# Patient Record
Sex: Male | Born: 1975 | Race: Black or African American | Hispanic: No | Marital: Single | Smoking: Former smoker
Health system: Southern US, Community
[De-identification: ages and names within clinical notes are randomized; demographics above are authoritative.]

## PROBLEM LIST (undated history)

## (undated) DIAGNOSIS — E119 Type 2 diabetes mellitus without complications: Secondary | ICD-10-CM

## (undated) DIAGNOSIS — Z720 Tobacco use: Secondary | ICD-10-CM

## (undated) DIAGNOSIS — I10 Essential (primary) hypertension: Secondary | ICD-10-CM

---

## 2004-10-26 ENCOUNTER — Emergency Department: Payer: Self-pay | Admitting: Emergency Medicine

## 2005-03-01 ENCOUNTER — Emergency Department: Payer: Self-pay | Admitting: Emergency Medicine

## 2006-02-07 ENCOUNTER — Emergency Department: Payer: Self-pay | Admitting: Emergency Medicine

## 2006-06-11 ENCOUNTER — Emergency Department: Payer: Self-pay | Admitting: Internal Medicine

## 2007-09-24 IMAGING — CR DG CHEST 2V
1 series · 3 of 3 positions shown · non-contrast
Comparison: none

REASON FOR EXAM: Cough
COMMENTS:

PROCEDURE:     DXR - DXR CHEST PA (OR AP) AND LATERAL  - [DATE] [DATE]
RESULT:     The lung fields are clear. The heart, mediastinal and osseous
structures show no significant abnormalities.

[Series 1: view not recorded · 0.17mm/px · 3 of 3 slices shown]
[im 1/3]
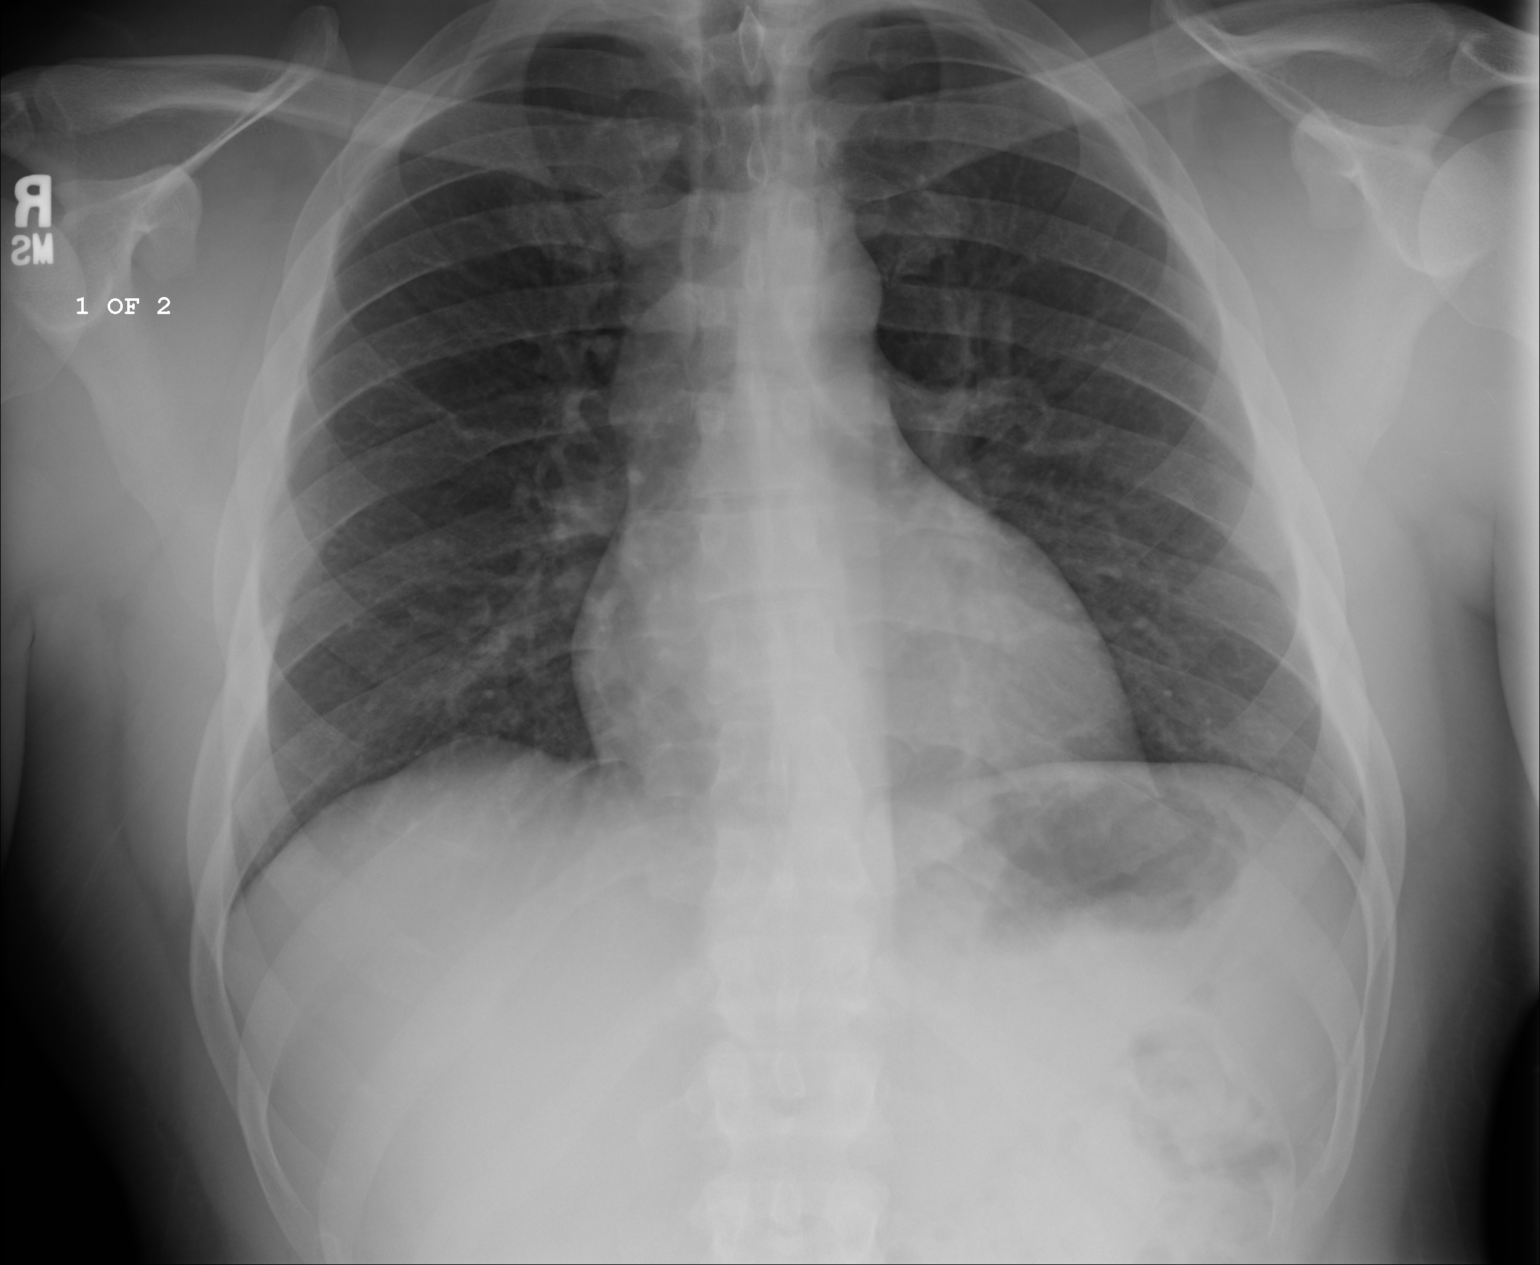
[im 2/3]
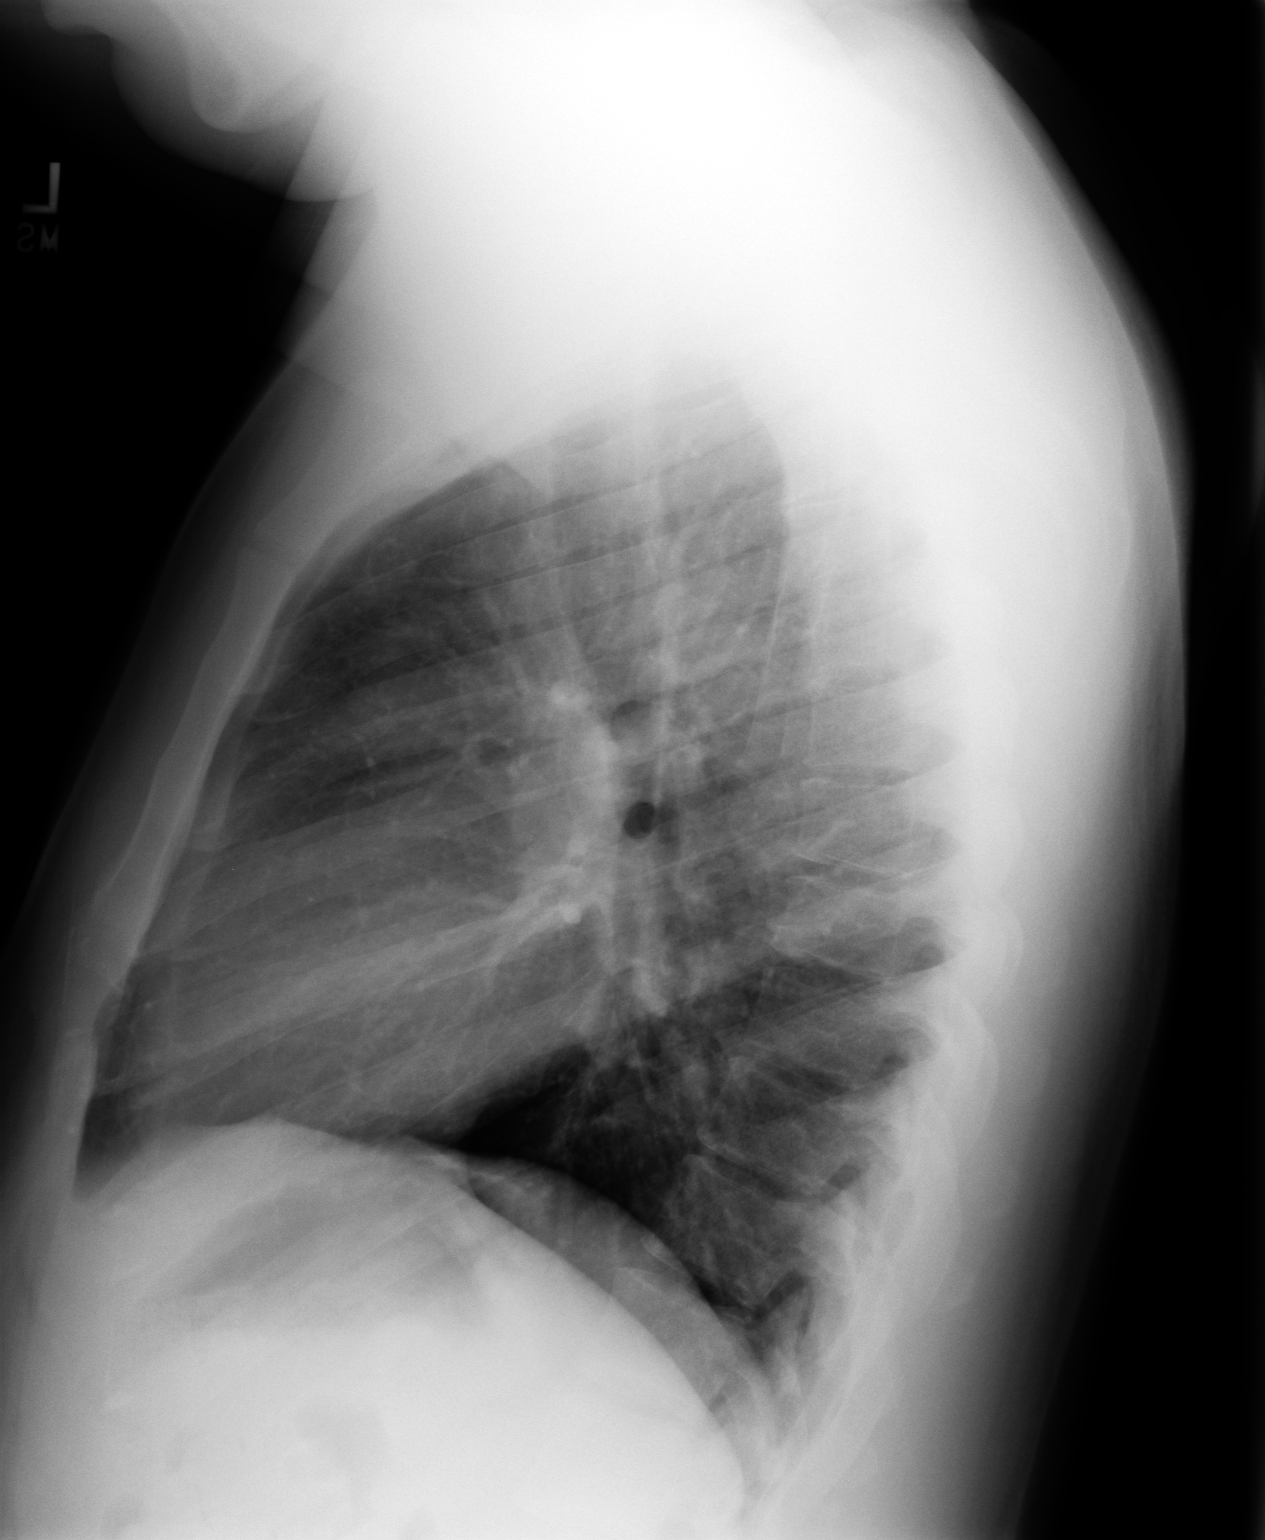
[im 3/3]
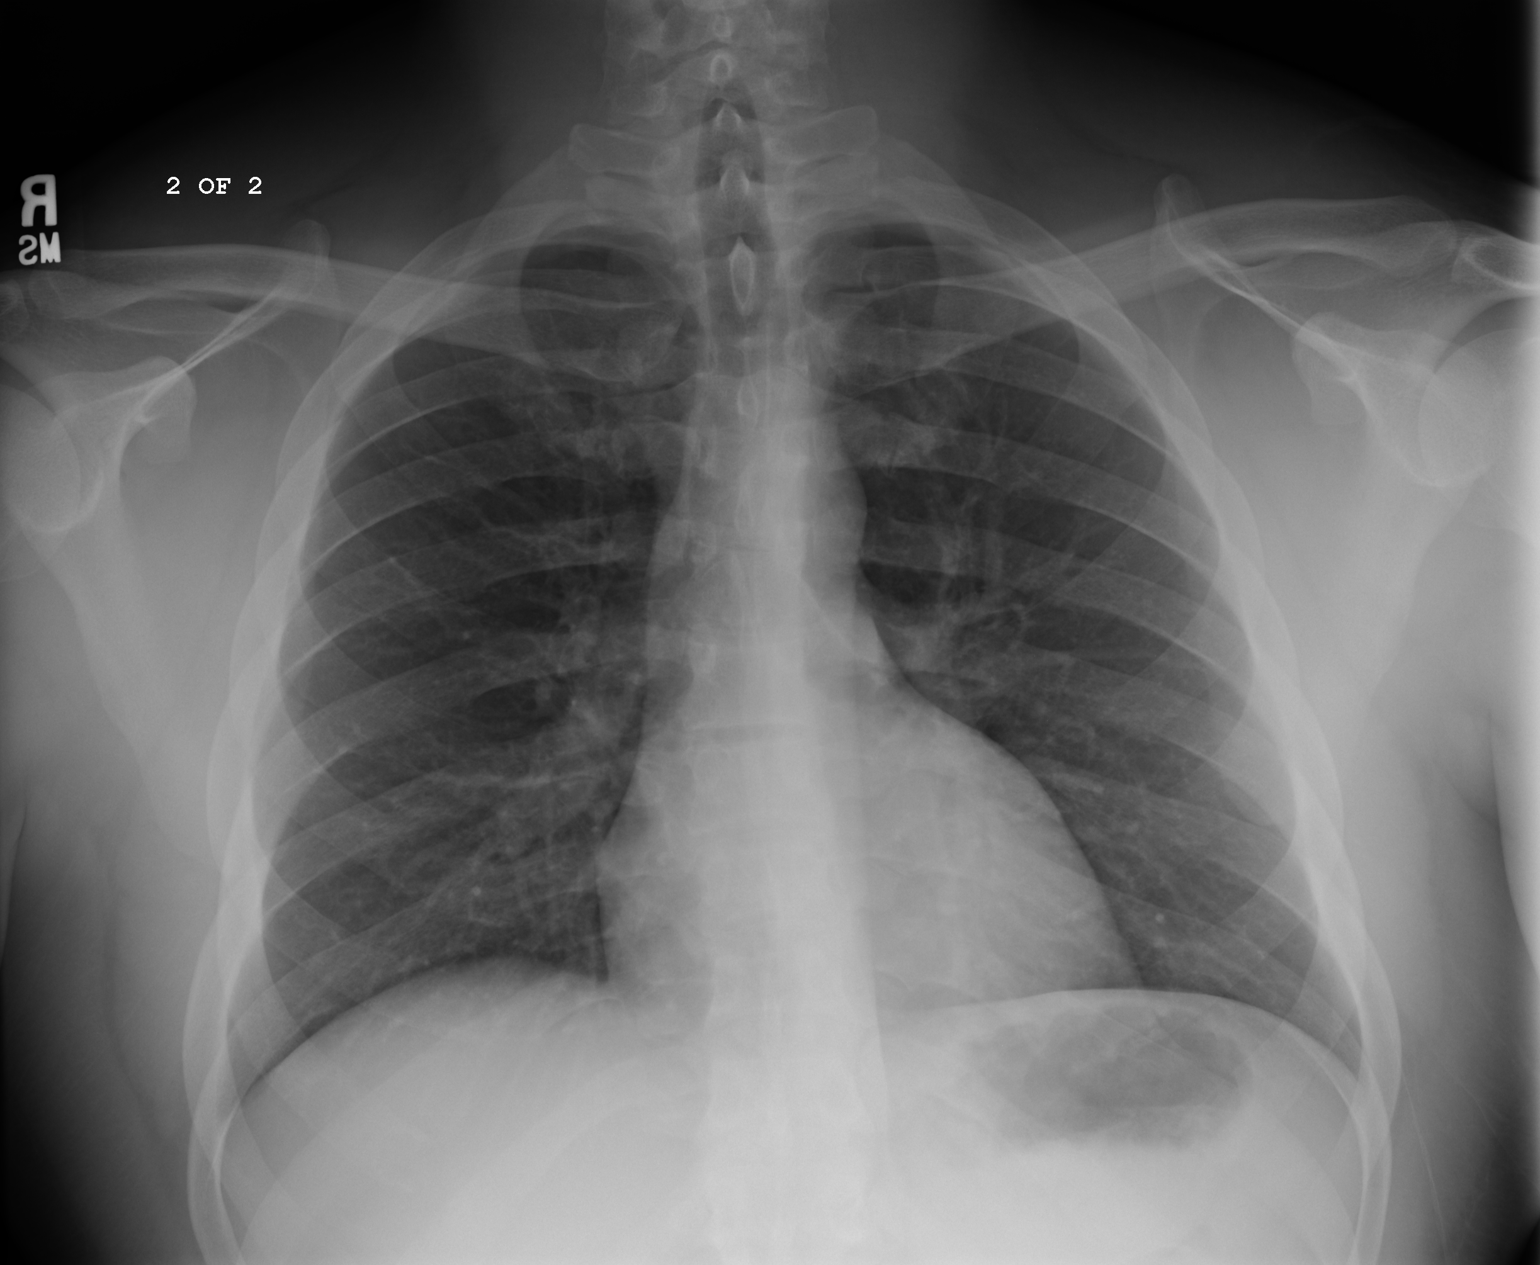

[3 of 3 positions shown; findings below may reference images not displayed]

IMPRESSION: No significant abnormalities are noted.

## 2007-10-01 ENCOUNTER — Emergency Department: Payer: Self-pay | Admitting: Emergency Medicine

## 2008-02-11 ENCOUNTER — Emergency Department: Payer: Self-pay | Admitting: Emergency Medicine

## 2009-03-09 ENCOUNTER — Emergency Department: Payer: Self-pay | Admitting: Emergency Medicine

## 2009-08-24 ENCOUNTER — Emergency Department: Payer: Self-pay | Admitting: Emergency Medicine

## 2010-09-29 ENCOUNTER — Emergency Department: Payer: Self-pay | Admitting: Emergency Medicine

## 2011-09-25 ENCOUNTER — Emergency Department: Payer: Self-pay | Admitting: Emergency Medicine

## 2012-11-25 ENCOUNTER — Emergency Department: Payer: Self-pay | Admitting: Emergency Medicine

## 2013-03-27 ENCOUNTER — Emergency Department: Payer: Self-pay | Admitting: Emergency Medicine

## 2013-03-27 IMAGING — CR RIGHT HAND - COMPLETE 3+ VIEW
1 series · 3 of 3 positions shown · non-contrast
Comparison: none

REASON FOR EXAM: trauma
COMMENTS:   May transport without cardiac monitor

PROCEDURE:     DXR - DXR HAND RT COMPLETE W/OBLIQUES  - [DATE] [DATE]
RESULT:     History: Trauma.
Comparison Study: No prior.

[Series 1: x hand pa right · 0.14mm/px · 3 of 3 slices shown]
[im 1/3]
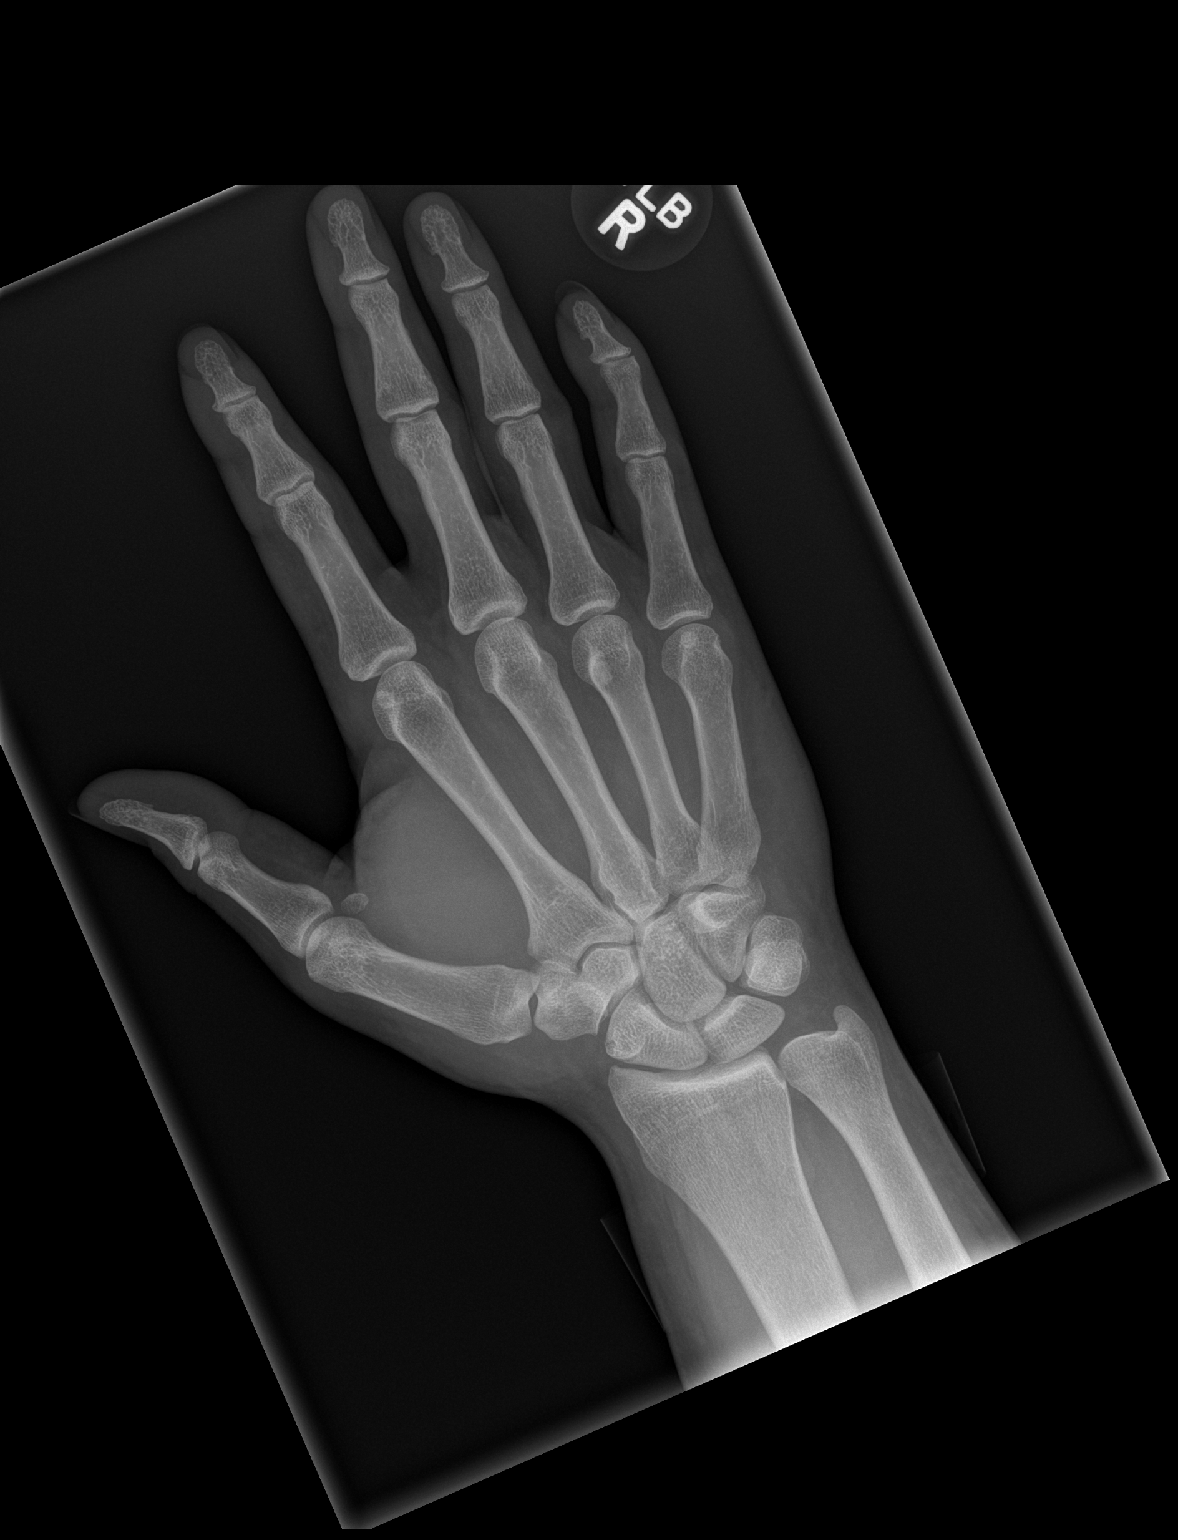
[im 2/3]
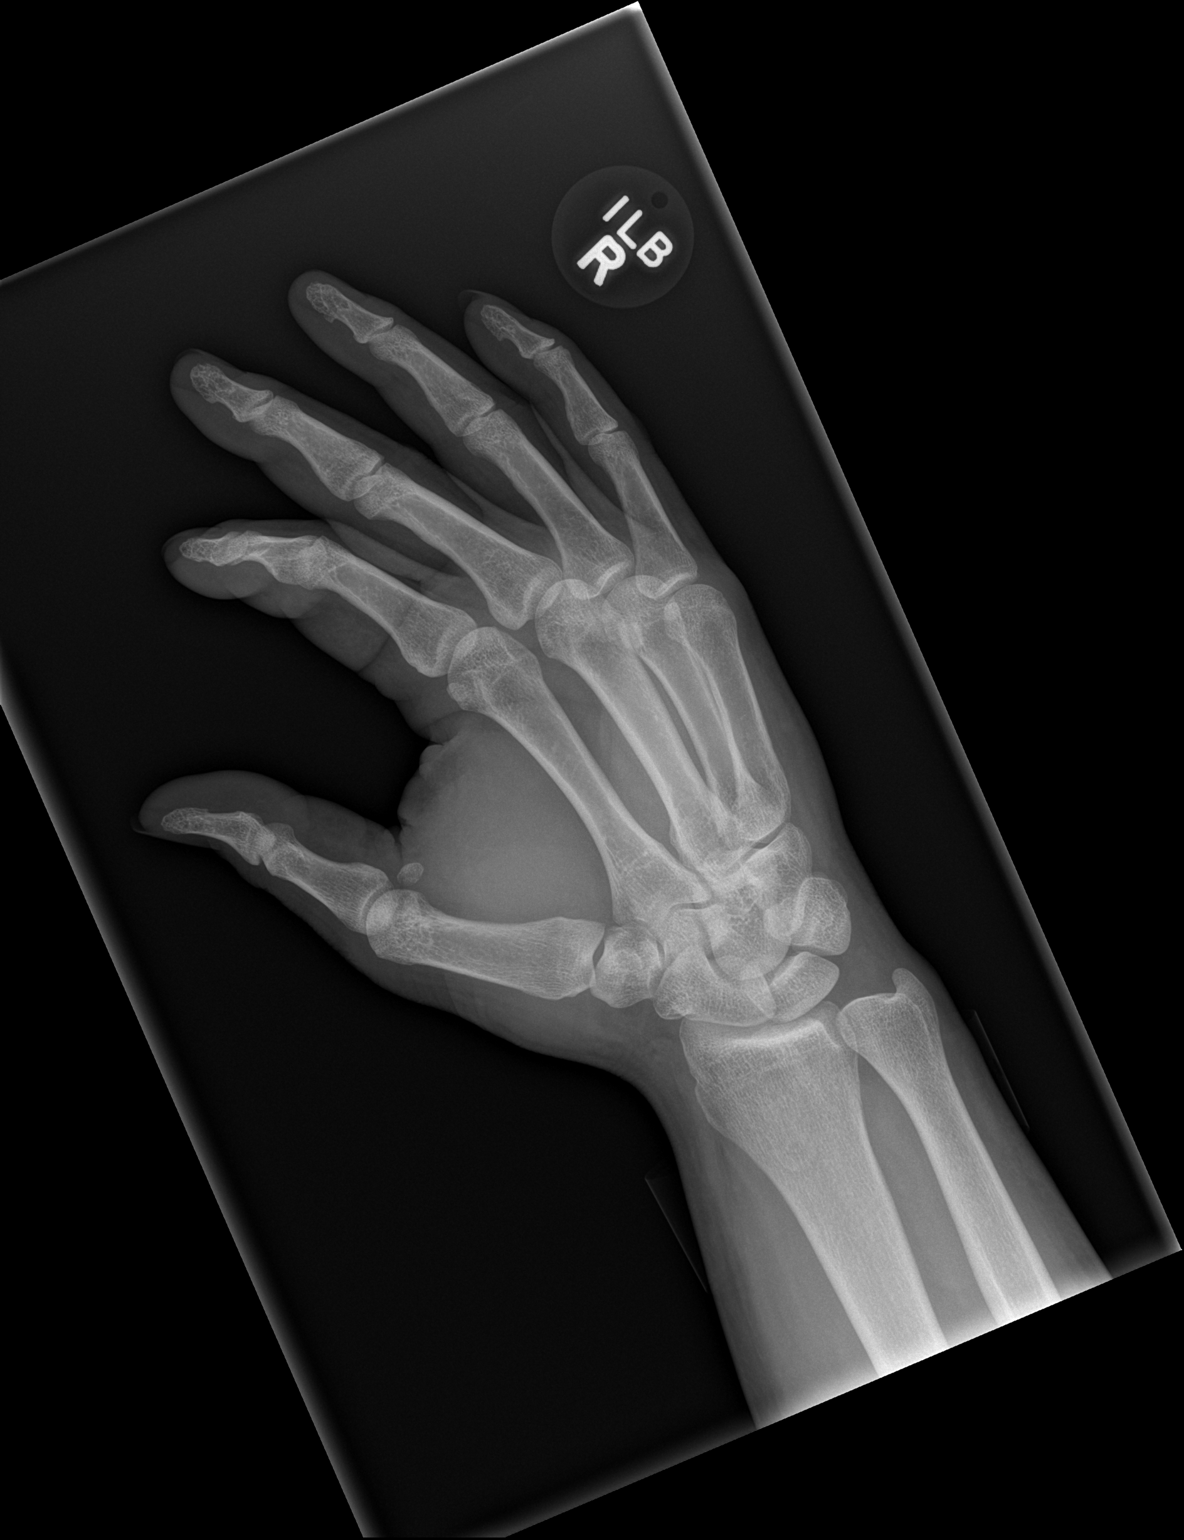
[im 3/3]
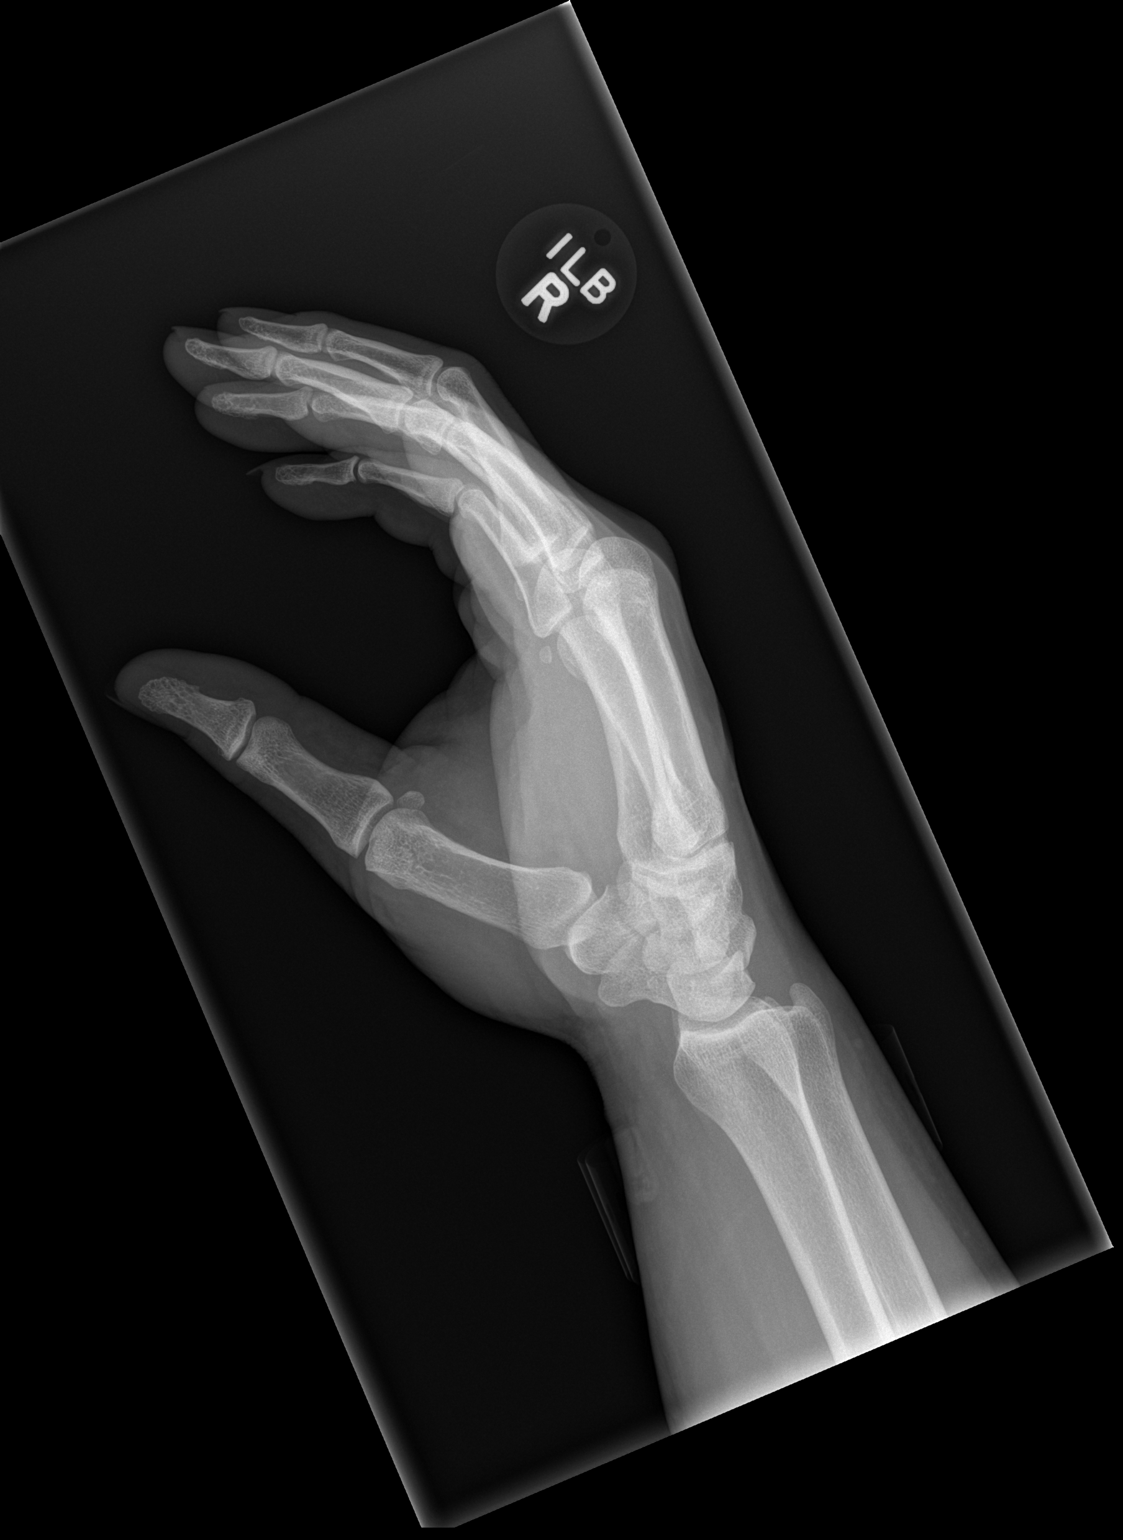

[3 of 3 positions shown; findings below may reference images not displayed]

FINDINGS: No acute bony or joint abnormality.
IMPRESSION: No acute abnormality

## 2013-03-27 IMAGING — CR LEFT WRIST - COMPLETE 3+ VIEW
1 series · 3 of 3 positions shown · non-contrast
Comparison: none

REASON FOR EXAM: wrist pain s/p injury
COMMENTS:

PROCEDURE:     DXR - DXR WRIST LT COMP WITH OBLIQUES  - [DATE] [DATE]
RESULT:     History: Trauma.
Comparison Study: No prior.

[Series 1: x wrist pa left · 0.14mm/px · 3 of 3 slices shown]
[im 1/3]
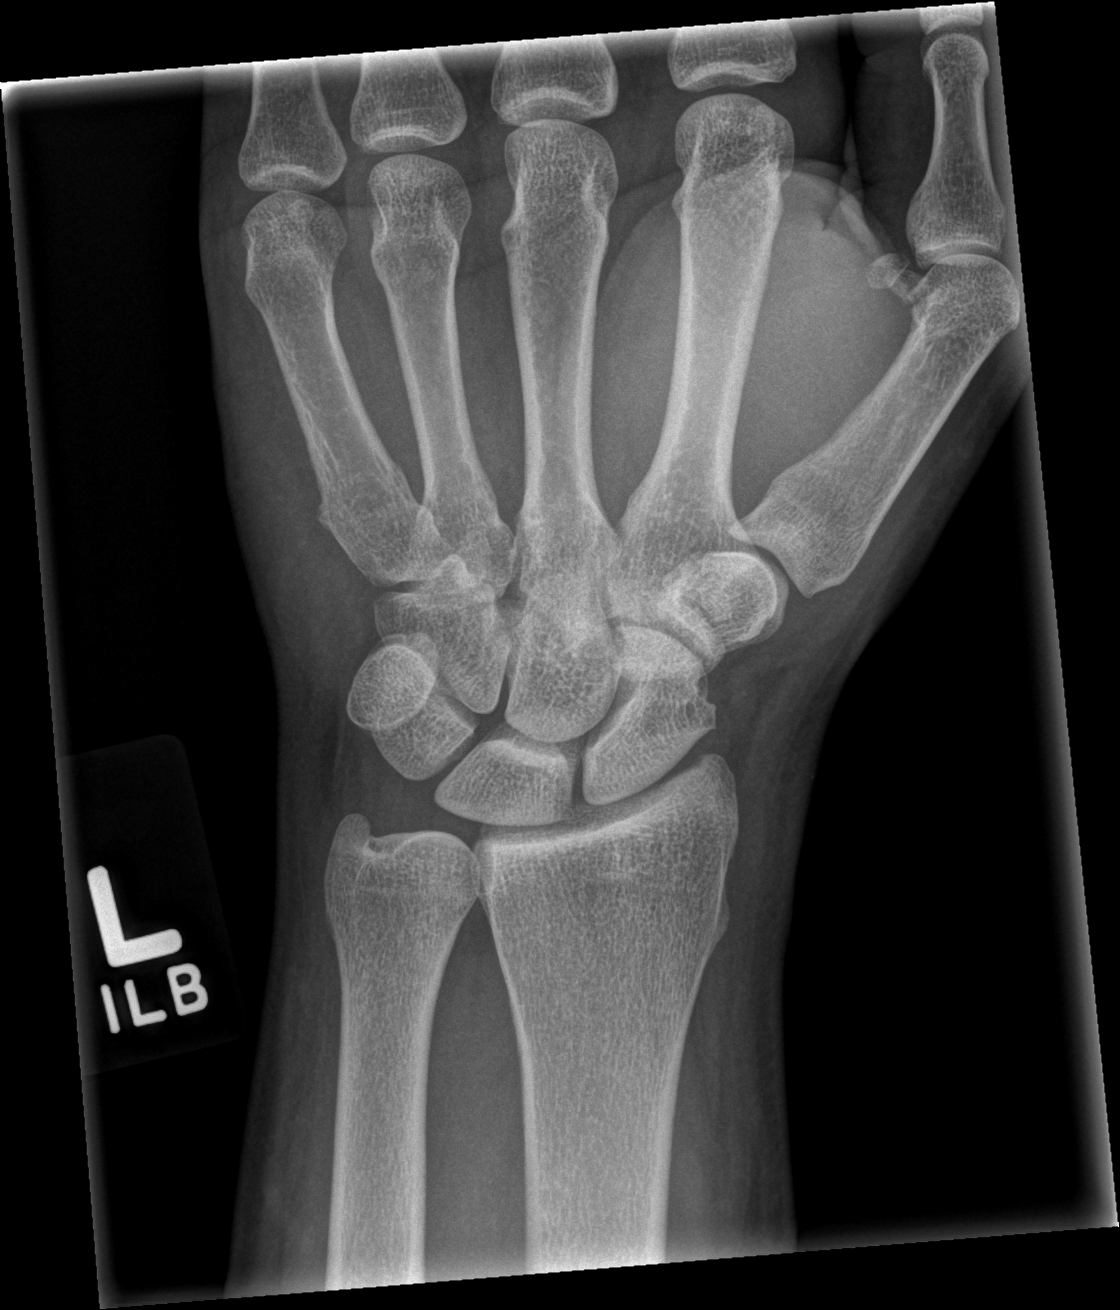
[im 2/3]
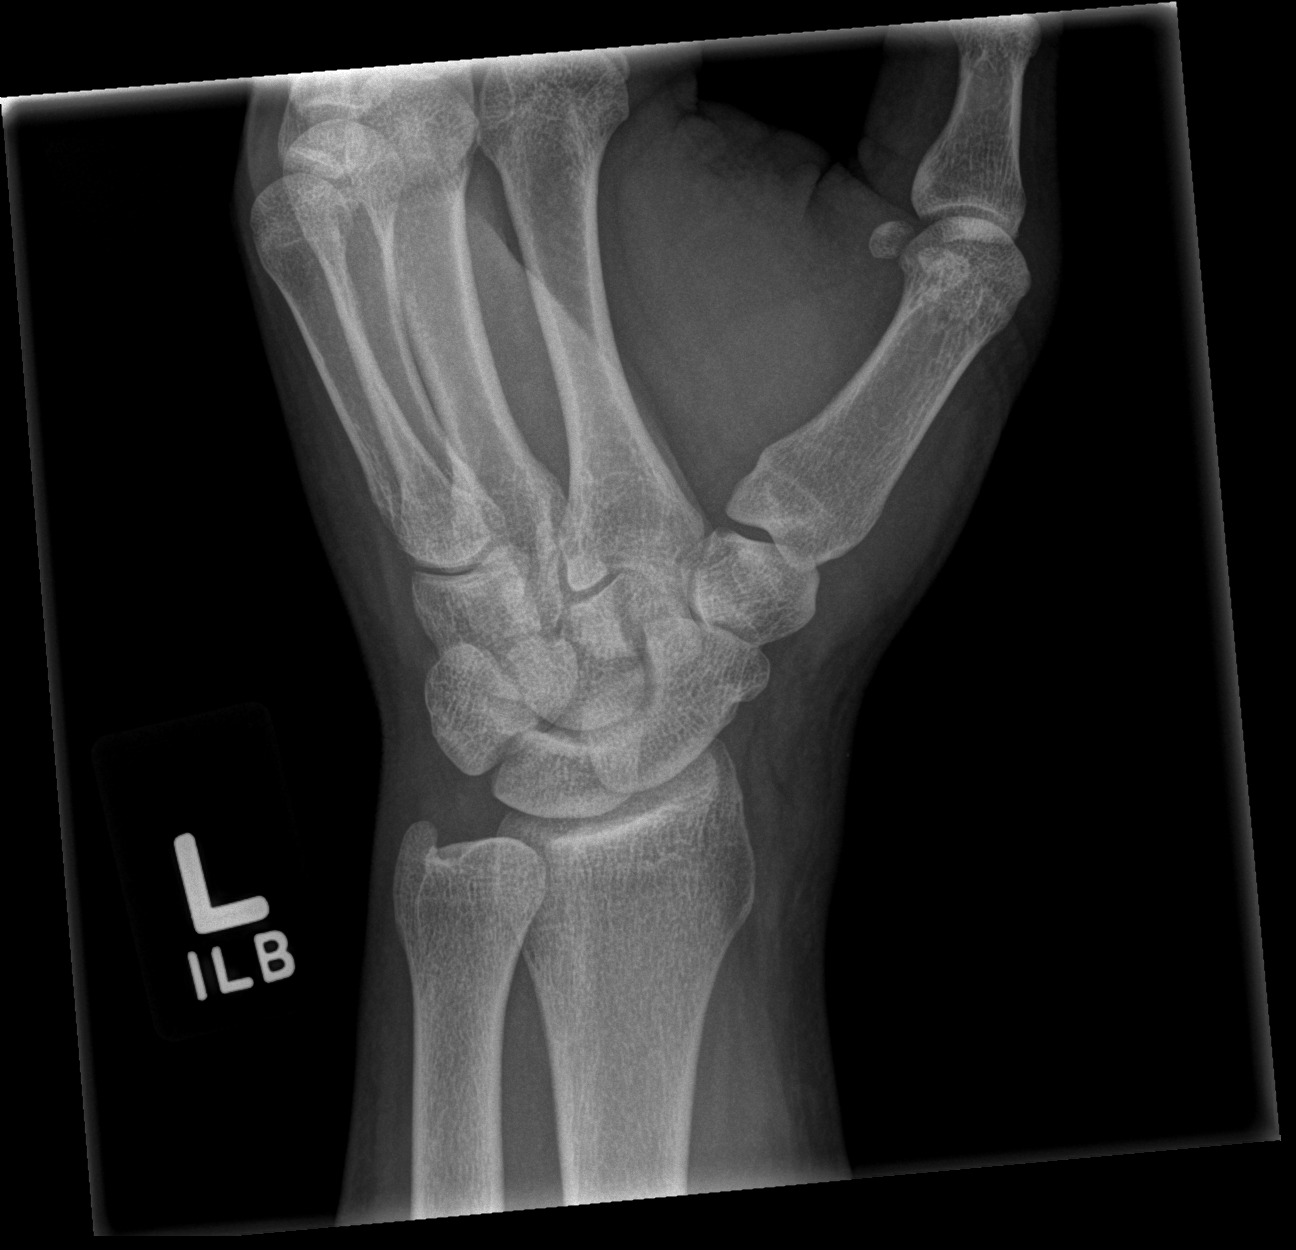
[im 3/3]
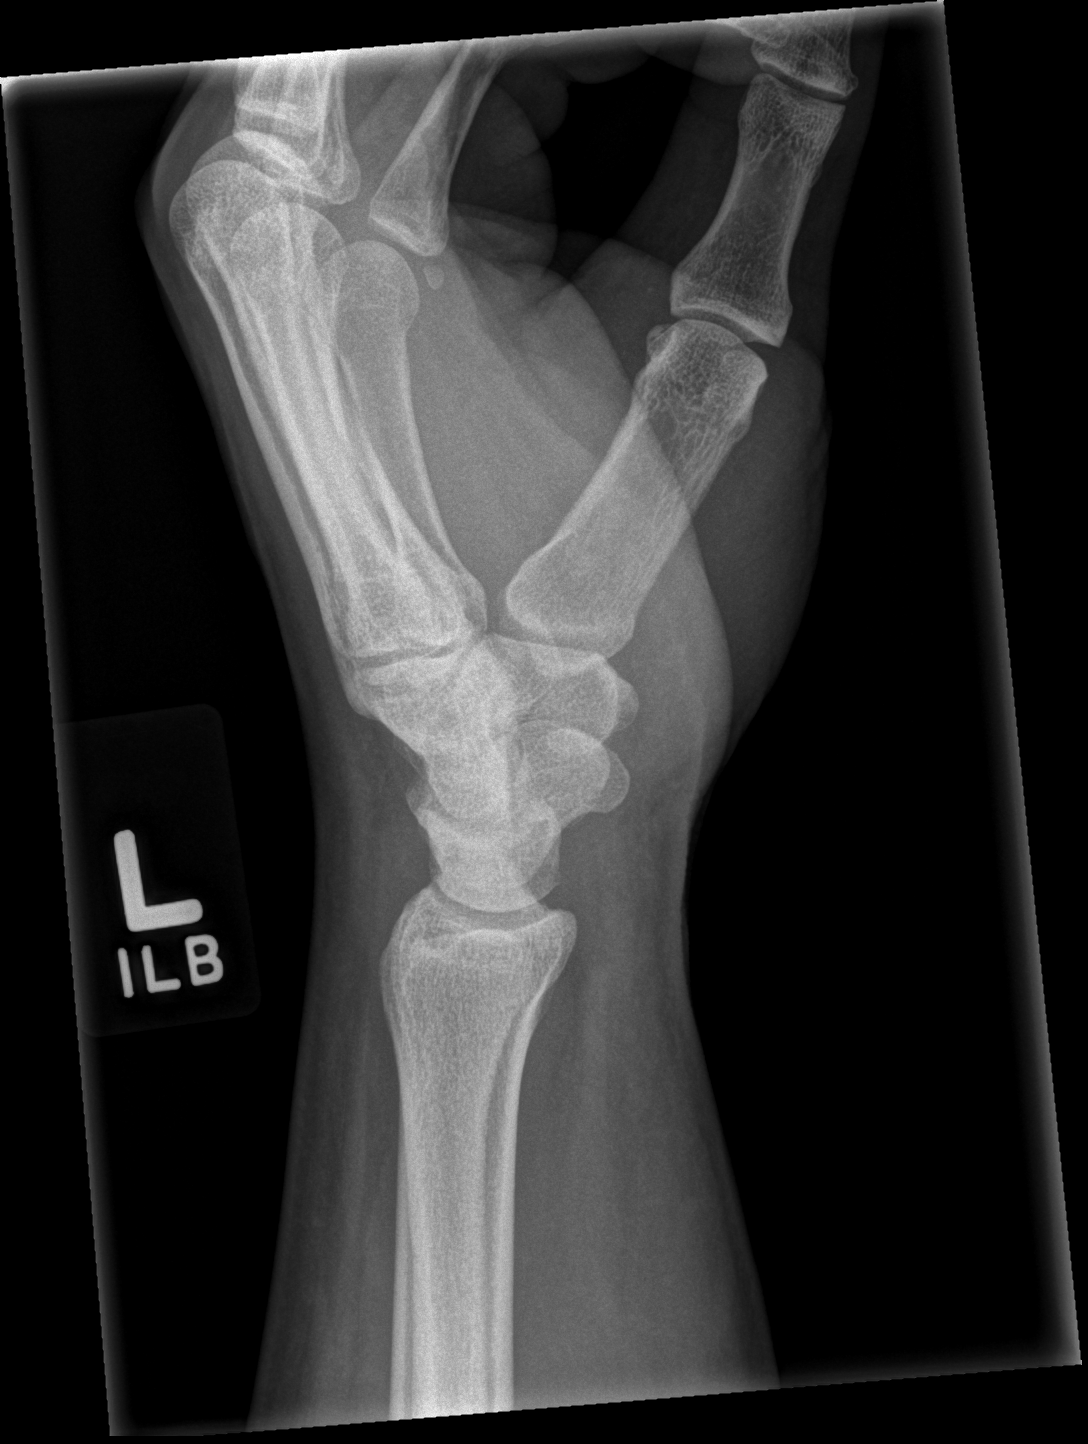

[3 of 3 positions shown; findings below may reference images not displayed]

FINDINGS: No acute soft tissue or bony abnormality. No evidence of fracture.
IMPRESSION: No acute abnormality.

## 2013-03-27 IMAGING — CR DG HAND COMPLETE 3+V*L*
1 series · 3 of 3 positions shown · non-contrast
Comparison: none

REASON FOR EXAM: trauma
COMMENTS:   May transport without cardiac monitor

PROCEDURE:     DXR - DXR HAND LT COMPLETE  W/OBLIQUES  - [DATE] [DATE]
RESULT:     History: Trauma.
Comparison Study: No prior.

[Series 1: x hand pa left · 0.14mm/px · 3 of 3 slices shown]
[im 1/3]
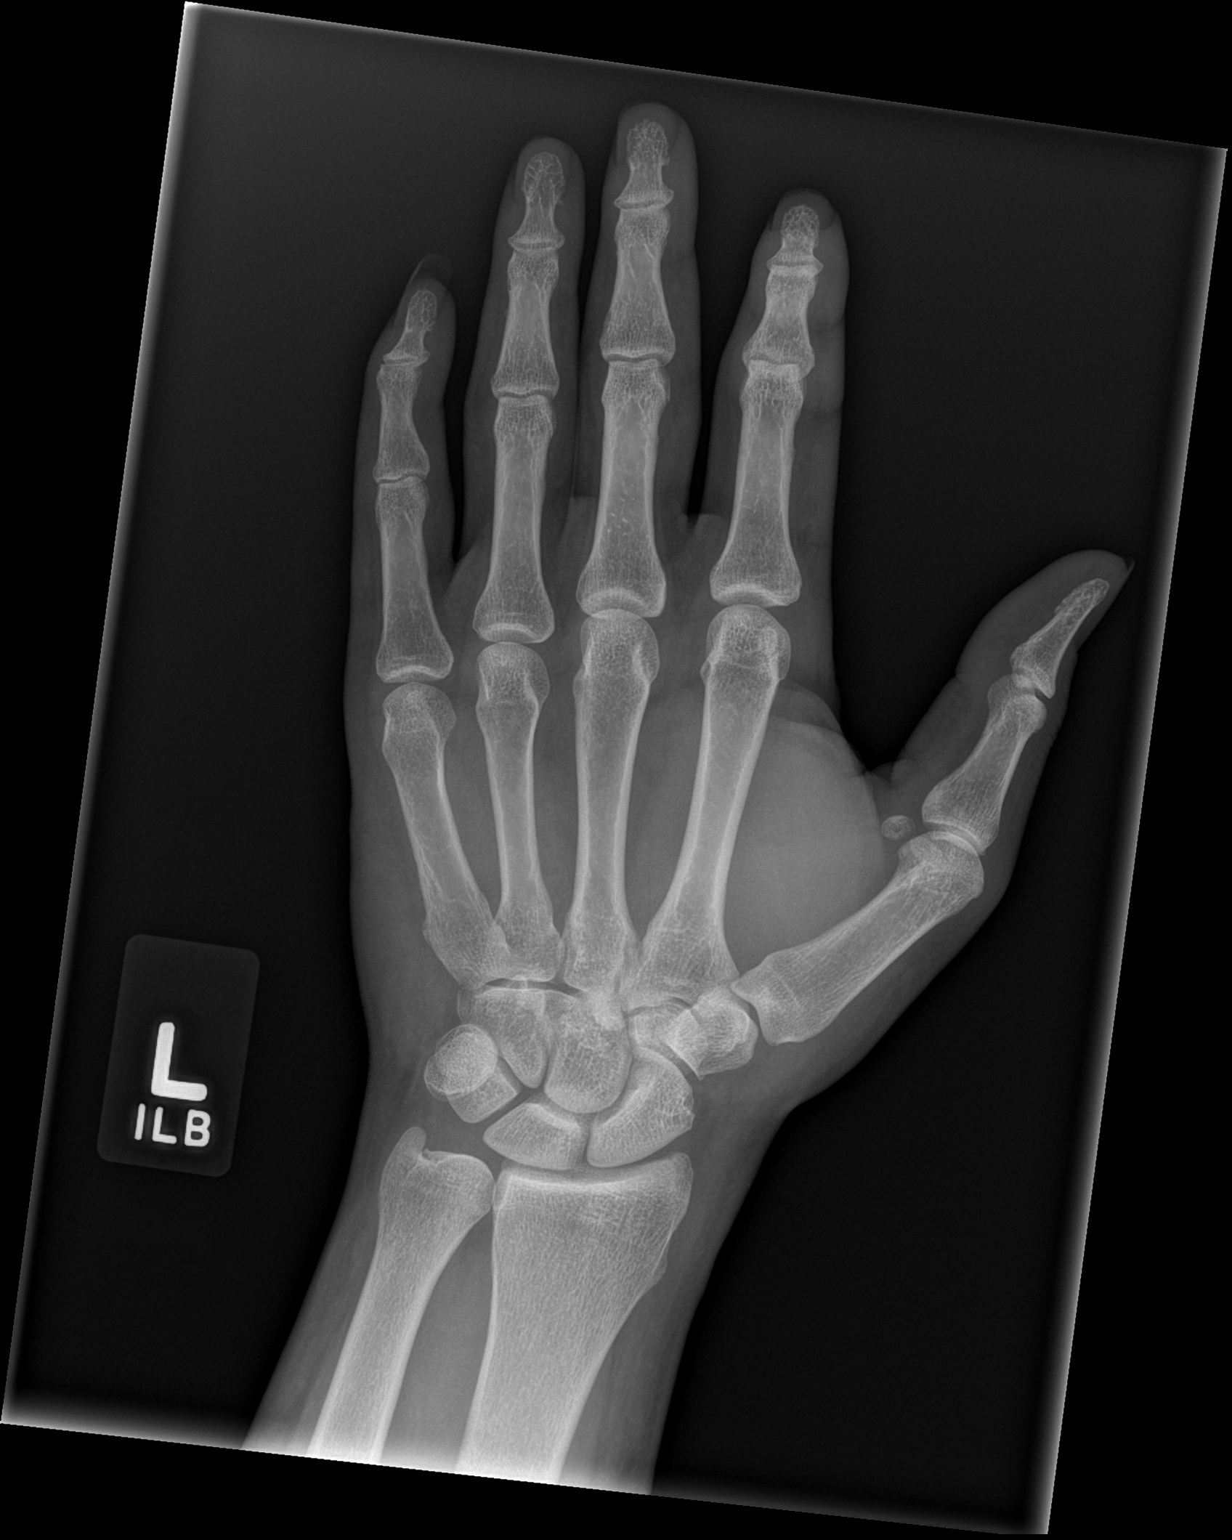
[im 2/3]
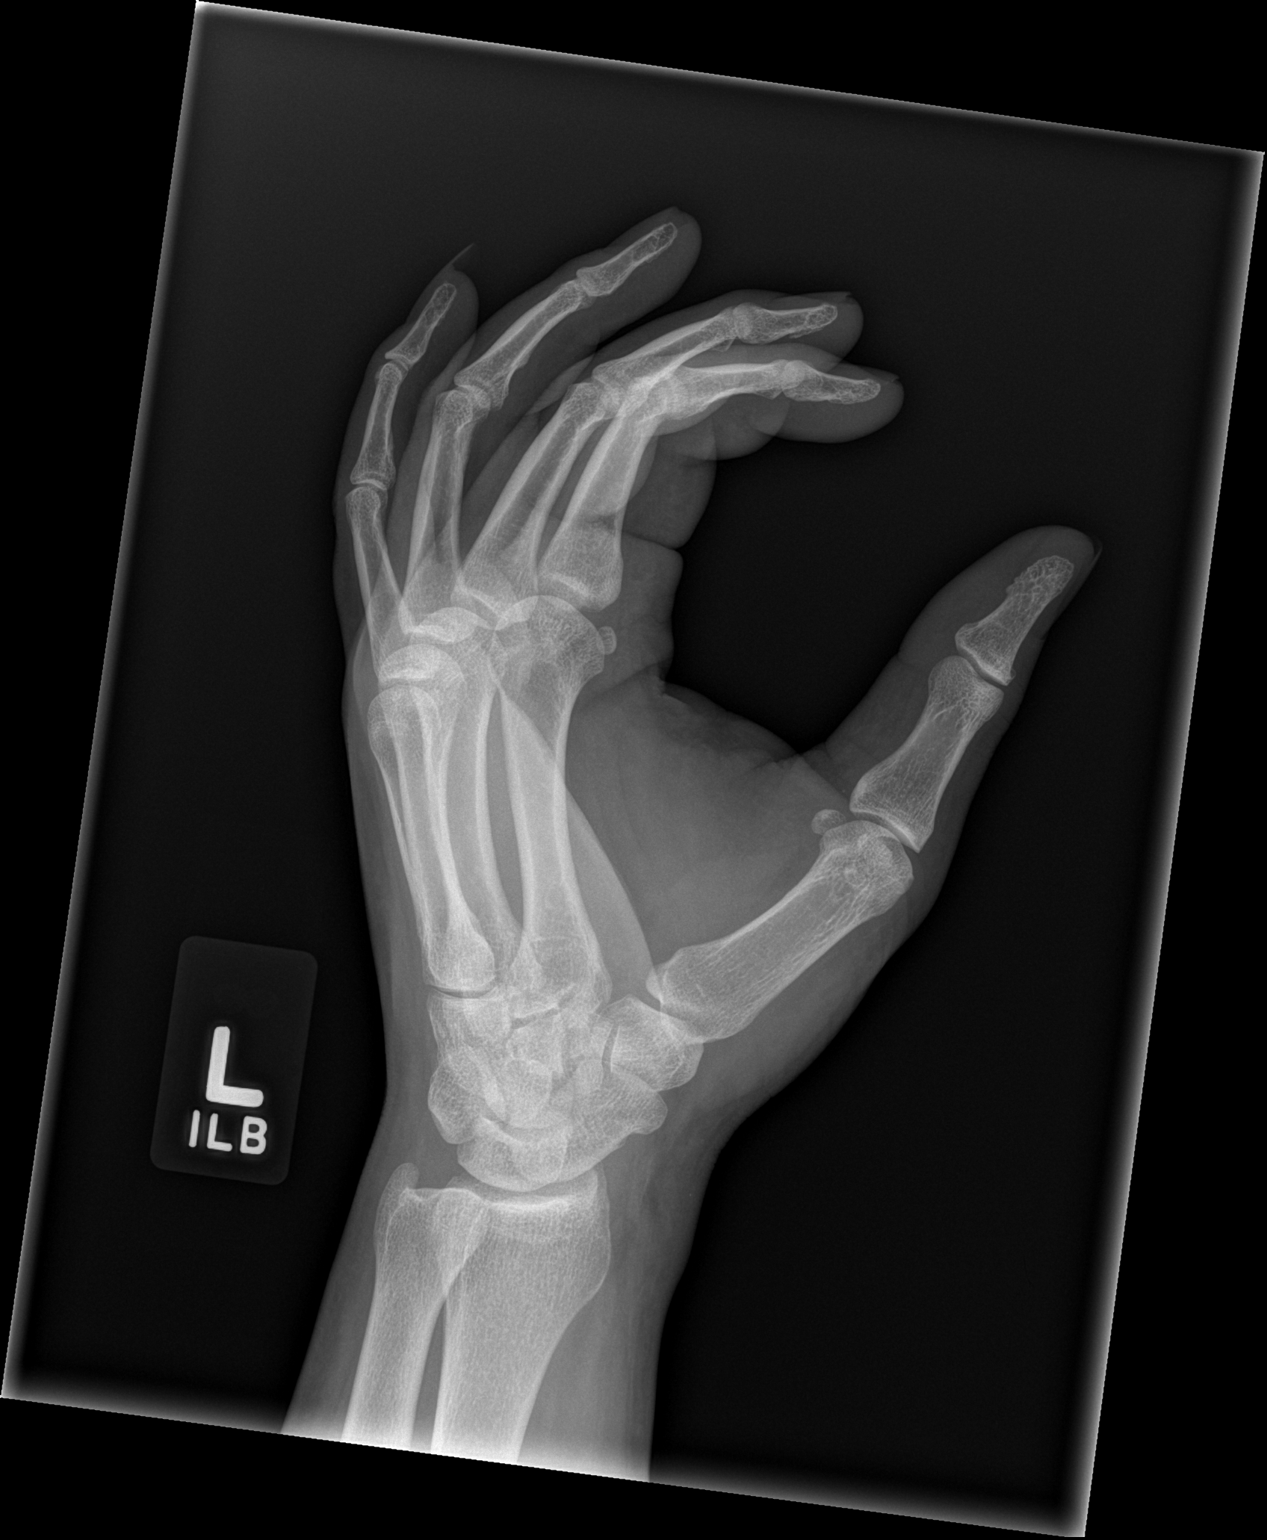
[im 3/3]
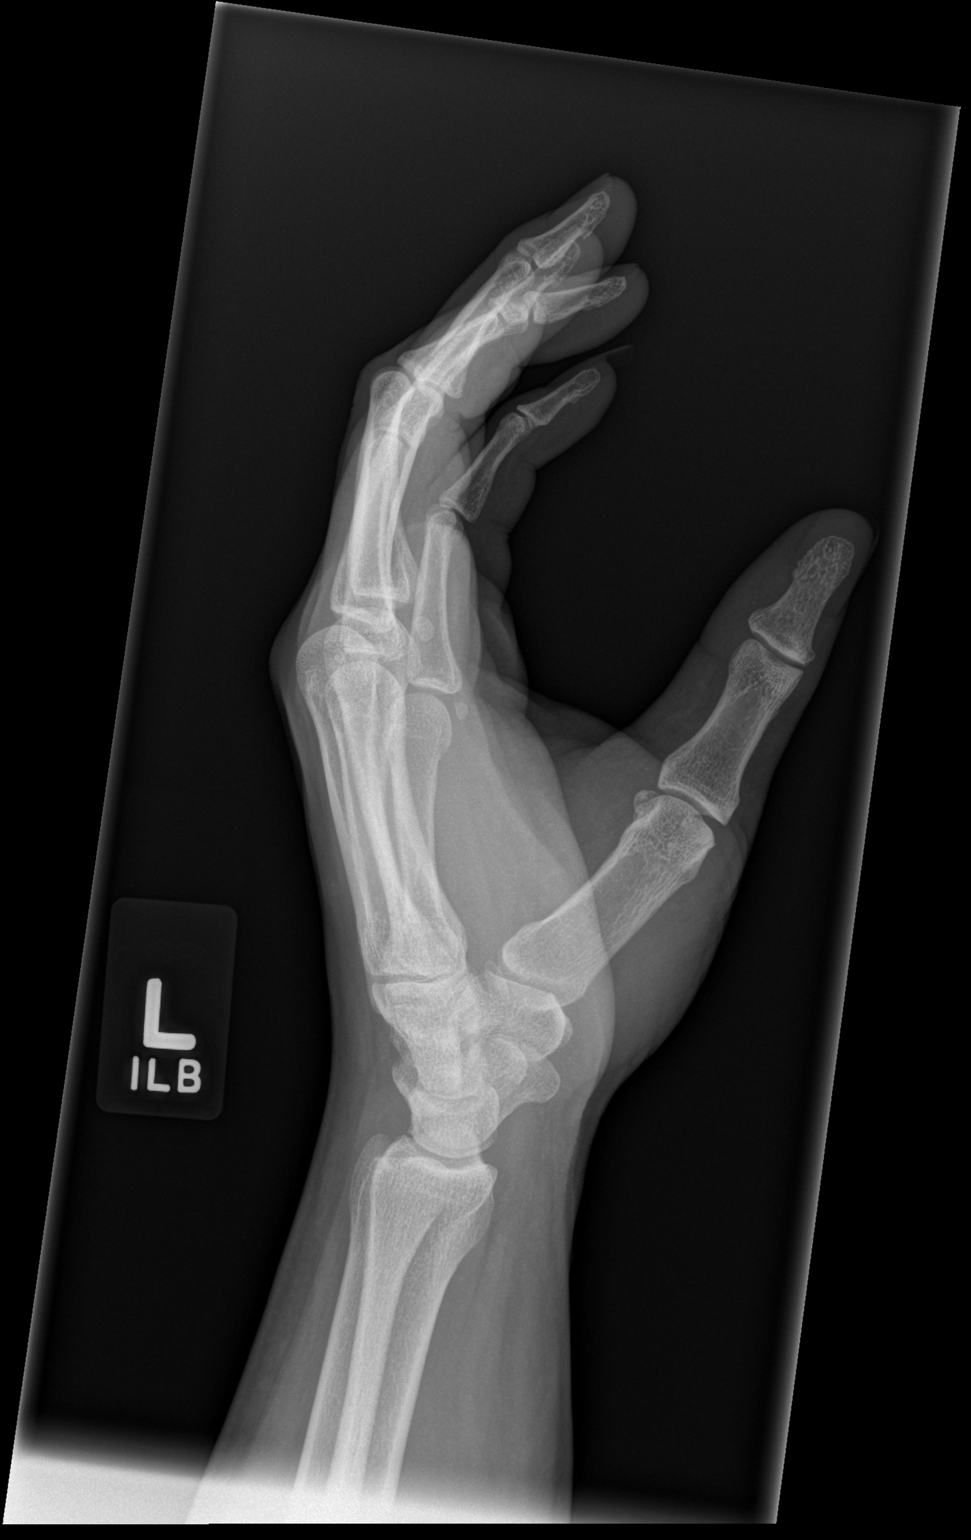

[3 of 3 positions shown; findings below may reference images not displayed]

FINDINGS: No acute bony or joint abnormality. No evidence of fracture or
dislocation.
IMPRESSION: No acute abnormality.

## 2013-06-05 ENCOUNTER — Emergency Department: Payer: Self-pay | Admitting: Emergency Medicine

## 2013-06-05 LAB — CBC
HGB: 12.7 g/dL — ABNORMAL LOW (ref 13.0–18.0)
Platelet: 191 10*3/uL (ref 150–440)
RBC: 5.03 10*6/uL (ref 4.40–5.90)

## 2013-06-05 LAB — COMPREHENSIVE METABOLIC PANEL
Albumin: 4 g/dL (ref 3.4–5.0)
Alkaline Phosphatase: 83 U/L (ref 50–136)
Anion Gap: 5 — ABNORMAL LOW (ref 7–16)
BUN: 7 mg/dL (ref 7–18)
Bilirubin,Total: 0.8 mg/dL (ref 0.2–1.0)
Chloride: 108 mmol/L — ABNORMAL HIGH (ref 98–107)
EGFR (African American): >60
EGFR (Non-African Amer.): >60
Potassium: 3.7 mmol/L (ref 3.5–5.1)
SGOT(AST): 18 U/L (ref 15–37)
SGPT (ALT): 35 U/L (ref 12–78)
Sodium: 139 mmol/L (ref 136–145)
Total Protein: 7.6 g/dL (ref 6.4–8.2)

## 2013-06-05 LAB — URINALYSIS, COMPLETE
Bacteria: NONE SEEN
Bilirubin,UR: NEGATIVE
Ketone: NEGATIVE
Ph: 7 (ref 4.5–8.0)
RBC,UR: 2 /HPF (ref 0–5)
Specific Gravity: 1.028 (ref 1.003–1.030)
Squamous Epithelial: 1

## 2013-06-05 LAB — TROPONIN I: Troponin-I: <0.02 ng/mL

## 2013-06-05 IMAGING — CR DG CHEST 2V
1 series · 2 of 2 positions shown · non-contrast
Comparison: none

REASON FOR EXAM: cough
COMMENTS:

[Series 1: w chest pa · 0.14mm/px · 2 of 2 slices shown]
[im 1/2]
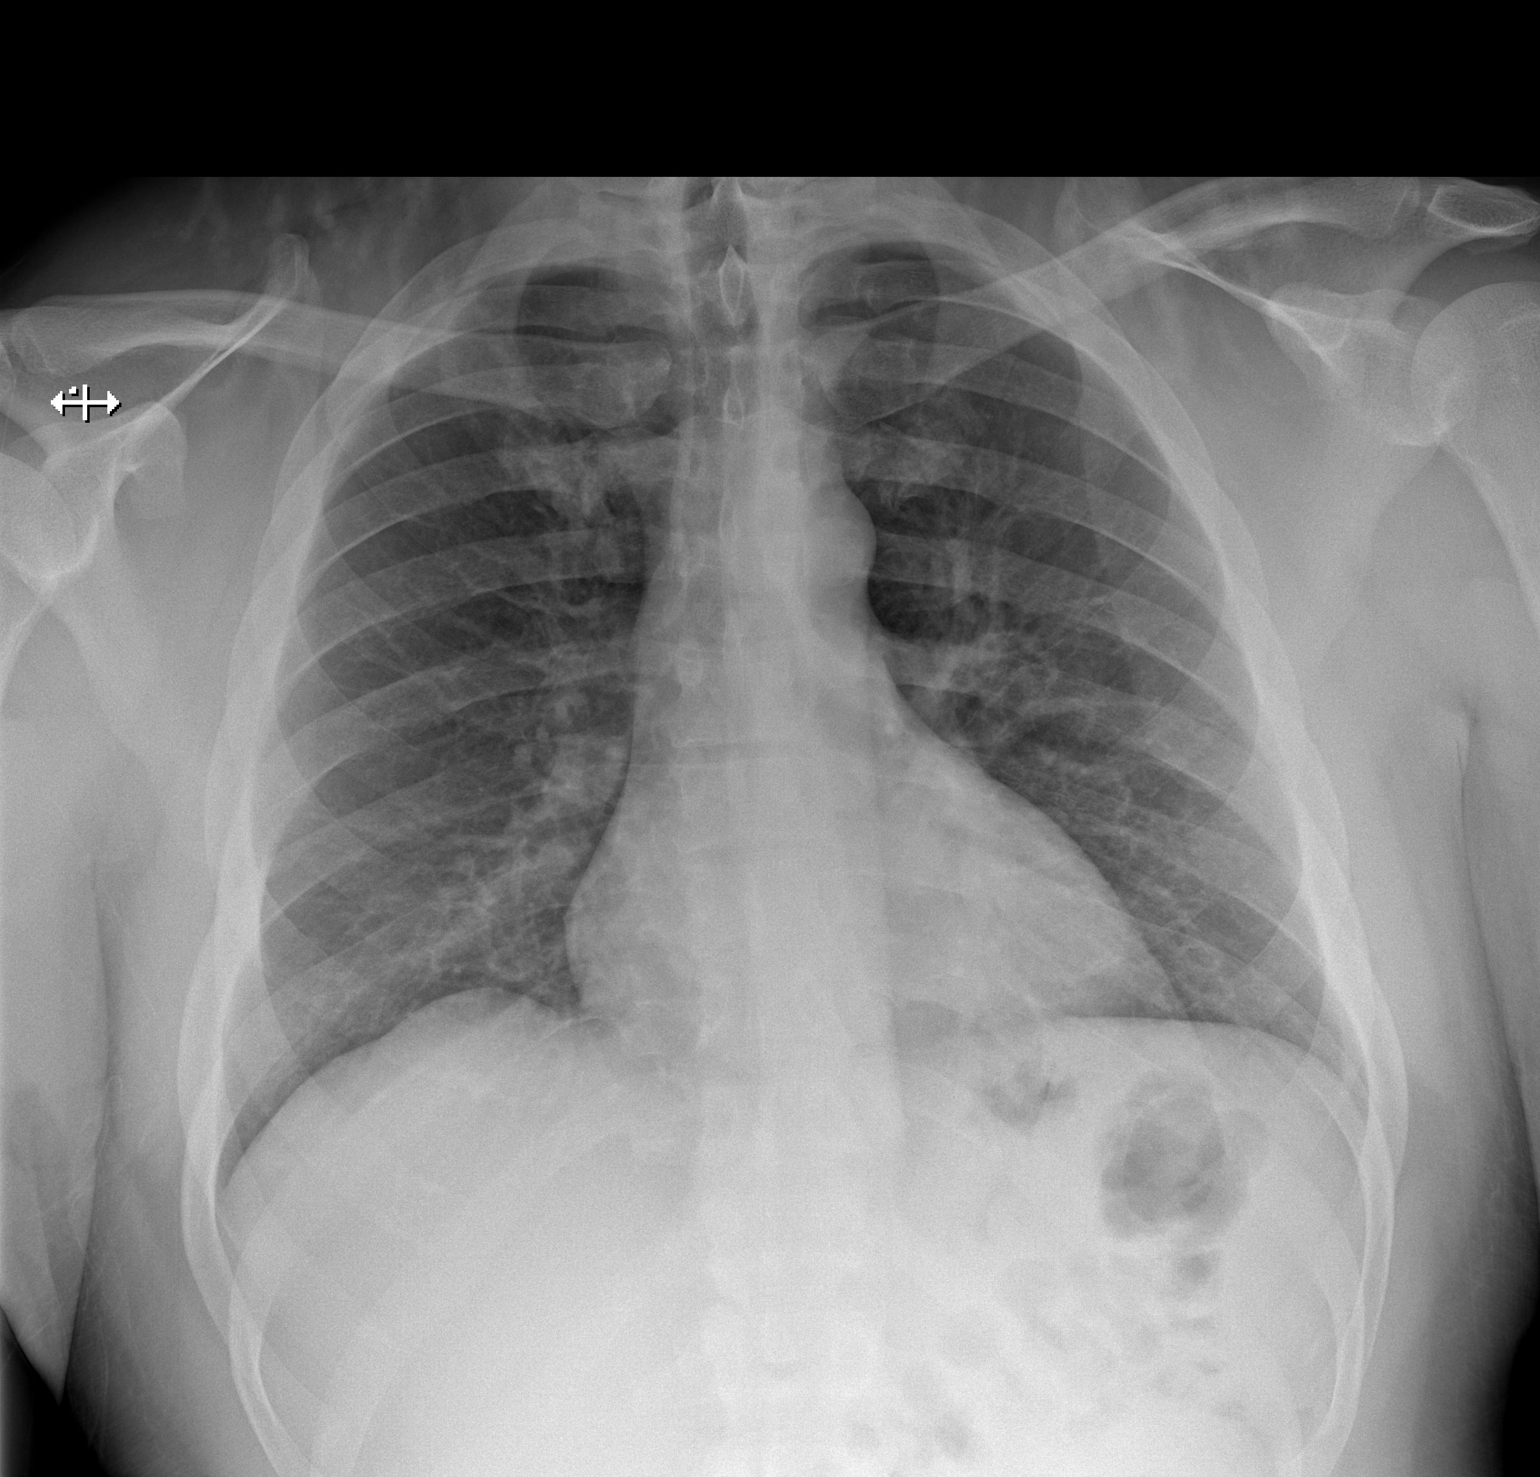
[im 2/2]
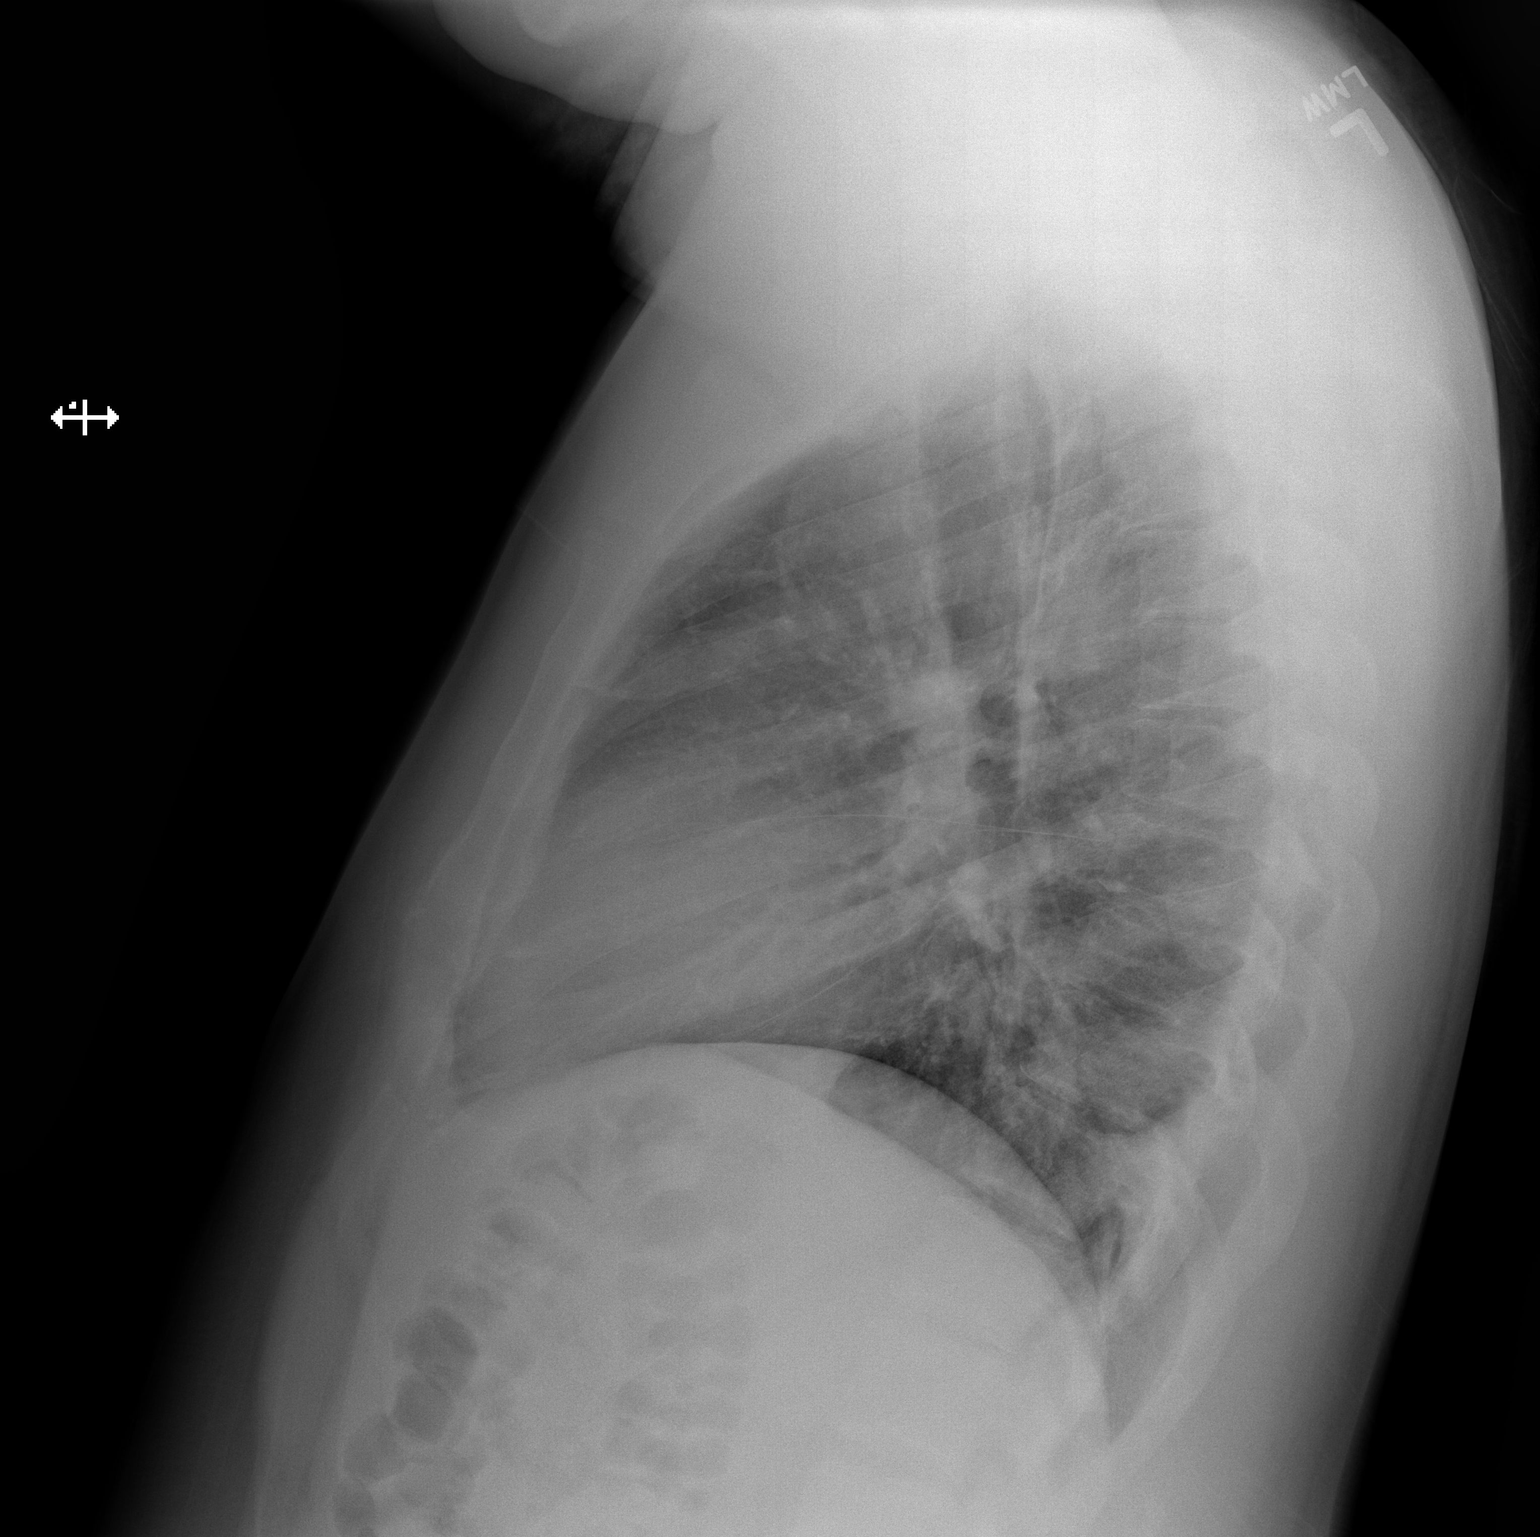

[2 of 2 positions shown; findings below may reference images not displayed]

PROCEDURE:     DXR - DXR CHEST PA (OR AP) AND LATERAL  - [DATE]  [DATE]

RESULT:     There is artifact of the patient's shoulders which could be from
the patient's clothing or from dreadlocks. The lungs are clear. The heart
and pulmonary vessels are normal. The bony and mediastinal structures are
unremarkable. There is no effusion. There is no pneumothorax or evidence of
congestive failure.
IMPRESSION: No acute cardiopulmonary disease.

[REDACTED]

## 2014-12-22 ENCOUNTER — Emergency Department: Payer: Self-pay | Admitting: Emergency Medicine

## 2015-04-16 ENCOUNTER — Emergency Department: Admit: 2015-04-16 | Disposition: A | Discharge status: Home / Self Care | Payer: Self-pay | Admitting: Student

## 2015-04-16 LAB — URINALYSIS, COMPLETE
BACTERIA: NONE SEEN
Bilirubin,UR: NEGATIVE
Blood: NEGATIVE
GLUCOSE, UR: NEGATIVE mg/dL (ref 0–75)
Ketone: NEGATIVE
Leukocyte Esterase: NEGATIVE
NITRITE: NEGATIVE
Ph: 6 (ref 4.5–8.0)
Protein: NEGATIVE
SPECIFIC GRAVITY: 1.025 (ref 1.003–1.030)

## 2015-04-16 LAB — COMPREHENSIVE METABOLIC PANEL
ALK PHOS: 73 U/L
ALT: 18 U/L
AST: 19 U/L
Albumin: 4.3 g/dL
Anion Gap: 5 — ABNORMAL LOW (ref 7–16)
BUN: 10 mg/dL
Bilirubin,Total: 0.7 mg/dL
CREATININE: 1.03 mg/dL
Calcium, Total: 8.7 mg/dL — ABNORMAL LOW
Chloride: 109 mmol/L
Co2: 25 mmol/L
EGFR (African American): >60
EGFR (Non-African Amer.): >60
GLUCOSE: 129 mg/dL — AB
Potassium: 3.7 mmol/L
Sodium: 139 mmol/L
TOTAL PROTEIN: 7.3 g/dL

## 2015-04-16 LAB — CBC WITH DIFFERENTIAL/PLATELET
BASOS ABS: 0 10*3/uL (ref 0.0–0.1)
Basophil %: 0.7 %
Eosinophil #: 0.3 10*3/uL (ref 0.0–0.7)
Eosinophil %: 4.7 %
HCT: 41.8 % (ref 40.0–52.0)
HGB: 12.9 g/dL — ABNORMAL LOW (ref 13.0–18.0)
LYMPHS ABS: 2.8 10*3/uL (ref 1.0–3.6)
Lymphocyte %: 41.1 %
MCH: 25 pg — AB (ref 26.0–34.0)
MCHC: 30.7 g/dL — AB (ref 32.0–36.0)
MCV: 81 fL (ref 80–100)
MONO ABS: 0.5 x10 3/mm (ref 0.2–1.0)
MONOS PCT: 7 %
NEUTROS ABS: 3.2 10*3/uL (ref 1.4–6.5)
Neutrophil %: 46.5 %
PLATELETS: 191 10*3/uL (ref 150–440)
RBC: 5.14 10*6/uL (ref 4.40–5.90)
RDW: 13.6 % (ref 11.5–14.5)
WBC: 6.9 10*3/uL (ref 3.8–10.6)

## 2015-07-29 ENCOUNTER — Emergency Department: Payer: Self-pay

## 2015-07-29 ENCOUNTER — Emergency Department
Admission: EM | Admit: 2015-07-29 | Discharge: 2015-07-29 | Disposition: A | Discharge status: Home / Self Care | Payer: Self-pay | Attending: Emergency Medicine | Admitting: Emergency Medicine

## 2015-07-29 ENCOUNTER — Encounter: Payer: Self-pay | Admitting: Emergency Medicine

## 2015-07-29 DIAGNOSIS — M79641 Pain in right hand: Secondary | ICD-10-CM | POA: Insufficient documentation

## 2015-07-29 DIAGNOSIS — R609 Edema, unspecified: Secondary | ICD-10-CM | POA: Insufficient documentation

## 2015-07-29 IMAGING — CR DG HAND COMPLETE 3+V*R*
1 series · 3 of 3 positions shown · non-contrast
Comparison: None.

CLINICAL DATA: Injury pain.  Initial evaluation.

EXAM:
RIGHT HAND - COMPLETE 3+ VIEW

[Series 1: pa · 0.17mm/px · 3 of 3 slices shown]
[im 1/3]
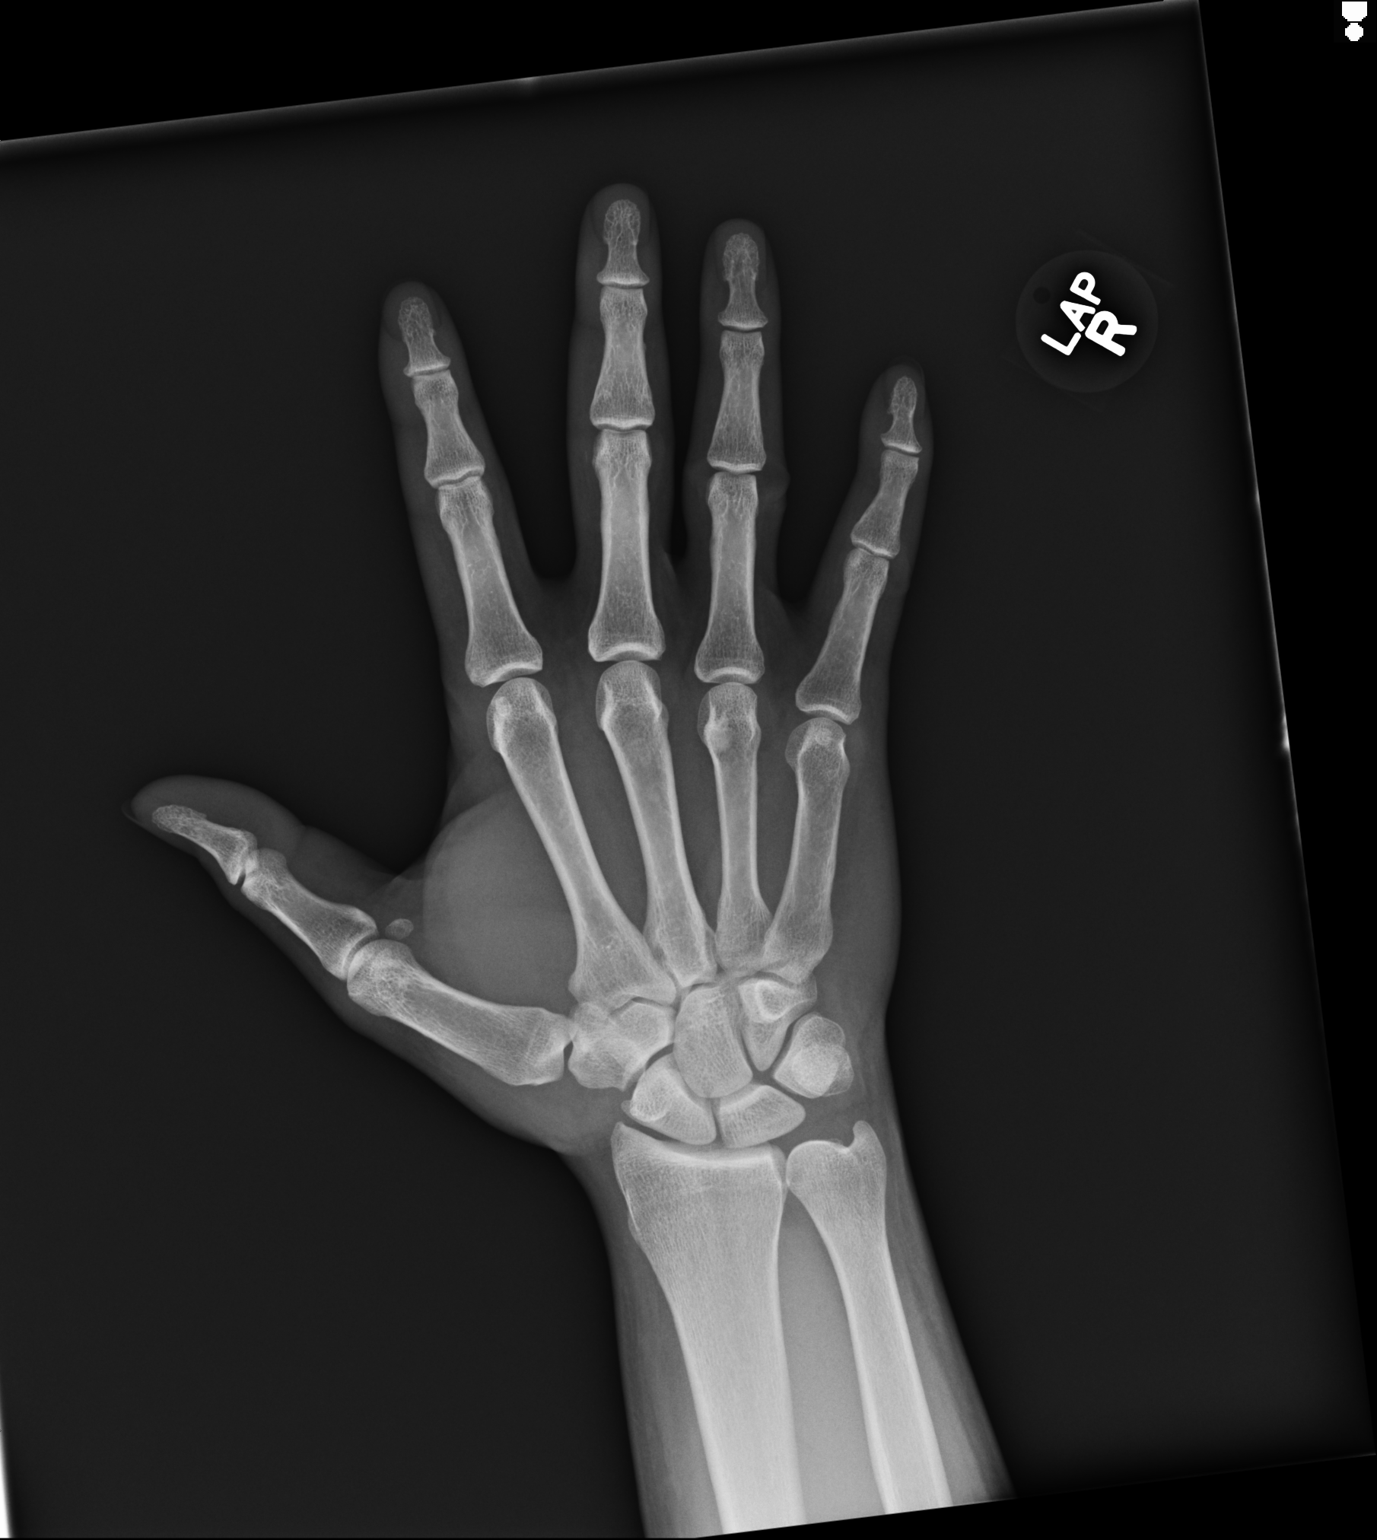
[im 2/3]
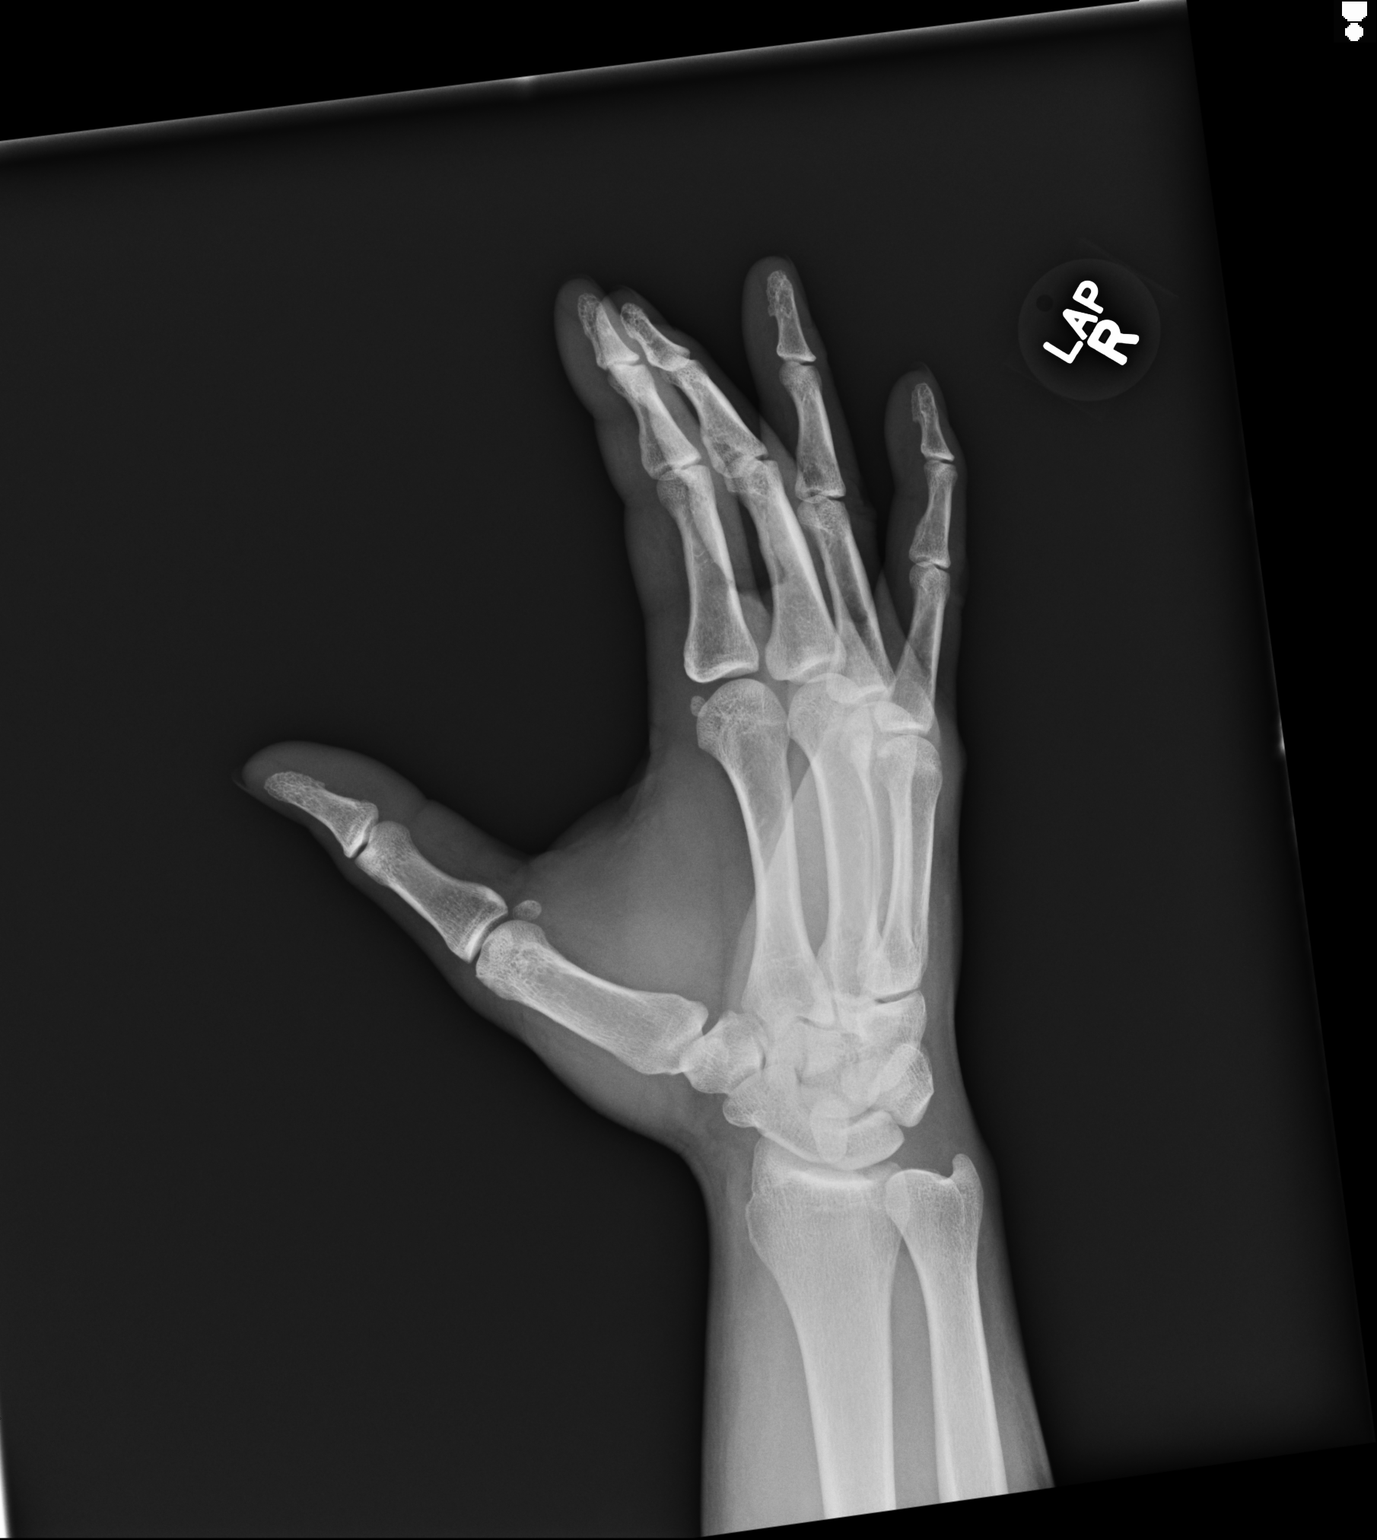
[im 3/3]
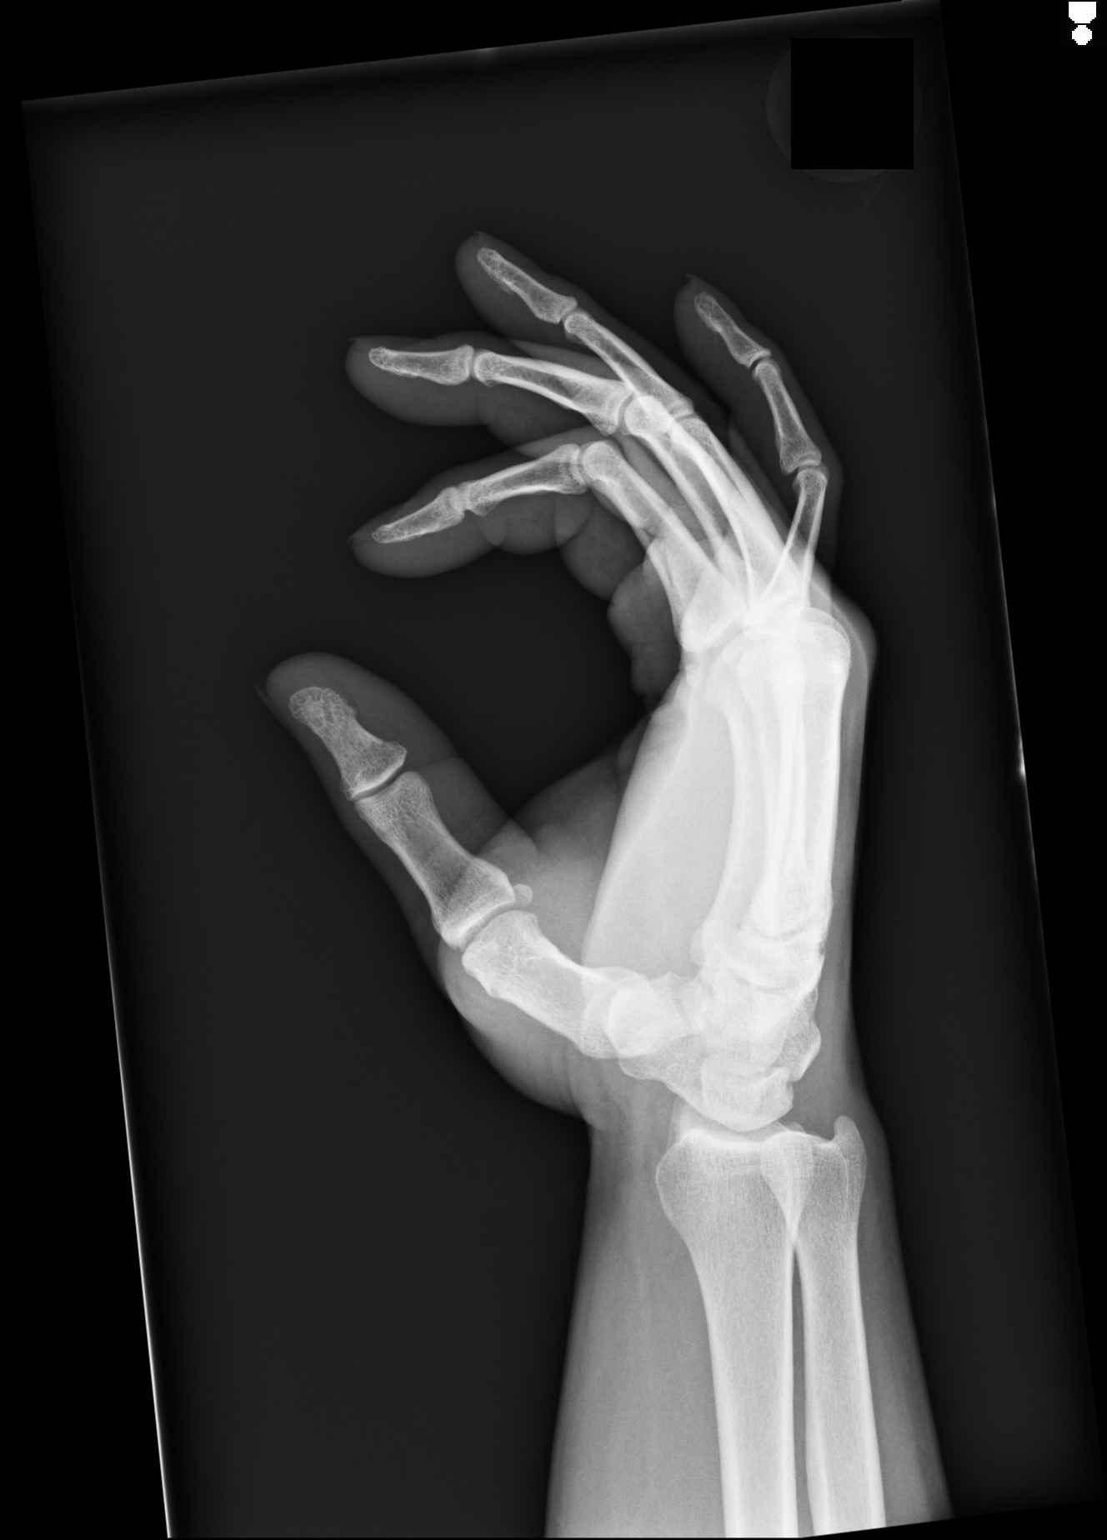

[3 of 3 positions shown; findings below may reference images not displayed]

FINDINGS: There is no evidence of fracture or dislocation. There is no
evidence of arthropathy or other focal bone abnormality. Soft
tissues are unremarkable.
IMPRESSION: No acute abnormality.

## 2015-07-29 MED ORDER — INDOMETHACIN 25 MG PO CAPS: 25.0000 mg | ORAL_CAPSULE | Freq: Three times a day (TID) | ORAL | Status: DC

## 2015-07-29 NOTE — Discharge Instructions (Signed)
FOLLOW UP WITH DR. MENZ IF ANY CONTINUED PROBLEMS  INDOCIN WITH FOOD

## 2015-07-29 NOTE — ED Notes (Signed)
Patient reports waking this morning with right hand pain, reports he is unsure if he slept wrong or not. Told to come here by his employer.

## 2015-07-29 NOTE — ED Provider Notes (Signed)
[facility] Emergency Department Provider Note  ____________________________________________  Time seen: Approximately 12:27 PM  I have reviewed the triage vital signs and the nursing notes.   HISTORY  Chief Complaint Hand Pain   HPI KENSTON LONGTON is a 39 y.o. male is here today with complaint of right hand pain. He states that he is unaware of any injury. He states he woke this morning with pain and swelling. He has not taken any over-the-counter medication. He denies any previous injury to his hand. Currently his pain is an 8 out of 10. Making a fist increases his pain and he is not able to completely make a fist. Nothing has helped his pain.  History reviewed. No pertinent past medical history.  There are no active problems to display for this patient.   History reviewed. No pertinent past surgical history.  Current Outpatient Rx  Name  Route  Sig  Dispense  Refill  . indomethacin (INDOCIN) 25 MG capsule   Oral   Take 1 capsule (25 mg total) by mouth 3 (three) times daily with meals.   15 capsule   0     Allergies Review of patient's allergies indicates no known allergies.  History reviewed. No pertinent family history.  Social History Social History  Substance Use Topics  . Smoking status: Never Smoker   . Smokeless tobacco: None  . Alcohol Use: No    Review of Systems Constitutional: No fever/chills Cardiovascular: Denies chest pain. Respiratory: Denies shortness of breath. Gastrointestinal: No abdominal pain.  No nausea, no vomiting.  Genitourinary: Negative for dysuria. Musculoskeletal: Negative for back pain. Skin: Negative for rash. Neurological: Negative for headaches, focal weakness or numbness.  10-point ROS otherwise negative.  ____________________________________________   PHYSICAL EXAM:  VITAL SIGNS: ED Triage Vitals  Enc Vitals Group     BP 07/29/15 1115 145/99 mmHg     Pulse Rate 07/29/15 1115 74     Resp  07/29/15 1115 20     Temp 07/29/15 1115 98.6 F (37 C)     Temp Source 07/29/15 1115 Oral     SpO2 07/29/15 1115 99 %     Weight 07/29/15 1115 215 lb (97.523 kg)     Height 07/29/15 1115  (1.88 m)     Head Cir --      Peak Flow --      Pain Score 07/29/15 1116 8     Pain Loc --      Pain Edu? --      Excl. in GC? --     Constitutional: Alert and oriented. Well appearing and in no acute distress. Eyes: Conjunctivae are normal. PERRL. EOMI. Head: Atraumatic. Nose: No congestion/rhinnorhea. Neck: No stridor.   Cardiovascular: Normal rate, regular rhythm. Grossly normal heart sounds.  Good peripheral circulation. Respiratory: Normal respiratory effort.  No retractions. Lungs CTAB. Gastrointestinal: Soft and nontender. No distention. Musculoskeletal: Right hand there is moderate edema noted along the hyper thenar eminence area. Right thumb range of motion is restricted secondary to pain and swelling. Motor sensory function intact. Capillary refill is normal. Skin is warm and dry. No lower extremity tenderness nor edema.  No joint effusions. Neurologic:  Normal speech and language. No gross focal neurologic deficits are appreciated. No gait instability. Skin:  Skin is warm, dry and intact. No rash noted. No abrasions or ecchymosis noted on the right hand. Psychiatric: Mood and affect are normal. Speech and behavior are normal.  ____________________________________________   LABS (all labs  ordered are listed, but only abnormal results are displayed)  Labs Reviewed - No data to display   Radiology Right hand no evidence of fracture dislocation per radiologist I, Tommi Rumps, personally viewed and evaluated these images as part of my medical decision making.   ____________________________________________ PROCEDURES  Procedure(s) performed: None  Critical Care performed: No  ____________________________________________   INITIAL IMPRESSION / ASSESSMENT AND PLAN / ED  COURSE  Pertinent labs & imaging results that were available during my care of the patient were reviewed by me and considered in my medical decision making (see chart for details).  Right hand pain with out known injury. Patient was started on Indocin 25 mg 3 times a day. He is follow-up with the orthopedist on call Dr. Rosita Kea  if any continued problems ____________________________________________   FINAL CLINICAL IMPRESSION(S) / ED DIAGNOSES  Final diagnoses:  Hand pain, right      Tommi Rumps, PA-C 07/29/15 1409  Darien Ramus, MD 07/29/15 [phone]

## 2015-11-17 ENCOUNTER — Emergency Department
Admission: EM | Admit: 2015-11-17 | Discharge: 2015-11-17 | Disposition: A | Discharge status: Home / Self Care | Payer: Self-pay | Attending: Emergency Medicine | Admitting: Emergency Medicine

## 2015-11-17 DIAGNOSIS — R109 Unspecified abdominal pain: Secondary | ICD-10-CM | POA: Insufficient documentation

## 2015-11-17 DIAGNOSIS — M791 Myalgia, unspecified site: Secondary | ICD-10-CM

## 2015-11-17 DIAGNOSIS — M79605 Pain in left leg: Secondary | ICD-10-CM | POA: Insufficient documentation

## 2015-11-17 MED ORDER — HYDROCODONE-ACETAMINOPHEN 5-325 MG PO TABS: 1.0000 | ORAL_TABLET | ORAL | Status: DC | PRN

## 2015-11-17 MED ORDER — CYCLOBENZAPRINE HCL 10 MG PO TABS: 10.0000 mg | ORAL_TABLET | Freq: Three times a day (TID) | ORAL | Status: DC | PRN

## 2015-11-17 MED ORDER — NAPROXEN 500 MG PO TBEC: 500.0000 mg | DELAYED_RELEASE_TABLET | Freq: Two times a day (BID) | ORAL | Status: DC

## 2015-11-17 NOTE — ED Notes (Signed)
Pt presents with left side back and leg pain started yesterday at work, worse when walking. States pain is very low when sitting, but when standing or walking pain is 10/10.

## 2015-11-17 NOTE — ED Provider Notes (Signed)
[facility] Emergency Department Provider Note  ____________________________________________  Time seen: Approximately 12:17 PM  I have reviewed the triage vital signs and the nursing notes.   HISTORY  Chief Complaint Shoulder Pain and Leg Pain    HPI Roger Pennington is a 39 y.o. male who reports to the emergency room with complaints of left-sided back and leg pain. Also states some mild abdominal pains. Patient states pain is worse when walking or standing and relieved when laying down. Denies any specific injury or trauma.   History reviewed. No pertinent past medical history.  There are no active problems to display for this patient.   History reviewed. No pertinent past surgical history.  Current Outpatient Rx  Name  Route  Sig  Dispense  Refill  . cyclobenzaprine (FLEXERIL) 10 MG tablet   Oral   Take 1 tablet (10 mg total) by mouth every 8 (eight) hours as needed for muscle spasms.   30 tablet   1   . HYDROcodone-acetaminophen (NORCO) 5-325 MG tablet   Oral   Take 1-2 tablets by mouth every 4 (four) hours as needed for moderate pain.   10 tablet   0   . naproxen (EC NAPROSYN) 500 MG EC tablet   Oral   Take 1 tablet (500 mg total) by mouth 2 (two) times daily with a meal.   60 tablet   0     Allergies Review of patient's allergies indicates no known allergies.  History reviewed. No pertinent family history.  Social History Social History  Substance Use Topics  . Smoking status: Never Smoker   . Smokeless tobacco: None  . Alcohol Use: No    Review of Systems Constitutional: No fever/chills Eyes: No visual changes. ENT: No sore throat. Cardiovascular: Denies chest pain. Respiratory: Denies shortness of breath. Gastrointestinal: No abdominal pain.  No nausea, no vomiting.  No diarrhea.  No constipation. Genitourinary: Negative for dysuria. Musculoskeletal: Positive for back and lateral musculoskeletal pain on the left  side. Skin: Negative for rash. Neurological: Negative for headaches, focal weakness or numbness.  10-point ROS otherwise negative.  ____________________________________________   PHYSICAL EXAM:  VITAL SIGNS: ED Triage Vitals  Enc Vitals Group     BP 11/17/15 1117 159/81 mmHg     Pulse Rate 11/17/15 1117 67     Resp 11/17/15 1117 20     Temp 11/17/15 1117 98.2 F (36.8 C)     Temp Source 11/17/15 1117 Oral     SpO2 11/17/15 1117 98 %     Weight 11/17/15 1117 240 lb (108.863 kg)     Height 11/17/15 1117 6' (1.829 m)     Head Cir --      Peak Flow --      Pain Score 11/17/15 1117 10     Pain Loc --      Pain Edu? --      Excl. in GC? --     Constitutional: Alert and oriented. Well appearing and in no acute distress. Cardiovascular: Normal rate, regular rhythm. Grossly normal heart sounds.  Good peripheral circulation. Respiratory: Normal respiratory effort.  No retractions. Lungs CTAB. Musculoskeletal: No lower extremity tenderness nor edema.  No joint effusions. Neurologic:  Normal speech and language. No gross focal neurologic deficits are appreciated. No gait instability. Skin:  Skin is warm, dry and intact. No rash noted. Psychiatric: Mood and affect are normal. Speech and behavior are normal.  ____________________________________________   LABS (all labs ordered are listed, but  only abnormal results are displayed)  Labs Reviewed - No data to display ____________________________________________   PROCEDURES  Procedure(s) performed: None  Critical Care performed: No  ____________________________________________   INITIAL IMPRESSION / ASSESSMENT AND PLAN / ED COURSE  Pertinent labs & imaging results that were available during my care of the patient were reviewed by me and considered in my medical decision making (see chart for details).  Nonspecific musculoskeletal pain. Generalized. Rx given for Flexeril 10 mg 3 times a day and Anaprox 500 mg twice a day.  Patient follow-up PCP or return when necessary any worsening symptomology. Patient voices no other emergency medical complaints at this time. ____________________________________________   FINAL CLINICAL IMPRESSION(S) / ED DIAGNOSES  Final diagnoses:  Myalgia      Evangeline Dakin, PA-C 11/17/15 1312  Governor Rooks, MD 11/17/15 [phone]

## 2015-11-17 NOTE — Discharge Instructions (Signed)
Musculoskeletal Pain °Musculoskeletal pain is muscle and boney aches and pains. These pains can occur in any part of the body. Your caregiver may treat you without knowing the cause of the pain. They may treat you if blood or urine tests, X-rays, and other tests were normal.  °CAUSES °There is often not a definite cause or reason for these pains. These pains may be caused by a type of germ (virus). The discomfort may also come from overuse. Overuse includes working out too hard when your body is not fit. Boney aches also come from weather changes. Bone is sensitive to atmospheric pressure changes. °HOME CARE INSTRUCTIONS  °· Ask when your test results will be ready. Make sure you get your test results. °· Only take over-the-counter or prescription medicines for pain, discomfort, or fever as directed by your caregiver. If you were given medications for your condition, do not drive, operate machinery or power tools, or sign legal documents for 24 hours. Do not drink alcohol. Do not take sleeping pills or other medications that may interfere with treatment. °· Continue all activities unless the activities cause more pain. When the pain lessens, slowly resume normal activities. Gradually increase the intensity and duration of the activities or exercise. °· During periods of severe pain, bed rest may be helpful. Lay or sit in any position that is comfortable. °· Putting ice on the injured area. °· Put ice in a bag. °· Place a towel between your skin and the bag. °· Leave the ice on for 15 to 20 minutes, 3 to 4 times a day. °· Follow up with your caregiver for continued problems and no reason can be found for the pain. If the pain becomes worse or does not go away, it may be necessary to repeat tests or do additional testing. Your caregiver may need to look further for a possible cause. °SEEK IMMEDIATE MEDICAL CARE IF: °· You have pain that is getting worse and is not relieved by medications. °· You develop chest pain  that is associated with shortness or breath, sweating, feeling sick to your stomach (nauseous), or throw up (vomit). °· Your pain becomes localized to the abdomen. °· You develop any new symptoms that seem different or that concern you. °MAKE SURE YOU:  °· Understand these instructions. °· Will watch your condition. °· Will get help right away if you are not doing well or get worse. °  °This information is not intended to replace advice given to you by your health care provider. Make sure you discuss any questions you have with your health care provider. °  °Document Released: 12/04/2005 Document Revised: 02/26/2012 Document Reviewed: 08/08/2013 °Elsevier Interactive Patient Education ©2016 [employer] ° °Muscle Pain, Adult °Muscle pain (myalgia) may be caused by many things, including: °· Overuse or muscle strain, especially if you are not in shape. This is the most common cause of muscle pain. °· Injury. °· Bruises. °· Viruses, such as the flu. °· Infectious diseases. °· Fibromyalgia, which is a chronic condition that causes muscle tenderness, fatigue, and headache. °· Autoimmune diseases, including lupus. °· Certain drugs, including ACE inhibitors and statins. °Muscle pain may be mild or severe. In most cases, the pain lasts only a short time and goes away without treatment. To diagnose the cause of your muscle pain, your health care provider will take your medical history. This means he or she will ask you when your muscle pain began and what has been happening. If you have not had muscle   pain for very long, your health care provider may want to wait before doing much testing. If your muscle pain has lasted a long time, your health care provider may want to run tests right away. If your health care provider thinks your muscle pain may be caused by illness, you may need to have additional tests to rule out certain conditions.  °Treatment for muscle pain depends on the cause. Home care is often enough to relieve  muscle pain. Your health care provider may also prescribe anti-inflammatory medicine. °HOME CARE INSTRUCTIONS °Watch your condition for any changes. The following actions may help to lessen any discomfort you are feeling: °· Only take over-the-counter or prescription medicines as directed by your health care provider. °· Apply ice to the sore muscle: °¨ Put ice in a plastic bag. °¨ Place a towel between your skin and the bag. °¨ Leave the ice on for 15-20 minutes, 3-4 times a day. °· You may alternate applying hot and cold packs to the muscle as directed by your health care provider. °· If overuse is causing your muscle pain, slow down your activities until the pain goes away. °¨ Remember that it is normal to feel some muscle pain after starting a workout program. Muscles that have not been used often will be sore at first. °¨ Do regular, gentle exercises if you are not usually active. °¨ Warm up before exercising to lower your risk of muscle pain. °· Do not continue working out if the pain is very bad. Bad pain could mean you have injured a muscle. °SEEK MEDICAL CARE IF: °· Your muscle pain gets worse, and medicines do not help. °· You have muscle pain that lasts longer than 3 days. °· You have a rash or fever along with muscle pain. °· You have muscle pain after a tick bite. °· You have muscle pain while working out, even though you are in good physical condition. °· You have redness, soreness, or swelling along with muscle pain. °· You have muscle pain after starting a new medicine or changing the dose of a medicine. °SEEK IMMEDIATE MEDICAL CARE IF: °· You have trouble breathing. °· You have trouble swallowing. °· You have muscle pain along with a stiff neck, fever, and vomiting. °· You have severe muscle weakness or cannot move part of your body. °MAKE SURE YOU:  °· Understand these instructions. °· Will watch your condition. °· Will get help right away if you are not doing well or get worse. °  °This  information is not intended to replace advice given to you by your health care provider. Make sure you discuss any questions you have with your health care provider. °  °Document Released: 10/26/2006 Document Revised: 12/25/2014 Document Reviewed: 09/30/2013 °Elsevier Interactive Patient Education ©2016 [employer] ° °

## 2015-11-17 NOTE — ED Notes (Addendum)
Pt states that he began to have neck pain, back pain, shoulder pain and leg pain all to the left side yesterday at work. Denies injury. Pt states that it feels like a "pinched nerve". Pain with movement. Denies CP, SOB. Pt alert and oriented X4, active, cooperative, pt in NAD. RR even and unlabored, color WNL.

## 2016-02-08 ENCOUNTER — Encounter
Admission: EM | Disposition: A | Discharge status: Home / Self Care | Payer: Self-pay | Source: Home / Self Care | Attending: Internal Medicine

## 2016-02-08 ENCOUNTER — Encounter: Payer: Self-pay | Admitting: Medical Oncology

## 2016-02-08 ENCOUNTER — Inpatient Hospital Stay: Payer: Self-pay

## 2016-02-08 ENCOUNTER — Emergency Department: Payer: Self-pay

## 2016-02-08 ENCOUNTER — Inpatient Hospital Stay
Admission: EM | Admit: 2016-02-08 | Discharge: 2016-02-10 | DRG: 382 | Disposition: A | Discharge status: Home / Self Care | Payer: Self-pay | Attending: Internal Medicine | Admitting: Internal Medicine

## 2016-02-08 DIAGNOSIS — K2211 Ulcer of esophagus with bleeding: Principal | ICD-10-CM | POA: Diagnosis present

## 2016-02-08 DIAGNOSIS — K644 Residual hemorrhoidal skin tags: Secondary | ICD-10-CM | POA: Diagnosis present

## 2016-02-08 DIAGNOSIS — Z8711 Personal history of peptic ulcer disease: Secondary | ICD-10-CM

## 2016-02-08 DIAGNOSIS — F17200 Nicotine dependence, unspecified, uncomplicated: Secondary | ICD-10-CM | POA: Diagnosis present

## 2016-02-08 DIAGNOSIS — R1011 Right upper quadrant pain: Secondary | ICD-10-CM

## 2016-02-08 DIAGNOSIS — K2980 Duodenitis without bleeding: Secondary | ICD-10-CM | POA: Diagnosis present

## 2016-02-08 DIAGNOSIS — K2960 Other gastritis without bleeding: Secondary | ICD-10-CM | POA: Diagnosis present

## 2016-02-08 DIAGNOSIS — Z8249 Family history of ischemic heart disease and other diseases of the circulatory system: Secondary | ICD-10-CM

## 2016-02-08 DIAGNOSIS — K648 Other hemorrhoids: Secondary | ICD-10-CM | POA: Diagnosis present

## 2016-02-08 DIAGNOSIS — K922 Gastrointestinal hemorrhage, unspecified: Secondary | ICD-10-CM | POA: Diagnosis present

## 2016-02-08 DIAGNOSIS — R1012 Left upper quadrant pain: Secondary | ICD-10-CM

## 2016-02-08 DIAGNOSIS — K449 Diaphragmatic hernia without obstruction or gangrene: Secondary | ICD-10-CM | POA: Diagnosis present

## 2016-02-08 DIAGNOSIS — K210 Gastro-esophageal reflux disease with esophagitis: Secondary | ICD-10-CM | POA: Diagnosis present

## 2016-02-08 DIAGNOSIS — K2210 Ulcer of esophagus without bleeding: Secondary | ICD-10-CM | POA: Diagnosis present

## 2016-02-08 DIAGNOSIS — R509 Fever, unspecified: Secondary | ICD-10-CM | POA: Diagnosis not present

## 2016-02-08 DIAGNOSIS — K228 Other specified diseases of esophagus: Secondary | ICD-10-CM | POA: Diagnosis present

## 2016-02-08 DIAGNOSIS — K257 Chronic gastric ulcer without hemorrhage or perforation: Secondary | ICD-10-CM | POA: Diagnosis present

## 2016-02-08 HISTORY — DX: Tobacco use: Z72.0

## 2016-02-08 LAB — CBC WITH DIFFERENTIAL/PLATELET
BASOS ABS: 0 10*3/uL (ref 0–0.1)
BASOS PCT: 0 %
EOS ABS: 0.2 10*3/uL (ref 0–0.7)
Eosinophils Relative: 3 %
HEMATOCRIT: 41.2 % (ref 40.0–52.0)
HEMOGLOBIN: 12.8 g/dL — AB (ref 13.0–18.0)
Lymphocytes Relative: 12 %
Lymphs Abs: 0.8 10*3/uL — ABNORMAL LOW (ref 1.0–3.6)
MCH: 25 pg — ABNORMAL LOW (ref 26.0–34.0)
MCHC: 31 g/dL — AB (ref 32.0–36.0)
MCV: 80.7 fL (ref 80.0–100.0)
MONOS PCT: 6 %
Monocytes Absolute: 0.4 10*3/uL (ref 0.2–1.0)
NEUTROS ABS: 5.7 10*3/uL (ref 1.4–6.5)
NEUTROS PCT: 79 %
Platelets: 142 10*3/uL — ABNORMAL LOW (ref 150–440)
RBC: 5.11 MIL/uL (ref 4.40–5.90)
RDW: 14 % (ref 11.5–14.5)
WBC: 7.1 10*3/uL (ref 3.8–10.6)

## 2016-02-08 LAB — CBC
HEMATOCRIT: 43.1 % (ref 40.0–52.0)
Hemoglobin: 13.5 g/dL (ref 13.0–18.0)
MCH: 25.3 pg — ABNORMAL LOW (ref 26.0–34.0)
MCHC: 31.4 g/dL — AB (ref 32.0–36.0)
MCV: 80.6 fL (ref 80.0–100.0)
Platelets: 176 10*3/uL (ref 150–440)
RBC: 5.34 MIL/uL (ref 4.40–5.90)
RDW: 14 % (ref 11.5–14.5)
WBC: 6.5 10*3/uL (ref 3.8–10.6)

## 2016-02-08 LAB — COMPREHENSIVE METABOLIC PANEL
ALBUMIN: 4.4 g/dL (ref 3.5–5.0)
ALK PHOS: 69 U/L (ref 38–126)
ALT: 19 U/L (ref 17–63)
AST: 22 U/L (ref 15–41)
Anion gap: 5 (ref 5–15)
BILIRUBIN TOTAL: 0.9 mg/dL (ref 0.3–1.2)
BUN: 11 mg/dL (ref 6–20)
CALCIUM: 9.1 mg/dL (ref 8.9–10.3)
CO2: 28 mmol/L (ref 22–32)
Chloride: 106 mmol/L (ref 101–111)
Creatinine, Ser: 1.11 mg/dL (ref 0.61–1.24)
GFR calc Af Amer: >60 mL/min (ref >60)
GFR calc non Af Amer: >60 mL/min (ref >60)
GLUCOSE: 113 mg/dL — AB (ref 65–99)
Potassium: 4 mmol/L (ref 3.5–5.1)
Sodium: 139 mmol/L (ref 135–145)
TOTAL PROTEIN: 7.5 g/dL (ref 6.5–8.1)

## 2016-02-08 LAB — PROTIME-INR
INR: 1.09
PROTHROMBIN TIME: 14.3 s (ref 11.4–15.0)

## 2016-02-08 LAB — MRSA PCR SCREENING: MRSA BY PCR: NEGATIVE

## 2016-02-08 LAB — LIPASE, BLOOD: Lipase: 21 U/L (ref 11–51)

## 2016-02-08 LAB — HEMOGLOBIN: HEMOGLOBIN: 12.2 g/dL — AB (ref 13.0–18.0)

## 2016-02-08 IMAGING — DX DG ABDOMEN ACUTE W/ 1V CHEST
4 series · 4 of 4 positions shown · non-contrast
Comparison: Chest radiograph [DATE]. CT abdomen/pelvis earlier
this day at [RG] hour.

CLINICAL DATA: Acute left upper quadrant abdominal pain.  Pre EGD.

EXAM:
DG ABDOMEN ACUTE W/ 1V CHEST

[abdomen kub (1 of 2)]
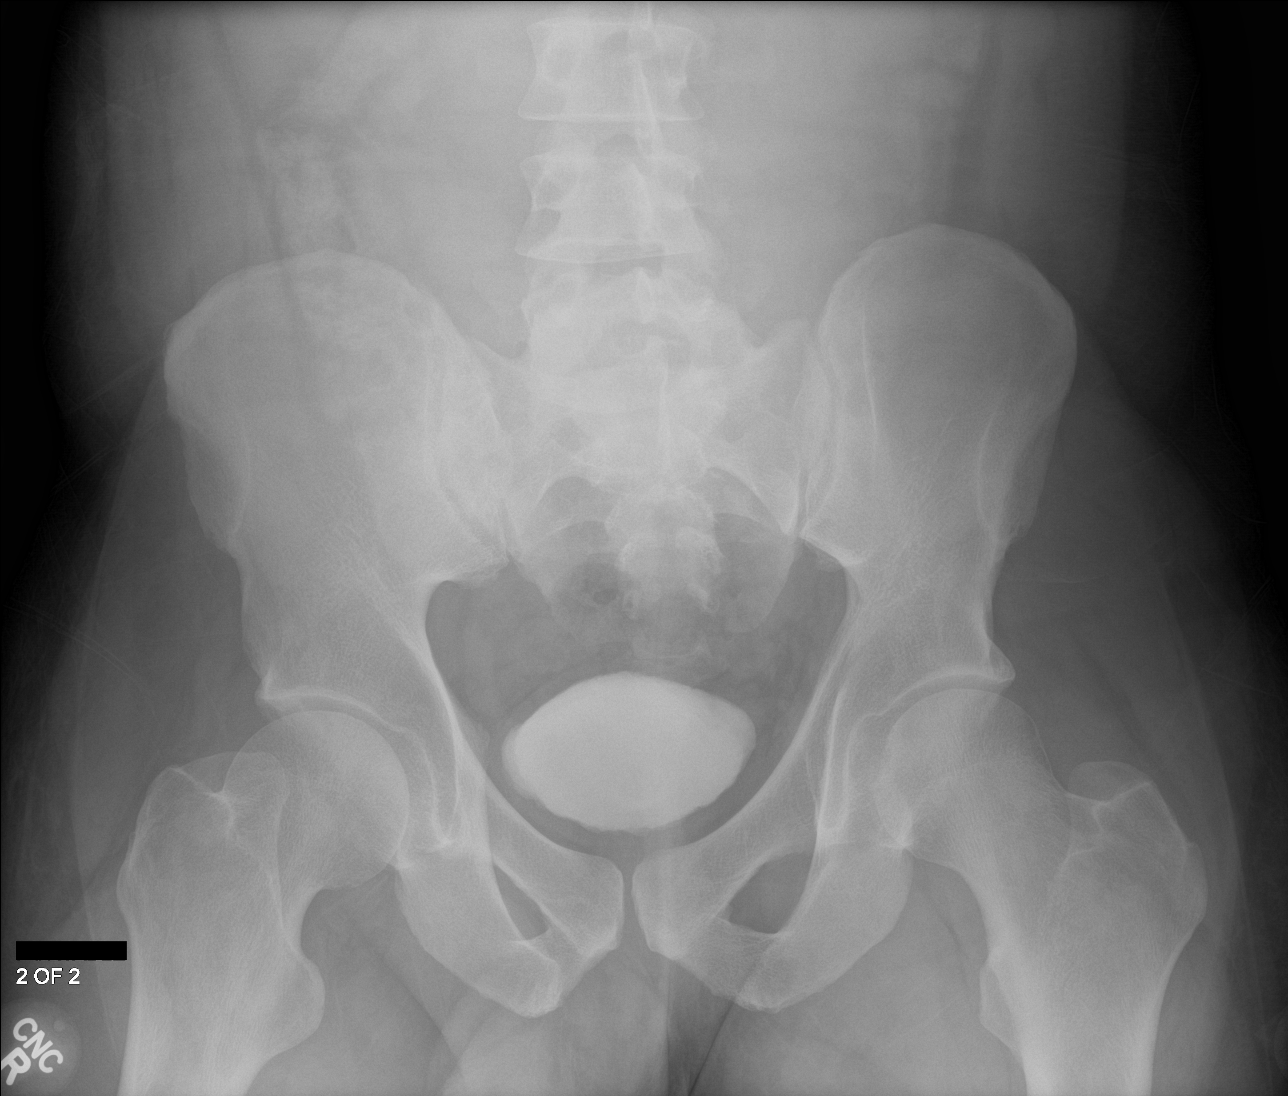

[abdomen kub (2 of 2)]
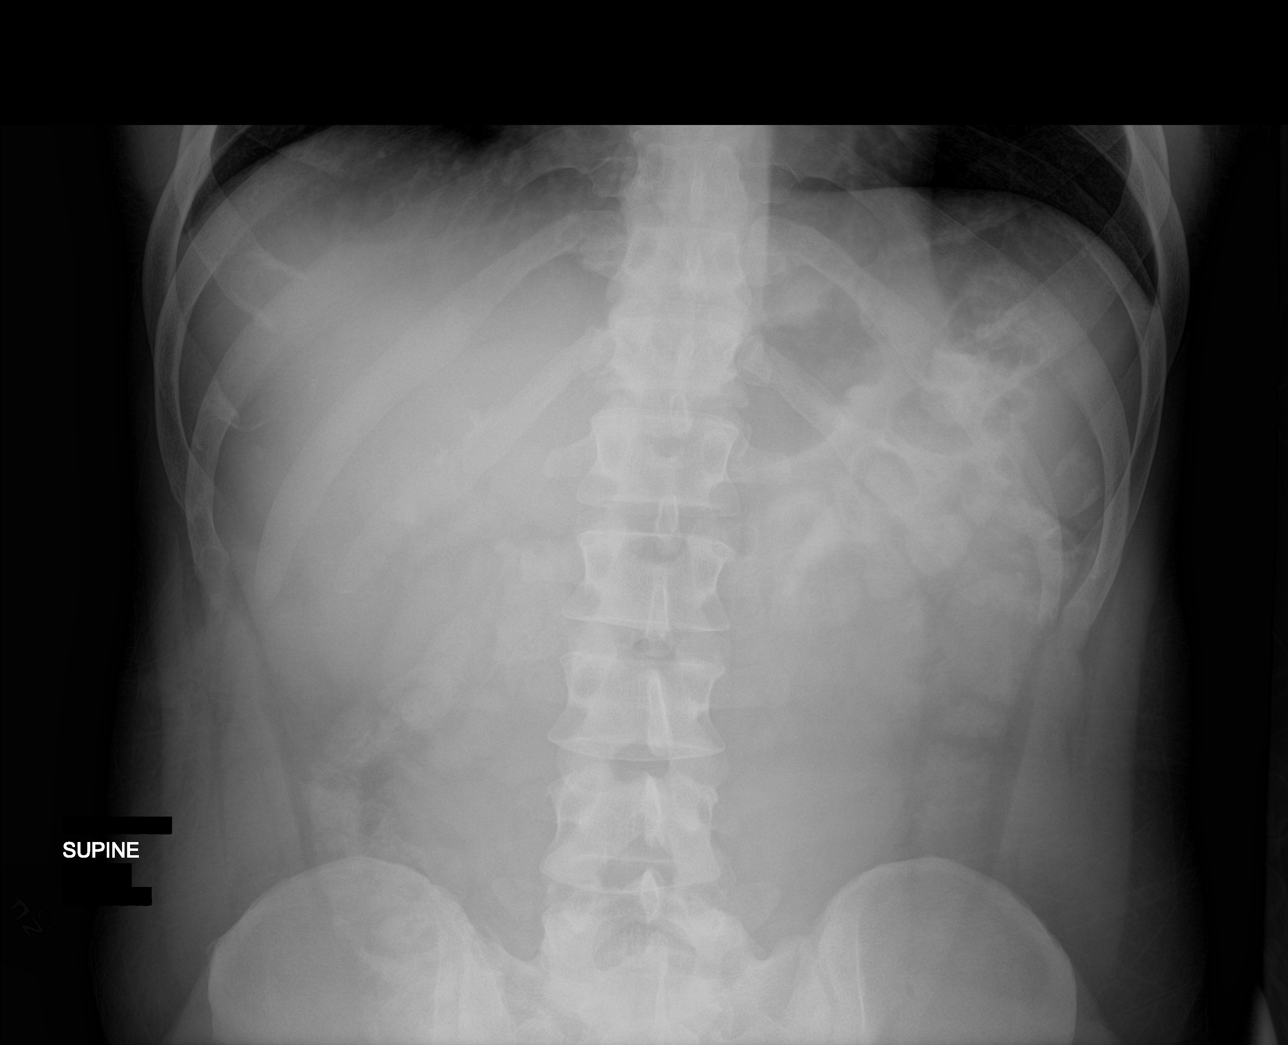

[abdomen erect]
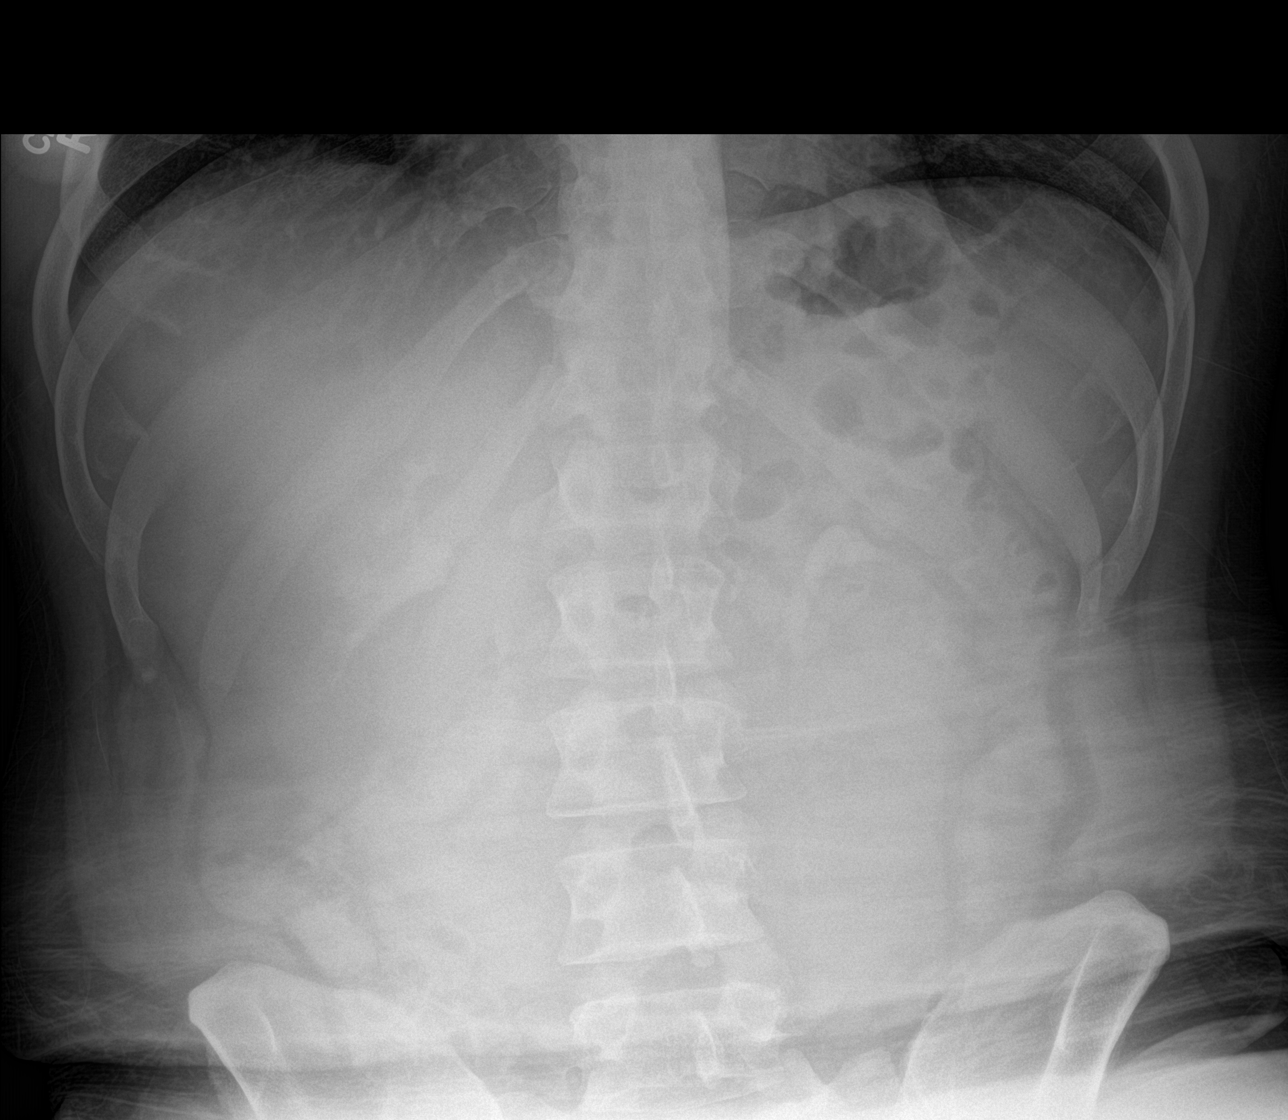

[chest ap]
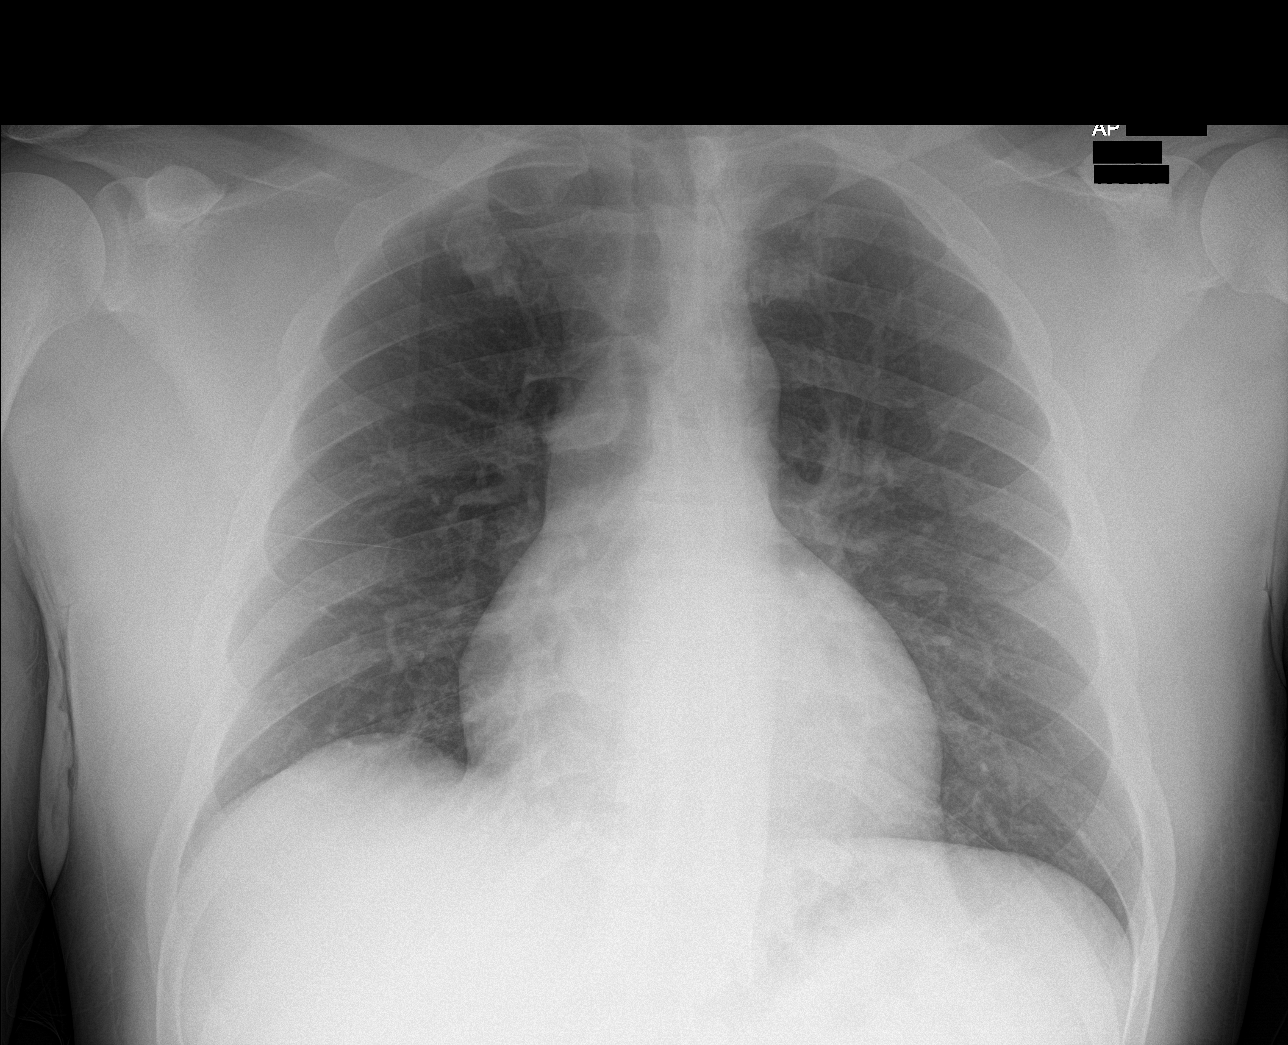

[4 of 4 positions shown; findings below may reference images not displayed]

FINDINGS: The cardiomediastinal contours are normal. The lungs are clear.
There is no free intra-abdominal air. No dilated bowel loops to
suggest obstruction. Enteric contrast throughout the colon from
prior CT. Excreted intravenous contrast within the urinary bladder
and renal collecting systems. No acute osseous abnormalities are
seen.
IMPRESSION: 1. Clear lungs.
2. Normal bowel gas pattern. Enteric contrast throughout the colon
from prior CT. No free air.

## 2016-02-08 IMAGING — CT CT ABD-PELV W/ CM
2 of 7 series · 15 of 46 positions shown, 17 images · IV contrast (omnipaque)
Comparison: Chest radiograph of [DATE]

CLINICAL DATA: Lower abdominal pain. Blood in stool. Burning
abdominal pain.

EXAM:
CT ABDOMEN AND PELVIS WITH CONTRAST
TECHNIQUE: Multidetector CT imaging of the abdomen and pelvis was performed
using the standard protocol following bolus administration of
intravenous contrast.
CONTRAST:  100mL OMNIPAQUE IOHEXOL 300 MG/ML  SOLN

[Series 2: routine abd pel with · axial · 0.76mm/px · z∈[-488,-83]mm · 12 of 97 slices shown, 14 images]
[im 8/97  soft-tissue]
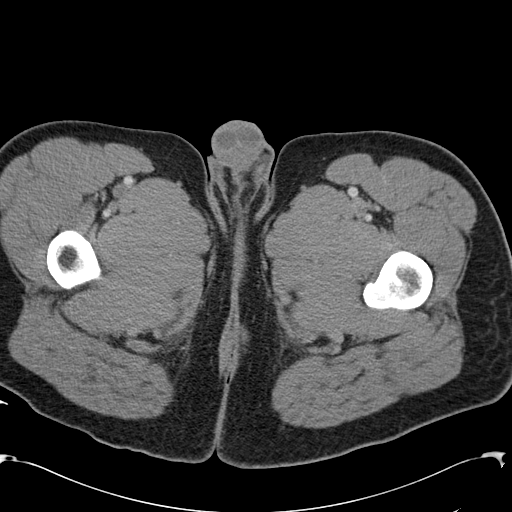
[im 8/97  bone]
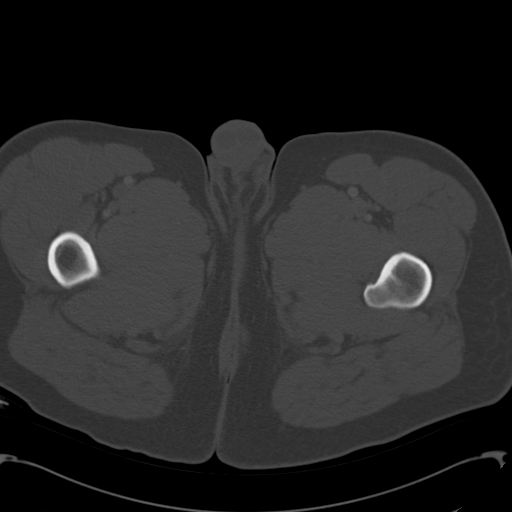
[im 15/97  soft-tissue]
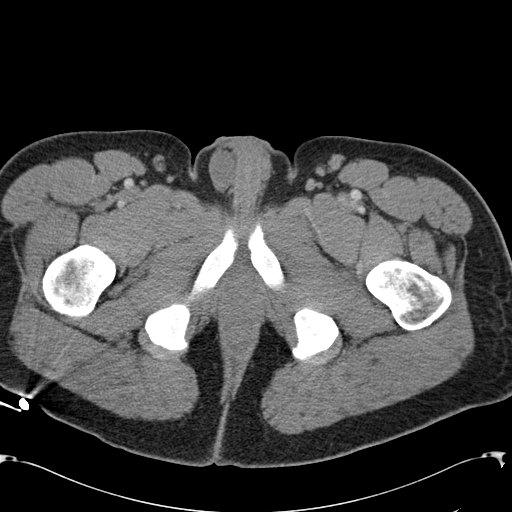
[im 23/97  soft-tissue]
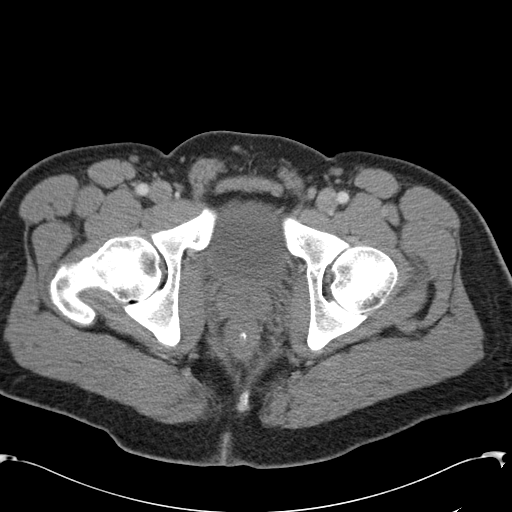
[im 30/97  soft-tissue]
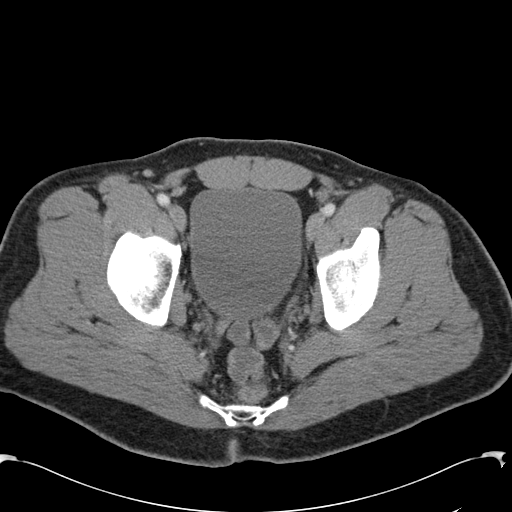
[im 37/97  soft-tissue]
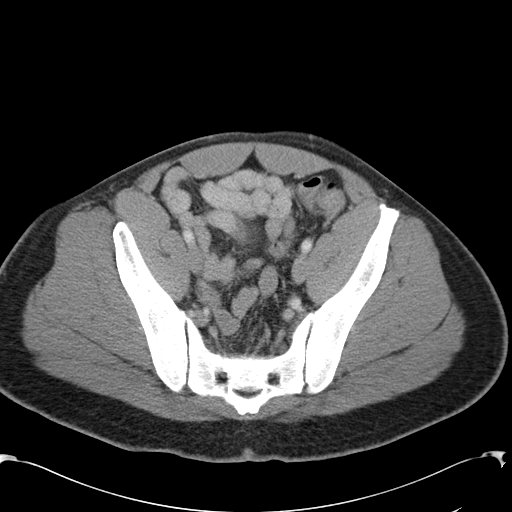
[im 45/97  soft-tissue]
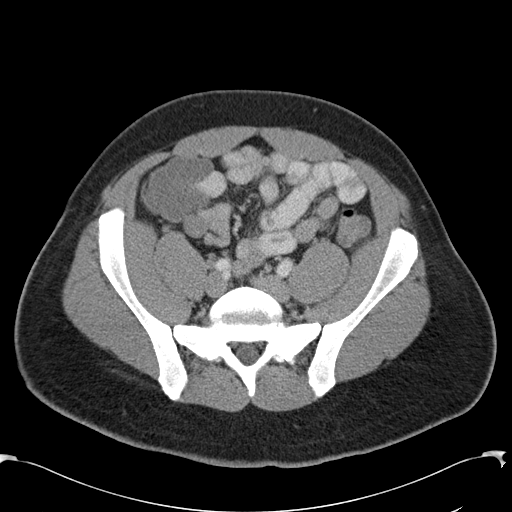
[im 52/97  soft-tissue]
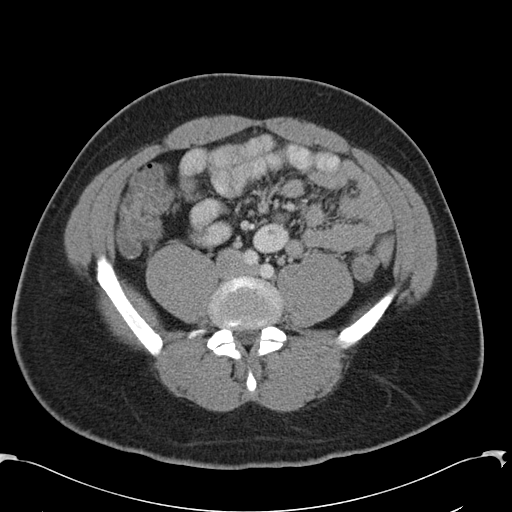
[im 60/97  soft-tissue]
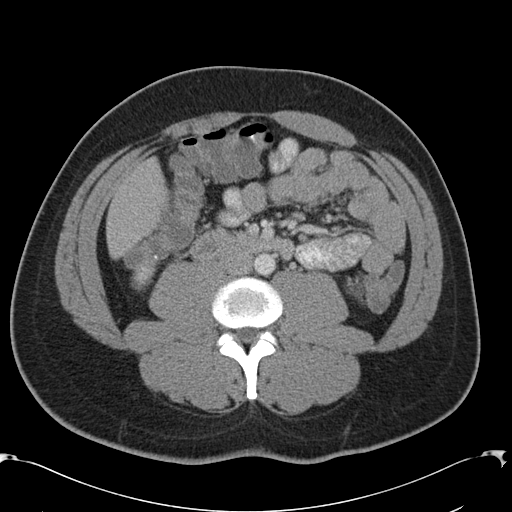
[im 67/97  soft-tissue]
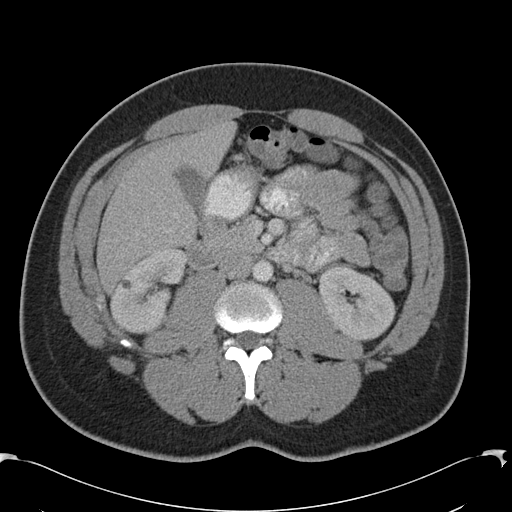
[im 67/97  bone]
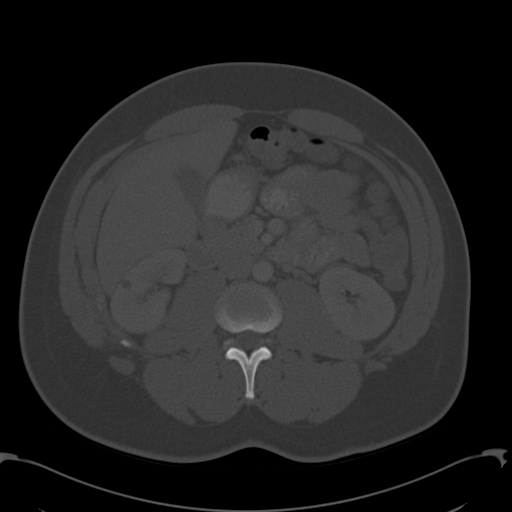
[im 74/97  soft-tissue]
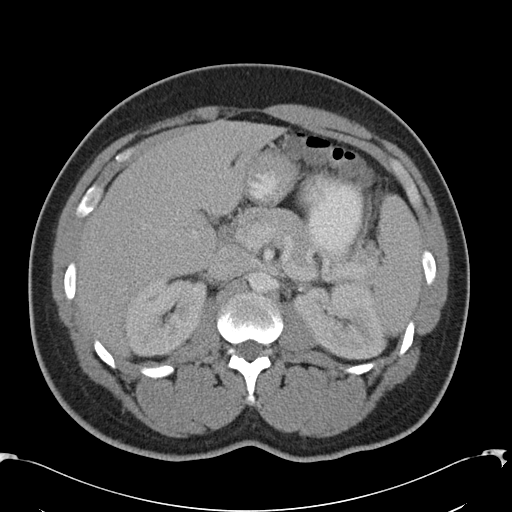
[im 82/97  soft-tissue]
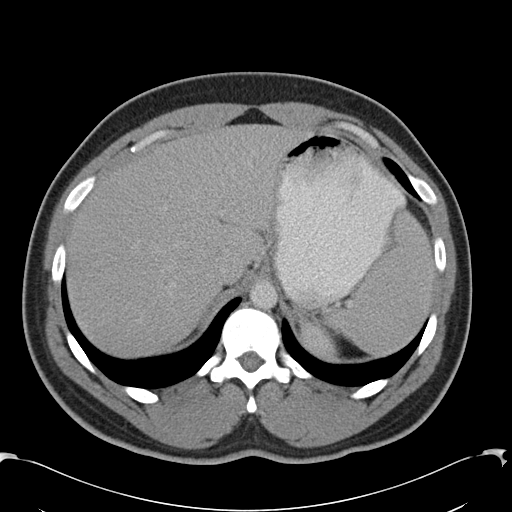
[im 89/97  soft-tissue]
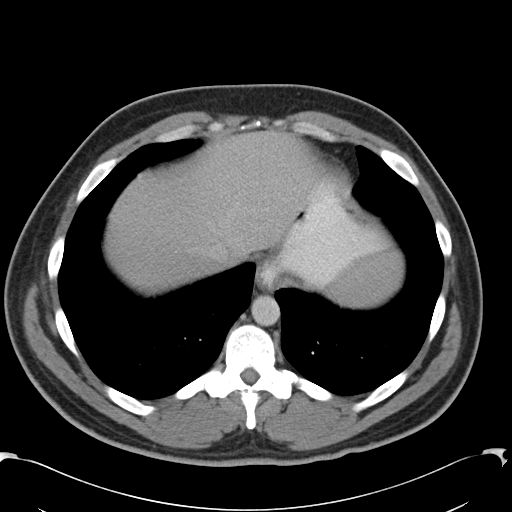

[Series 8: cor routine abd pel with · coronal · 0.80mm/px · 3 of 146 slices shown]
[im 49/146  soft-tissue]
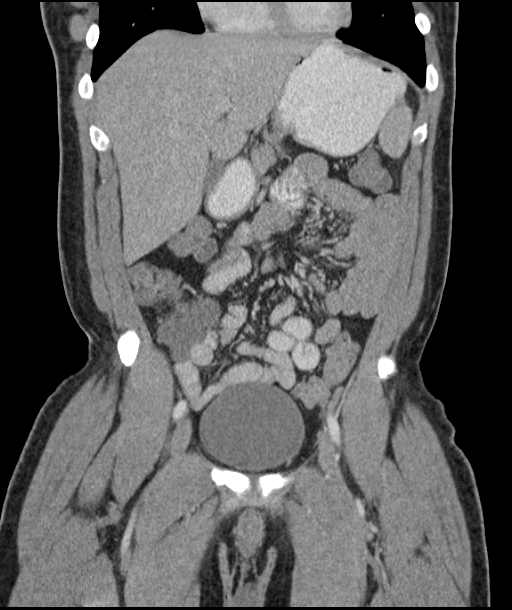
[im 65/146  soft-tissue]
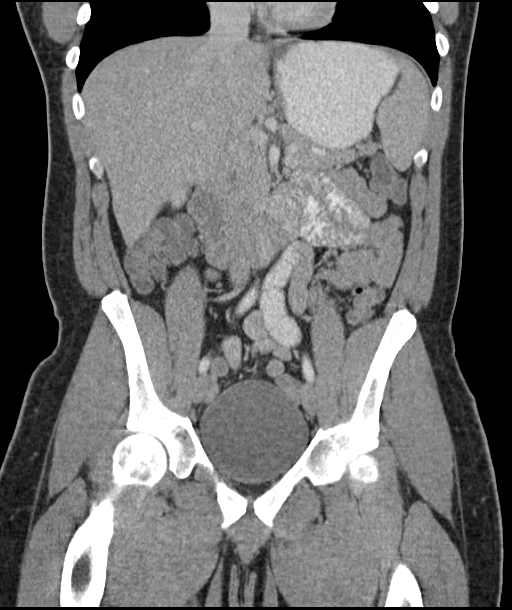
[im 81/146  soft-tissue]
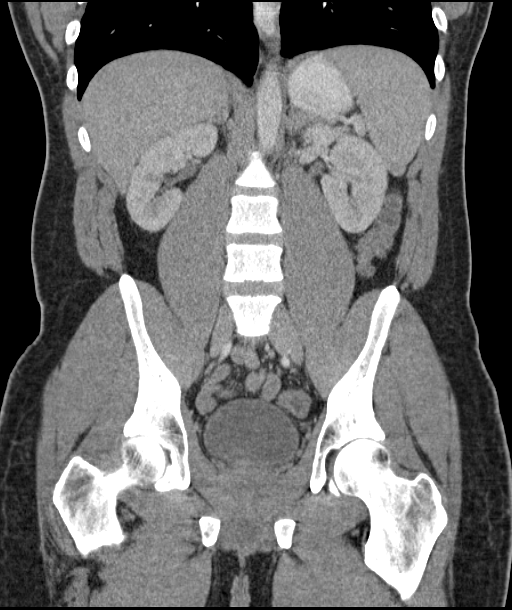

[15 of 46 positions shown; findings below may reference images not displayed]

FINDINGS: The patient experienced vomiting after the injection. The vomiting
appear to include blood.

Lower chest: Contrast medium is present in the distal esophagus. No
visible extravasation of gas or contrast in the vicinity of the
lower thoracic esophagus.

Hepatobiliary: 9 mm hypodense lesion of the right hepatic lobe,
image 20 series 2. Possible 4 mm lesion in the lateral segment left
hepatic lobe, image 19 series 2. Questionable 4 mm lesion inferiorly
in the right hepatic lobe, image 32 series 2. These lesions are
technically too small to characterize although statistically likely
to be cysts. No prior cross-sectional imaging for correlation.
Gallbladder unremarkable, no biliary dilatation.

Pancreas: Unremarkable

Spleen: Unremarkable

Adrenals/Urinary Tract: Adrenal glands normal. Hypodense 7 mm right
mid to lower kidney lesion on image 31 series 2, remains hypodense
on delayed images, likely a cyst but technically too small to
characterize. There is a similar too small to characterize 7 mm
lesion of the left mid kidney on image 21 series 2. A 2.2 by 1.8 cm
lesion of the left kidney upper pole is fluid density on portal
venous and delayed phase images, compatible with benign cyst.

Stomach/Bowel: Wall thickening along the anterior gastric body,
image 10 series 9 and image 25 series 2. However, this does not
appear to persist on the delayed images.

Appendix normal. Scattered air-fluid levels in the descending and
sigmoid colon raise the possibility of diarrheal process. Orally
administered contrast extends to the distal small bowel but not
quite to the terminal most ileum.

Vascular/Lymphatic: Unremarkable

Reproductive: Mildly retracted right testicle along the spermatic
cord.

Other: No supplemental non-categorized findings.

Musculoskeletal: Disc osteophyte complex at L5-S1 eccentric to the
left with borderline left foraminal stenosis. Suspected right
foraminal stenosis at T12-L1 due to facet arthropathy.
IMPRESSION: 1. Somewhat indistinct anterior wall thickening in the stomach body.
This appears different from the posterior gastric wall, but on
delayed phase images is much less apparent, raising the possibility
of peristalsis as a cause. Given the presence of vomiting apparently
containing blood after the contrast injection, suspicion for
possible upper GI bleed is raised and assessment of the esophagus
and stomach should be considered. There is evidence of
gastroesophageal reflux during the course of today's exam.
2. Air-fluid levels in the distal colon suspicious for diarrheal
process.
3. Lumbar spondylosis and degenerative disc disease with suspected
right foraminal stenosis at T12-L1 due to facet arthropathy.
4. Other imaging findings of potential clinical significance:
Nonspecific hypodense hepatic and renal lesions are statistically
likely to be small cysts. Mildly retracted right testicle along the
spermatic cord.

## 2016-02-08 SURGERY — ESOPHAGOGASTRODUODENOSCOPY (EGD) WITH PROPOFOL
Anesthesia: General

## 2016-02-08 MED ORDER — IOHEXOL 240 MG/ML SOLN
25.0000 mL | Freq: Once | INTRAMUSCULAR | Status: AC | PRN
  Administered 2016-02-08: 25 mL via ORAL

## 2016-02-08 MED ORDER — PANTOPRAZOLE SODIUM 40 MG IV SOLR: 40.0000 mg | Freq: Two times a day (BID) | INTRAVENOUS | Status: DC

## 2016-02-08 MED ORDER — ADULT MULTIVITAMIN W/MINERALS CH
1.0000 | ORAL_TABLET | Freq: Every day | ORAL | Status: DC
  Administered 2016-02-10: 1 via ORAL
  Filled 2016-02-08 (×2): qty 1

## 2016-02-08 MED ORDER — LORAZEPAM 2 MG/ML IJ SOLN: 1.0000 mg | Freq: Four times a day (QID) | INTRAMUSCULAR | Status: DC | PRN

## 2016-02-08 MED ORDER — IOHEXOL 300 MG/ML  SOLN
100.0000 mL | Freq: Once | INTRAMUSCULAR | Status: AC | PRN
  Administered 2016-02-08: 100 mL via INTRAVENOUS

## 2016-02-08 MED ORDER — SODIUM CHLORIDE 0.9 % IV SOLN
8.0000 mg/h | INTRAVENOUS | Status: DC
  Administered 2016-02-08: 8 mg/h via INTRAVENOUS
  Filled 2016-02-08 (×3): qty 80

## 2016-02-08 MED ORDER — OCTREOTIDE LOAD VIA INFUSION
50.0000 ug | Freq: Once | INTRAVENOUS | Status: DC
  Filled 2016-02-08: qty 25

## 2016-02-08 MED ORDER — SODIUM CHLORIDE 0.9 % IV SOLN
INTRAVENOUS | Status: DC
  Administered 2016-02-08: 1000 mL via INTRAVENOUS
  Administered 2016-02-09 – 2016-02-10 (×2): via INTRAVENOUS

## 2016-02-08 MED ORDER — ONDANSETRON HCL 4 MG/2ML IJ SOLN: 4.0000 mg | Freq: Four times a day (QID) | INTRAMUSCULAR | Status: DC | PRN

## 2016-02-08 MED ORDER — PANTOPRAZOLE SODIUM 40 MG IV SOLR
INTRAVENOUS | Status: AC
  Administered 2016-02-08: 80 mg
  Filled 2016-02-08: qty 80

## 2016-02-08 MED ORDER — LORAZEPAM 1 MG PO TABS: 1.0000 mg | ORAL_TABLET | Freq: Four times a day (QID) | ORAL | Status: DC | PRN

## 2016-02-08 MED ORDER — MORPHINE SULFATE (PF) 4 MG/ML IV SOLN
INTRAVENOUS | Status: AC
  Filled 2016-02-08: qty 1

## 2016-02-08 MED ORDER — MORPHINE SULFATE (PF) 2 MG/ML IV SOLN
2.0000 mg | Freq: Once | INTRAVENOUS | Status: AC
  Administered 2016-02-08: 2 mg via INTRAVENOUS
  Filled 2016-02-08: qty 1

## 2016-02-08 MED ORDER — SODIUM CHLORIDE 0.9% FLUSH
3.0000 mL | Freq: Two times a day (BID) | INTRAVENOUS | Status: DC
  Administered 2016-02-08 – 2016-02-09 (×4): 3 mL via INTRAVENOUS

## 2016-02-08 MED ORDER — SODIUM CHLORIDE 0.9 % IV BOLUS (SEPSIS)
1000.0000 mL | Freq: Once | INTRAVENOUS | Status: AC
  Administered 2016-02-08: 1000 mL via INTRAVENOUS

## 2016-02-08 MED ORDER — SODIUM CHLORIDE 0.9% FLUSH
3.0000 mL | INTRAVENOUS | Status: DC | PRN
  Administered 2016-02-10: 3 mL via INTRAVENOUS
  Filled 2016-02-08: qty 3

## 2016-02-08 MED ORDER — ONDANSETRON HCL 4 MG/2ML IJ SOLN
4.0000 mg | Freq: Once | INTRAMUSCULAR | Status: AC
  Administered 2016-02-08: 4 mg via INTRAVENOUS
  Filled 2016-02-08: qty 2

## 2016-02-08 MED ORDER — SODIUM CHLORIDE 0.9 % IV SOLN: 80.0000 mg | Freq: Once | INTRAVENOUS | Status: DC

## 2016-02-08 MED ORDER — SODIUM CHLORIDE 0.9 % IV SOLN
50.0000 ug/h | INTRAVENOUS | Status: DC
  Administered 2016-02-08: 50 ug/h via INTRAVENOUS
  Filled 2016-02-08 (×5): qty 1

## 2016-02-08 MED ORDER — VITAMIN B-1 100 MG PO TABS
100.0000 mg | ORAL_TABLET | Freq: Every day | ORAL | Status: DC
  Administered 2016-02-10: 100 mg via ORAL
  Filled 2016-02-08 (×2): qty 1

## 2016-02-08 MED ORDER — FOLIC ACID 1 MG PO TABS
1.0000 mg | ORAL_TABLET | Freq: Every day | ORAL | Status: DC
  Administered 2016-02-10: 1 mg via ORAL
  Filled 2016-02-08 (×2): qty 1

## 2016-02-08 MED ORDER — ONDANSETRON HCL 4 MG/2ML IJ SOLN
INTRAMUSCULAR | Status: AC
  Filled 2016-02-08: qty 2

## 2016-02-08 MED ORDER — ONDANSETRON HCL 4 MG PO TABS: 4.0000 mg | ORAL_TABLET | Freq: Four times a day (QID) | ORAL | Status: DC | PRN

## 2016-02-08 MED ORDER — MORPHINE SULFATE (PF) 4 MG/ML IV SOLN
4.0000 mg | Freq: Once | INTRAVENOUS | Status: AC
  Administered 2016-02-08: 4 mg via INTRAVENOUS

## 2016-02-08 MED ORDER — THIAMINE HCL 100 MG/ML IJ SOLN: 100.0000 mg | Freq: Every day | INTRAMUSCULAR | Status: DC

## 2016-02-08 MED ORDER — SODIUM CHLORIDE 0.9% FLUSH
3.0000 mL | Freq: Two times a day (BID) | INTRAVENOUS | Status: DC
  Administered 2016-02-08 – 2016-02-09 (×3): 3 mL via INTRAVENOUS

## 2016-02-08 MED ORDER — SODIUM CHLORIDE 0.9 % IV SOLN: 80.0000 mg | INTRAVENOUS | Status: DC

## 2016-02-08 MED ORDER — HYDROMORPHONE HCL 1 MG/ML IJ SOLN
0.5000 mg | INTRAMUSCULAR | Status: DC | PRN
  Administered 2016-02-08: 0.5 mg via INTRAVENOUS
  Filled 2016-02-08: qty 1

## 2016-02-08 MED ORDER — ONDANSETRON HCL 4 MG/2ML IJ SOLN
4.0000 mg | Freq: Once | INTRAMUSCULAR | Status: AC
  Administered 2016-02-08: 4 mg via INTRAVENOUS

## 2016-02-08 MED ORDER — SODIUM CHLORIDE 0.9 % IV SOLN
250.0000 mL | INTRAVENOUS | Status: DC | PRN
  Administered 2016-02-09: 13:00:00 via INTRAVENOUS

## 2016-02-08 MED ORDER — MORPHINE SULFATE (PF) 2 MG/ML IV SOLN: 2.0000 mg | INTRAVENOUS | Status: DC | PRN

## 2016-02-08 NOTE — H&P (Signed)
[facility] - [place] at [facility]   PATIENT NAME: Roger Pennington    MR#:  [id number]  DATE OF BIRTH:  January 16, 1976  DATE OF ADMISSION:  02/08/2016  PRIMARY CARE PHYSICIAN: [facility]   REQUESTING/REFERRING PHYSICIAN: Dr. Manson Passey  CHIEF COMPLAINT:   Chief Complaint  Patient presents with  . Abdominal Pain  . Nausea  . Diarrhea    HISTORY OF PRESENT ILLNESS:  Roger Pennington  is a 40 y.o. male with a known history of peptic ulcer disease and tobacco use presents to the emergency room complaining of acute upper abdominal pain which started yesterday night and slowly worsened and blood in his stool. Patient does not use any NSAIDs or aspirin. Occasional alcohol with last drink 3 weeks back. While having his CT scan of the abdomen patient had vomiting with bright red blood. He does feel fatigued and dizziness. Hemoglobin is 13.5. When he was 40 year old patient had an EGD and was told that he had ulcers at [facility]. He had blood in his stool at that time. Not on any PPI or H2 blockers.  PAST MEDICAL HISTORY:   Past Medical History  Diagnosis Date  . Tobacco abuse     PAST SURGICAL HISTORY:  History reviewed. No pertinent past surgical history.  SOCIAL HISTORY:   Social History  Substance Use Topics  . Smoking status: Current Every Day Smoker  . Smokeless tobacco: Not on file  . Alcohol Use: Yes     Comment: occ    FAMILY HISTORY:   Family History  Problem Relation Age of Onset  . Hypertension Father   . Hypertension Brother     DRUG ALLERGIES:  No Known Allergies  REVIEW OF SYSTEMS:   Review of Systems  Constitutional: Positive for malaise/fatigue. Negative for fever, chills and weight loss.  HENT: Negative for hearing loss and nosebleeds.   Eyes: Negative for blurred vision, double vision and pain.  Respiratory: Negative for cough, hemoptysis, sputum production, shortness of breath and wheezing.   Cardiovascular:  Negative for chest pain, palpitations, orthopnea and leg swelling.  Gastrointestinal: Positive for nausea, vomiting, abdominal pain and blood in stool. Negative for diarrhea and constipation.  Genitourinary: Negative for dysuria and hematuria.  Musculoskeletal: Negative for myalgias, back pain and falls.  Skin: Negative for rash.  Neurological: Positive for dizziness and weakness. Negative for tremors, sensory change, speech change, focal weakness, seizures and headaches.  Endo/Heme/Allergies: Does not bruise/bleed easily.  Psychiatric/Behavioral: Negative for depression and memory loss. The patient is not nervous/anxious.     MEDICATIONS AT HOME:   Prior to Admission medications   Medication Sig Start Date End Date Taking? Authorizing Provider  cyclobenzaprine (FLEXERIL) 10 MG tablet Take 1 tablet (10 mg total) by mouth every 8 (eight) hours as needed for muscle spasms. 11/17/15   Evangeline Dakin, PA-C  HYDROcodone-acetaminophen (NORCO) 5-325 MG tablet Take 1-2 tablets by mouth every 4 (four) hours as needed for moderate pain. 11/17/15   Charmayne Sheer Beers, PA-C  naproxen (EC NAPROSYN) 500 MG EC tablet Take 1 tablet (500 mg total) by mouth 2 (two) times daily with a meal. 11/17/15   Evangeline Dakin, PA-C      VITAL SIGNS:  Blood pressure 143/92, pulse 89, temperature 98.3 F (36.8 C), temperature source Oral, resp. rate 18, height 6' (1.829 m), weight 109.317 kg (241 lb), SpO2 97 %.  PHYSICAL EXAMINATION:  Physical Exam  GENERAL:  40 y.o.-year-old patient lying in the bed with  acute distress  due to pain  EYES: Pupils equal, round, reactive to light and accommodation. No scleral icterus. Extraocular muscles intact.  HEENT: Head atraumatic, normocephalic. Oropharynx and nasopharynx clear. No oropharyngeal erythema, moist oral mucosa  NECK:  Supple, no jugular venous distention. No thyroid enlargement, no tenderness.  LUNGS: Normal breath sounds bilaterally, no wheezing, rales, rhonchi. No  use of accessory muscles of respiration.  CARDIOVASCULAR: S1, S2 normal. No murmurs, rubs, or gallops.  ABDOMEN: Soft. Tenderness epigastric and left upper quadrant area. No rigidity guarding. Bowel sounds normal.  EXTREMITIES: No pedal edema, cyanosis, or clubbing. + 2 pedal & radial pulses b/l.   NEUROLOGIC: Cranial nerves II through XII are intact. No focal Motor or sensory deficits appreciated b/l PSYCHIATRIC: The patient is alert and oriented x 3. Good affect.  SKIN: No obvious rash, lesion, or ulcer.   LABORATORY PANEL:   CBC  Recent Labs Lab 02/08/16 0749  WBC 6.5  HGB 13.5  HCT 43.1  PLT 176   ------------------------------------------------------------------------------------------------------------------  Chemistries   Recent Labs Lab 02/08/16 0749  NA 139  K 4.0  CL 106  CO2 28  GLUCOSE 113*  BUN 11  CREATININE 1.11  CALCIUM 9.1  AST 22  ALT 19  ALKPHOS 69  BILITOT 0.9   ------------------------------------------------------------------------------------------------------------------  Cardiac Enzymes No results for input(s): TROPONINI in the last 168 hours. ------------------------------------------------------------------------------------------------------------------  RADIOLOGY:  Ct Abdomen Pelvis W Contrast  02/08/2016  CLINICAL DATA:  Lower abdominal pain. Blood in stool. Burning abdominal pain. EXAM: CT ABDOMEN AND PELVIS WITH CONTRAST TECHNIQUE: Multidetector CT imaging of the abdomen and pelvis was performed using the standard protocol following bolus administration of intravenous contrast. CONTRAST:  OMNIPAQUE IOHEXOL 300 MG/ML  SOLN COMPARISON:  Chest radiograph of 06/05/2013 FINDINGS: The patient experienced vomiting after the injection. The vomiting appear to include blood. Lower chest: Contrast medium is present in the distal esophagus. No visible extravasation of gas or contrast in the vicinity of the lower thoracic esophagus.  Hepatobiliary: 9 mm hypodense lesion of the right hepatic lobe, image 20 series 2. Possible 4 mm lesion in the lateral segment left hepatic lobe, image 19 series 2. Questionable 4 mm lesion inferiorly in the right hepatic lobe, image 32 series 2. These lesions are technically too small to characterize although statistically likely to be cysts. No prior cross-sectional imaging for correlation. Gallbladder unremarkable, no biliary dilatation. Pancreas: Unremarkable Spleen: Unremarkable Adrenals/Urinary Tract: Adrenal glands normal. Hypodense 7 mm right mid to lower kidney lesion on image 31 series 2, remains hypodense on delayed images, likely a cyst but technically too small to characterize. There is a similar too small to characterize 7 mm lesion of the left mid kidney on image 21 series 2. A 2.2 by 1.8 cm lesion of the left kidney upper pole is fluid density on portal venous and delayed phase images, compatible with benign cyst. Stomach/Bowel: Wall thickening along the anterior gastric body, image 10 series 9 and image 25 series 2. However, this does not appear to persist on the delayed images. Appendix normal. Scattered air-fluid levels in the descending and sigmoid colon raise the possibility of diarrheal process. Orally administered contrast extends to the distal small bowel but not quite to the terminal most ileum. Vascular/Lymphatic: Unremarkable Reproductive: Mildly retracted right testicle along the spermatic cord. Other: No supplemental non-categorized findings. Musculoskeletal: Disc osteophyte complex at L5-S1 eccentric to the left with borderline left foraminal stenosis. Suspected right foraminal stenosis at T12-L1 due to facet arthropathy. IMPRESSION: 1. Somewhat  indistinct anterior wall thickening in the stomach body. This appears different from the posterior gastric wall, but on delayed phase images is much less apparent, raising the possibility of peristalsis as a cause. Given the presence of vomiting  apparently containing blood after the contrast injection, suspicion for possible upper GI bleed is raised and assessment of the esophagus and stomach should be considered. There is evidence of gastroesophageal reflux during the course of today's exam. 2. Air-fluid levels in the distal colon suspicious for diarrheal process. 3. Lumbar spondylosis and degenerative disc disease with suspected right foraminal stenosis at T12-L1 due to facet arthropathy. 4. Other imaging findings of potential clinical significance: Nonspecific hypodense hepatic and renal lesions are statistically likely to be small cysts. Mildly retracted right testicle along the spermatic cord. Electronically Signed   By: Gaylyn Rong M.D.   On: 02/08/2016 13:52     IMPRESSION AND PLAN:   * Upper GI bleed NPO Protonix drip We will bolus 1 L normal saline stat and put him on maintenance fluids. Serial hemoglobin. Acute bleeding patient's hemoglobin is still in the normal range but will likely drop. Consent obtained for transfusion in case he needs it. Discussed case with Dr. Marva Panda of GI and patient will get an EGD once fluid resuscitated. CT scan shows some gastric wall thickening but no ulceration or perforation.  * DVT prophylaxis with SCDs  Admitted to stepdown due to critical illness with acute upper GI bleed.  All the records are reviewed and case discussed with ED provider. Management plans discussed with the patient, family and they are in agreement.  CODE STATUS: FULL  TOTAL CC TIME TAKING CARE OF THIS PATIENT: 40 minutes.    Milagros Loll R M.D on 02/08/2016 at 2:07 PM  Between 7am to 6pm - Pager - [phone]  After 6pm go to [email or web address] - password [email or web address] [facility]  [facility]  Office  [phone]  CC: Primary care physician; [facility]     Note: This dictation was prepared with Dragon dictation along with smaller phrase technology. Any transcriptional errors that  result from this process are unintentional.

## 2016-02-08 NOTE — ED Notes (Signed)
Pt not given vitamins as he states he is nauseous.

## 2016-02-08 NOTE — Consult Note (Signed)
On arrival to endo unit for egd recheck of vitals indicated 101.8 temp and acute right sided abdominal pain.  I am concerned for possible perforated ulcer and fell this must be ruled out befote egd.  Will hold egd for now, stat abd films ordered, discussed with Dr Elpidio Anis.

## 2016-02-08 NOTE — ED Notes (Signed)
Patient transported to CT 

## 2016-02-08 NOTE — OR Nursing (Signed)
Appears to be feeling better.  Talking/laughing on cell phone and with visitor.

## 2016-02-08 NOTE — OR Nursing (Signed)
Report to Eugenie Norrie, RN in PACU.

## 2016-02-08 NOTE — Consult Note (Signed)
GI Inpatient Consult Note  Reason for Consult: GI Bleed   Attending Requesting Consult: Dr. Elpidio Anis  History of Present Illness: Roger Pennington is a 40 y.o. male seen for evaluation of upper GI bleed at the request of Dr. Elpidio Anis. He reports chronic issues with GERD and takes Nexium and Rolaids frequently for severe GERD symptoms. He has nausea with the untreated GERD which improves w/ Nexium, takes approximately 3x/week. He denies any vomiting. No dysphagia or odynophagia. He has had an unintentional 40lb weight loss over the past 3 months-dropped 2 pant sizes. No changes to diet, exercise level, etc. He takes Advil and aspirin  a few times per week.   He normally has a soft BM daily without any history of rectal bleeding. He reports last night developing lower abdominal pain bilaterally but worse in LLQ. Started after supper. This morning awoke w/ passing BRBPR without any stool. Also had small amount of BRB in his underpants when awakening. No rectal pain. Pain became more localized in LLQ. He then developed vomiting following drinking the CT contrast that ws also reported to have BRB-he is unsure of amount. His pain, nausea, vomiting are improved after the PPI gtt.    Last Colonoscopy: around age 57 Last Endoscopy: around age 45   Past Medical History:  Past Medical History  Diagnosis Date  . Tobacco abuse     Problem List: Patient Active Problem List   Diagnosis Date Noted  . Upper GI bleed 02/08/2016    Past Surgical History: History reviewed. No pertinent past surgical history.  Allergies: No Known Allergies  Home Medications:  (Not in a hospital admission) Home medication reconciliation was completed with the patient.   Scheduled Inpatient Medications:   . sodium chloride flush  3 mL Intravenous Q12H  . sodium chloride flush  3 mL Intravenous Q12H    Continuous Inpatient Infusions:   . sodium chloride    . sodium chloride    . pantoprozole (PROTONIX) infusion 8  mg/hr (02/08/16 1521)    PRN Inpatient Medications:  sodium chloride, morphine injection, ondansetron **OR** ondansetron (ZOFRAN) IV, sodium chloride flush  Family History: family history includes Hypertension in his brother and father.  Social History:  Uses marijuana occasional. 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 Smokes 1 PPD  Review of Systems: Constitutional: + weight loss Eyes: No changes in vision. ENT: No oral lesions, sore throat.  GI: see HPI.  Heme/Lymph: No easy bruising.  CV: No chest pain.  GU: No hematuria.  Integumentary: No rashes.  Neuro: No headaches.  Psych: No depression/anxiety.  Endocrine: No heat/cold intolerance.  Allergic/Immunologic: No urticaria.  Resp: No cough, SOB.  Musculoskeletal: No joint swelling.    Physical Examination: BP 135/87 mmHg  Pulse 91  Temp(Src) 98.3 F (36.8 C) (Oral)  Resp 18  Ht 6' (1.829 m)  Wt 241 lb (109.317 kg)  BMI 32.68 kg/m2  SpO2 97% Gen: NAD, alert and oriented x 4 HEENT: PEERLA, EOMI, Neck: supple, no JVD or thyromegaly Chest: CTA bilaterally, no wheezes, crackles, or other adventitious sounds CV: RRR, no m/g/c/r Abd: soft, NT, ND, +BS in all four quadrants; TTP in LLQ  Rectal - prominent external hemorrhoids without evidence of thrombosis, mucous w/ brown tint in rectal vault that is occult + faintly Ext: no edema, well perfused with 2+ pulses, Skin: no rash or lesions noted Lymph: no LAD  Data: Lab Results  Component Value Date   WBC  6.5 02/08/2016   HGB 13.5 02/08/2016   HCT 43.1 02/08/2016   MCV 80.6 02/08/2016   PLT 176 02/08/2016    Recent Labs Lab 02/08/16 0749  HGB 13.5   Lab Results  Component Value Date   NA 139 02/08/2016   K 4.0 02/08/2016   CL 106 02/08/2016   CO2 28 02/08/2016   BUN 11 02/08/2016   CREATININE 1.11 02/08/2016   Lab Results  Component Value Date   ALT 19 02/08/2016   AST 22 02/08/2016   ALKPHOS 69 02/08/2016    BILITOT 0.9 02/08/2016   No results for input(s): APTT, INR, PTT in the last 168 hours.    CT a/p IMPRESSION: 1. Somewhat indistinct anterior wall thickening in the stomach body. This appears different from the posterior gastric wall, but on delayed phase images is much less apparent, raising the possibility of peristalsis as a cause. Given the presence of vomiting apparently containing blood after the contrast injection, suspicion for possible upper GI bleed is raised and assessment of the esophagus and stomach should be considered. There is evidence of gastroesophageal reflux during the course of today's exam. 2. Air-fluid levels in the distal colon suspicious for diarrheal process. 3. Lumbar spondylosis and degenerative disc disease with suspected right foraminal stenosis at T12-L1 due to facet arthropathy. 4. Other imaging findings of potential clinical significance: Nonspecific hypodense hepatic and renal lesions are statistically likely to be small cysts. Mildly retracted right testicle along the spermatic cord  Assessment/Plan: Roger Pennington is a 40 y.o. male admitted for rectal bleeding and abdominal pain who developed hematemesis during CT scan.   1. Rectal bleeding/Hematemesis - will proceed w/ EGD this afternoon. In interim start on octreotide gtt and continue Protonix gtt. He is NPO for procedure. Broad differential including esophagitis, ulcer, duodenitis, AVM, variceal, neoplasm.  2. Abd pain - improved w/ PPI gtt. Evaluation as above.   3. Weight loss - if EGD unremarkable will need to be addressed as outpatient.   4. GERD - untreated, using PPI intermittently, will need PPI at d/c. Avoid NSAIds.    Recommendations:  1. EGD this afternoon 2. Start octreotide gtt 3. NPO until EGD 4. Continue PPI gtt.   *Case discussed w/ Dr. Marva Panda.   Thank you for the consult. Please call with questions or concerns.  Roger Kaufmann, PA-C [facility]

## 2016-02-08 NOTE — ED Notes (Signed)
Pt reports last night he began having lower abd pain, diarrhea and nausea. Pt reports that he noted some blood in his stool.

## 2016-02-08 NOTE — Consult Note (Signed)
Patient seen in CCU.  No further emesis and no bm. Patient comfortable with continued left abdominal pain.  Hemodynamically stable.  Continues with fever.  Blood cultures being drawn.   Will arrange for EGD tomorrow pm.  Continue serial hgb.  Following.

## 2016-02-08 NOTE — ED Notes (Signed)
Patient came back from CT scan. Per staff, patient vomited large amount of blood while in CT. Dr. Manson Passey aware.

## 2016-02-08 NOTE — ED Notes (Signed)
Pt presents with abdominal pain since last night. States he has never had pain like this before. Reports "a lot" of blood in his stool this morning with bright red blood in his diarrhea. Denies increased frequency of urination or pain/burning upon urination. States that the pain in his abdomen feels like burning.

## 2016-02-08 NOTE — ED Provider Notes (Signed)
[facility] Emergency Department Provider Note  ____________________________________________  Time seen: 10:20 AM  I have reviewed the triage vital signs and the nursing notes.   HISTORY  Chief Complaint Abdominal Pain; Nausea; and Diarrhea    HPI Roger Pennington is a 40 y.o. male presents with lower abdominal pain is currently 8 out of 10 accompanied by diarrhea which patient states has become bloody. Patient also admits to nausea however no vomiting at this time. Patient admits to occasional alcohol use last occasion was "long time ago.   Past medical history None There are no active problems to display for this patient.   Past Surgical history None Current Outpatient Rx  Name  Route  Sig  Dispense  Refill  . cyclobenzaprine (FLEXERIL) 10 MG tablet   Oral   Take 1 tablet (10 mg total) by mouth every 8 (eight) hours as needed for muscle spasms.   30 tablet   1   . HYDROcodone-acetaminophen (NORCO) 5-325 MG tablet   Oral   Take 1-2 tablets by mouth every 4 (four) hours as needed for moderate pain.   10 tablet   0   . naproxen (EC NAPROSYN) 500 MG EC tablet   Oral   Take 1 tablet (500 mg total) by mouth 2 (two) times daily with a meal.   60 tablet   0     Allergies No known drug allergies No family history on file.  Social History Social History  Substance Use Topics  . Smoking status: Current Every Day Smoker  . Smokeless tobacco: None  . Alcohol Use: Yes     Comment: occ    Review of Systems  Constitutional: Negative for fever. Eyes: Negative for visual changes. ENT: Negative for sore throat. Cardiovascular: Negative for chest pain. Respiratory: Negative for shortness of breath. Gastrointestinal: Positive for abdominal pain and diarrhea Genitourinary: Negative for dysuria. Musculoskeletal: Negative for back pain. Skin: Negative for rash. Neurological: Negative for headaches, focal weakness or numbness.   10-point ROS  otherwise negative.  ____________________________________________   PHYSICAL EXAM:  VITAL SIGNS: ED Triage Vitals  Enc Vitals Group     BP 02/08/16 0741 137/89 mmHg     Pulse Rate 02/08/16 0741 79     Resp 02/08/16 0741 20     Temp 02/08/16 0741 98.3 F (36.8 C)     Temp Source 02/08/16 0741 Oral     SpO2 02/08/16 0741 98 %     Weight 02/08/16 0741 241 lb (109.317 kg)     Height 02/08/16 0741 6' (1.829 m)     Head Cir --      Peak Flow --      Pain Score 02/08/16 0748 10     Pain Loc --      Pain Edu? --      Excl. in GC? --      Constitutional: Alert and oriented. Well appearing and in no distress. Eyes: Conjunctivae are normal. PERRL. Normal extraocular movements. ENT   Head: Normocephalic and atraumatic.   Nose: No congestion/rhinnorhea.   Mouth/Throat: Mucous membranes are moist.   Neck: No stridor. Hematological/Lymphatic/Immunilogical: No cervical lymphadenopathy. Cardiovascular: Normal rate, regular rhythm. Normal and symmetric distal pulses are present in all extremities. No murmurs, rubs, or gallops. Respiratory: Normal respiratory effort without tachypnea nor retractions. Breath sounds are clear and equal bilaterally. No wheezes/rales/rhonchi. Gastrointestinal: Right lower quadrant/left lower quadrant pain with palpation. No distention. There is no CVA tenderness. Genitourinary: deferred Musculoskeletal: Nontender with normal  range of motion in all extremities. No joint effusions.  No lower extremity tenderness nor edema. Neurologic:  Normal speech and language. No gross focal neurologic deficits are appreciated. Speech is normal.  Skin:  Skin is warm, dry and intact. No rash noted. Psychiatric: Mood and affect are normal. Speech and behavior are normal. Patient exhibits appropriate insight and judgment.  ____________________________________________    LABS (pertinent positives/negatives)  Labs Reviewed  COMPREHENSIVE METABOLIC PANEL -  Abnormal; Notable for the following:    Glucose, Bld 113 (*)    All other components within normal limits  CBC - Abnormal; Notable for the following:    MCH 25.3 (*)    MCHC 31.4 (*)    All other components within normal limits  LIPASE, BLOOD  URINALYSIS COMPLETEWITH MICROSCOPIC ([facility] ONLY)      RADIOLOGY     CT Abdomen Pelvis W Contrast (Final result) Result time: 02/08/16 13:52:12   Final result by Rad Results In Interface (02/08/16 13:52:12)   Narrative:   CLINICAL DATA: Lower abdominal pain. Blood in stool. Burning abdominal pain.  EXAM: CT ABDOMEN AND PELVIS WITH CONTRAST  TECHNIQUE: Multidetector CT imaging of the abdomen and pelvis was performed using the standard protocol following bolus administration of intravenous contrast.  CONTRAST: OMNIPAQUE IOHEXOL 300 MG/ML SOLN  COMPARISON: Chest radiograph of 06/05/2013  FINDINGS: The patient experienced vomiting after the injection. The vomiting appear to include blood.  Lower chest: Contrast medium is present in the distal esophagus. No visible extravasation of gas or contrast in the vicinity of the lower thoracic esophagus.  Hepatobiliary: 9 mm hypodense lesion of the right hepatic lobe, image 20 series 2. Possible 4 mm lesion in the lateral segment left hepatic lobe, image 19 series 2. Questionable 4 mm lesion inferiorly in the right hepatic lobe, image 32 series 2. These lesions are technically too small to characterize although statistically likely to be cysts. No prior cross-sectional imaging for correlation. Gallbladder unremarkable, no biliary dilatation.  Pancreas: Unremarkable  Spleen: Unremarkable  Adrenals/Urinary Tract: Adrenal glands normal. Hypodense 7 mm right mid to lower kidney lesion on image 31 series 2, remains hypodense on delayed images, likely a cyst but technically too small to characterize. There is a similar too small to characterize 7 mm lesion of the left mid  kidney on image 21 series 2. A 2.2 by 1.8 cm lesion of the left kidney upper pole is fluid density on portal venous and delayed phase images, compatible with benign cyst.  Stomach/Bowel: Wall thickening along the anterior gastric body, image 10 series 9 and image 25 series 2. However, this does not appear to persist on the delayed images.  Appendix normal. Scattered air-fluid levels in the descending and sigmoid colon raise the possibility of diarrheal process. Orally administered contrast extends to the distal small bowel but not quite to the terminal most ileum.  Vascular/Lymphatic: Unremarkable  Reproductive: Mildly retracted right testicle along the spermatic cord.  Other: No supplemental non-categorized findings.  Musculoskeletal: Disc osteophyte complex at L5-S1 eccentric to the left with borderline left foraminal stenosis. Suspected right foraminal stenosis at T12-L1 due to facet arthropathy.  IMPRESSION: 1. Somewhat indistinct anterior wall thickening in the stomach body. This appears different from the posterior gastric wall, but on delayed phase images is much less apparent, raising the possibility of peristalsis as a cause. Given the presence of vomiting apparently containing blood after the contrast injection, suspicion for possible upper GI bleed is raised and assessment of the esophagus and stomach  should be considered. There is evidence of gastroesophageal reflux during the course of today's exam. 2. Air-fluid levels in the distal colon suspicious for diarrheal process. 3. Lumbar spondylosis and degenerative disc disease with suspected right foraminal stenosis at T12-L1 due to facet arthropathy. 4. Other imaging findings of potential clinical significance: Nonspecific hypodense hepatic and renal lesions are statistically likely to be small cysts. Mildly retracted right testicle along the spermatic cord.   Electronically Signed By: Gaylyn Rong  M.D. On: 02/08/2016 13:52       ____________________________________________   INITIAL IMPRESSION / ASSESSMENT AND PLAN / ED COURSE  Pertinent labs & imaging results that were available during my care of the patient were reviewed by me and considered in my medical decision making (see chart for details).  Patient had a large amount of bloody emesis when taken to CT. Patient remains hemodynamically stable initial H&H normal. However given history of physical exam concern for possible brisk upper GI bleed. As such patient was discussed with Dr. Marva Panda gastroenterologist on-call following Protonix bolus of 80 mg as well as Protonix drip. Dr. Marva Panda concurs with plan thus far. Patient discussed with Dr. Elpidio Anis hospital's on-call for ICU admission.  ____________________________________________   FINAL CLINICAL IMPRESSION(S) / ED DIAGNOSES  Final diagnoses:  Acute upper GI bleed      Darci Current, MD 02/09/16 1529

## 2016-02-08 NOTE — Progress Notes (Signed)
Received report from Endo RN.  Pt in PACU waiting on ICU bed.  NAD.  Family at bedside.

## 2016-02-08 NOTE — Consult Note (Signed)
Please see full GI consult by Ms Henreitta Leber.  Patient seen and examined, chart reviewed.  19 male comes to er after having bright red rectal bleeding this am, with lower abdominal pain.   He went to the CT scanner  And had a episode of acute bloody hematemesis.  He occasionally uses asa and nsiads. He has not had etoh for about 3 weeks but had been a drinker for over 20 years.  Continue iv ppi drip, will start octreotide drip and obtain second iv .  Will arrange for egd when clinically feasible.  I have discussed the risks benefits and complications of procedures to include not limited to bleeding, infection, perforation and the risk of sedation and the patient wishes to proceed.

## 2016-02-08 NOTE — ED Notes (Signed)
Pt to endoscopy; wife is in surgical waiting; please call when complete. Tonia Brooms [phone]

## 2016-02-09 ENCOUNTER — Encounter: Payer: Self-pay | Admitting: Anesthesiology

## 2016-02-09 ENCOUNTER — Inpatient Hospital Stay: Payer: MEDICAID | Admitting: Anesthesiology

## 2016-02-09 ENCOUNTER — Encounter
Admission: EM | Disposition: A | Discharge status: Home / Self Care | Payer: Self-pay | Source: Home / Self Care | Attending: Internal Medicine

## 2016-02-09 HISTORY — PX: ESOPHAGOGASTRODUODENOSCOPY (EGD) WITH PROPOFOL: SHX5813

## 2016-02-09 LAB — CBC WITH DIFFERENTIAL/PLATELET
BASOS ABS: 0 10*3/uL (ref 0–0.1)
BASOS PCT: 0 %
Eosinophils Absolute: 0.2 10*3/uL (ref 0–0.7)
Eosinophils Relative: 4 %
HEMATOCRIT: 37.1 % — AB (ref 40.0–52.0)
HEMOGLOBIN: 11.7 g/dL — AB (ref 13.0–18.0)
LYMPHS PCT: 34 %
Lymphs Abs: 1.6 10*3/uL (ref 1.0–3.6)
MCH: 25.1 pg — ABNORMAL LOW (ref 26.0–34.0)
MCHC: 31.7 g/dL — ABNORMAL LOW (ref 32.0–36.0)
MCV: 79.3 fL — AB (ref 80.0–100.0)
MONO ABS: 0.5 10*3/uL (ref 0.2–1.0)
Monocytes Relative: 11 %
NEUTROS ABS: 2.4 10*3/uL (ref 1.4–6.5)
NEUTROS PCT: 51 %
Platelets: 132 10*3/uL — ABNORMAL LOW (ref 150–440)
RBC: 4.68 MIL/uL (ref 4.40–5.90)
RDW: 13.7 % (ref 11.5–14.5)
WBC: 4.7 10*3/uL (ref 3.8–10.6)

## 2016-02-09 LAB — URINALYSIS COMPLETE WITH MICROSCOPIC (ARMC ONLY)
Bacteria, UA: NONE SEEN
Bilirubin Urine: NEGATIVE
Glucose, UA: NEGATIVE mg/dL
Hgb urine dipstick: NEGATIVE
LEUKOCYTES UA: NEGATIVE
NITRITE: NEGATIVE
PH: 5 (ref 5.0–8.0)
PROTEIN: NEGATIVE mg/dL
SQUAMOUS EPITHELIAL / LPF: NONE SEEN
Specific Gravity, Urine: 1.031 — ABNORMAL HIGH (ref 1.005–1.030)

## 2016-02-09 LAB — INFLUENZA PANEL BY PCR (TYPE A & B)
H1N1FLUPCR: NOT DETECTED
INFLBPCR: NEGATIVE
Influenza A By PCR: NEGATIVE

## 2016-02-09 LAB — HEMOGLOBIN: Hemoglobin: 11.9 g/dL — ABNORMAL LOW (ref 13.0–18.0)

## 2016-02-09 SURGERY — ESOPHAGOGASTRODUODENOSCOPY (EGD) WITH PROPOFOL
Anesthesia: General

## 2016-02-09 MED ORDER — HYDROCORTISONE 2.5 % RE CREA
TOPICAL_CREAM | Freq: Four times a day (QID) | RECTAL | Status: DC
  Filled 2016-02-09: qty 28.35

## 2016-02-09 MED ORDER — DEXMEDETOMIDINE HCL 200 MCG/2ML IV SOLN
INTRAVENOUS | Status: DC | PRN
  Administered 2016-02-09: 8 ug via INTRAVENOUS
  Administered 2016-02-09: 12 ug via INTRAVENOUS
  Administered 2016-02-09: 8 ug via INTRAVENOUS

## 2016-02-09 MED ORDER — PROPOFOL 10 MG/ML IV BOLUS
INTRAVENOUS | Status: DC | PRN
  Administered 2016-02-09: 20 mg via INTRAVENOUS
  Administered 2016-02-09: 30 mg via INTRAVENOUS

## 2016-02-09 MED ORDER — MIDAZOLAM HCL 2 MG/2ML IJ SOLN
INTRAMUSCULAR | Status: DC | PRN
Start: 2016-02-09 — End: 2016-02-09
  Administered 2016-02-09: 2 mg via INTRAVENOUS

## 2016-02-09 MED ORDER — SODIUM CHLORIDE 0.9 % IV SOLN
25.0000 ug/h | INTRAVENOUS | Status: DC
  Administered 2016-02-09: 25 ug/h via INTRAVENOUS
  Filled 2016-02-09 (×2): qty 1

## 2016-02-09 MED ORDER — PROPOFOL 500 MG/50ML IV EMUL
INTRAVENOUS | Status: DC | PRN
  Administered 2016-02-09: 100 ug/kg/min via INTRAVENOUS

## 2016-02-09 MED ORDER — FENTANYL CITRATE (PF) 100 MCG/2ML IJ SOLN
INTRAMUSCULAR | Status: DC | PRN
  Administered 2016-02-09: 50 ug via INTRAVENOUS

## 2016-02-09 MED ORDER — PANTOPRAZOLE SODIUM 40 MG IV SOLR
40.0000 mg | Freq: Two times a day (BID) | INTRAVENOUS | Status: DC
  Administered 2016-02-09: 40 mg via INTRAVENOUS
  Filled 2016-02-09: qty 40

## 2016-02-09 NOTE — H&P (Addendum)
Subjective: Patient seen for rectal bleeding and hematemesis.    patietn did well overnight, no further emesis, no nausea abdominal pain resolved.  No melena or other rectal bleeding.   Objective: Vital signs in last 24 hours: Temp:  [98.2 F (36.8 C)-101.8 F (38.8 C)] 98.2 F (36.8 C) (02/22 1244) Pulse Rate:  [68-112] 68 (02/22 1244) Resp:  [9-25] 11 (02/22 0700) BP: (125-153)/(72-92) 133/92 mmHg (02/22 1244) SpO2:  [94 %-100 %] 97 % (02/22 0700) Weight:  [109.4 kg (241 lb 2.9 oz)] 109.4 kg (241 lb 2.9 oz) (02/22 0630) Blood pressure 133/92, pulse 68, temperature 98.2 F (36.8 C), temperature source Oral, resp. rate 11, height 6' (1.829 m), weight 109.4 kg (241 lb 2.9 oz), SpO2 97 %.   Intake/Output from previous day: 02/21 0701 - 02/22 0700 In: 2394.6 [I.V.:2394.6] Out: -   Intake/Output this shift:     General appearance:  Well appearing 2 male no distress.  Resp:  bilat cta Cardio:  rrr without rmg GI:  Soft nondistended nontender, bowel sounds positive Extremities:  No cce   Lab Results: Results for orders placed or performed during the hospital encounter of 02/08/16 (from the past 24 hour(s))  CBC with Differential/Platelet     Status: Abnormal   Collection Time: 02/08/16  3:18 PM  Result Value Ref Range   WBC 7.1 3.8 - 10.6 K/uL   RBC 5.11 4.40 - 5.90 MIL/uL   Hemoglobin 12.8 (L) 13.0 - 18.0 g/dL   HCT 11.9 14.7 - 82.9 %   MCV 80.7 80.0 - 100.0 fL   MCH 25.0 (L) 26.0 - 34.0 pg   MCHC 31.0 (L) 32.0 - 36.0 g/dL   RDW 56.2 13.0 - 86.5 %   Platelets 142 (L) 150 - 440 K/uL   Neutrophils Relative % 79 %   Neutro Abs 5.7 1.4 - 6.5 K/uL   Lymphocytes Relative 12 %   Lymphs Abs 0.8 (L) 1.0 - 3.6 K/uL   Monocytes Relative 6 %   Monocytes Absolute 0.4 0.2 - 1.0 K/uL   Eosinophils Relative 3 %   Eosinophils Absolute 0.2 0 - 0.7 K/uL   Basophils Relative 0 %   Basophils Absolute 0.0 0 - 0.1 K/uL  Hemoglobin     Status: Abnormal   Collection Time: 02/08/16  7:56  PM  Result Value Ref Range   Hemoglobin 12.2 (L) 13.0 - 18.0 g/dL  Culture, blood (routine x 2)     Status: None (Preliminary result)   Collection Time: 02/08/16  7:56 PM  Result Value Ref Range   Specimen Description BLOOD RIGHT ARM    Special Requests BOTTLES DRAWN AEROBIC AND ANAEROBIC    Culture NO GROWTH < 12 HOURS    Report Status PENDING   Culture, blood (routine x 2)     Status: None (Preliminary result)   Collection Time: 02/08/16  8:07 PM  Result Value Ref Range   Specimen Description BLOOD LEFT HAND    Special Requests BOTTLES DRAWN AEROBIC AND ANAEROBIC    Culture NO GROWTH < 12 HOURS    Report Status PENDING   MRSA PCR Screening     Status: None   Collection Time: 02/08/16  9:04 PM  Result Value Ref Range   MRSA by PCR NEGATIVE NEGATIVE  CBC with Differential/Platelet     Status: Abnormal   Collection Time: 02/09/16  4:46 AM  Result Value Ref Range   WBC 4.7 3.8 - 10.6 K/uL   RBC 4.68 4.40 -  5.90 MIL/uL   Hemoglobin 11.7 (L) 13.0 - 18.0 g/dL   HCT 29.5 (L) 62.1 - 30.8 %   MCV 79.3 (L) 80.0 - 100.0 fL   MCH 25.1 (L) 26.0 - 34.0 pg   MCHC 31.7 (L) 32.0 - 36.0 g/dL   RDW 65.7 84.6 - 96.2 %   Platelets 132 (L) 150 - 440 K/uL   Neutrophils Relative % 51 %   Neutro Abs 2.4 1.4 - 6.5 K/uL   Lymphocytes Relative 34 %   Lymphs Abs 1.6 1.0 - 3.6 K/uL   Monocytes Relative 11 %   Monocytes Absolute 0.5 0.2 - 1.0 K/uL   Eosinophils Relative 4 %   Eosinophils Absolute 0.2 0 - 0.7 K/uL   Basophils Relative 0 %   Basophils Absolute 0.0 0 - 0.1 K/uL  Hemoglobin     Status: Abnormal   Collection Time: 02/09/16  8:22 AM  Result Value Ref Range   Hemoglobin 11.9 (L) 13.0 - 18.0 g/dL  Urinalysis complete, with microscopic ([facility] only)     Status: Abnormal   Collection Time: 02/09/16  8:33 AM  Result Value Ref Range   Color, Urine YELLOW (A) YELLOW   APPearance CLEAR (A) CLEAR   Glucose, UA NEGATIVE NEGATIVE mg/dL   Bilirubin Urine NEGATIVE NEGATIVE   Ketones, ur  TRACE (A) NEGATIVE mg/dL   Specific Gravity, Urine 1.031 (H) 1.005 - 1.030   Hgb urine dipstick NEGATIVE NEGATIVE   pH 5.0 5.0 - 8.0   Protein, ur NEGATIVE NEGATIVE mg/dL   Nitrite NEGATIVE NEGATIVE   Leukocytes, UA NEGATIVE NEGATIVE   RBC / HPF 0-5 0 - 5 RBC/hpf   WBC, UA 0-5 0 - 5 WBC/hpf   Bacteria, UA NONE SEEN NONE SEEN   Squamous Epithelial / LPF NONE SEEN NONE SEEN   Mucous PRESENT   Influenza panel by PCR (type A & B, H1N1)     Status: None   Collection Time: 02/09/16  8:33 AM  Result Value Ref Range   Influenza A By PCR NEGATIVE NEGATIVE   Influenza B By PCR NEGATIVE NEGATIVE   H1N1 flu by pcr NOT DETECTED NOT DETECTED      Recent Labs  02/08/16 0749 02/08/16 1518 02/08/16 1956 02/09/16 0446 02/09/16 0822  WBC 6.5 7.1  --  4.7  --   HGB 13.5 12.8* 12.2* 11.7* 11.9*  HCT 43.1 41.2  --  37.1*  --   PLT 176 142*  --  132*  --    BMET  Recent Labs  02/08/16 0749  NA 139  K 4.0  CL 106  CO2 28  GLUCOSE 113*  BUN 11  CREATININE 1.11  CALCIUM 9.1   LFT  Recent Labs  02/08/16 0749  PROT 7.5  ALBUMIN 4.4  AST 22  ALT 19  ALKPHOS 69  BILITOT 0.9   PT/INR  Recent Labs  02/08/16 0749  LABPROT 14.3  INR 1.09   Hepatitis Panel No results for input(s): HEPBSAG, HCVAB, HEPAIGM, HEPBIGM in the last 72 hours. C-Diff No results for input(s): CDIFFTOX in the last 72 hours. No results for input(s): CDIFFPCR in the last 72 hours.   Studies/Results: Ct Abdomen Pelvis W Contrast  02/08/2016  CLINICAL DATA:  Lower abdominal pain. Blood in stool. Burning abdominal pain. EXAM: CT ABDOMEN AND PELVIS WITH CONTRAST TECHNIQUE: Multidetector CT imaging of the abdomen and pelvis was performed using the standard protocol following bolus administration of intravenous contrast. CONTRAST:  OMNIPAQUE IOHEXOL 300 MG/ML  SOLN COMPARISON:  Chest radiograph of 06/05/2013 FINDINGS: The patient experienced vomiting after the injection. The vomiting appear to include  blood. Lower chest: Contrast medium is present in the distal esophagus. No visible extravasation of gas or contrast in the vicinity of the lower thoracic esophagus. Hepatobiliary: 9 mm hypodense lesion of the right hepatic lobe, image 20 series 2. Possible 4 mm lesion in the lateral segment left hepatic lobe, image 19 series 2. Questionable 4 mm lesion inferiorly in the right hepatic lobe, image 32 series 2. These lesions are technically too small to characterize although statistically likely to be cysts. No prior cross-sectional imaging for correlation. Gallbladder unremarkable, no biliary dilatation. Pancreas: Unremarkable Spleen: Unremarkable Adrenals/Urinary Tract: Adrenal glands normal. Hypodense 7 mm right mid to lower kidney lesion on image 31 series 2, remains hypodense on delayed images, likely a cyst but technically too small to characterize. There is a similar too small to characterize 7 mm lesion of the left mid kidney on image 21 series 2. A 2.2 by 1.8 cm lesion of the left kidney upper pole is fluid density on portal venous and delayed phase images, compatible with benign cyst. Stomach/Bowel: Wall thickening along the anterior gastric body, image 10 series 9 and image 25 series 2. However, this does not appear to persist on the delayed images. Appendix normal. Scattered air-fluid levels in the descending and sigmoid colon raise the possibility of diarrheal process. Orally administered contrast extends to the distal small bowel but not quite to the terminal most ileum. Vascular/Lymphatic: Unremarkable Reproductive: Mildly retracted right testicle along the spermatic cord. Other: No supplemental non-categorized findings. Musculoskeletal: Disc osteophyte complex at L5-S1 eccentric to the left with borderline left foraminal stenosis. Suspected right foraminal stenosis at T12-L1 due to facet arthropathy. IMPRESSION: 1. Somewhat indistinct anterior wall thickening in the stomach body. This appears different  from the posterior gastric wall, but on delayed phase images is much less apparent, raising the possibility of peristalsis as a cause. Given the presence of vomiting apparently containing blood after the contrast injection, suspicion for possible upper GI bleed is raised and assessment of the esophagus and stomach should be considered. There is evidence of gastroesophageal reflux during the course of today's exam. 2. Air-fluid levels in the distal colon suspicious for diarrheal process. 3. Lumbar spondylosis and degenerative disc disease with suspected right foraminal stenosis at T12-L1 due to facet arthropathy. 4. Other imaging findings of potential clinical significance: Nonspecific hypodense hepatic and renal lesions are statistically likely to be small cysts. Mildly retracted right testicle along the spermatic cord. Electronically Signed   By: Gaylyn Rong M.D.   On: 02/08/2016 13:52   Dg Abd Acute W/chest  02/08/2016  CLINICAL DATA:  Acute left upper quadrant abdominal pain.  Pre EGD. EXAM: DG ABDOMEN ACUTE W/ 1V CHEST COMPARISON:  Chest radiograph 06/05/2013. CT abdomen/pelvis earlier this day at 1318 hour. FINDINGS: The cardiomediastinal contours are normal. The lungs are clear. There is no free intra-abdominal air. No dilated bowel loops to suggest obstruction. Enteric contrast throughout the colon from prior CT. Excreted intravenous contrast within the urinary bladder and renal collecting systems. No acute osseous abnormalities are seen. IMPRESSION: 1. Clear lungs. 2. Normal bowel gas pattern. Enteric contrast throughout the colon from prior CT. No free air. Electronically Signed   By: Rubye Oaks M.D.   On: 02/08/2016 18:18    Scheduled Inpatient Medications:   . [MAR Hold] folic acid  1 mg Oral Daily  . [MAR Hold] multivitamin with  minerals  1 tablet Oral Daily  . octreotide  50 mcg Intravenous Once  . [MAR Hold] sodium chloride flush  3 mL Intravenous Q12H  . [MAR Hold] sodium  chloride flush  3 mL Intravenous Q12H  . [MAR Hold] thiamine  100 mg Oral Daily   Or  . [MAR Hold] thiamine  100 mg Intravenous Daily    Continuous Inpatient Infusions:   . sodium chloride 1,000 mL (02/08/16 1745)  . octreotide  (SANDOSTATIN)    IV infusion 50 mcg/hr (02/08/16 1707)  . pantoprozole (PROTONIX) infusion 8 mg/hr (02/08/16 1521)    PRN Inpatient Medications:  [MAR Hold] sodium chloride, [MAR Hold]  HYDROmorphone (DILAUDID) injection, [MAR Hold] LORazepam **OR** [MAR Hold] LORazepam, [MAR Hold] ondansetron **OR** [MAR Hold] ondansetron (ZOFRAN) IV, [MAR Hold] sodium chloride flush  Miscellaneous:   Assessment:  1) rectal bleeding , inflamed hemorrhoids noted externally./   2) hematemesis-stable no recurrence  Plan:  1) EGD today. I have discussed the risks benefits and complications of procedures to include not limited to bleeding, infection, perforation and the risk of sedation and the patient wishes to proceed. 2) will need treatment with anal pram 2.5% HC cream tid for 10 days, citrucel one dose daily.   Christena Deem MD 02/09/2016, 12:50 PM

## 2016-02-09 NOTE — Transfer of Care (Signed)
Immediate Anesthesia Transfer of Care Note  Patient: Roger Pennington  Procedure(s) Performed: Procedure(s): ESOPHAGOGASTRODUODENOSCOPY (EGD) WITH PROPOFOL (N/A)  Patient Location: PACU  Anesthesia Type:General  Level of Consciousness: awake, alert  and oriented  Airway & Oxygen Therapy: Patient Spontanous Breathing and Patient connected to nasal cannula oxygen  Post-op Assessment: Report given to RN and Post -op Vital signs reviewed and stable  Post vital signs: Reviewed and stable  Last Vitals:  Filed Vitals:   02/09/16 0700 02/09/16 1244  BP: 129/84 133/92  Pulse: 74 68  Temp:  36.8 C  Resp: 11     Complications: No apparent anesthesia complications

## 2016-02-09 NOTE — Progress Notes (Signed)
VSS, pt had no episodes of bloody stools or vomiting, pt rested throughout night.

## 2016-02-09 NOTE — Op Note (Signed)
[facility] Gastroenterology Patient Name: Roger Pennington Procedure Date: 02/09/2016 1:02 PM MRN: [id number] Account #: [id number] Date of Birth: March 13, 1976 Admit Type: Inpatient Age: 40 Room: [facility] ENDO ROOM 4 Gender: Male Note Status: Finalized Procedure:            Upper GI endoscopy Indications:          Abdominal pain in the left upper quadrant, Hematemesis Providers:            Christena Deem, MD Referring MD:         Dr Leonette Most drew clinic, MD (Referring MD) Medicines:            Monitored Anesthesia Care Complications:        No immediate complications. Procedure:            Pre-Anesthesia Assessment:                       - ASA Grade Assessment: III - A patient with severe                        systemic disease.                       After obtaining informed consent, the endoscope was                        passed under direct vision. Throughout the procedure,                        the patient's blood pressure, pulse, and oxygen                        saturations were monitored continuously. The Endoscope                        was introduced through the mouth, and advanced to the                        third part of duodenum. The upper GI endoscopy was                        accomplished without difficulty. The patient tolerated                        the procedure. Findings:      LA Grade C (one or more mucosal breaks continuous between tops of 2 or       more mucosal folds, less than 75% circumference) esophagitis with       friable bleeding. was found.      Localized moderate inflammation characterized by erythema was found in       the cardia, likely related to emesis.      A small hiatal hernia was present.      One non-bleeding cratered gastric ulcer with no stigmata of bleeding was       found in the prepyloric region of the stomach. The lesion was 4 mm in       largest dimension.      The cardia and gastric fundus were normal on retroflexion  otherwise.      Patchy mild inflammation characterized by congestion (edema) and       erythema was  found in the duodenal bulb and in the second portion of the       duodenum.      The exam of the duodenum was otherwise normal. Impression:           - LA Grade C erosive esophagitis.                       - Erosive gastritis.                       - Small hiatal hernia.                       - Non-bleeding gastric ulcer with no stigmata of                        bleeding.                       - Duodenitis.                       - No specimens collected. Recommendation:       - Return patient to hospital ward for ongoing care.                       - Use Protonix (pantoprazole) 40 mg IV BID today.                       - Use Protonix (pantoprazole) 40 mg PO BID starting                        tomorrow. Procedure Code(s):    --- Professional ---                       [phone], Esophagogastroduodenoscopy, flexible, transoral;                        diagnostic, including collection of specimen(s) by                        brushing or washing, when performed (separate procedure) Diagnosis Code(s):    --- Professional ---                       K20.8, Other esophagitis                       K29.60, Other gastritis without bleeding                       K44.9, Diaphragmatic hernia without obstruction or                        gangrene                       K25.9, Gastric ulcer, unspecified as acute or chronic,                        without hemorrhage or perforation                       K29.80, Duodenitis without bleeding  R10.12, Left upper quadrant pain                       K92.0, Hematemesis CPT copyright 2016 American [employer]. All rights reserved. The codes documented in this report are preliminary and upon coder review may  be revised to meet current compliance requirements. Christena Deem, MD 02/09/2016 1:35:26 PM This report has been signed  electronically. Number of Addenda: 0 Note Initiated On: 02/09/2016 1:02 PM      [facility]

## 2016-02-09 NOTE — Anesthesia Preprocedure Evaluation (Signed)
Anesthesia Evaluation  Patient identified by MRN, date of birth, ID band Patient awake    Reviewed: Allergy & Precautions, H&P , NPO status , Patient's Chart, lab work & pertinent test results, reviewed documented beta blocker date and time   History of Anesthesia Complications Negative for: history of anesthetic complications  Airway Mallampati: III  TM Distance: >3 FB Neck ROM: full    Dental no notable dental hx. (+) Teeth Intact   Pulmonary neg shortness of breath, neg sleep apnea, neg COPD, neg recent URI, Current Smoker,    Pulmonary exam normal breath sounds clear to auscultation       Cardiovascular Exercise Tolerance: Good negative cardio ROS Normal cardiovascular exam Rhythm:regular Rate:Normal     Neuro/Psych negative neurological ROS  negative psych ROS   GI/Hepatic Neg liver ROS, GERD  ,  Endo/Other  negative endocrine ROS  Renal/GU negative Renal ROS  negative genitourinary   Musculoskeletal   Abdominal   Peds  Hematology negative hematology ROS (+)   Anesthesia Other Findings Past Medical History:   Tobacco abuse                                                Reproductive/Obstetrics negative OB ROS                             Anesthesia Physical Anesthesia Plan  ASA: II  Anesthesia Plan: General   Post-op Pain Management:    Induction:   Airway Management Planned:   Additional Equipment:   Intra-op Plan:   Post-operative Plan:   Informed Consent: I have reviewed the patients History and Physical, chart, labs and discussed the procedure including the risks, benefits and alternatives for the proposed anesthesia with the patient or authorized representative who has indicated his/her understanding and acceptance.   Dental Advisory Given  Plan Discussed with: Anesthesiologist, CRNA and Surgeon  Anesthesia Plan Comments:         Anesthesia Quick  Evaluation

## 2016-02-09 NOTE — Anesthesia Postprocedure Evaluation (Signed)
Anesthesia Post Note  Patient: Roger Pennington  Procedure(s) Performed: Procedure(s) (LRB): ESOPHAGOGASTRODUODENOSCOPY (EGD) WITH PROPOFOL (N/A)  Patient location during evaluation: Endoscopy Anesthesia Type: General Level of consciousness: awake and alert Pain management: pain level controlled Vital Signs Assessment: post-procedure vital signs reviewed and stable Respiratory status: spontaneous breathing, nonlabored ventilation, respiratory function stable and patient connected to nasal cannula oxygen Cardiovascular status: blood pressure returned to baseline and stable Postop Assessment: no signs of nausea or vomiting Anesthetic complications: no    Last Vitals:  Filed Vitals:   02/09/16 1334 02/09/16 1343  BP: 130/86 128/84  Pulse: 74 71  Temp: 36.1 C   Resp: 19 18    Last Pain:  Filed Vitals:   02/09/16 1347  PainSc: Asleep                 Lenard Simmer

## 2016-02-09 NOTE — Progress Notes (Signed)
[facility] [place] at [facility]   PATIENT NAME: Roger Pennington    MR#:  [id number]  DATE OF BIRTH:  1976/06/05  SUBJECTIVE:   No longer with hemoptysis. Patient had a fever this morning. Patient denies cough or dysuria.  REVIEW OF SYSTEMS:    Review of Systems  Constitutional: Positive for fever. Negative for chills and malaise/fatigue.  HENT: Negative for sore throat.   Eyes: Negative for blurred vision.  Respiratory: Negative for cough, hemoptysis, shortness of breath and wheezing.   Cardiovascular: Negative for chest pain, palpitations and leg swelling.  Gastrointestinal: Negative for nausea, vomiting, abdominal pain, diarrhea and blood in stool.  Genitourinary: Negative for dysuria.  Musculoskeletal: Negative for back pain.  Neurological: Negative for dizziness, tremors and headaches.  Endo/Heme/Allergies: Does not bruise/bleed easily.    Tolerating Diet: npo      DRUG ALLERGIES:  No Known Allergies  VITALS:  Blood pressure 129/84, pulse 74, temperature 99.4 F (37.4 C), temperature source Oral, resp. rate 11, height 6' (1.829 m), weight 109.4 kg (241 lb 2.9 oz), SpO2 97 %.  PHYSICAL EXAMINATION:   Physical Exam  Constitutional: He is oriented to person, place, and time and well-developed, well-nourished, and in no distress. No distress.  HENT:  Head: Normocephalic.  Eyes: No scleral icterus.  Neck: Normal range of motion. Neck supple. No JVD present. No tracheal deviation present.  Cardiovascular: Normal rate, regular rhythm and normal heart sounds.  Exam reveals no gallop and no friction rub.   No murmur heard. Pulmonary/Chest: Effort normal and breath sounds normal. No respiratory distress. He has no wheezes. He has no rales. He exhibits no tenderness.  Abdominal: Soft. Bowel sounds are normal. He exhibits no distension and no mass. There is no tenderness. There is no rebound and no guarding.  Musculoskeletal: Normal range of motion.  He exhibits no edema.  Neurological: He is alert and oriented to person, place, and time.  Skin: Skin is warm. No rash noted. No erythema.  Psychiatric: Affect and judgment normal.      LABORATORY PANEL:   CBC  Recent Labs Lab 02/09/16 0446 02/09/16 0822  WBC 4.7  --   HGB 11.7* 11.9*  HCT 37.1*  --   PLT 132*  --    ------------------------------------------------------------------------------------------------------------------  Chemistries   Recent Labs Lab 02/08/16 0749  NA 139  K 4.0  CL 106  CO2 28  GLUCOSE 113*  BUN 11  CREATININE 1.11  CALCIUM 9.1  AST 22  ALT 19  ALKPHOS 69  BILITOT 0.9   ------------------------------------------------------------------------------------------------------------------  Cardiac Enzymes No results for input(s): TROPONINI in the last 168 hours. ------------------------------------------------------------------------------------------------------------------  RADIOLOGY:  Ct Abdomen Pelvis W Contrast  02/08/2016  CLINICAL DATA:  Lower abdominal pain. Blood in stool. Burning abdominal pain. EXAM: CT ABDOMEN AND PELVIS WITH CONTRAST TECHNIQUE: Multidetector CT imaging of the abdomen and pelvis was performed using the standard protocol following bolus administration of intravenous contrast. CONTRAST:  OMNIPAQUE IOHEXOL 300 MG/ML  SOLN COMPARISON:  Chest radiograph of 06/05/2013 FINDINGS: The patient experienced vomiting after the injection. The vomiting appear to include blood. Lower chest: Contrast medium is present in the distal esophagus. No visible extravasation of gas or contrast in the vicinity of the lower thoracic esophagus. Hepatobiliary: 9 mm hypodense lesion of the right hepatic lobe, image 20 series 2. Possible 4 mm lesion in the lateral segment left hepatic lobe, image 19 series 2. Questionable 4 mm lesion inferiorly in the right hepatic lobe, image  32 series 2. These lesions are technically too small to  characterize although statistically likely to be cysts. No prior cross-sectional imaging for correlation. Gallbladder unremarkable, no biliary dilatation. Pancreas: Unremarkable Spleen: Unremarkable Adrenals/Urinary Tract: Adrenal glands normal. Hypodense 7 mm right mid to lower kidney lesion on image 31 series 2, remains hypodense on delayed images, likely a cyst but technically too small to characterize. There is a similar too small to characterize 7 mm lesion of the left mid kidney on image 21 series 2. A 2.2 by 1.8 cm lesion of the left kidney upper pole is fluid density on portal venous and delayed phase images, compatible with benign cyst. Stomach/Bowel: Wall thickening along the anterior gastric body, image 10 series 9 and image 25 series 2. However, this does not appear to persist on the delayed images. Appendix normal. Scattered air-fluid levels in the descending and sigmoid colon raise the possibility of diarrheal process. Orally administered contrast extends to the distal small bowel but not quite to the terminal most ileum. Vascular/Lymphatic: Unremarkable Reproductive: Mildly retracted right testicle along the spermatic cord. Other: No supplemental non-categorized findings. Musculoskeletal: Disc osteophyte complex at L5-S1 eccentric to the left with borderline left foraminal stenosis. Suspected right foraminal stenosis at T12-L1 due to facet arthropathy. IMPRESSION: 1. Somewhat indistinct anterior wall thickening in the stomach body. This appears different from the posterior gastric wall, but on delayed phase images is much less apparent, raising the possibility of peristalsis as a cause. Given the presence of vomiting apparently containing blood after the contrast injection, suspicion for possible upper GI bleed is raised and assessment of the esophagus and stomach should be considered. There is evidence of gastroesophageal reflux during the course of today's exam. 2. Air-fluid levels in the distal  colon suspicious for diarrheal process. 3. Lumbar spondylosis and degenerative disc disease with suspected right foraminal stenosis at T12-L1 due to facet arthropathy. 4. Other imaging findings of potential clinical significance: Nonspecific hypodense hepatic and renal lesions are statistically likely to be small cysts. Mildly retracted right testicle along the spermatic cord. Electronically Signed   By: Gaylyn Rong M.D.   On: 02/08/2016 13:52   Dg Abd Acute W/chest  02/08/2016  CLINICAL DATA:  Acute left upper quadrant abdominal pain.  Pre EGD. EXAM: DG ABDOMEN ACUTE W/ 1V CHEST COMPARISON:  Chest radiograph 06/05/2013. CT abdomen/pelvis earlier this day at 1318 hour. FINDINGS: The cardiomediastinal contours are normal. The lungs are clear. There is no free intra-abdominal air. No dilated bowel loops to suggest obstruction. Enteric contrast throughout the colon from prior CT. Excreted intravenous contrast within the urinary bladder and renal collecting systems. No acute osseous abnormalities are seen. IMPRESSION: 1. Clear lungs. 2. Normal bowel gas pattern. Enteric contrast throughout the colon from prior CT. No free air. Electronically Signed   By: Rubye Oaks M.D.   On: 02/08/2016 18:18     ASSESSMENT AND PLAN:   40 year old male with history of peptic ulcer disease and tobacco abuse who presented to the emergency room with acute abdominal pain and hematochezia and hematemesis.   1. Upper GI bleed: Continue octreotide and Protonix. Plan for EGD this afternoon. Hemoglobin remained stable. Appreciate GI consultation.  2. Fever: Unclear etiology. Chest x-ray shows no acute infiltrate. Urinalysis shows no evidence of urinary tract infection. Blood cultures are pending and I will follow-up on these results. Influenza A and B are negative Check GI panel as well as patient was having diarrhea.  3. Tobacco abuse: Patient is counseled by admitting  physician.  Management plans discussed  with the patient and he is in agreement.  CODE STATUS: FULL  TOTAL TIME TAKING CARE OF THIS PATIENT: 30 minutes.     POSSIBLE D/C 1-2 days, DEPENDING ON CLINICAL CONDITION.   Xitlally Mooneyham M.D on 02/09/2016 at 12:12 PM  Between 7am to 6pm - Pager - [phone] After 6pm go to [email or web address] - password [email or web address] [facility]  [place] [facility]  Office  [phone]  CC: Primary care physician; [facility]  Note: This dictation was prepared with Dragon dictation along with smaller phrase technology. Any transcriptional errors that result from this process are unintentional.

## 2016-02-10 LAB — H PYLORI, IGM, IGG, IGA AB: H Pylori IgG: 3.5 U/mL — ABNORMAL HIGH (ref 0.0–0.8)

## 2016-02-10 MED ORDER — HYDROCORTISONE 2.5 % RE CREA: TOPICAL_CREAM | Freq: Four times a day (QID) | RECTAL | Status: DC

## 2016-02-10 MED ORDER — PANTOPRAZOLE SODIUM 40 MG PO TBEC
40.0000 mg | DELAYED_RELEASE_TABLET | Freq: Two times a day (BID) | ORAL | Status: DC
  Administered 2016-02-10: 40 mg via ORAL
  Filled 2016-02-10: qty 1

## 2016-02-10 MED ORDER — PANTOPRAZOLE SODIUM 40 MG PO TBEC: 40.0000 mg | DELAYED_RELEASE_TABLET | Freq: Two times a day (BID) | ORAL | Status: DC

## 2016-02-10 NOTE — Progress Notes (Signed)
Initial Nutrition Assessment   INTERVENTION:   Meals and Snacks: Cater to patient preferences Medical Food Supplement Therapy: will recommend sending Carnation Instant Breakfast TID for pt to try for added nutrition    NUTRITION DIAGNOSIS:   Inadequate oral intake related to altered GI function as evidenced by per patient/family report.  GOAL:   Patient will meet greater than or equal to 90% of their needs  MONITOR:    (Energy Intake, Electrolyte and renal Profile, digestive system, Anthropometrics)  REASON FOR ASSESSMENT:   Malnutrition Screening Tool    ASSESSMENT:   Pt admitted with hematemesis and abdominal pain. Pt s/p EGD 2/22 c/w esophagitis, gastritis, non-bleeding gastric ulcer, HH, and duodenitis per MD note. Per Nsg discharge orders placed if tolerating soft diet order today.  Past Medical History  Diagnosis Date  . Tobacco abuse     Diet Order:  DIET SOFT Room service appropriate?: Yes; Fluid consistency:: Thin    Current Nutrition: Pt reports tolerating some ice cream this am on [place] diet order. Asking for baked chicken.   Food/Nutrition-Related History: Pt reports eating one meal a day for the past 4 months. 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 For the past 4 months, secondary to depression, he reports not eating any more the rest of the day. Pt reports additionally secondary to GI symptoms having not had anything since Monday night at 6pm.    Scheduled Medications:  . folic acid  1 mg Oral Daily  . hydrocortisone   Rectal QID  . multivitamin with minerals  1 tablet Oral Daily  . octreotide  50 mcg Intravenous Once  . pantoprazole  40 mg Oral BID  . sodium chloride flush  3 mL Intravenous Q12H  . sodium chloride flush  3 mL Intravenous Q12H  . thiamine  100 mg Oral Daily   Or  . thiamine  100 mg Intravenous Daily    Continuous Medications:  . sodium chloride 125 mL/hr at 02/10/16 0507  . octreotide   (SANDOSTATIN)    IV infusion 25 mcg/hr (02/09/16 1800)     Electrolyte/Renal Profile and Glucose Profile:   Recent Labs Lab 02/08/16 0749  NA 139  K 4.0  CL 106  CO2 28  BUN 11  CREATININE 1.11  CALCIUM 9.1  GLUCOSE 113*   Protein Profile:   Recent Labs Lab 02/08/16 0749  ALBUMIN 4.4    Gastrointestinal Profile: Last BM: 02/08/2016   Nutrition-Focused Physical Exam Findings: Nutrition-Focused physical exam completed. Findings are WDL for fat depletion, muscle depletion, and edema.    Weight Change: Pt reports weight of 300lbs 5 months ago and now is 240lbs. Per [facility] weight encounters, weight gain in the past 7 months.   Height:   Ht Readings from Last 1 Encounters:  02/08/16 6' (1.829 m)    Weight:   Wt Readings from Last 1 Encounters:  02/10/16 246 lb (111.585 kg)   Wt Readings from Last 10 Encounters:  02/10/16 246 lb (111.585 kg)  11/17/15 240 lb (108.863 kg)  07/29/15 215 lb (97.523 kg)    BMI:  Body mass index is 33.36 kg/(m^2).   EDUCATION NEEDS:   No education needs identified at this time  LOW Care Level  Leda Quail, RD, LDN Pager [phone] Weekend/On-Call Pager [phone]

## 2016-02-10 NOTE — Consult Note (Signed)
Subjective: Patient seen for GI bleeding/abdominal pain and hematemesis. No recurrence about 48 hours.  Patietn reports a bm this afternoon non-bloody.  Denies nausea and vomiting.     Objective: Vital signs in last 24 hours: Temp:  [97.5 F (36.4 C)-98.3 F (36.8 C)] 97.7 F (36.5 C) (02/23 1330) Pulse Rate:  [61-71] 66 (02/23 1330) Resp:  [16-19] 16 (02/23 1330) BP: (120-159)/(70-105) 120/70 mmHg (02/23 1330) SpO2:  [97 %-99 %] 99 % (02/23 1330) Weight:  [111.585 kg (246 lb)] 111.585 kg (246 lb) (02/23 0207) Blood pressure 120/70, pulse 66, temperature 97.7 F (36.5 C), temperature source Oral, resp. rate 16, height 6' (1.829 m), weight 111.585 kg (246 lb), SpO2 99 %.   Intake/Output from previous day: 02/22 0701 - 02/23 0700 In: 5189 [P.O.:1900; I.V.:3289] Out: 2700 [Urine:2700]  Intake/Output this shift: Total I/O In: 305.5 [I.V.:305.5] Out: 0    General appearance:  Well appearing no distress Resp:  bcta Cardio:  rrr GI:  Soft nontender, nondistended, bowel sounds positive and normoactive.  Extremities:  No cce   Lab Results: No results found for this or any previous visit (from the past 24 hour(s)).    Recent Labs  02/08/16 0749 02/08/16 1518 02/08/16 1956 02/09/16 0446 02/09/16 0822  WBC 6.5 7.1  --  4.7  --   HGB 13.5 12.8* 12.2* 11.7* 11.9*  HCT 43.1 41.2  --  37.1*  --   PLT 176 142*  --  132*  --    BMET  Recent Labs  02/08/16 0749  NA 139  K 4.0  CL 106  CO2 28  GLUCOSE 113*  BUN 11  CREATININE 1.11  CALCIUM 9.1   LFT  Recent Labs  02/08/16 0749  PROT 7.5  ALBUMIN 4.4  AST 22  ALT 19  ALKPHOS 69  BILITOT 0.9   PT/INR  Recent Labs  02/08/16 0749  LABPROT 14.3  INR 1.09   Hepatitis Panel No results for input(s): HEPBSAG, HCVAB, HEPAIGM, HEPBIGM in the last 72 hours. C-Diff No results for input(s): CDIFFTOX in the last 72 hours. No results for input(s): CDIFFPCR in the last 72 hours.   Studies/Results: Dg Abd Acute  W/chest  02/08/2016  CLINICAL DATA:  Acute left upper quadrant abdominal pain.  Pre EGD. EXAM: DG ABDOMEN ACUTE W/ 1V CHEST COMPARISON:  Chest radiograph 06/05/2013. CT abdomen/pelvis earlier this day at 1318 hour. FINDINGS: The cardiomediastinal contours are normal. The lungs are clear. There is no free intra-abdominal air. No dilated bowel loops to suggest obstruction. Enteric contrast throughout the colon from prior CT. Excreted intravenous contrast within the urinary bladder and renal collecting systems. No acute osseous abnormalities are seen. IMPRESSION: 1. Clear lungs. 2. Normal bowel gas pattern. Enteric contrast throughout the colon from prior CT. No free air. Electronically Signed   By: Rubye Oaks M.D.   On: 02/08/2016 18:18    Scheduled Inpatient Medications:   . folic acid  1 mg Oral Daily  . hydrocortisone   Rectal QID  . multivitamin with minerals  1 tablet Oral Daily  . octreotide  50 mcg Intravenous Once  . pantoprazole  40 mg Oral BID  . sodium chloride flush  3 mL Intravenous Q12H  . sodium chloride flush  3 mL Intravenous Q12H  . thiamine  100 mg Oral Daily   Or  . thiamine  100 mg Intravenous Daily    Continuous Inpatient Infusions:   . sodium chloride 125 mL/hr at 02/10/16 0507  . octreotide  (  SANDOSTATIN)    IV infusion 25 mcg/hr (02/09/16 1800)    PRN Inpatient Medications:  sodium chloride, HYDROmorphone (DILAUDID) injection, LORazepam **OR** LORazepam, ondansetron **OR** ondansetron (ZOFRAN) IV, sodium chloride flush  Miscellaneous:   Assessment:  1) rectal bleeding-patietn with multiple external and internal hemorrhoids on dre.  Currently on hydrocortisone cream.  Continue qid for 10 days.   2) hematemesis-grade C erosive esophagitis and gastric ulcer.  Continue bid ppi, no asa or nsaids, alcohol abstinence, follow up GI in 10-14 days.  Will need repeat egd and possibly flex sig for further evaluation of hemorrhjoids/followup.   Plan:  As above.   Discussed with Dr Juliene Pina.   Christena Deem MD 02/10/2016, 2:34 PM

## 2016-02-10 NOTE — Discharge Summary (Signed)
[facility] - [place] at [facility]   PATIENT NAME: Roger Pennington    MR#:  [id number]  DATE OF BIRTH:  June 07, 1976  DATE OF ADMISSION:  02/08/2016 ADMITTING PHYSICIAN: Christena Deem, MD  DATE OF DISCHARGE: 02/10/2016  PRIMARY CARE PHYSICIAN: [facility]    ADMISSION DIAGNOSIS:  Acute upper GI bleed [K92.2] Acute abdominal pain in right upper quadrant [R10.10] Abdominal pain, acute, left upper quadrant [R10.12]  DISCHARGE DIAGNOSIS:  Active Problems:   Upper GI bleed   SECONDARY DIAGNOSIS:   Past Medical History  Diagnosis Date  . Tobacco abuse     HOSPITAL COURSE:   40 year old male with history of peptic ulcer disease and tobacco abuse who presented to the emergency room with acute abdominal pain and hematochezia and hematemesis.   1. Upper GI bleed: Patient was started on Protonix and Octreotide.  He underwent EGD on 2/22 which shows LA grade C erosive esophagitis and erosive gastritis with a nonbleeding gastric ulcer with no stigmata of bleeding. It also shows duodenitis. Patient will continue with Protonix 40 minutes by mouth twice a day. He will follow up with GI in 4 weeks.    2. Fever: Patient had a fever the day after admission. Patient has been afebrile since then and not started on antibiotics. Unclear etiology of the fever.  Chest x-ray shows no acute infiltrate. Urinalysis shows no evidence of urinary tract infection. Blood cultures are negative. Influenza A and B are negative Check GI panel as well as patient was having diarrhea.  3. Tobacco abuse: Patient was counseled by admitting physician  4. Hemorrhoids: Patient will be discharged with Anusol.  DISCHARGE CONDITIONS AND DIET:   Patient is discharge in stable condition on a regular diet  CONSULTS OBTAINED:     DRUG ALLERGIES:  No Known Allergies  DISCHARGE MEDICATIONS:   Current Discharge Medication List    START taking these medications   Details   hydrocortisone (ANUSOL-HC) 2.5 % rectal cream Place rectally 4 (four) times daily. Qty: 30 g, Refills: 0    pantoprazole (PROTONIX) 40 MG tablet Take 1 tablet (40 mg total) by mouth 2 (two) times daily. Qty: 60 tablet, Refills: 0      CONTINUE these medications which have NOT CHANGED   Details  HYDROcodone-acetaminophen (NORCO) 5-325 MG tablet Take 1-2 tablets by mouth every 4 (four) hours as needed for moderate pain. Qty: 10 tablet, Refills: 0      STOP taking these medications     cyclobenzaprine (FLEXERIL) 10 MG tablet      naproxen (EC NAPROSYN) 500 MG EC tablet               Today   CHIEF COMPLAINT:  Patient denies abdominal pain, melena or hematochezia.   VITAL SIGNS:  Blood pressure 129/82, pulse 66, temperature 97.5 F (36.4 C), temperature source Oral, resp. rate 19, height 6' (1.829 m), weight 111.585 kg (246 lb), SpO2 99 %.   REVIEW OF SYSTEMS:  Review of Systems  Constitutional: Negative for fever, chills and malaise/fatigue.  HENT: Negative for sore throat.   Eyes: Negative for blurred vision.  Respiratory: Negative for cough, hemoptysis, shortness of breath and wheezing.   Cardiovascular: Negative for chest pain, palpitations and leg swelling.  Gastrointestinal: Negative for nausea, vomiting, abdominal pain, diarrhea and blood in stool.  Genitourinary: Negative for dysuria.  Musculoskeletal: Negative for back pain.  Neurological: Negative for dizziness, tremors and headaches.  Endo/Heme/Allergies: Does not bruise/bleed easily.  PHYSICAL EXAMINATION:  GENERAL:  40 y.o.-year-old patient lying in the bed with no acute distress.  NECK:  Supple, no jugular venous distention. No thyroid enlargement, no tenderness.  LUNGS: Normal breath sounds bilaterally, no wheezing, rales,rhonchi  No use of accessory muscles of respiration.  CARDIOVASCULAR: S1, S2 normal. No murmurs, rubs, or gallops.  ABDOMEN: Soft, non-tender, non-distended. Bowel sounds  present. No organomegaly or mass.  EXTREMITIES: No pedal edema, cyanosis, or clubbing.  PSYCHIATRIC: The patient is alert and oriented x 3.  SKIN: No obvious rash, lesion, or ulcer.   DATA REVIEW:   CBC  Recent Labs Lab 02/09/16 0446 02/09/16 0822  WBC 4.7  --   HGB 11.7* 11.9*  HCT 37.1*  --   PLT 132*  --     Chemistries   Recent Labs Lab 02/08/16 0749  NA 139  K 4.0  CL 106  CO2 28  GLUCOSE 113*  BUN 11  CREATININE 1.11  CALCIUM 9.1  AST 22  ALT 19  ALKPHOS 69  BILITOT 0.9    Cardiac Enzymes No results for input(s): TROPONINI in the last 168 hours.  Microbiology Results  @  RADIOLOGY:  Ct Abdomen Pelvis W Contrast  02/08/2016  CLINICAL DATA:  Lower abdominal pain. Blood in stool. Burning abdominal pain. EXAM: CT ABDOMEN AND PELVIS WITH CONTRAST TECHNIQUE: Multidetector CT imaging of the abdomen and pelvis was performed using the standard protocol following bolus administration of intravenous contrast. CONTRAST:  OMNIPAQUE IOHEXOL 300 MG/ML  SOLN COMPARISON:  Chest radiograph of 06/05/2013 FINDINGS: The patient experienced vomiting after the injection. The vomiting appear to include blood. Lower chest: Contrast medium is present in the distal esophagus. No visible extravasation of gas or contrast in the vicinity of the lower thoracic esophagus. Hepatobiliary: 9 mm hypodense lesion of the right hepatic lobe, image 20 series 2. Possible 4 mm lesion in the lateral segment left hepatic lobe, image 19 series 2. Questionable 4 mm lesion inferiorly in the right hepatic lobe, image 32 series 2. These lesions are technically too small to characterize although statistically likely to be cysts. No prior cross-sectional imaging for correlation. Gallbladder unremarkable, no biliary dilatation. Pancreas: Unremarkable Spleen: Unremarkable Adrenals/Urinary Tract: Adrenal glands normal. Hypodense 7 mm right mid to lower kidney lesion on image 31 series 2, remains  hypodense on delayed images, likely a cyst but technically too small to characterize. There is a similar too small to characterize 7 mm lesion of the left mid kidney on image 21 series 2. A 2.2 by 1.8 cm lesion of the left kidney upper pole is fluid density on portal venous and delayed phase images, compatible with benign cyst. Stomach/Bowel: Wall thickening along the anterior gastric body, image 10 series 9 and image 25 series 2. However, this does not appear to persist on the delayed images. Appendix normal. Scattered air-fluid levels in the descending and sigmoid colon raise the possibility of diarrheal process. Orally administered contrast extends to the distal small bowel but not quite to the terminal most ileum. Vascular/Lymphatic: Unremarkable Reproductive: Mildly retracted right testicle along the spermatic cord. Other: No supplemental non-categorized findings. Musculoskeletal: Disc osteophyte complex at L5-S1 eccentric to the left with borderline left foraminal stenosis. Suspected right foraminal stenosis at T12-L1 due to facet arthropathy. IMPRESSION: 1. Somewhat indistinct anterior wall thickening in the stomach body. This appears different from the posterior gastric wall, but on delayed phase images is much less apparent, raising the possibility of peristalsis as a cause. Given the presence of  vomiting apparently containing blood after the contrast injection, suspicion for possible upper GI bleed is raised and assessment of the esophagus and stomach should be considered. There is evidence of gastroesophageal reflux during the course of today's exam. 2. Air-fluid levels in the distal colon suspicious for diarrheal process. 3. Lumbar spondylosis and degenerative disc disease with suspected right foraminal stenosis at T12-L1 due to facet arthropathy. 4. Other imaging findings of potential clinical significance: Nonspecific hypodense hepatic and renal lesions are statistically likely to be small cysts.  Mildly retracted right testicle along the spermatic cord. Electronically Signed   By: Gaylyn Rong M.D.   On: 02/08/2016 13:52   Dg Abd Acute W/chest  02/08/2016  CLINICAL DATA:  Acute left upper quadrant abdominal pain.  Pre EGD. EXAM: DG ABDOMEN ACUTE W/ 1V CHEST COMPARISON:  Chest radiograph 06/05/2013. CT abdomen/pelvis earlier this day at 1318 hour. FINDINGS: The cardiomediastinal contours are normal. The lungs are clear. There is no free intra-abdominal air. No dilated bowel loops to suggest obstruction. Enteric contrast throughout the colon from prior CT. Excreted intravenous contrast within the urinary bladder and renal collecting systems. No acute osseous abnormalities are seen. IMPRESSION: 1. Clear lungs. 2. Normal bowel gas pattern. Enteric contrast throughout the colon from prior CT. No free air. Electronically Signed   By: Rubye Oaks M.D.   On: 02/08/2016 18:18      Management plans discussed with the patient and he is in agreement. Stable for discharge home  Patient should follow up with GI 4 weeks  CODE STATUS:     Code Status Orders        Start     Ordered   02/08/16 1405  Full code   Continuous     02/08/16 1406    Code Status History    Date Active Date Inactive Code Status Order ID Comments User Context   This patient has a current code status but no historical code status.      TOTAL TIME TAKING CARE OF THIS PATIENT: 35 minutes.    Note: This dictation was prepared with Dragon dictation along with smaller phrase technology. Any transcriptional errors that result from this process are unintentional.  Saburo Luger M.D on 02/10/2016 at 10:26 AM  Between 7am to 6pm - Pager - [phone] After 6pm go to [email or web address] - password [email or web address] [facility]  [place]  Office  [phone]  CC: Primary care physician; [facility]

## 2016-02-13 LAB — CULTURE, BLOOD (ROUTINE X 2)
CULTURE: NO GROWTH
Culture: NO GROWTH

## 2016-09-11 ENCOUNTER — Encounter: Payer: Self-pay | Admitting: Emergency Medicine

## 2016-09-11 ENCOUNTER — Emergency Department: Payer: Self-pay

## 2016-09-11 ENCOUNTER — Emergency Department
Admission: EM | Admit: 2016-09-11 | Discharge: 2016-09-11 | Disposition: A | Discharge status: Home / Self Care | Payer: Self-pay | Attending: Emergency Medicine | Admitting: Emergency Medicine

## 2016-09-11 DIAGNOSIS — F17200 Nicotine dependence, unspecified, uncomplicated: Secondary | ICD-10-CM | POA: Insufficient documentation

## 2016-09-11 DIAGNOSIS — M25522 Pain in left elbow: Secondary | ICD-10-CM | POA: Insufficient documentation

## 2016-09-11 DIAGNOSIS — M25512 Pain in left shoulder: Secondary | ICD-10-CM | POA: Insufficient documentation

## 2016-09-11 LAB — CBC WITH DIFFERENTIAL/PLATELET
Basophils Absolute: 0 10*3/uL (ref 0–0.1)
Basophils Relative: 1 %
EOS ABS: 0.3 10*3/uL (ref 0–0.7)
Eosinophils Relative: 5 %
HEMATOCRIT: 40.7 % (ref 40.0–52.0)
HEMOGLOBIN: 12.9 g/dL — AB (ref 13.0–18.0)
LYMPHS ABS: 2.8 10*3/uL (ref 1.0–3.6)
Lymphocytes Relative: 45 %
MCH: 25.5 pg — AB (ref 26.0–34.0)
MCHC: 31.8 g/dL — AB (ref 32.0–36.0)
MCV: 80.2 fL (ref 80.0–100.0)
MONOS PCT: 6 %
Monocytes Absolute: 0.4 10*3/uL (ref 0.2–1.0)
NEUTROS PCT: 43 %
Neutro Abs: 2.7 10*3/uL (ref 1.4–6.5)
Platelets: 187 10*3/uL (ref 150–440)
RBC: 5.07 MIL/uL (ref 4.40–5.90)
RDW: 13.2 % (ref 11.5–14.5)
WBC: 6.3 10*3/uL (ref 3.8–10.6)

## 2016-09-11 LAB — COMPREHENSIVE METABOLIC PANEL
ALK PHOS: 59 U/L (ref 38–126)
ALT: 23 U/L (ref 17–63)
ANION GAP: 5 (ref 5–15)
AST: 20 U/L (ref 15–41)
Albumin: 3.9 g/dL (ref 3.5–5.0)
BILIRUBIN TOTAL: 0.6 mg/dL (ref 0.3–1.2)
BUN: 13 mg/dL (ref 6–20)
CALCIUM: 8.9 mg/dL (ref 8.9–10.3)
CO2: 26 mmol/L (ref 22–32)
Chloride: 106 mmol/L (ref 101–111)
Creatinine, Ser: 1.19 mg/dL (ref 0.61–1.24)
Glucose, Bld: 100 mg/dL — ABNORMAL HIGH (ref 65–99)
Potassium: 3.9 mmol/L (ref 3.5–5.1)
SODIUM: 137 mmol/L (ref 135–145)
TOTAL PROTEIN: 7 g/dL (ref 6.5–8.1)

## 2016-09-11 IMAGING — CR DG ELBOW COMPLETE 3+V*L*
1 series · 4 of 4 positions shown · non-contrast
Comparison: None.

CLINICAL DATA: Pain for 1 day

EXAM:
LEFT ELBOW - COMPLETE 3+ VIEW

[Series 1: dg elbow complete left (3+view) · 0.14mm/px · 4 of 4 slices shown]
[im 1/4]
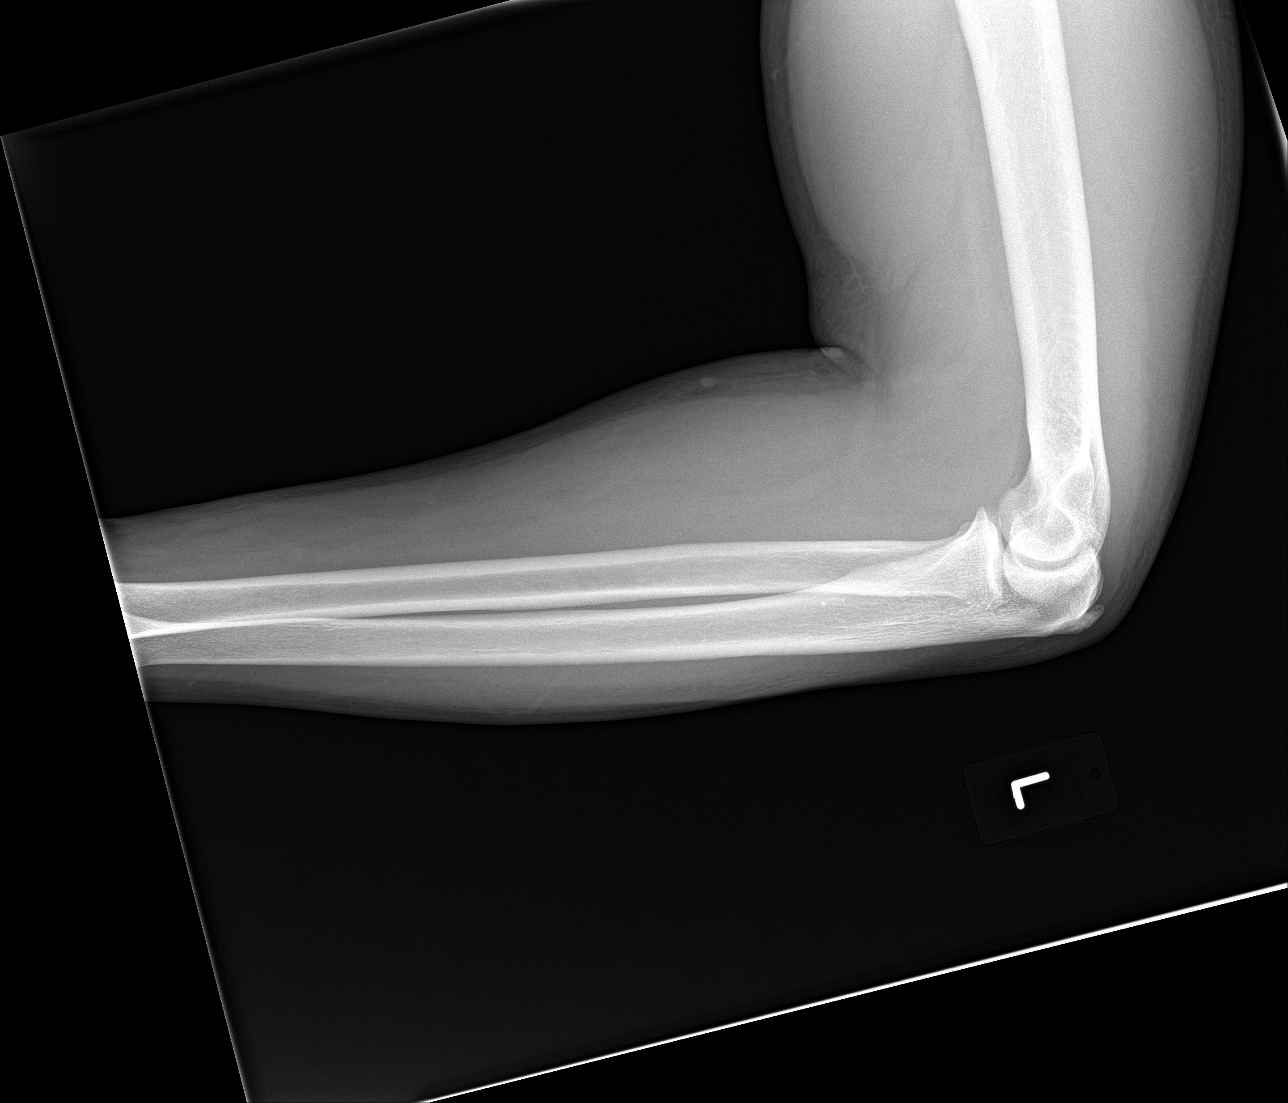
[im 2/4]
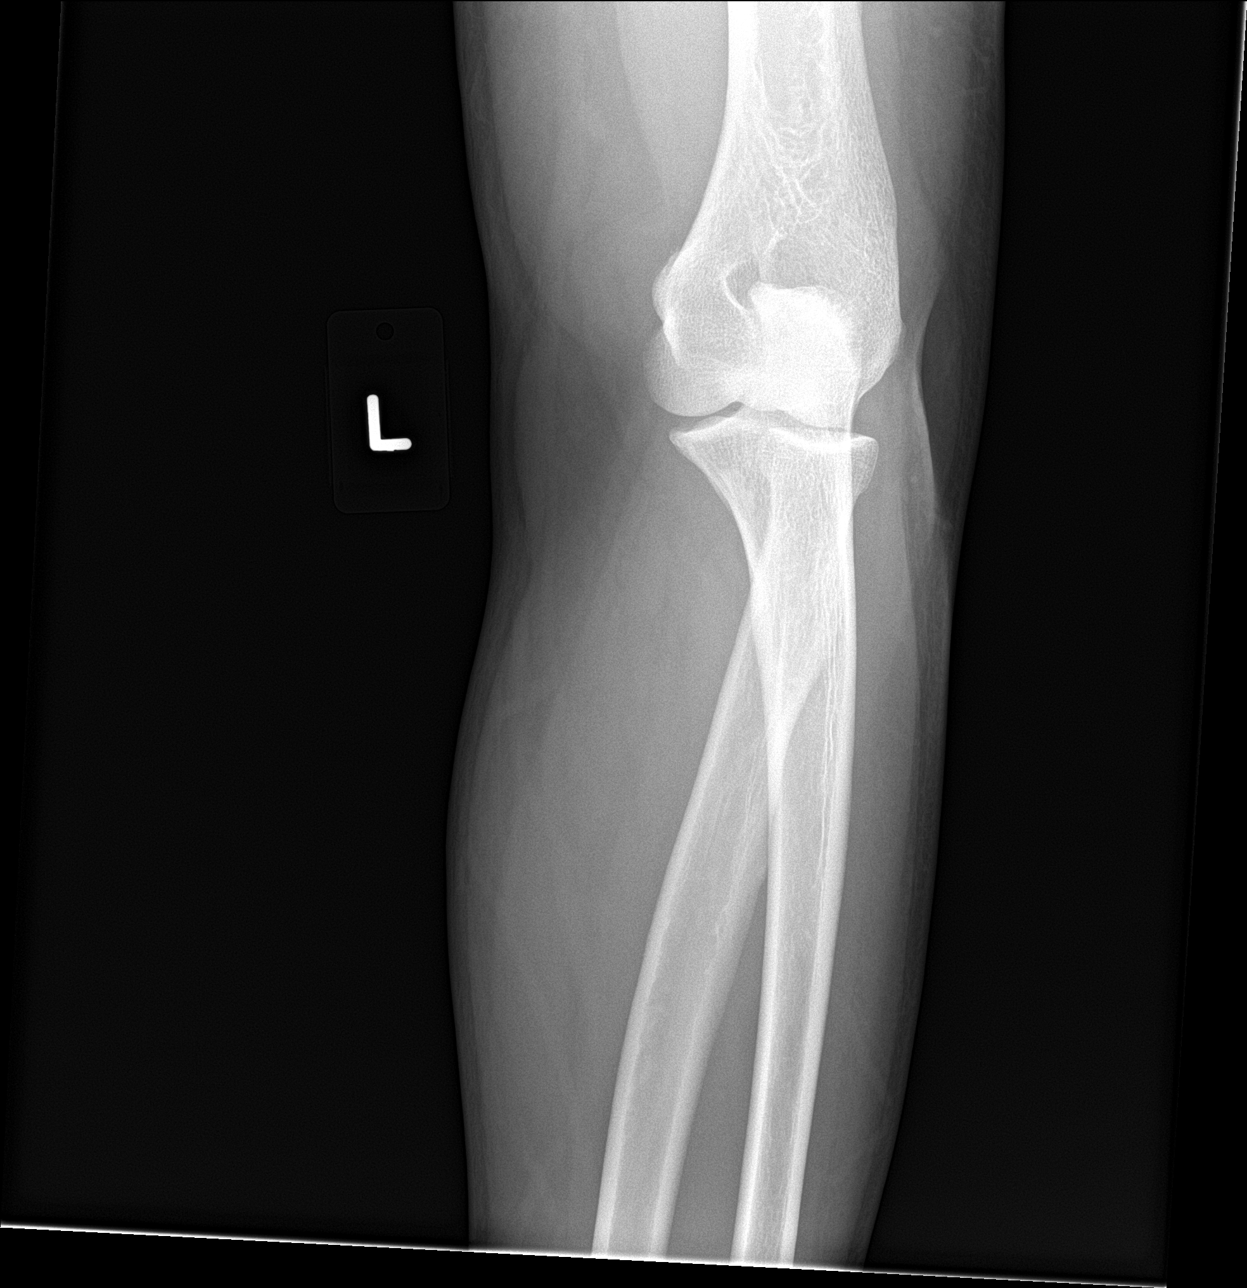
[im 3/4]
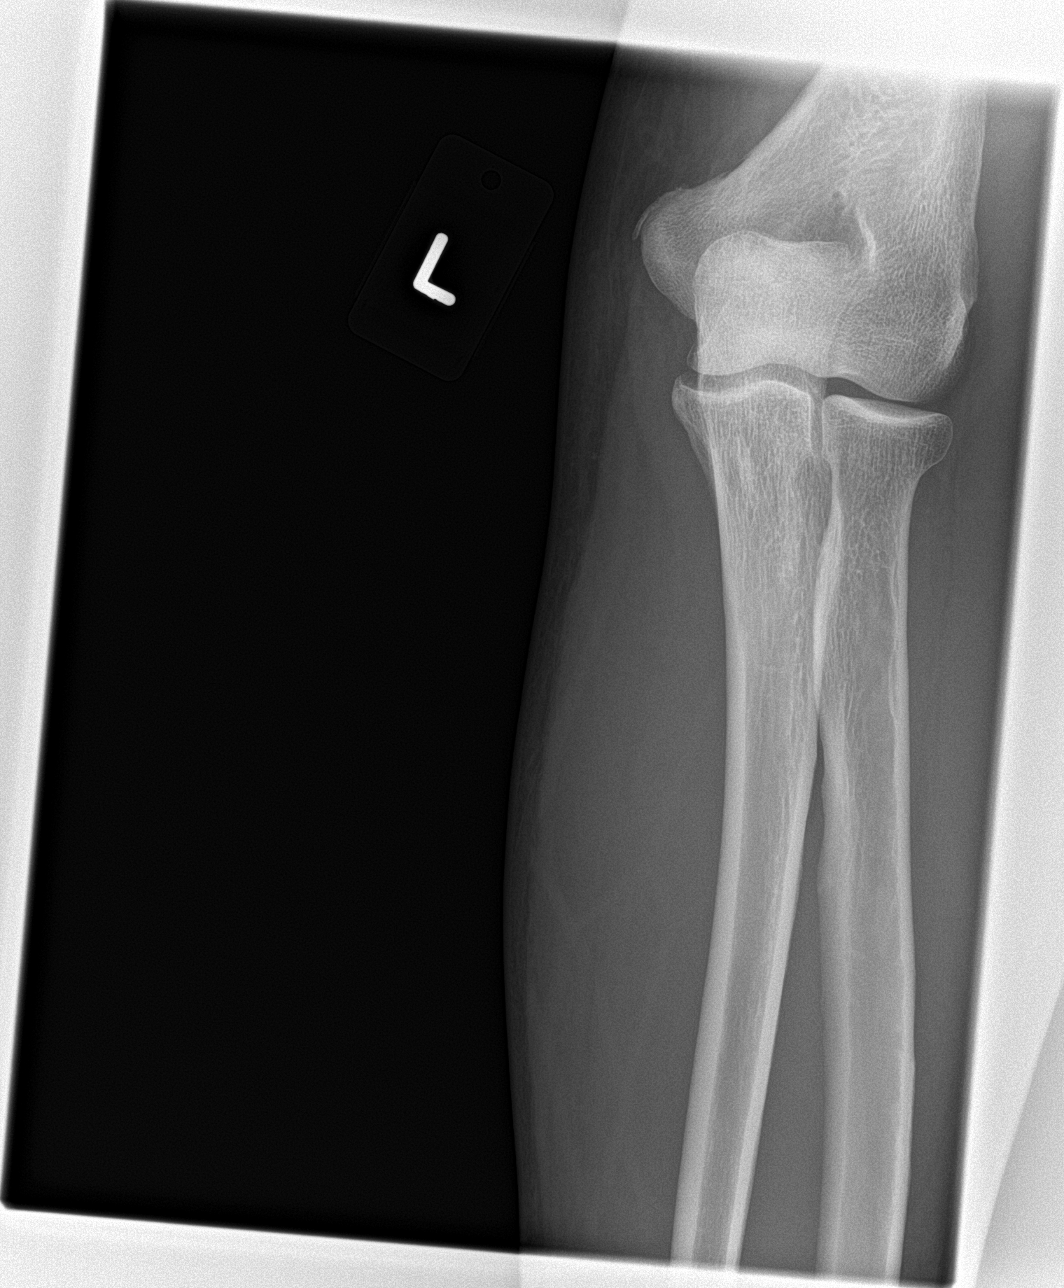
[im 4/4]
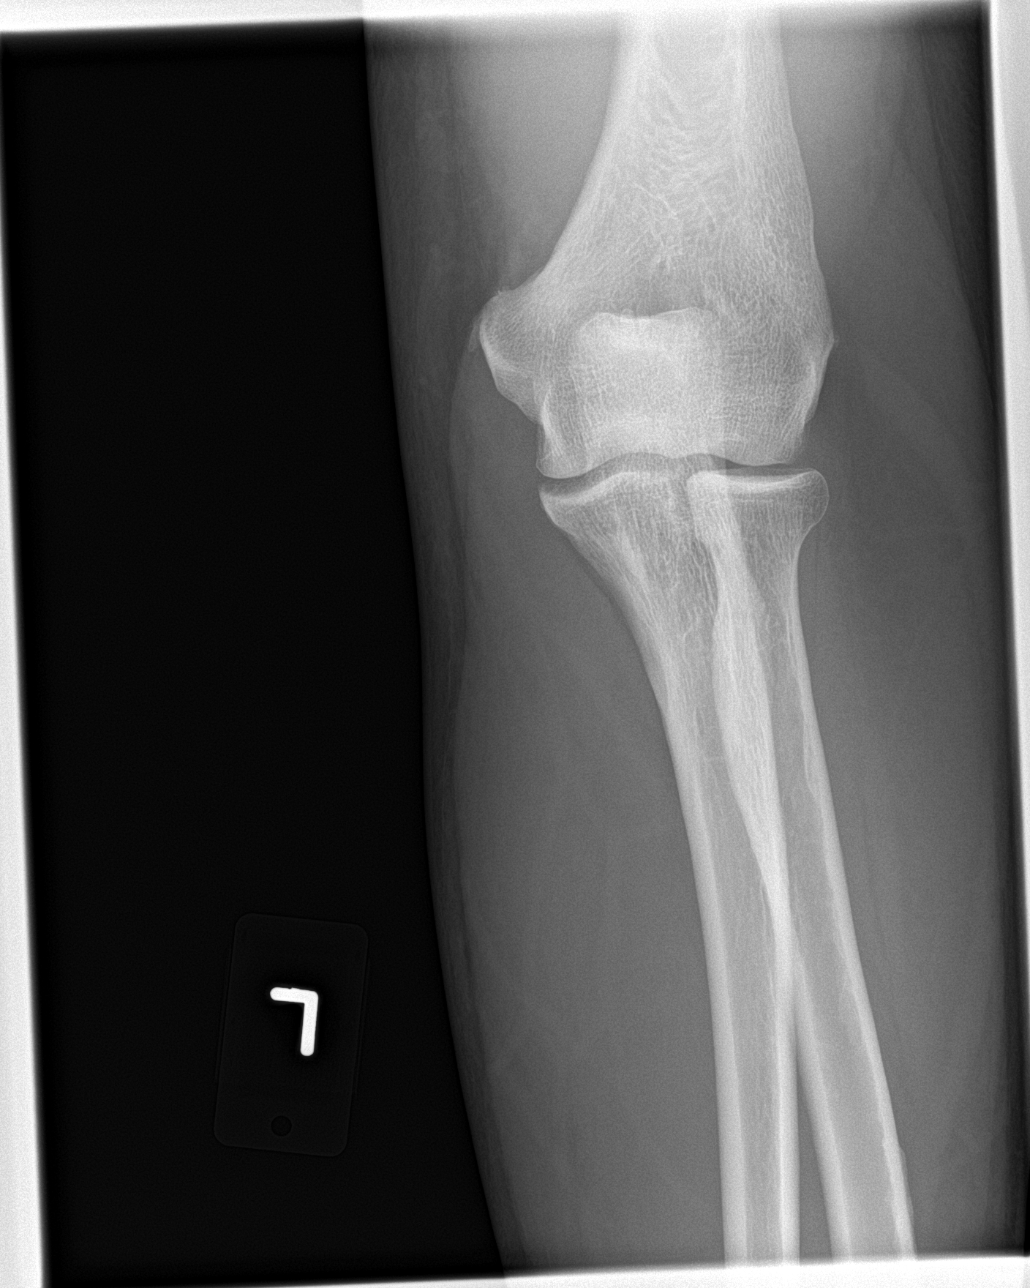

[4 of 4 positions shown; findings below may reference images not displayed]

FINDINGS: No acute fracture. No dislocation.  Unremarkable soft tissues.
IMPRESSION: No acute bony pathology.

## 2016-09-11 IMAGING — CR DG SHOULDER 2+V*L*
1 series · 3 of 3 positions shown · non-contrast
Comparison: None.

CLINICAL DATA: Severe left shoulder pain for 1 day. No reported
injury.

EXAM:
LEFT SHOULDER - 2+ VIEW

[Series 1: dg shoulder left · 0.14mm/px · 3 of 3 slices shown]
[im 1/3]
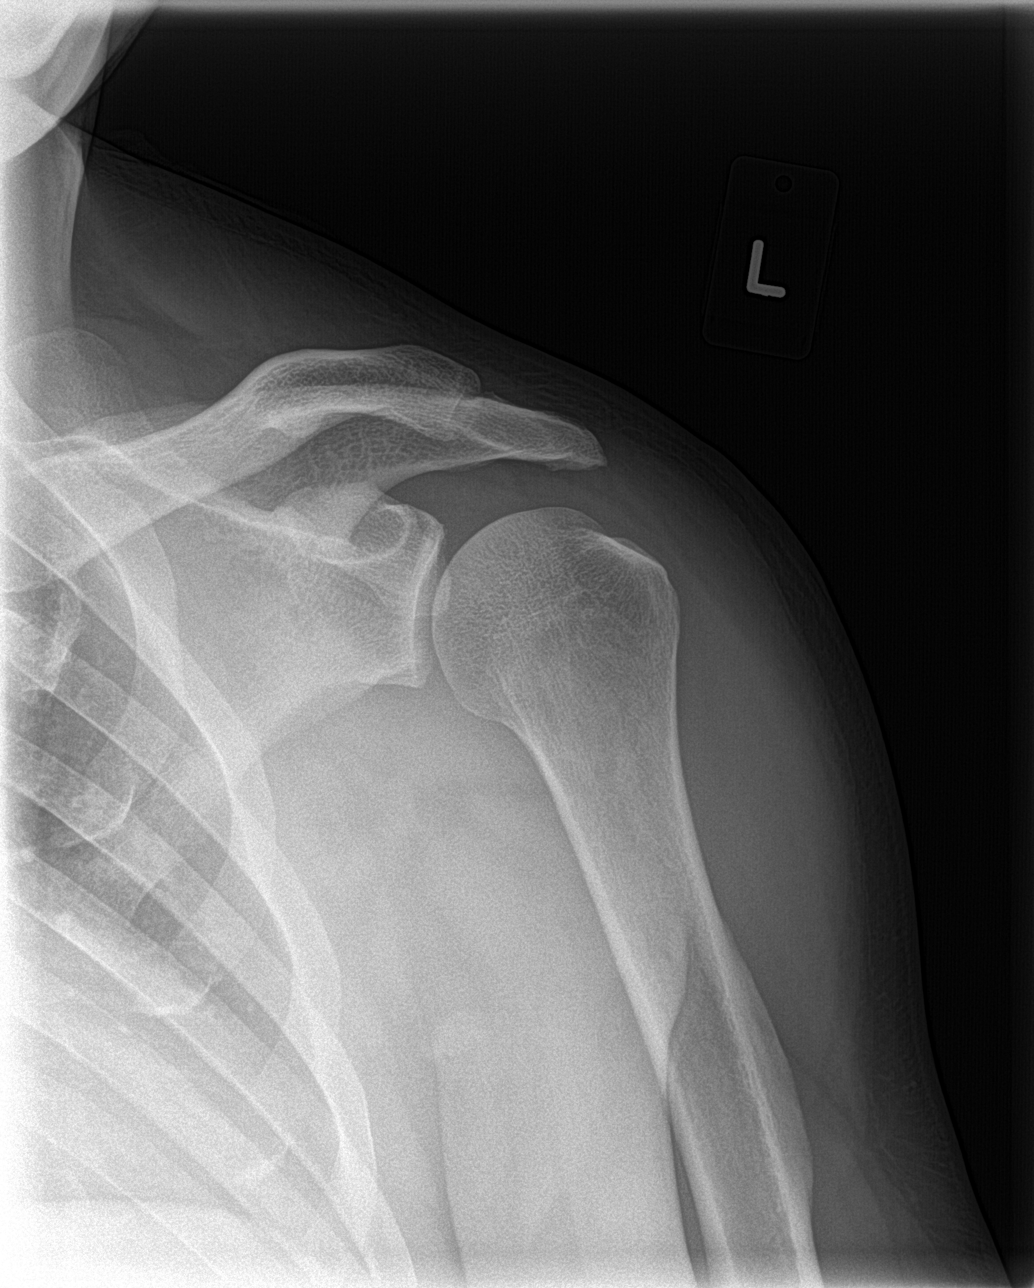
[im 2/3]
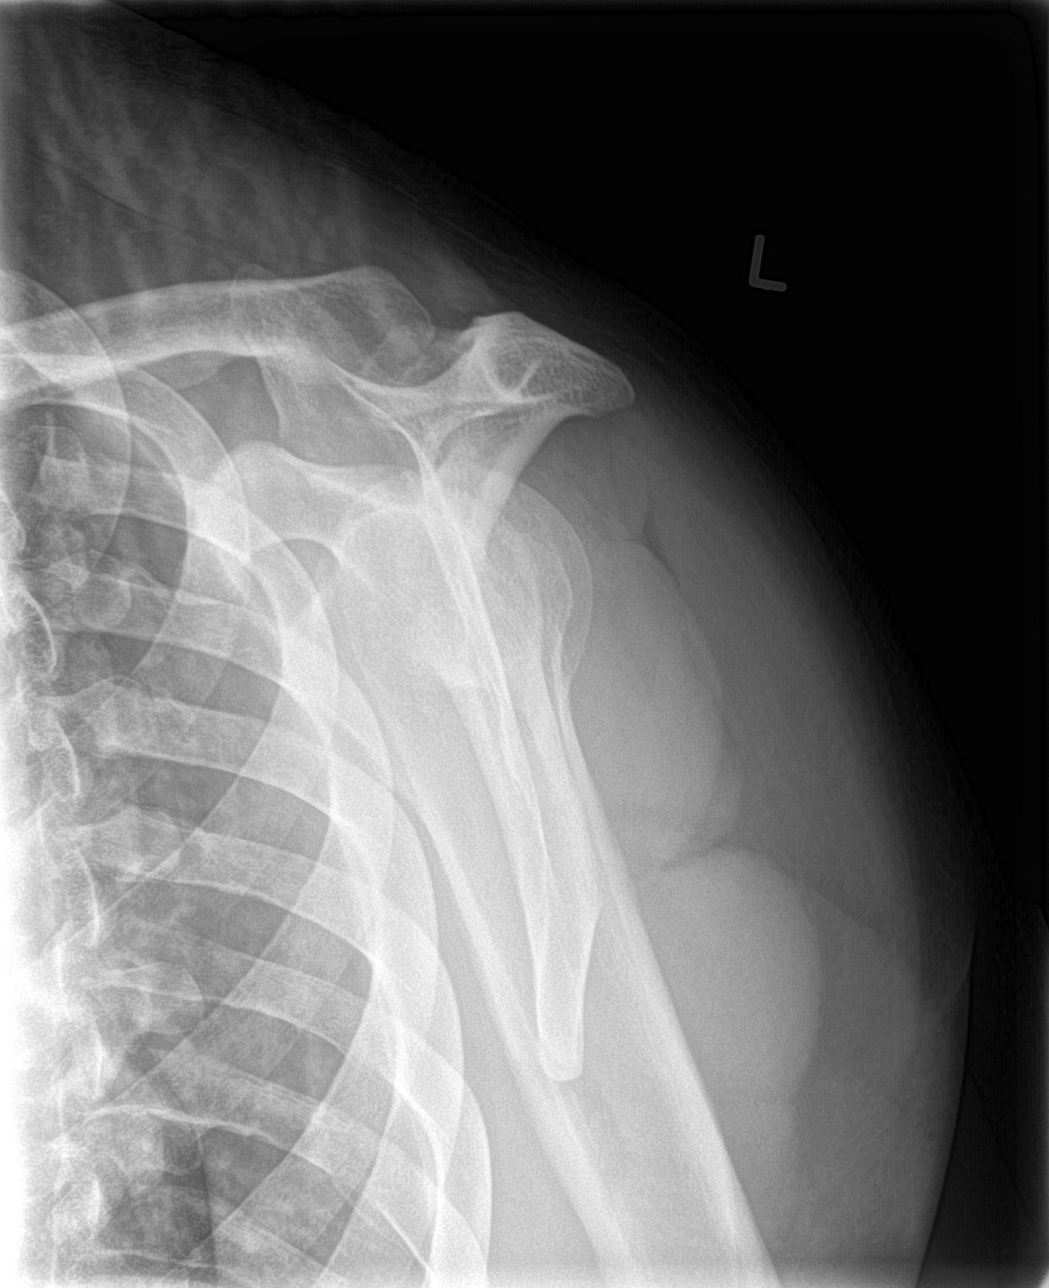
[im 3/3]
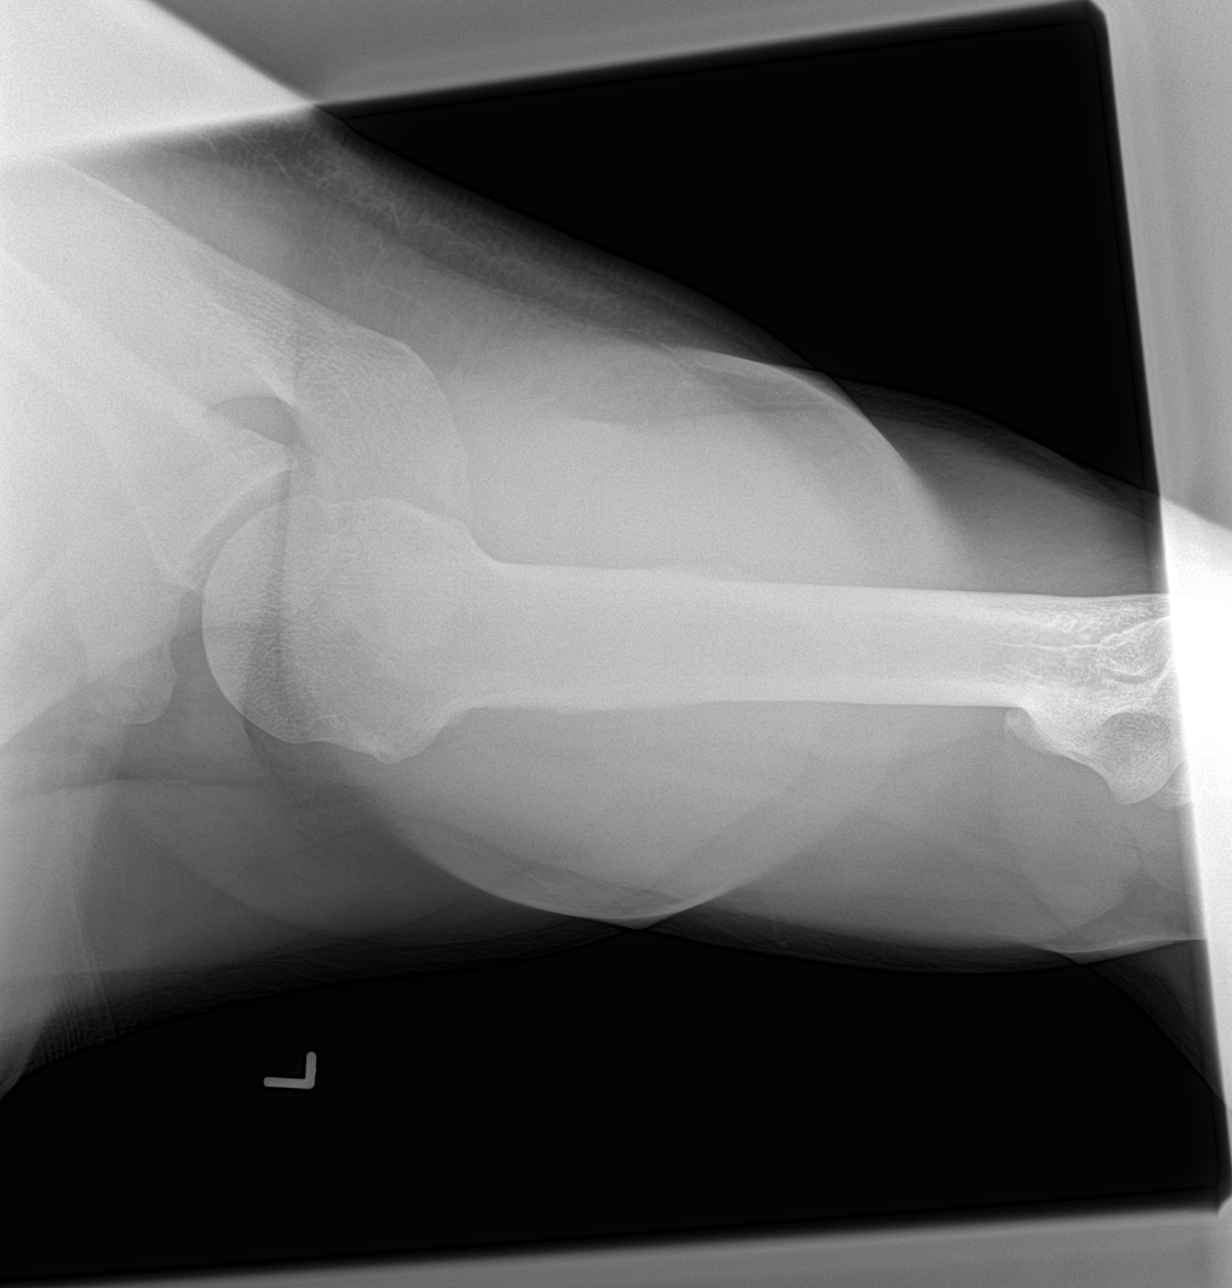

[3 of 3 positions shown; findings below may reference images not displayed]

FINDINGS: There is no evidence of fracture or dislocation. There is no
evidence of arthropathy or other focal bone abnormality. Soft
tissues are unremarkable.
IMPRESSION: Negative.

## 2016-09-11 MED ORDER — KETOROLAC TROMETHAMINE 30 MG/ML IJ SOLN
30.0000 mg | Freq: Once | INTRAMUSCULAR | Status: AC
  Administered 2016-09-11: 30 mg via INTRAMUSCULAR
  Filled 2016-09-11: qty 1

## 2016-09-11 NOTE — ED Provider Notes (Signed)
[facility] Emergency Department Provider Note   ____________________________________________    I have reviewed the triage vital signs and the nursing notes.   HISTORY  Chief Complaint Joint Swelling     HPI Roger Pennington is a 40 y.o. male who presents with complaints of left arm pain. Patient reports he developed pain in his left shoulder yesterday as well as his left elbow. He denies any trauma to the area. He denies IV drug abuse, no redness or injury to the area. No pain with range of motion of the elbow. Pain in the left shoulder with abduction of the left arm. No fevers or chills. No chest pain. No shortness of breath.   Past Medical History:  Diagnosis Date  . Tobacco abuse     Patient Active Problem List   Diagnosis Date Noted  . Upper GI bleed 02/08/2016    Past Surgical History:  Procedure Laterality Date  . ESOPHAGOGASTRODUODENOSCOPY (EGD) WITH PROPOFOL N/A 02/09/2016   Procedure: ESOPHAGOGASTRODUODENOSCOPY (EGD) WITH PROPOFOL;  Surgeon: Christena DeemMartin U Skulskie, MD;  [facility];  Service: Endoscopy;  Laterality: N/A;    Prior to Admission medications   Medication Sig Start Date End Date Taking? Authorizing Provider  HYDROcodone-acetaminophen (NORCO) 5-325 MG tablet Take 1-2 tablets by mouth every 4 (four) hours as needed for moderate pain. Patient not taking: Reported on 02/08/2016 11/17/15   Charmayne Sheerharles M Beers, PA-C  hydrocortisone (ANUSOL-HC) 2.5 % rectal cream Place rectally 4 (four) times daily. 02/10/16   [name], MD  pantoprazole (PROTONIX) 40 MG tablet Take 1 tablet (40 mg total) by mouth 2 (two) times daily. 02/10/16   [name], MD     Allergies Review of patient's allergies indicates no known allergies.  Family History  Problem Relation Age of Onset  . Hypertension Father   . Hypertension Brother     Social History Social History  Substance Use Topics  . Smoking status: Current Every Day Smoker  .  Smokeless tobacco: Never Used  . Alcohol use 0.0 oz/week     Comment: occ    Review of Systems  Constitutional: No fever/chills  ENT: No Neck pain   Gastrointestinal: No abdominal pain.   Musculoskeletal: Negative for back pain. As above Skin: Negative for rash. Neurological: Negative for weakness    ____________________________________________   PHYSICAL EXAM:  VITAL SIGNS: ED Triage Vitals  Enc Vitals Group     BP 09/11/16 1150 (!) 145/86     Pulse Rate 09/11/16 1150 77     Resp 09/11/16 1150 18     Temp 09/11/16 1150 98.3 F (36.8 C)     Temp src --      SpO2 09/11/16 1150 98 %     Weight 09/11/16 1151 260 lb (117.9 kg)     Height 09/11/16 1151 6' (1.829 m)     Head Circumference --      Peak Flow --      Pain Score 09/11/16 1151 9     Pain Loc --      Pain Edu? --      Excl. in GC? --     Constitutional: Alert and oriented. No acute distress  Head: Atraumatic.   Cardiovascular: Normal rate, regular rhythm. 2+ distal pulses in the left arm Respiratory: Normal respiratory effort.  No retractions. Genitourinary: deferred Musculoskeletal: Pain with abduction at the left [facility] joint, tenderness to palpation overlying the [facility] joint as well. No swelling of the left elbow, no  tenderness over the olecranon, no bony abnormalities, full range of motion of the left elbow without significant discomfort. Mild tenderness to palpation along the origin of the extensor tendon Neurologic:  Normal speech and language. No gross focal neurologic deficits are appreciated.   Skin:  Skin is warm, dry and intact. No rash noted.   ____________________________________________   LABS (all labs ordered are listed, but only abnormal results are displayed)  Labs Reviewed  CBC WITH DIFFERENTIAL/PLATELET  COMPREHENSIVE METABOLIC PANEL   ____________________________________________  EKG   ____________________________________________  RADIOLOGY  X-rays  unremarkable ____________________________________________   PROCEDURES  Procedure(s) performed: No    Critical Care performed: No ____________________________________________   INITIAL IMPRESSION / ASSESSMENT AND PLAN / ED COURSE  Pertinent labs & imaging results that were available during my care of the patient were reviewed by me and considered in my medical decision making (see chart for details).  Patient with left shoulder pain which seems to be limited to the [facility] joint as well as left elbow pain. No indication of infection, labs reassuring. No fever. We will treat conservatively and have the patient follow-up with PCP   ____________________________________________   FINAL CLINICAL IMPRESSION(S) / ED DIAGNOSES  Final diagnoses:  Left shoulder pain      NEW MEDICATIONS STARTED DURING THIS VISIT:  New Prescriptions   No medications on file     Note:  This document was prepared using Dragon voice recognition software and may include unintentional dictation errors.    [name], MD 09/11/16 [phone]

## 2016-09-11 NOTE — ED Triage Notes (Signed)
Reports left elbow pain since yesterday.  Worse with movement.  States "it feels like its locked up".

## 2016-09-27 ENCOUNTER — Emergency Department
Admission: EM | Admit: 2016-09-27 | Discharge: 2016-09-27 | Disposition: A | Discharge status: Home / Self Care | Payer: PRIVATE HEALTH INSURANCE | Attending: Student in an Organized Health Care Education/Training Program | Admitting: Student in an Organized Health Care Education/Training Program

## 2016-09-27 ENCOUNTER — Emergency Department: Payer: PRIVATE HEALTH INSURANCE

## 2016-09-27 ENCOUNTER — Encounter: Payer: Self-pay | Admitting: Emergency Medicine

## 2016-09-27 DIAGNOSIS — Z79899 Other long term (current) drug therapy: Secondary | ICD-10-CM | POA: Diagnosis not present

## 2016-09-27 DIAGNOSIS — Y990 Civilian activity done for income or pay: Secondary | ICD-10-CM | POA: Diagnosis not present

## 2016-09-27 DIAGNOSIS — Y9389 Activity, other specified: Secondary | ICD-10-CM | POA: Diagnosis not present

## 2016-09-27 DIAGNOSIS — S61011A Laceration without foreign body of right thumb without damage to nail, initial encounter: Secondary | ICD-10-CM | POA: Insufficient documentation

## 2016-09-27 DIAGNOSIS — F17200 Nicotine dependence, unspecified, uncomplicated: Secondary | ICD-10-CM | POA: Insufficient documentation

## 2016-09-27 DIAGNOSIS — Y929 Unspecified place or not applicable: Secondary | ICD-10-CM | POA: Diagnosis not present

## 2016-09-27 DIAGNOSIS — S60011A Contusion of right thumb without damage to nail, initial encounter: Secondary | ICD-10-CM

## 2016-09-27 DIAGNOSIS — W230XXA Caught, crushed, jammed, or pinched between moving objects, initial encounter: Secondary | ICD-10-CM | POA: Insufficient documentation

## 2016-09-27 IMAGING — DX DG FINGER THUMB 2+V*R*
3 series · 3 of 3 positions shown · non-contrast
Comparison: None.

CLINICAL DATA: Thigh thumb caught in a paper grinder.

EXAM:
RIGHT THUMB 2+V

[finger ap]
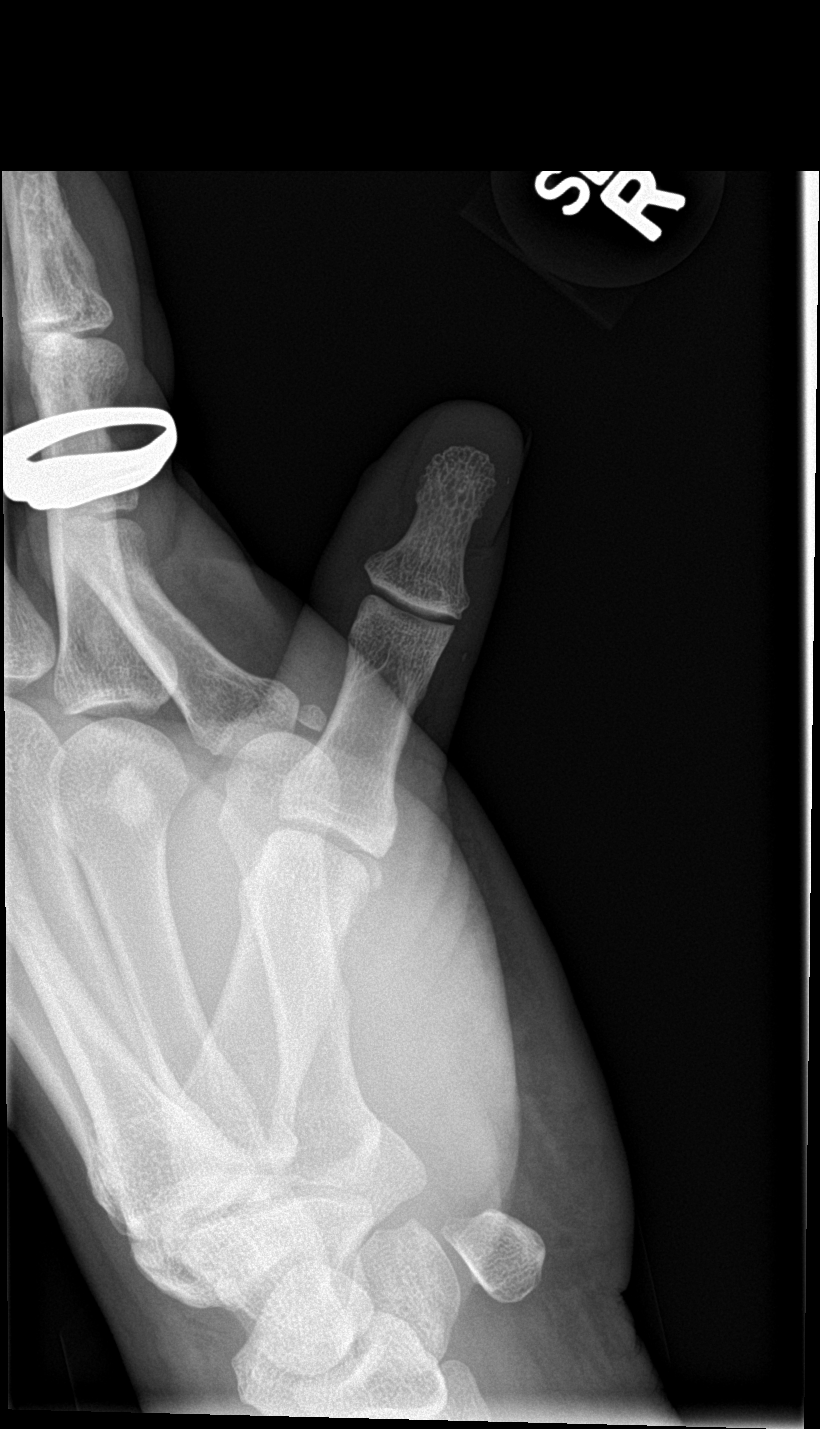

[finger obl]
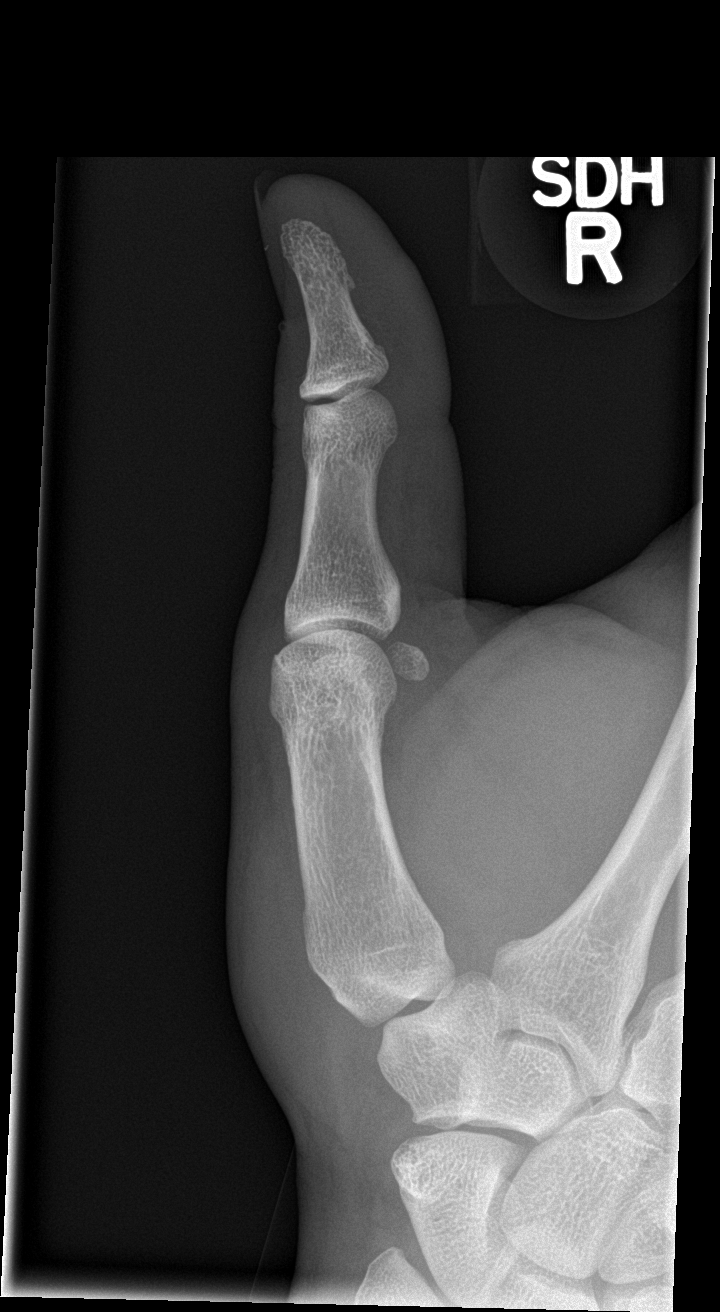

[finger lat]
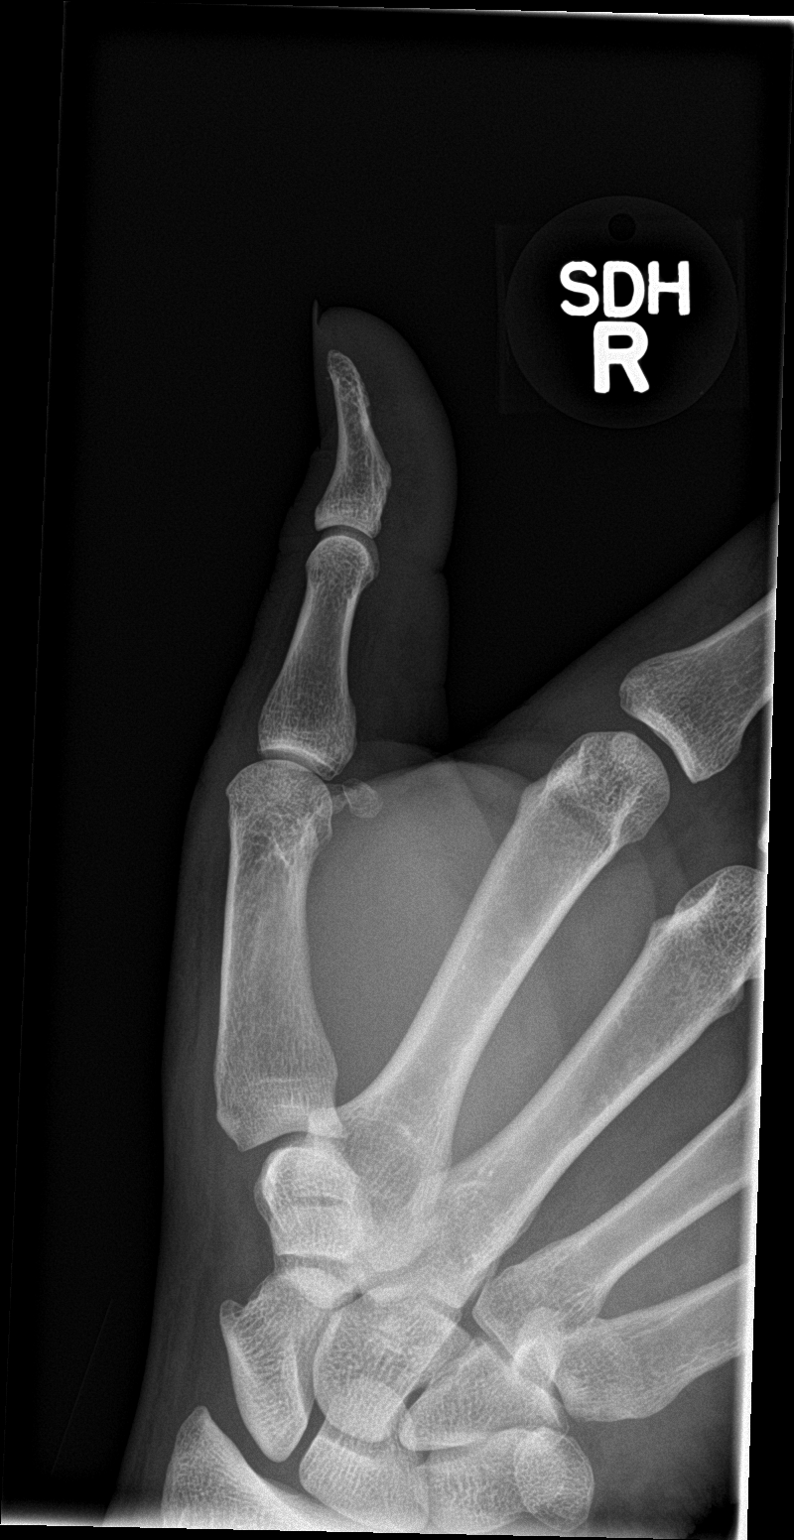

[3 of 3 positions shown; findings below may reference images not displayed]

FINDINGS: There is no evidence of fracture or dislocation. There is no
evidence of arthropathy or other focal bone abnormality. Soft
tissues are unremarkable
IMPRESSION: Negative.

## 2016-09-27 MED ORDER — NAPROXEN 500 MG PO TABS: 500.0000 mg | ORAL_TABLET | Freq: Two times a day (BID) | ORAL | 0 refills | Status: DC

## 2016-09-27 MED ORDER — BACITRACIN ZINC 500 UNIT/GM EX OINT
TOPICAL_OINTMENT | Freq: Every day | CUTANEOUS | Status: DC
  Administered 2016-09-27: 1 via TOPICAL
  Filled 2016-09-27: qty 0.9

## 2016-09-27 NOTE — ED Provider Notes (Signed)
[facility] Emergency Department Provider Note  ____________________________________________  Time seen: Approximately 10:52 AM  I have reviewed the triage vital signs and the nursing notes.   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 male, NAD, presents to the emergency department with right thumb pain and laceration.  Patient states that about 30 minutes ago, he got his right thumb caught in a grinder at work.  He states that the distal joint of the thumb "got mashed" by the machine.  He reports a small laceration superior to the right thumb nail, as well as swelling and bruising of the distal joint of the right thumb.  Patient reports moderate bleeding at the time that lessened with application of a gauze wrap in triage.  He states his pain is greatest at the distal joint of the right thumb.  He reports numbness of the distal phalanx of the right thumb and inability to flex the right thumb at the interphalangeal joint due to pain.  He states he retains full ROM of the other fingers, wrist and elbow.  He denies any other injuries at this time.  He states he has not taken any medications for pain control since the time of the incident.  He states he wishes to decline filing worker's compensation.  Patient notes his tetanus vaccine was updated 1 year ago.   Past Medical History:  Diagnosis Date  . Tobacco abuse     Patient Active Problem List   Diagnosis Date Noted  . Upper GI bleed 02/08/2016    Past Surgical History:  Procedure Laterality Date  . ESOPHAGOGASTRODUODENOSCOPY (EGD) WITH PROPOFOL N/A 02/09/2016   Procedure: ESOPHAGOGASTRODUODENOSCOPY (EGD) WITH PROPOFOL;  Surgeon: Christena DeemMartin U Skulskie, MD;  [facility];  Service: Endoscopy;  Laterality: N/A;    Prior to Admission medications   Medication Sig Start Date End Date Taking? Authorizing Provider  naproxen (NAPROSYN) 500 MG tablet Take 1 tablet (500 mg total) by  mouth 2 (two) times daily with a meal. 09/27/16   [name], PA-C  pantoprazole (PROTONIX) 40 MG tablet Take 1 tablet (40 mg total) by mouth 2 (two) times daily. 02/10/16   [name], MD    Allergies Review of patient's allergies indicates no known allergies.  Family History  Problem Relation Age of Onset  . Hypertension Father   . Hypertension Brother     Social History Social History  Substance Use Topics  . Smoking status: Current Every Day Smoker  . Smokeless tobacco: Never Used  . Alcohol use 0.0 oz/week     Comment: occ     Review of Systems  Constitutional: No fever/chills Musculoskeletal: Positive for Pain right thumb.  Skin: Positive superficial laceration right thumb. Positive swelling, bruising right thumb. Negative for rash.   Neurological: Negative for Tingling, weakness Positive for numbness of the distal phalanx of the right thumb.   10-point ROS otherwise negative.  ____________________________________________   PHYSICAL EXAM:  VITAL SIGNS: ED Triage Vitals  Enc Vitals Group     BP 09/27/16 1042 (!) 142/95     Pulse Rate 09/27/16 1042 73     Resp 09/27/16 1042 16     Temp 09/27/16 1042 98.3 F (36.8 C)     Temp Source 09/27/16 1042 Oral     SpO2 09/27/16 1042 97 %     Weight 09/27/16 1042 243 lb (110.2 kg)     Height 09/27/16 1042 6' (1.829 m)  Head Circumference --      Peak Flow --      Pain Score 09/27/16 1049 6     Pain Loc --      Pain Edu? --      Excl. in GC? --      Constitutional: Alert and oriented. Well appearing and in no acute distress. Eyes: Conjunctivae are normal.  Head: Atraumatic. Cardiovascular: Good peripheral circulation with 2+ pulses noted in the right upper extremity. Capillary refill is brisk in all digits of the right hand. Respiratory: Normal respiratory effort without tachypnea or retractions.  Musculoskeletal: Tenderness to palpation diffusely about the distal phalanx of the right thumb. Full passive  range of motion of the right thumb without crepitus or difficulty. Full active range of motion of the right thumb at the PIP but limited active range of motion of the DIP due to pain and swelling. Neurologic:  Normal speech and language. No gross focal neurologic deficits are appreciated. Sensation intact about the right thumb. Skin:  Superficial laceration noted about the distal, dorsal right thumb at the base of the nail bed without damage to the nail. No active bleeding, oozing or weeping. Moderate ecchymosis is noted about the distal phalanx with a hematoma noted on the pad of the right thumb. Skin is warm, dry. No rash noted. Psychiatric: Mood and affect are normal. Speech and behavior are normal. Patient exhibits appropriate insight and judgement.   ____________________________________________   LABS  None ____________________________________________  EKG  None ____________________________________________  RADIOLOGY I, Hope Pigeon, personally viewed and evaluated these images (plain radiographs) as part of my medical decision making, as well as reviewing the written report by the radiologist.  Dg Finger Thumb Right  Result Date: 09/27/2016 CLINICAL DATA:  Thigh thumb caught in a paper grinder. EXAM: RIGHT THUMB 2+V COMPARISON:  None. FINDINGS: There is no evidence of fracture or dislocation. There is no evidence of arthropathy or other focal bone abnormality. Soft tissues are unremarkable IMPRESSION: Negative. Electronically Signed   By: Elige Ko   On: 09/27/2016 11:38    ____________________________________________    PROCEDURES  Procedure(s) performed: None   Procedures   Medications  bacitracin ointment (1 application Topical Given 09/27/16 1155)     ____________________________________________   INITIAL IMPRESSION / ASSESSMENT AND PLAN / ED COURSE  Pertinent labs & imaging results that were available during my care of the patient were reviewed by me  and considered in my medical decision making (see chart for details).  Clinical Course    Patient's diagnosis is consistent with Laceration of right thumb without foreign body and without damage to nail and contusion of right thumb. Patient will be discharged home with prescriptions for naproxen to take as directed. Patient is to apply ice to the affected area 20 minutes 3-4 times daily keep elevated when not using. Patient should also apply antibiotic ointment in a thin layer 1 time daily with bandage changes until wound is healed. Patient was given a work note to limit strong gripping with the right hand over the next 3 days. Patient is to follow up with [facility] if symptoms persist past this treatment course. Patient is given ED precautions to return to the ED for any worsening or new symptoms.    ____________________________________________  FINAL CLINICAL IMPRESSION(S) / ED DIAGNOSES  Final diagnoses:  Laceration of right thumb without foreign body without damage to nail, initial encounter  Contusion of right thumb without damage to nail, initial encounter  NEW MEDICATIONS STARTED DURING THIS VISIT:  Discharge Medication List as of 09/27/2016 11:52 AM    START taking these medications   Details  naproxen (NAPROSYN) 500 MG tablet Take 1 tablet (500 mg total) by mouth 2 (two) times daily with a meal., Starting Wed 09/27/2016, Print             Ernestene Kiel [place], PA-C 09/27/16 1416    Willy Eddy, MD 09/27/16 [phone]

## 2016-09-27 NOTE — Discharge Instructions (Signed)
Keep wound clean and dry until healed. May place antibiotic ointment on the wound in a thin layer at dressing changes. Keep covered while working until wound fully healed.   Ice to the affected area x 20 minutes 3-4 times daily

## 2016-09-27 NOTE — ED Triage Notes (Signed)
States he injured his right thumb in grind machine at work  Right thumb is tender and swollen

## 2017-06-30 ENCOUNTER — Emergency Department
Admission: EM | Admit: 2017-06-30 | Discharge: 2017-07-01 | Disposition: A | Discharge status: Other Acute Inpatient Hospital | Payer: PRIVATE HEALTH INSURANCE | Attending: Emergency Medicine | Admitting: Emergency Medicine

## 2017-06-30 ENCOUNTER — Encounter: Payer: Self-pay | Admitting: Emergency Medicine

## 2017-06-30 DIAGNOSIS — R45851 Suicidal ideations: Secondary | ICD-10-CM | POA: Insufficient documentation

## 2017-06-30 DIAGNOSIS — F17210 Nicotine dependence, cigarettes, uncomplicated: Secondary | ICD-10-CM | POA: Insufficient documentation

## 2017-06-30 DIAGNOSIS — F329 Major depressive disorder, single episode, unspecified: Secondary | ICD-10-CM | POA: Insufficient documentation

## 2017-06-30 DIAGNOSIS — Z79899 Other long term (current) drug therapy: Secondary | ICD-10-CM | POA: Insufficient documentation

## 2017-06-30 DIAGNOSIS — F32A Depression, unspecified: Secondary | ICD-10-CM

## 2017-06-30 LAB — COMPREHENSIVE METABOLIC PANEL
ALK PHOS: 66 U/L (ref 38–126)
ALT: 27 U/L (ref 17–63)
AST: 22 U/L (ref 15–41)
Albumin: 4.2 g/dL (ref 3.5–5.0)
Anion gap: 6 (ref 5–15)
BILIRUBIN TOTAL: 0.7 mg/dL (ref 0.3–1.2)
BUN: 10 mg/dL (ref 6–20)
CALCIUM: 9.3 mg/dL (ref 8.9–10.3)
CO2: 26 mmol/L (ref 22–32)
CREATININE: 1.13 mg/dL (ref 0.61–1.24)
Chloride: 107 mmol/L (ref 101–111)
GFR calc Af Amer: >60 mL/min (ref >60)
Glucose, Bld: 175 mg/dL — ABNORMAL HIGH (ref 65–99)
Potassium: 3.4 mmol/L — ABNORMAL LOW (ref 3.5–5.1)
Sodium: 139 mmol/L (ref 135–145)
TOTAL PROTEIN: 7.8 g/dL (ref 6.5–8.1)

## 2017-06-30 LAB — CBC
HCT: 40.1 % (ref 40.0–52.0)
Hemoglobin: 12.7 g/dL — ABNORMAL LOW (ref 13.0–18.0)
MCH: 25.3 pg — ABNORMAL LOW (ref 26.0–34.0)
MCHC: 31.8 g/dL — ABNORMAL LOW (ref 32.0–36.0)
MCV: 79.4 fL — AB (ref 80.0–100.0)
PLATELETS: 205 10*3/uL (ref 150–440)
RBC: 5.04 MIL/uL (ref 4.40–5.90)
RDW: 13.8 % (ref 11.5–14.5)
WBC: 5.9 10*3/uL (ref 3.8–10.6)

## 2017-06-30 LAB — ACETAMINOPHEN LEVEL: Acetaminophen (Tylenol), Serum: <10 ug/mL — ABNORMAL LOW (ref 10–30)

## 2017-06-30 LAB — URINE DRUG SCREEN, QUALITATIVE (ARMC ONLY)
Amphetamines, Ur Screen: NOT DETECTED
BENZODIAZEPINE, UR SCRN: NOT DETECTED
Barbiturates, Ur Screen: NOT DETECTED
CANNABINOID 50 NG, UR ~~LOC~~: POSITIVE — AB
Cocaine Metabolite,Ur ~~LOC~~: NOT DETECTED
MDMA (Ecstasy)Ur Screen: NOT DETECTED
Methadone Scn, Ur: NOT DETECTED
OPIATE, UR SCREEN: NOT DETECTED
PHENCYCLIDINE (PCP) UR S: NOT DETECTED
Tricyclic, Ur Screen: NOT DETECTED

## 2017-06-30 LAB — ETHANOL

## 2017-06-30 LAB — SALICYLATE LEVEL: Salicylate Lvl: <7 mg/dL (ref 2.8–30.0)

## 2017-06-30 NOTE — ED Notes (Signed)

## 2017-06-30 NOTE — ED Notes (Signed)

## 2017-06-30 NOTE — ED Notes (Signed)
Pt reports to this RN that over the last month he haws lost 3 close family members/friends. Pt reports approximately 1 month ago his aunt dies then approximately a week later his best friend died of cancer and a few days ago another friend killed himself. Pt reports hx of inpatient admission years ago due to loss and resulting depression.

## 2017-06-30 NOTE — ED Notes (Signed)
Patient to stat desk with [place] PD officer Way with IVC papers stating patient having SI.

## 2017-06-30 NOTE — ED Notes (Signed)
Report was received from Dorise Hiss., RN; Pt. 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 Continue to monitor with 15 min. Monitoring.

## 2017-06-30 NOTE — ED Triage Notes (Signed)
Pt states he has been suicidal for "awhile". Pt present with [place] [employer] with ivc papers in place. Pt cooperative but appears depressed.

## 2017-06-30 NOTE — ED Provider Notes (Signed)
[facility] Emergency Department Provider Note   ____________________________________________   I have reviewed the triage vital signs and the nursing notes.   HISTORY  Chief Complaint Suicidal   History limited by: Not Limited   HPI Roger Pennington is a 41 y.o. male who presents to the emergency department today under IVC because of concerns for depression and suicidal ideation. The patient has been depressed for a few weeks. Has recently lost three people close to him and has been having relationship troubles as well. Has had thoughts of wanting to "go away." Patient has a history of depression and suicidal ideation when he was younger. Denies any recent illness.    Past Medical History:  Diagnosis Date  . Tobacco abuse     Patient Active Problem List   Diagnosis Date Noted  . Upper GI bleed 02/08/2016    Past Surgical History:  Procedure Laterality Date  . ESOPHAGOGASTRODUODENOSCOPY (EGD) WITH PROPOFOL N/A 02/09/2016   Procedure: ESOPHAGOGASTRODUODENOSCOPY (EGD) WITH PROPOFOL;  Surgeon: Christena Deem, MD;  Location: [facility] ENDOSCOPY;  Service: Endoscopy;  Laterality: N/A;    Prior to Admission medications   Medication Sig Start Date End Date Taking? Authorizing Provider  naproxen (NAPROSYN) 500 MG tablet Take 1 tablet (500 mg total) by mouth 2 (two) times daily with a meal. 09/27/16   Hagler, Jami L, PA-C  pantoprazole (PROTONIX) 40 MG tablet Take 1 tablet (40 mg total) by mouth 2 (two) times daily. 02/10/16   Adrian Saran, MD    Allergies Patient has no known allergies.  Family History  Problem Relation Age of Onset  . Hypertension Father   . Hypertension Brother     Social History Social History  Substance Use Topics  . Smoking status: Current Every Day Smoker  . Smokeless tobacco: Never Used  . Alcohol use 0.0 oz/week     Comment: occ    Review of Systems Constitutional: No fever/chills Eyes: No visual changes. ENT: No sore  throat. Cardiovascular: Denies chest pain. Respiratory: Denies shortness of breath. Gastrointestinal: No abdominal pain.  No nausea, no vomiting.  No diarrhea.   Genitourinary: Negative for dysuria. Musculoskeletal: Negative for back pain. Skin: Negative for rash. Neurological: Negative for headaches, focal weakness or numbness. Psychiatric: Positive for depression. ____________________________________________   PHYSICAL EXAM:  VITAL SIGNS: ED Triage Vitals  Enc Vitals Group     BP 06/30/17 1941 (!) 165/96     Pulse Rate 06/30/17 1941 80     Resp 06/30/17 1941 16     Temp 06/30/17 1941 98.6 F (37 C)     Temp Source 06/30/17 1941 Oral     SpO2 06/30/17 1941 97 %     Weight 06/30/17 1940 276 lb (125.2 kg)     Height 06/30/17 1940 6' (1.829 m)    Constitutional: Alert and oriented. Depressed. Eyes: Conjunctivae are normal.  ENT   Head: Normocephalic and atraumatic.   Nose: No congestion/rhinnorhea.   Mouth/Throat: Mucous membranes are moist.   Neck: No stridor. Hematological/Lymphatic/Immunilogical: No cervical lymphadenopathy. Cardiovascular: Normal rate, regular rhythm.  No murmurs, rubs, or gallops. Respiratory: Normal respiratory effort without tachypnea nor retractions. Breath sounds are clear and equal bilaterally. No wheezes/rales/rhonchi. Gastrointestinal: Soft and non tender. No rebound. No guarding.  Genitourinary: Deferred Musculoskeletal: Normal range of motion in all extremities.  Neurologic:  Normal speech and language. No gross focal neurologic deficits are appreciated.  Skin:  Skin is warm, dry and intact. No rash noted. Psychiatric: Depressed.  ____________________________________________  LABS (pertinent positives/negatives)  Labs Reviewed  COMPREHENSIVE METABOLIC PANEL - Abnormal; Notable for the following:       Result Value   Potassium 3.4 (*)    Glucose, Bld 175 (*)    All other components within normal limits  ACETAMINOPHEN  LEVEL - Abnormal; Notable for the following:    Acetaminophen (Tylenol), Serum <10 (*)    All other components within normal limits  CBC - Abnormal; Notable for the following:    Hemoglobin 12.7 (*)    MCV 79.4 (*)    MCH 25.3 (*)    MCHC 31.8 (*)    All other components within normal limits  URINE DRUG SCREEN, QUALITATIVE ([facility] ONLY) - Abnormal; Notable for the following:    Cannabinoid 50 Ng, Ur Mamou POSITIVE (*)    All other components within normal limits  ETHANOL  SALICYLATE LEVEL     ____________________________________________   EKG  None  ____________________________________________    RADIOLOGY  None  ____________________________________________   PROCEDURES  Procedures  ____________________________________________   INITIAL IMPRESSION / ASSESSMENT AND PLAN / ED COURSE  Pertinent labs & imaging results that were available during my care of the patient were reviewed by me and considered in my medical decision making (see chart for details).  Patient presented to the emergency department under IVC. Patient is quite depressed on my exam. Will have patient evaluated by psychiatry.  ____________________________________________   FINAL CLINICAL IMPRESSION(S) / ED DIAGNOSES  Final diagnoses:  Depression, unspecified depression type  Suicidal thoughts     Note: This dictation was prepared with Dragon dictation. Any transcriptional errors that result from this process are unintentional     Phineas Semen, MD 06/30/17 2311

## 2017-06-30 NOTE — ED Notes (Signed)
Report called to Margaret, RN in the ED BHU.  

## 2017-06-30 NOTE — ED Notes (Signed)
BEHAVIORAL HEALTH ROUNDING  Patient sleeping: No.  Patient alert and oriented: yes  Behavior appropriate: Yes. ; If no, describe:  Nutrition and fluids offered: Yes  Toileting and hygiene offered: Yes  Sitter present: not applicable, Q 15 min safety rounds and observation.  Law enforcement present: Yes ODS  

## 2017-07-01 ENCOUNTER — Inpatient Hospital Stay
Admission: AD | Admit: 2017-07-01 | Discharge: 2017-07-05 | DRG: 885 | Disposition: A | Discharge status: Home / Self Care | Payer: No Typology Code available for payment source | Attending: Psychiatry | Admitting: Psychiatry

## 2017-07-01 DIAGNOSIS — F1220 Cannabis dependence, uncomplicated: Secondary | ICD-10-CM

## 2017-07-01 DIAGNOSIS — Z915 Personal history of self-harm: Secondary | ICD-10-CM | POA: Diagnosis not present

## 2017-07-01 DIAGNOSIS — Z79899 Other long term (current) drug therapy: Secondary | ICD-10-CM | POA: Diagnosis not present

## 2017-07-01 DIAGNOSIS — F17210 Nicotine dependence, cigarettes, uncomplicated: Secondary | ICD-10-CM | POA: Diagnosis present

## 2017-07-01 DIAGNOSIS — I10 Essential (primary) hypertension: Secondary | ICD-10-CM | POA: Diagnosis present

## 2017-07-01 DIAGNOSIS — F332 Major depressive disorder, recurrent severe without psychotic features: Principal | ICD-10-CM | POA: Diagnosis present

## 2017-07-01 DIAGNOSIS — G4700 Insomnia, unspecified: Secondary | ICD-10-CM | POA: Diagnosis present

## 2017-07-01 DIAGNOSIS — R45851 Suicidal ideations: Secondary | ICD-10-CM | POA: Diagnosis present

## 2017-07-01 DIAGNOSIS — F329 Major depressive disorder, single episode, unspecified: Secondary | ICD-10-CM | POA: Diagnosis present

## 2017-07-01 DIAGNOSIS — Z8711 Personal history of peptic ulcer disease: Secondary | ICD-10-CM

## 2017-07-01 DIAGNOSIS — K922 Gastrointestinal hemorrhage, unspecified: Secondary | ICD-10-CM | POA: Diagnosis present

## 2017-07-01 DIAGNOSIS — Z720 Tobacco use: Secondary | ICD-10-CM

## 2017-07-01 DIAGNOSIS — F17200 Nicotine dependence, unspecified, uncomplicated: Secondary | ICD-10-CM

## 2017-07-01 MED ORDER — CITALOPRAM HYDROBROMIDE 20 MG PO TABS
20.0000 mg | ORAL_TABLET | Freq: Every day | ORAL | Status: DC
  Filled 2017-07-01: qty 1

## 2017-07-01 NOTE — BH Assessment (Signed)
Assessment Note  Roger Pennington is an 41 y.o. male who presents to the ER due to having thoughts of ending his life. A family member found patient, in the back parking lot of an apartment complex. He had made plans to cut his wrist. Patient sent his family a video apologizing via social media, prior to going to the complex. Patient states, within the last month he has had several losses. An aunt passed and three of his closes friends. All died a week within each other. "We just buried the last one, last Saturday (06/23/2017). He also reports his girlfriend recently graduated from college and told him on yesterday she's made plans to move back to [place] and he could not move with her.  They've been together for approximately five years.  Patient has had one Psychiatric Hospitalization. It was when he was 41 years old. He states, his child's mother moved to [place] and would not allow him to see the child. He became overwhelmed and cut his wrist. He admits to current use of cannabis. It's on a daily basis. He denies the use of any other mind-altering substance.  During the interview, he was calm, cooperative and pleasant. He was able to give appropriate answers to the questions. He denies current involvement with the legal system. He also denies history of aggression and violence. Patient denies HI and AV/H.  Diagnosis: Depression  Past Medical History:  Past Medical History:  Diagnosis Date  . Tobacco abuse     Past Surgical History:  Procedure Laterality Date  . ESOPHAGOGASTRODUODENOSCOPY (EGD) WITH PROPOFOL N/A 02/09/2016   Procedure: ESOPHAGOGASTRODUODENOSCOPY (EGD) WITH PROPOFOL;  Surgeon: Christena Deem, MD;  Location: [facility] ENDOSCOPY;  Service: Endoscopy;  Laterality: N/A;    Family History:  Family History  Problem Relation Age of Onset  . Hypertension Father   . Hypertension Brother     Social History:  reports that he has been smoking.  He has never used smokeless  tobacco. He reports that he drinks alcohol. He reports that he does not use drugs.  Additional Social History:  Alcohol / Drug Use Pain Medications: See PTA Prescriptions: See PTA Over the Counter: See PTA History of alcohol / drug use?: Yes Longest period of sobriety (when/how long): Unable to quantify  Negative Consequences of Use:  (Reports of none) Withdrawal Symptoms:  (n/a) Substance #1 Name of Substance 1: Cannabis 1 - Frequency: Daily 1 - Last Use / Amount: 06/30/2017  CIWA: CIWA-Ar BP: 135/88 Pulse Rate: 81 COWS:    Allergies: No Known Allergies  Home Medications:  (Not in a hospital admission)  OB/GYN Status:  No LMP for male patient.  General Assessment Data Location of Assessment: [facility] ED TTS Assessment: In system Is this a Tele or Face-to-Face Assessment?: Face-to-Face Is this an Initial Assessment or a Re-assessment for this encounter?: Initial Assessment Marital status: Long term relationship (Five years) Jordan name: n/a Is patient pregnant?: No Pregnancy Status: No Living Arrangements: Spouse/significant other Can pt return to current living arrangement?: Yes Admission Status: Involuntary Is patient capable of signing voluntary admission?: No (Under IVC) Referral Source: Self/Family/Friend Insurance type: n/a  Medical Screening Exam [facility] Walk-in ONLY) Medical Exam completed: Yes  Crisis Care Plan Living Arrangements: Spouse/significant other Legal Guardian: Other: (Self) Name of Psychiatrist: Reports of none Name of Therapist: Reports of none  Education Status Is patient currently in school?: No Current Grade: n/a Highest grade of school patient has completed: n/a Name of school: n/a Contact person:  n/a  Risk to self with the past 6 months Suicidal Ideation: Yes-Currently Present Has patient been a risk to self within the past 6 months prior to admission? : Yes Suicidal Intent: Yes-Currently Present Has patient had any suicidal intent  within the past 6 months prior to admission? : Yes Is patient at risk for suicide?: Yes Suicidal Plan?: Yes-Currently Present Has patient had any suicidal plan within the past 6 months prior to admission? : Yes Specify Current Suicidal Plan: Cut wrist Access to Means: Yes Specify Access to Suicidal Means: Knives What has been your use of drugs/alcohol within the last 12 months?: Cannabis Previous Attempts/Gestures: Yes How many times?: 1 Other Self Harm Risks: Reports of none Triggers for Past Attempts: Spouse contact, Other (Comment) Intentional Self Injurious Behavior: None Family Suicide History: No Recent stressful life event(s): Conflict (Comment), Loss (Comment), Other (Comment) Persecutory voices/beliefs?: No Depression: Yes Depression Symptoms: Insomnia, Isolating, Fatigue, Guilt, Loss of interest in usual pleasures, Feeling worthless/self pity Substance abuse history and/or treatment for substance abuse?: Yes Suicide prevention information given to non-admitted patients: Not applicable  Risk to Others within the past 6 months Homicidal Ideation: No Does patient have any lifetime risk of violence toward others beyond the six months prior to admission? : No Thoughts of Harm to Others: No Current Homicidal Intent: No Current Homicidal Plan: No Access to Homicidal Means: No Identified Victim: Reports of none History of harm to others?: No Assessment of Violence: None Noted Does patient have access to weapons?: No Criminal Charges Pending?: No Does patient have a court date: No Is patient on probation?: No  Psychosis Hallucinations: None noted Delusions: None noted  Mental Status Report Appearance/Hygiene: Unremarkable, In scrubs Eye Contact: Good Motor Activity: Freedom of movement, Unremarkable Speech: Logical/coherent Level of Consciousness: Alert Mood: Depressed, Anxious, Sad, Pleasant, Helpless, Despair Affect: Appropriate to circumstance, Depressed,  Sad Anxiety Level: Minimal Thought Processes: Coherent, Relevant Judgement: Unimpaired Orientation: Person, Place, Time, Situation, Appropriate for developmental age Obsessive Compulsive Thoughts/Behaviors: Minimal  Cognitive Functioning Concentration: Normal Memory: Recent Intact, Remote Intact IQ: Average Insight: Fair Impulse Control: Poor Appetite: Good Weight Loss: 0 Weight Gain: 0 Sleep: Decreased (Trouble staying asleep) Total Hours of Sleep: 5 Vegetative Symptoms: None  ADLScreening [facility] Assessment Services) Patient's cognitive ability adequate to safely complete daily activities?: Yes Patient able to express need for assistance with ADLs?: Yes Independently performs ADLs?: Yes (appropriate for developmental age)  Prior Inpatient Therapy Prior Inpatient Therapy: Yes Prior Therapy Dates: 1995 Prior Therapy Facilty/Provider(s): [facility] Reason for Treatment: Depression  Prior Outpatient Therapy Prior Outpatient Therapy: Yes Prior Therapy Dates: 2015 Prior Therapy Facilty/Provider(s): Unable to remember the name Reason for Treatment: Substance Abuse Treatment Does patient have an ACCT team?: No Does patient have [facility] services? : No Does patient have P4CC services?: No  ADL Screening (condition at time of admission) Patient's cognitive ability adequate to safely complete daily activities?: Yes Is the patient deaf or have difficulty hearing?: No Does the patient have difficulty seeing, even when wearing glasses/contacts?: No Does the patient have difficulty concentrating, remembering, or making decisions?: No Patient able to express need for assistance with ADLs?: Yes Does the patient have difficulty dressing or bathing?: No Independently performs ADLs?: Yes (appropriate for developmental age) Does the patient have difficulty walking or climbing stairs?: No Weakness of Legs: None Weakness of Arms/Hands: None  Home Assistive Devices/Equipment Home Assistive  Devices/Equipment: None  Therapy Consults (therapy consults require a physician order) PT Evaluation Needed: No OT Evalulation Needed:  No SLP Evaluation Needed: No Abuse/Neglect Assessment (Assessment to be complete while patient is alone) Physical Abuse: Denies Verbal Abuse: Denies Sexual Abuse: Denies Exploitation of patient/patient's resources: Denies Self-Neglect: Denies Values / Beliefs Cultural Requests During Hospitalization: None Spiritual Requests During Hospitalization: None Consults Spiritual Care Consult Needed: No Social Work Consult Needed: No Merchant navy officer (For Healthcare) Does Patient Have a Medical Advance Directive?: No    Additional Information 1:1 In Past 12 Months?: No CIRT Risk: No Elopement Risk: No Does patient have medical clearance?: Yes  Child/Adolescent Assessment Running Away Risk: Denies (Patient is an adult)  Disposition:  Disposition Initial Assessment Completed for this Encounter: Yes Disposition of Patient: Other dispositions (ER MD Ordered Psych Consult)  On Site Evaluation by:   Reviewed with Physician:    Lilyan Gilford MS, LCAS, LPC, NCC, CCSI Therapeutic Triage Specialist 07/01/2017 11:50 AM

## 2017-07-01 NOTE — ED Notes (Signed)
Patient given new scrubs, socks, underwear, towels and toiletries and is taking a shower

## 2017-07-01 NOTE — ED Notes (Addendum)
Patient brought breakfast. Pt awake and alert sitting up in bed- reports that he slept well last night. Pt denies SI, but smiles and states, "I guess I'll say anything to get out of here." Pt remains safe with 15 minute checks

## 2017-07-01 NOTE — ED Notes (Addendum)
Patient voiced concern about his elevated blood pressure. Spoke with pt about following up with PCP regarding elevated blood pressure. Pt reports family hx of HTN. Educated pt on lifestyle changes that contribute to reducing b/p: smoking cessation, diet and exercise. Pt given informational handout on DASH diet. Pt appreciative and reports that he and girlfriend are both trying to quit smoking.

## 2017-07-01 NOTE — BH Assessment (Signed)
Referral information for Psychiatric Hospitalization faxed to;      [facility] ([phone]),      [facility] ([phone]),      [facility] ([phone]),     

## 2017-07-01 NOTE — ED Notes (Signed)
Patient using phone. Will continue to monitor with 15 minute checks.

## 2017-07-01 NOTE — BH Assessment (Signed)
Patient is to be admitted to [facility] by Dr. Joseph Art.  Attending Physician will be Dr. Ardyth Harps.   Patient has been assigned to room 316, by [facility] Charge Nurse Mechele Collin.   Intake Paper Work has been signed and placed on patient chart.  ER staff is aware of the admission [facility] Dr. Roxan Hockey, ER MD; Claris Che Patient's Nurse & Marylene Land Patient Access).

## 2017-07-01 NOTE — ED Notes (Signed)
Pt IVC and pending Psych admission.  (SOC completed.) 

## 2017-07-01 NOTE — ED Notes (Addendum)
Pt refused antidepressant and stated, "I don't want to take any medications." Pt is pleasant, but  depressed mood. Pt reports having no one to talk to since losing his best friend and aunt. Pt reports having been in a support group in the past ,but doesn't attend anymore due to having graduated. Pt expresses the desire to attend a support group again, reporting that he "doesn't have anyone that will listen to him." This nurse sat in his room, encouraged him to voice his feelings, listened and offered support. Pt denies SI at this time. Will continue to monitor with 15 minute checks

## 2017-07-01 NOTE — ED Notes (Signed)

## 2017-07-01 NOTE — ED Notes (Signed)
Patient talking on phone with niece

## 2017-07-01 NOTE — BH Assessment (Signed)
Writer followed up with referrals   [facility] (Laura-[phone]), Will have to call writer back. She wasn't at her computer.   [facility] ([phone]), Wait List. No adult male beds.   [facility] ([phone]), Was advised to call back to see if he was placed on wait list or denied. "Computer system down." However, they do not have any adult males at this time.

## 2017-07-01 NOTE — ED Notes (Signed)
Girlfriend here visiting patient

## 2017-07-01 NOTE — ED Notes (Signed)
Pt's brown wallet given to girlfriend to take home (per pt's request)

## 2017-07-01 NOTE — ED Notes (Signed)
Report was received from Danna Hefty., RN; Pt. Verbalizes  complaints of having Depression; and distress of having had several friends and family member die recently; and also relationship issues with girlfriend; verbalizes having S.I.; and denies having Hi. Continue to monitor with 15 min. Monitoring.

## 2017-07-01 NOTE — ED Notes (Signed)
Patient received PM snack. 

## 2017-07-01 NOTE — ED Notes (Signed)
Pt IVC and pending Psych admission.  (SOC completed.)

## 2017-07-01 NOTE — ED Provider Notes (Signed)
-----------------------------------------   1:10 AM on 07/01/2017 -----------------------------------------  Patient evaluated by [facility] psychiatrist Dr. Rob Bunting who recommends inpatient to psychiatry service, suicide precautions and citalopram 20 mg daily.   Irean Hong, MD 07/01/17 [phone]

## 2017-07-02 DIAGNOSIS — F332 Major depressive disorder, recurrent severe without psychotic features: Secondary | ICD-10-CM | POA: Diagnosis present

## 2017-07-02 DIAGNOSIS — Z720 Tobacco use: Secondary | ICD-10-CM

## 2017-07-02 DIAGNOSIS — F1220 Cannabis dependence, uncomplicated: Secondary | ICD-10-CM

## 2017-07-02 DIAGNOSIS — F17200 Nicotine dependence, unspecified, uncomplicated: Secondary | ICD-10-CM

## 2017-07-02 LAB — LIPID PANEL
CHOLESTEROL: 176 mg/dL (ref 0–200)
HDL: 31 mg/dL — ABNORMAL LOW (ref >40)
LDL Cholesterol: 105 mg/dL — ABNORMAL HIGH (ref 0–99)
Total CHOL/HDL Ratio: 5.7 RATIO
Triglycerides: 198 mg/dL — ABNORMAL HIGH (ref <150)
VLDL: 40 mg/dL (ref 0–40)

## 2017-07-02 LAB — TSH: TSH: 0.792 u[IU]/mL (ref 0.350–4.500)

## 2017-07-02 MED ORDER — MAGNESIUM HYDROXIDE 400 MG/5ML PO SUSP: 30.0000 mL | Freq: Every day | ORAL | Status: DC | PRN

## 2017-07-02 MED ORDER — ACETAMINOPHEN 325 MG PO TABS: 650.0000 mg | ORAL_TABLET | Freq: Four times a day (QID) | ORAL | Status: DC | PRN

## 2017-07-02 MED ORDER — HYDROXYZINE HCL 25 MG PO TABS
25.0000 mg | ORAL_TABLET | Freq: Three times a day (TID) | ORAL | Status: DC | PRN
  Filled 2017-07-02: qty 1

## 2017-07-02 MED ORDER — TRAZODONE HCL 50 MG PO TABS
50.0000 mg | ORAL_TABLET | Freq: Every evening | ORAL | Status: DC | PRN
  Filled 2017-07-02: qty 1

## 2017-07-02 MED ORDER — NAPROXEN 500 MG PO TABS
500.0000 mg | ORAL_TABLET | Freq: Two times a day (BID) | ORAL | Status: DC
  Administered 2017-07-02: 500 mg via ORAL
  Filled 2017-07-02 (×2): qty 1

## 2017-07-02 MED ORDER — PANTOPRAZOLE SODIUM 40 MG PO TBEC
40.0000 mg | DELAYED_RELEASE_TABLET | Freq: Two times a day (BID) | ORAL | Status: DC
  Administered 2017-07-02 – 2017-07-05 (×7): 40 mg via ORAL
  Filled 2017-07-02 (×8): qty 1

## 2017-07-02 MED ORDER — CITALOPRAM HYDROBROMIDE 20 MG PO TABS: 20.0000 mg | ORAL_TABLET | Freq: Every day | ORAL | Status: DC

## 2017-07-02 MED ORDER — ACETAMINOPHEN 500 MG PO TABS
1000.0000 mg | ORAL_TABLET | Freq: Four times a day (QID) | ORAL | Status: DC | PRN
  Administered 2017-07-04: 1000 mg via ORAL
  Filled 2017-07-02: qty 2

## 2017-07-02 MED ORDER — ALUM & MAG HYDROXIDE-SIMETH 200-200-20 MG/5ML PO SUSP: 30.0000 mL | ORAL | Status: DC | PRN

## 2017-07-02 NOTE — Progress Notes (Signed)
   07/02/17 1100  Clinical Encounter Type  Visited With Patient;Health care provider  Visit Type Initial;Spiritual support;Behavioral Health (Treatment team)   CH present during treatment team meeting with patient. CH will refer consult to male [facility]. Patient stated he needed someone to talk too about the loss and grief that he is currently struggling with in his head.

## 2017-07-02 NOTE — Tx Team (Signed)
Initial Treatment Plan 07/02/2017 1:29 AM [name] TDS:[id number]    PATIENT STRESSORS: Loss of close relationships due to recent deaths Marital or family conflict Traumatic event   PATIENT STRENGTHS: Ability for insight Capable of independent living Communication skills Motivation for treatment/growth   PATIENT IDENTIFIED PROBLEMS:                      DISCHARGE CRITERIA:  Adequate post-discharge living arrangements Motivation to continue treatment in a less acute level of care  PRELIMINARY DISCHARGE PLAN: Outpatient therapy Return to previous living arrangement Return to previous work or school arrangements  PATIENT/FAMILY INVOLVEMENT: This treatment plan has been presented to and reviewed with the patient, Roger Pennington.  The patient has been given the opportunity to ask questions and make suggestions.  Milon Score, RN 07/02/2017, 1:29 AM

## 2017-07-02 NOTE — BHH Suicide Risk Assessment (Signed)
[facility] Admission Suicide Risk Assessment   Nursing information obtained from:  Patient Demographic factors:  Male Current Mental Status:  Suicidal ideation indicated by patient Loss Factors:  Loss of significant relationship (multiple recent losses) Historical Factors:  Prior suicide attempts Risk Reduction Factors:  Sense of responsibility to family, Employed, Positive social support  Total Time spent with patient: 1 hour Principal Problem: Severe recurrent major depression (HCC) Diagnosis:   Patient Active Problem List   Diagnosis Date Noted  . 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 Tobacco use disorder [F17.200] 07/02/2017  . Cannabis use disorder, severe, dependence (HCC) [F12.20] 07/02/2017  . Upper GI bleed [K92.2] 02/08/2016   Subjective Data:   Continued Clinical Symptoms:  Alcohol Use Disorder Identification Test Final Score (AUDIT): 2 The "Alcohol Use Disorders Identification Test", Guidelines for Use in Primary Care, Second Edition.  World Science writer [employer]). Score between 0-7:  no or low risk or alcohol related problems. Score between 8-15:  moderate risk of alcohol related problems. Score between 16-19:  high risk of alcohol related problems. Score 20 or above:  warrants further diagnostic evaluation for alcohol dependence and treatment.   CLINICAL FACTORS:   Depression:   Impulsivity Alcohol/Substance Abuse/Dependencies More than one psychiatric diagnosis Previous Psychiatric Diagnoses and Treatments    Psychiatric Specialty Exam: Physical Exam  ROS  Blood pressure (!) 142/98, pulse 78, temperature 98 F (36.7 C), temperature source Oral, resp. rate 18, height 6' (1.829 m), weight 121.1 kg (267 lb), SpO2 100 %.Body mass index is 36.21 kg/m.                                                    Sleep:  Number of Hours: 5.15      COGNITIVE FEATURES THAT CONTRIBUTE TO RISK:  Polarized thinking    SUICIDE  RISK:   Moderate:  Frequent suicidal ideation with limited intensity, and duration, some specificity in terms of plans, no associated intent, good self-control, limited dysphoria/symptomatology, some risk factors present, and identifiable protective factors, including available and accessible social support.  PLAN OF CARE: admit  I certify that inpatient services furnished can reasonably be expected to improve the patient's condition.   Jimmy Footman, MD 07/02/2017, 5:48 PM

## 2017-07-02 NOTE — H&P (Addendum)
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 Tobacco use disorder [F17.200] 07/02/2017  . Cannabis use disorder, severe, dependence ([place]) [F12.20] 07/02/2017  . Upper GI bleed [K92.2] 02/08/2016   History of Present Illness:   41 year old separated African-American male who came to our emergency department under involuntary commitment on July 14 due to concerns with depression and suicidal ideation.  Patient reports that he has been feeling depressed for weeks. He has lost several friends and family members since June of this year. In addition his girlfriend of 5 years told him she was planning on moving back with her parents in [place] and she did not want him to come with her.  Patient says that he is deeply her by her girlfriend leaving him. They have been living together for the last 5 years. He felt that they were her family as he was also receiving her 2 daughters.  On July 14 he was returning from the funeral of one of his best friends. He got into an argument with his girlfriend. He decided after that to go to a parking lot cut his wrist and is staying in a hot car and die.  He contacted the suicide hotline and will eventually contacted the police.  Patient today said he is no longer suicidal. He still depressed but does not want to start medications for depression he prefers to see a therapist. He denies homicidal ideation or auditory or visual hallucinations.  He denies any symptoms consistent with mania, hypomania. There is no history of PTSD reported.  Substance abuse the patient smokes marijuana daily and has done that for years. Approximately he smokes up  blunt per day. He smokes one pack of cigarettes per day. He drinks alcohol occasionally a couple times a month.  Associated Signs/Symptoms: Depression Symptoms:  depressed mood, recurrent thoughts of death, suicidal thoughts with specific plan, (Hypo) Manic Symptoms:  Impulsivity, Anxiety Symptoms:  Excessive Worry, Psychotic Symptoms:  denies PTSD Symptoms: NA Total Time spent with patient: 1 hour  Past Psychiatric History: Patient was hospitalized in the unit more than 20 years ago after he attempted suicide by cutting his wrists. Patient has never received any psychiatric treatment since his discharge from our unit.  Is the patient at risk to self? Yes.    Has the patient been a risk to self in the past 6 months? No.  Has the patient been a risk to self within the distant past? Yes.    Is the patient a risk to others? No.  Has the patient been a risk to others in the past 6 months? No.  Has the patient been a risk to others within the distant past? No.    Alcohol Screening: Patient refused Alcohol Screening Tool: Yes 1. How often do you have a drink containing alcohol?: 2 to 4 times a month 2. How many drinks containing alcohol do you have on a typical day when you are drinking?: 1 or 2 3. How often do you have six or more drinks on one occasion?: Never Preliminary Score: 0 9. Have you or someone else been injured as a result of your drinking?: No 10. Has a relative or friend or a doctor or another health worker been concerned  about your drinking or suggested you cut down?: No Alcohol Use Disorder Identification Test Final Score (AUDIT): 2 Brief Intervention: Patient declined brief intervention   Past Medical History: History of gastric ulcers Past Medical History:  Diagnosis Date  . Tobacco abuse     Past Surgical History:  Procedure Laterality Date  . ESOPHAGOGASTRODUODENOSCOPY (EGD) WITH PROPOFOL N/A 02/09/2016   Procedure: ESOPHAGOGASTRODUODENOSCOPY (EGD) WITH  PROPOFOL;  Surgeon: Lollie Sails, MD;  Location: [facility] ENDOSCOPY;  Service: Endoscopy;  Laterality: N/A;   Family History:  Family History  Problem Relation Age of Onset  . Hypertension Father   . Hypertension Brother    Family Psychiatric  History: Significant history of alcoholism on his mother's side of the family  Tobacco Screening: Have you used any form of tobacco in the last 30 days? (Cigarettes, Smokeless Tobacco, Cigars, and/or Pipes): Yes Tobacco use, Select all that apply: 5 or more cigarettes per day Are you interested in Tobacco Cessation Medications?: No, patient refused Counseled patient on smoking cessation including recognizing danger situations, developing coping skills and basic information about quitting provided: Refused/Declined practical counseling   Social History: Patient used to sell marijuana for a living. He has had done this kind of thinking 3 years. He has been working in a [employer]. Lives with his girlfriend and HER-2 daughters. The patient has 3 children ages 39, 45 and 41.  He is close to the 61 and 7 year old daughter's. 68 year old daughter lives out of state.  No current legal charges. Patient has been incarcerated in the past  He dropped out of school in the 11 grade but obtained a GED History  Alcohol Use  . 0.0 oz/week    Comment: occ     History  Drug Use No     Allergies:  No Known Allergies   Lab Results:  Results for orders placed or performed during the hospital encounter of 07/01/17 (from the past 48 hour(s))  Lipid panel     Status: Abnormal   Collection Time: 07/02/17  7:00 AM  Result Value Ref Range   Cholesterol 176 0 - 200 mg/dL   Triglycerides 198 (H) <150 mg/dL   HDL 31 (L) >40 mg/dL   Total CHOL/HDL Ratio 5.7 RATIO   VLDL 40 0 - 40 mg/dL   LDL Cholesterol 105 (H) 0 - 99 mg/dL    Comment:        Total Cholesterol/HDL:CHD Risk Coronary Heart Disease Risk Table                     Men   Women  1/2  Average Risk   3.4   3.3  Average Risk       5.0   4.4  2 X Average Risk   9.6   7.1  3 X Average Risk  23.4   11.0        Use the calculated Patient Ratio above and the CHD Risk Table to determine the patient's CHD Risk.        ATP III CLASSIFICATION (LDL):  <100     mg/dL   Optimal  100-129  mg/dL   Near or Above                    Optimal  130-159  mg/dL   Borderline  160-189  mg/dL   High  >190     mg/dL   Very High   TSH     Status: None  Collection Time: 07/02/17  7:00 AM  Result Value Ref Range   TSH 0.792 0.350 - 4.500 uIU/mL    Comment: Performed by a 3rd Generation assay with a functional sensitivity of <=0.01 uIU/mL.    Blood Alcohol level:  Lab Results  Component Value Date   ETH <5 34/19/3790    Metabolic Disorder Labs:  No results found for: HGBA1C, MPG No results found for: PROLACTIN Lab Results  Component Value Date   CHOL 176 07/02/2017   TRIG 198 (H) 07/02/2017   HDL 31 (L) 07/02/2017   CHOLHDL 5.7 07/02/2017   VLDL 40 07/02/2017   LDLCALC 105 (H) 07/02/2017    Current Medications: Current Facility-Administered Medications  Medication Dose Route Frequency Provider Last Rate Last Dose  . acetaminophen (TYLENOL) tablet 1,000 mg  1,000 mg Oral Q6H PRN Hildred Priest, MD      . alum & mag hydroxide-simeth (MAALOX/MYLANTA) 200-200-20 MG/5ML suspension 30 mL  30 mL Oral Q4H PRN Lenward Chancellor, MD      . hydrOXYzine (ATARAX/VISTARIL) tablet 25 mg  25 mg Oral TID PRN Lenward Chancellor, MD      . magnesium hydroxide (MILK OF MAGNESIA) suspension 30 mL  30 mL Oral Daily PRN Lenward Chancellor, MD      . pantoprazole (PROTONIX) EC tablet 40 mg  40 mg Oral BID Lenward Chancellor, MD   40 mg at 07/02/17 1734  . traZODone (DESYREL) tablet 50 mg  50 mg Oral QHS PRN Lenward Chancellor, MD       PTA Medications: Prescriptions Prior to Admission  Medication Sig Dispense Refill Last Dose  . naproxen (NAPROSYN) 500 MG tablet Take 1 tablet (500 mg  total) by mouth 2 (two) times daily with a meal. 14 tablet 0   . pantoprazole (PROTONIX) 40 MG tablet Take 1 tablet (40 mg total) by mouth 2 (two) times daily. 60 tablet 0     Musculoskeletal: Strength & Muscle Tone: within normal limits Gait & Station: normal Patient leans: N/A  Psychiatric Specialty Exam: Physical Exam  Constitutional: He is oriented to person, place, and time. He appears well-developed and well-nourished.  HENT:  Head: Normocephalic and atraumatic.  Eyes: Conjunctivae and EOM are normal.  Neck: Normal range of motion.  Respiratory: Effort normal.  Musculoskeletal: Normal range of motion.  Neurological: He is alert and oriented to person, place, and time.    Review of Systems  Constitutional: Negative.   HENT: Negative.   Eyes: Negative.   Respiratory: Negative.   Cardiovascular: Negative.   Gastrointestinal: Negative.   Genitourinary: Negative.   Musculoskeletal: Negative.   Skin: Negative.   Neurological: Negative.   Endo/Heme/Allergies: Negative.   Psychiatric/Behavioral: Positive for depression. Negative for hallucinations, memory loss, substance abuse and suicidal ideas. The patient is not nervous/anxious and does not have insomnia.     Blood pressure (!) 142/98, pulse 78, temperature 98 F (36.7 C), temperature source Oral, resp. rate 18, height 6' (1.829 m), weight 121.1 kg (267 lb), SpO2 100 %.Body mass index is 36.21 kg/m.  General Appearance: Well Groomed  Eye Contact:  Good  Speech:  Clear and Coherent  Volume:  Normal  Mood:  Dysphoric  Affect:  Appropriate and Congruent  Thought Process:  Linear and Descriptions of Associations: Intact  Orientation:  Full (Time, Place, and Person)  Thought Content:  Hallucinations: None  Suicidal Thoughts:  No  Homicidal Thoughts:  No  Memory:  Immediate;   Good Recent;   Good Remote;   Good  Judgement:  Fair  Insight:  Fair  Psychomotor Activity:  Decreased  Concentration:  Concentration: Good and  Attention Span: Good  Recall:  Good  Fund of Knowledge:  Good  Language:  Good  Akathisia:  No  Handed:    AIMS (if indicated):     Assets:  Communication Skills Physical Health  ADL's:  Intact  Cognition:  WNL  Sleep:  Number of Hours: 5.15    Treatment Plan Summary: Daily contact with patient to assess and evaluate symptoms and progress in treatment and Medication management   Major depressive disorder patient is currently interested only in psychotherapy. He is afraid of trying an antidepressants with this caused antidepressants risk and benefits but sees still prefers only therapy  Insomnia: continue trazodone prn for insomnia  HTN: does not want to start tx with antihypertensives  Tobacco use: does not want nicotine patch  GI bleeding/ulcer: continue PPI bid.  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    Hildred Priest, MD 7/16/20185:49 PM

## 2017-07-02 NOTE — Progress Notes (Signed)
Patient arrived on the unit at 2310 accompanied by staff from [facility].  He was pleasant and cooperative during interview and states he is here to "get help because there have been too many people dying in my life and no one is listening".  Patient is admitted for recent suicidal ideation and plan of cutting his wrist while sitting in a hot car and "bleeding out" two days ago.  He reports feeling overwhelmed with recent losses: Aunt's death 1 month ago, friend died two weeks ago, and best friend killed himself last week.  Girl friend with whom he has lived with during the past four years just announced she was leaving to return to her family in [place], "without you".  Patient states he tried to approach the minister after the funeral service for his friend and was told to "contact me on Face Book".  While contemplating cutting his wrist, he dialed a "crisis number and was put on hold". He identifies wanting therapy "because I need to talk to someone who will listen".  He states he has never experience any family or friend's deaths before now and "i don't know how to handle this and my feelings".  He refused a consult for the hospital chaplain.  He would like to have outpatient therapy "for a while".  Patient was on this unit 20 years ago after cutting his left wrist "when I was a kid seeking attention".  He currently denies thoughts of self harm.  Patient contracts for safety. He denies SI/HI/AVH. Skin assessment done and no contraband was found.  He is oriented to the room and unit.  Safety is maintained.

## 2017-07-02 NOTE — Tx Team (Signed)
Interdisciplinary Treatment and Diagnostic Plan Update  07/02/2017 Time of Session: 10:30am  Roger Pennington MRN: [id number]  Principal Diagnosis: Severe recurrent major depression ([facility])  Secondary Diagnoses: Principal Problem:   Severe recurrent major depression ([facility]) Active Problems:   Upper GI bleed   Tobacco use disorder   Cannabis use disorder, severe, dependence ([facility])   Current Medications:  Current Facility-Administered Medications  Medication Dose Route Frequency Provider Last Rate Last Dose  . acetaminophen (TYLENOL) tablet 1,000 mg  1,000 mg Oral Q6H PRN Jimmy Footman, MD      . alum & mag hydroxide-simeth (MAALOX/MYLANTA) 200-200-20 MG/5ML suspension 30 mL  30 mL Oral Q4H PRN Beverly Sessions, MD      . hydrOXYzine (ATARAX/VISTARIL) tablet 25 mg  25 mg Oral TID PRN Beverly Sessions, MD      . magnesium hydroxide (MILK OF MAGNESIA) suspension 30 mL  30 mL Oral Daily PRN Beverly Sessions, MD      . pantoprazole (PROTONIX) EC tablet 40 mg  40 mg Oral BID Beverly Sessions, MD   40 mg at 07/02/17 3664  . traZODone (DESYREL) tablet 50 mg  50 mg Oral QHS PRN Beverly Sessions, MD       PTA Medications: Prescriptions Prior to Admission  Medication Sig Dispense Refill Last Dose  . naproxen (NAPROSYN) 500 MG tablet Take 1 tablet (500 mg total) by mouth 2 (two) times daily with a meal. 14 tablet 0   . pantoprazole (PROTONIX) 40 MG tablet Take 1 tablet (40 mg total) by mouth 2 (two) times daily. 60 tablet 0     Patient Stressors: Loss of close relationships due to recent deaths Marital or family conflict Traumatic event  Patient Strengths: Ability for insight Capable of independent living Communication skills Motivation for treatment/growth  Treatment Modalities: Medication Management, Group therapy, Case management,  1 to 1 session with clinician, Psychoeducation, Recreational therapy.   Physician Treatment Plan for Primary Diagnosis: Severe recurrent  major depression (HCC) Long Term Goal(s):     Short Term Goals:    Medication Management: Evaluate patient's response, side effects, and tolerance of medication regimen.  Therapeutic Interventions: 1 to 1 sessions, Unit Group sessions and Medication administration.  Evaluation of Outcomes: Progressing  Physician Treatment Plan for Secondary Diagnosis: Principal Problem:   Severe recurrent major depression (HCC) Active Problems:   Upper GI bleed   Tobacco use disorder   Cannabis use disorder, severe, dependence ([facility])  Long Term Goal(s):     Short Term Goals:       Medication Management: Evaluate patient's response, side effects, and tolerance of medication regimen.  Therapeutic Interventions: 1 to 1 sessions, Unit Group sessions and Medication administration.  Evaluation of Outcomes: Progressing   RN Treatment Plan for Primary Diagnosis: Severe recurrent major depression (HCC) Long Term Goal(s): Knowledge of disease and therapeutic regimen to maintain health will improve  Short Term Goals: Ability to demonstrate self-control, Ability to disclose and discuss suicidal ideas and Ability to identify and develop effective coping behaviors will improve  Medication Management: RN will administer medications as ordered by provider, will assess and evaluate patient's response and provide education to patient for prescribed medication. RN will report any adverse and/or side effects to prescribing provider.  Therapeutic Interventions: 1 on 1 counseling sessions, Psychoeducation, Medication administration, Evaluate responses to treatment, Monitor vital signs and CBGs as ordered, Perform/monitor CIWA, COWS, AIMS and Fall Risk screenings as ordered, Perform wound care treatments as ordered.  Evaluation of Outcomes: Progressing   LCSW Treatment  Plan for Primary Diagnosis: Severe recurrent major depression (HCC) Long Term Goal(s): Safe transition to appropriate next level of care at  discharge, Engage patient in therapeutic group addressing interpersonal concerns.  Short Term Goals: Engage patient in aftercare planning with referrals and resources, Increase social support, Facilitate acceptance of mental health diagnosis and concerns, Identify triggers associated with mental health/substance abuse issues and Increase skills for wellness and recovery  Therapeutic Interventions: Assess for all discharge needs, 1 to 1 time with Social worker, Explore available resources and support systems, Assess for adequacy in community support network, Educate family and significant other(s) on suicide prevention, Complete Psychosocial Assessment, Interpersonal group therapy.  Evaluation of Outcomes: Progressing   Progress in Treatment: Attending groups: Yes. Participating in groups: Yes. Taking medication as prescribed: Yes. Toleration medication: Yes. Family/Significant other contact made: No, will contact:    Patient understands diagnosis: Yes. Discussing patient identified problems/goals with staff: Yes. Medical problems stabilized or resolved: Yes. Denies suicidal/homicidal ideation: Yes. Issues/concerns per patient self-inventory: No. Other:    New problem(s) identified: No, Describe:     New Short Term/Long Term Goal(s):  Discharge Plan or Barriers: Discarge home with outpatient provider TBD  Reason for Continuation of Hospitalization: Depression Medication stabilization  Estimated Length of Stay:2-3 days  Attendees: Patient:[facility] 07/02/2017 3:11 PM  Physician: Radene Journey 07/02/2017 3:11 PM  Nursing: Leone Payor, RN 07/02/2017 3:11 PM  RN Care Manager: 07/02/2017 3:11 PM  Social Worker: Jake Shark, LCSW 07/02/2017 3:11 PM  Recreational Therapist:  07/02/2017 3:11 PM  Other:  07/02/2017 3:11 PM  Other:  07/02/2017 3:11 PM  Other: 07/02/2017 3:11 PM    Scribe for Treatment Team: Glennon Mac, LCSW 07/02/2017 3:11 PM

## 2017-07-02 NOTE — Progress Notes (Signed)
Pt refusing to take celexa as ordered, see MAR. Compliant with other medications ordered. Voices he is here due to depression, suicidal thoughts related to "I'm about to lose everything." Went on to explain that "my girlfriend is leaving me, shes moving back to [place]. I lost my aunt, my best friend recently. I mean, I work and there are people there, but there's no one to hang with after work." Pt states all of this with a submissive-like smile on his face. Encouraged patient to go to group to develop coping skills and utilize them when feeling suicidal/depressed. Support and encouragement provided. Safety maintained. Will continue to monitor.

## 2017-07-02 NOTE — [facility] Group Notes (Signed)
[facility] LCSW Group Therapy   07/02/2017 9:30am Type of Therapy: Group Therapy   Participation Level: Active   Participation Quality: Attentive, Sharing and Supportive   Affect: Appropriate   Cognitive: Alert and Oriented   Insight: Developing/Improving and Engaged   Engagement in Therapy: Developing/Improving and Engaged   Modes of Intervention: Clarification, Confrontation, Discussion, Education, Exploration,  Limit-setting, Orientation, Problem-solving, Rapport Building, Dance movement psychotherapist, Socialization and Support   Summary of Progress/Problems: Pt identified obstacles faced currently and processed barriers involved in overcoming these obstacles. Pt identified steps necessary for overcoming these obstacles and explored motivation (internal and external) for facing these difficulties head on. Pt further identified one area of concern in their lives and chose a goal to focus on for today. Pt identified emotions that led to hospitalization. He stated a change plan to regulate negative emotions. Pt also identified coping mechanisms to implement in order to reduce negative emotions. CSW provided support to patient.  Hampton Abbot, MSW, LCSW-A 07/02/2017, 10:54AM

## 2017-07-02 NOTE — Plan of Care (Signed)
Problem: Education: Goal: Emotional status will improve Outcome: Progressing Pt denies SI/HI/AVH, "I just need someone to talk to." Voices recent experiences with loss and voices plan to develop coping skills by attending group.

## 2017-07-02 NOTE — Progress Notes (Signed)
CH visited with Pt as a referral from [facility]. Pt was thought to be a potential suicidal person due to loss of close friends to death and because of girlfriend announcing breakup. CH prayed for Pt and spent talking to him about life and whether or not he loved himself and if he wanted to live. Pt lives in girlfriend's apartment and did not want to move back in with parents because of pride. Girlfriend said she is leaving the state in November and was giving him time to get his act together. She is the one who called the police thinking that he wanted to kill himself. Note: She told him about leaving the beginning of June 2018. CH did a follow up visit and the Pt was watching TV and indicated that he was good.

## 2017-07-02 NOTE — Plan of Care (Signed)
Problem: Activity: Goal: Interest or engagement in activities will improve Outcome: Progressing Pt is active in milieu, attends group, socializes appropriately with peers.

## 2017-07-03 NOTE — [facility] Counselor (Signed)
Adult Comprehensive Assessment  Patient ID: Roger Pennington, male   DOB: 01-20-1976, 41 y.o.   MRN: [id number]  Information Source: Information source: Patient  Current Stressors:  Bereavement / Loss: recent loss of 2 friends and one aunt.  Also has been told that girlfriend of 5 years is moving back to [place] in november and shes not asking him to mive with her.  Living/Environment/Situation:  Living Arrangements: Spouse/significant other, Children How long has patient lived in current situation?: 5 years What is atmosphere in current home: Comfortable, Supportive  Family History:  Marital status: Long term relationship Long term relationship, how long?: 10yrs What types of issues is patient dealing with in the relationship?: deepression causes isolation, poor communication Are you sexually active?: Yes What is your sexual orientation?: heterosexual Has your sexual activity been affected by drugs, alcohol, medication, or emotional stress?: no Does patient have children?: Yes (3) How many children?: 3 How is patient's relationship with their children?: good with 11yo and 25yo, has 16yo he has not seen since birth. because mom moved away, all girls   Childhood History:  By whom was/is the patient raised?: Both parents Description of patient's relationship with caregiver when they were a child: good Does patient have siblings?: Yes Number of Siblings: 1 Description of patient's current relationship with siblings: brother Did patient suffer any verbal/emotional/physical/sexual abuse as a child?: No Did patient suffer from severe childhood neglect?: No Has patient ever been sexually abused/assaulted/raped as an adolescent or adult?: No Was the patient ever a victim of a crime or a disaster?: No Witnessed domestic violence?: No Has patient been effected by domestic violence as an adult?: No  Education:  Currently a Consulting civil engineer?: No Learning disability?: No  Employment/Work Situation:    Employment situation: Employed 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 How long has patient been employed?: 56yrs Patient's job has been impacted by current illness: No Has patient ever been in the [employer]?: No Has patient ever served in combat?: No Did You Receive Any Psychiatric Treatment/Services While in Equities trader?: No Are There Guns or Other Weapons in Your Home?: No Are These Weapons Safely Secured?: No  Financial Resources:   Financial resources: Income from employment, Income from spouse Does patient have a Lawyer or guardian?: No  Alcohol/Substance Abuse:   What has been your use of drugs/alcohol within the last 12 months?: cannabis If attempted suicide, did drugs/alcohol play a role in this?: No Alcohol/Substance Abuse Treatment Hx: Denies past history Has alcohol/substance abuse ever caused legal problems?: No  Social Support System:   Forensic psychologist System: Poor Describe Community Support System: family and friends, problem is 3 major supports died and one is moving away in Nov Type of faith/religion: none How does patient's faith help to cope with current illness?: music, cooking  Leisure/Recreation:   Leisure and Hobbies: muic and cooking  Strengths/Needs:   What things does the patient do well?: his job, cooking In what areas does patient struggle / problems for patient: communicating how he feels  Discharge Plan:   Does patient have access to transportation?: Yes Will patient be returning to same living situation after discharge?: Yes Currently receiving community mental health services: No If no, would patient like referral for services when discharged?: Yes (What county?) (TBD based on in network providers, reccomend OPT counseling) Does patient have financial barriers related to discharge medications?: No (Pt not taking any medications)  Summary/Recommendations:   Summary and Recommendations (to  be completed by the evaluator): Pt is 41yo male admitted for worsening depression and thoughts of suicide with a plan  to cut his wrists secondary to 3 major losses withinnn the last month and anticipating the end of a long term relationship.  While on the unit Pt will have the opportunity to participate in groups and therapeutic milieu.  He will have medications managed and assistance with appropriate discharge planning.  It is reccomended that Pt follow up with all scheduled outptient appointments as directed at discharge.  Roger Daub Allahna Husband, LCSW. 07/03/2017

## 2017-07-03 NOTE — Progress Notes (Signed)
D: Pt denies SI/HI/AVH, affect is flat and sad, mood is irritable and guarded, patient did not want to talk to staff and sometimes ignored questions been asked . Pt is unwilling to participate in treatment plan.  Pt  he appears less anxious and he is interacting with peers  appropriately.  A: Pt was offered support and encouragement. Pt was offered PRN  medications. Pt was encouraged to attend groups. Q 15 minute checks were done for safety.  R:Pt attends groups and interacts well with peers. Pt no complaints.Pt is not receptive to treatment on the unit. Safety maintained on unit, will continue to monitor.

## 2017-07-03 NOTE — Progress Notes (Signed)
D: Pt denies SI/HI/AVH. Pt is pleasant and cooperative. Pt was avoidant and minimal with staff this evening. Pt was still a little depressed, but would not elaborate. Pt was observed interacting in the dayroom with peers.   A: Pt was offered support and encouragement. Pt was given scheduled medications. Pt was encourage to attend groups. Q 15 minute checks were done for safety.    R:Pt attends groups and interacts well with peers. Pt is taking prescribed  medication. Pt has no complaints at this time.Pt receptive to treatment and safety maintained on unit.

## 2017-07-03 NOTE — Plan of Care (Signed)
Problem: Potential for Harm to Self or Others Goal: Ability to verbalize feelings will improve by discharge Outcome: Progressing Open and verbal with thoughts/feelings/plans for discharge. Denies SI/HI/AVH. Support and encouragement provided.

## 2017-07-03 NOTE — Plan of Care (Signed)
Problem: Safety: Goal: Periods of time without injury will increase Outcome: Progressing Pt safe on the unit at this time   

## 2017-07-03 NOTE — [facility] Suicide Risk Assessment (Signed)
[facility] INPATIENT:  Family/Significant Other Suicide Prevention Education  Suicide Prevention Education:  Education Completed; Roger Pennington, Girlfriend, [phone],  (name of family member/significant other) has been identified by the patient as the family member/significant other with whom the patient will be residing, and identified as the person(s) who will aid the patient in the event of a mental health crisis (suicidal ideations/suicide attempt).  With written consent from the patient, the family member/significant other has been provided the following suicide prevention education, prior to the and/or following the discharge of the patient.  The suicide prevention education provided includes the following:  Suicide risk factors  Suicide prevention and interventions  National Suicide Hotline telephone number  [facility] assessment telephone number  [facility] Emergency Assistance 911  [facility] and/or Residential Mobile Crisis Unit telephone number  Request made of family/significant other to:  Remove weapons (e.g., guns, rifles, knives), all items previously/currently identified as safety concern.    Remove drugs/medications (over-the-counter, prescriptions, illicit drugs), all items previously/currently identified as a safety concern.  The family member/significant other verbalizes understanding of the suicide prevention education information provided.  The family member/significant other agrees to remove the items of safety concern listed above.  *Roger Pennington verbalizes support of Roger Pennington treatment plan and is supportive of follow up plans for a counselor.  She verbalizes being willing to take him or go with him, whatever is helpful to him.  She also agrees to pick him up at discharge and recognizes some specific ways she can be more supportive than she has been lately, specifically listening more clearly and not discounting his perspective on things.  Roger Pennington  Roger Mccuistion,  LCSW 07/03/2017, 3:08 PM

## 2017-07-03 NOTE — Plan of Care (Signed)
Problem: Coping: Goal: Ability to verbalize frustrations and anger appropriately will improve Outcome: Not Progressing Patient refused to verbalize feelings to staff during the shift.

## 2017-07-03 NOTE — [facility] Group Notes (Signed)
[facility] LCSW Group Therapy Note  Date/Time: 07/03/2017, 9:30 AM  Type of Therapy/Topic:  Group Therapy:  Feelings about Diagnosis  Participation Level:  Active   Mood: Pleasant and Appropriate    Description of Group:    This group will allow patients to explore their thoughts and feelings about diagnoses they have received. Patients will be guided to explore their level of understanding and acceptance of these diagnoses. Facilitator will encourage patients to process their thoughts and feelings about the reactions of others to their diagnosis, and will guide patients in identifying ways to discuss their diagnosis with significant others in their lives. This group will be process-oriented, with patients participating in exploration of their own experiences as well as giving and receiving support and challenge from other group members.   Therapeutic Goals: 1. Patient will demonstrate understanding of diagnosis as evidence by identifying two or more symptoms of the disorder:  2. Patient will be able to express two feelings regarding the diagnosis 3. Patient will demonstrate ability to communicate their needs through discussion and/or role plays  Summary of Patient Progress: Patient identified diagnosis as major depressive disorder. Patient has insight on what major depression is and what it looks like to live with diagnosis. Patient identified coping mechanisms to remain stable and despite triggers. CSW provided support to patient.     Therapeutic Modalities:   Cognitive Behavioral Therapy Brief Therapy Feelings Identification   Hampton Abbot, MSW, LCSW-A 07/03/2017, 10:30 AM

## 2017-07-03 NOTE — Progress Notes (Signed)
[facility] MD Progress Note  07/03/2017 9:05 AM Roger Pennington  MRN:  [id number] Subjective:  41 year old separated African-American male who came to our emergency department under involuntary commitment on July 14 due to concerns with depression and suicidal ideation.  Patient reports that he has been feeling depressed for weeks. He has lost several friends and family members since June of this year. In addition his girlfriend of 5 years told him she was planning on moving back with her parents in [place] and she did not want him to come with her.  Patient says that he is deeply her by her girlfriend leaving him. They have been living together for the last 5 years. He felt that they were her family as he was also receiving her 2 daughters.  On July 14 he was returning from the funeral of one of his best friends. He got into an argument with his girlfriend. He decided after that to go to a parking lot cut his wrist and is staying in a hot car and die.  He contacted the suicide hotline and will eventually contacted the police.  Patient today said he is no longer suicidal. He still depressed but does not want to start medications for depression he prefers to see a therapist. He denies homicidal ideation or auditory or visual hallucinations.  He denies any symptoms consistent with mania, hypomania. There is no history of PTSD reported.  Substance abuse the patient smokes marijuana daily and has done that for years. Approximately he smokes up blunt per day. He smokes one pack of cigarettes per day. He drinks alcohol occasionally a couple times a month.  7/17 patient reports feeling much better. No longer having thoughts of suicide or passive suicidal ideation. He is feels more positive and more hopeful. He feels that groups haven't really helped him. He says that talking to people has been very beneficial. He denies problems with mood, appetite, energy, sleep or concentration. Denies suicidality,  homicidality or auditory or visual hallucinations. Patient is currently not taking any medications and not interested in starting any.   Per nursing: D: Pt denies SI/HI/AVH, affect is flat and sad, mood is irritable and guarded, patient did not want to talk to staff and sometimes ignored questions been asked . Pt is unwilling to participate in treatment plan.  Pt  he appears less anxious and he is interacting with peers  appropriately.  A: Pt was offered support and encouragement. Pt was offered PRN  medications. Pt was encouraged to attend groups. Q 15 minute checks were done for safety.  R:Pt attends groups and interacts well with peers. Pt no complaints.Pt is not receptive to treatment on the unit. Safety maintained on unit, will continue to monitor.   Principal Problem: Severe recurrent major depression ([place]) Diagnosis:   Patient Active Problem List   Diagnosis Date Noted  . 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 Tobacco use disorder [F17.200] 07/02/2017  . Cannabis use disorder, severe, dependence ([place]) [F12.20] 07/02/2017  . Upper GI bleed [K92.2] 02/08/2016   Total Time spent with patient: 30 minutes  Past Psychiatric History: Patient was hospitalized in the unit more than 20 years ago after he attempted suicide by cutting his wrists. Patient has never received any psychiatric treatment since his discharge from our unit.   Past Medical History:  Past Medical History:  Diagnosis Date  . Tobacco abuse     Past Surgical History:  Procedure Laterality Date  . ESOPHAGOGASTRODUODENOSCOPY (EGD) WITH PROPOFOL  N/A 02/09/2016   Procedure: ESOPHAGOGASTRODUODENOSCOPY (EGD) WITH PROPOFOL;  Surgeon: Lollie Sails, MD;  Location: [facility] ENDOSCOPY;  Service: Endoscopy;  Laterality: N/A;   Family History:  Family History  Problem Relation Age of Onset  . Hypertension Father   . Hypertension Brother    Family Psychiatric  History: Significant history of alcoholism  on his mother's side of the family  Social History: Patient used to sell marijuana for a living. He has had done this kind of thinking 3 years. He has been working in a [employer]. Lives with his girlfriend and HER-2 daughters. The patient has 3 children ages 76, 95 and 31.  He is close to the 11 and 32 year old daughter's. 53 year old daughter lives out of state.  No current legal charges. Patient has been incarcerated in the past  He dropped out of school in the 11 grade but obtained a GED History  Alcohol Use  . 0.0 oz/week    Comment: occ     History  Drug Use No    Social History   Social History  . Marital status: Single    Spouse name: N/A  . Number of children: N/A  . Years of education: N/A   Social History Main Topics  . Smoking status: Current Every Day Smoker  . Smokeless tobacco: Never Used  . Alcohol use 0.0 oz/week     Comment: occ  . Drug use: No  . Sexual activity: Not Asked   Other Topics Concern  . None   Social History Narrative  . None     Current Medications: Current Facility-Administered Medications  Medication Dose Route Frequency Provider Last Rate Last Dose  . acetaminophen (TYLENOL) tablet 1,000 mg  1,000 mg Oral Q6H PRN Hildred Priest, MD      . alum & mag hydroxide-simeth (MAALOX/MYLANTA) 200-200-20 MG/5ML suspension 30 mL  30 mL Oral Q4H PRN Lenward Chancellor, MD      . hydrOXYzine (ATARAX/VISTARIL) tablet 25 mg  25 mg Oral TID PRN Lenward Chancellor, MD      . magnesium hydroxide (MILK OF MAGNESIA) suspension 30 mL  30 mL Oral Daily PRN Lenward Chancellor, MD      . pantoprazole (PROTONIX) EC tablet 40 mg  40 mg Oral BID Lenward Chancellor, MD   40 mg at 07/03/17 0844  . traZODone (DESYREL) tablet 50 mg  50 mg Oral QHS PRN Lenward Chancellor, MD        Lab Results:  Results for orders placed or performed during the hospital encounter of 07/01/17 (from the past 48 hour(s))  Lipid panel     Status: Abnormal    Collection Time: 07/02/17  7:00 AM  Result Value Ref Range   Cholesterol 176 0 - 200 mg/dL   Triglycerides 198 (H) <150 mg/dL   HDL 31 (L) >40 mg/dL   Total CHOL/HDL Ratio 5.7 RATIO   VLDL 40 0 - 40 mg/dL   LDL Cholesterol 105 (H) 0 - 99 mg/dL    Comment:        Total Cholesterol/HDL:CHD Risk Coronary Heart Disease Risk Table                     Men   Women  1/2 Average Risk   3.4   3.3  Average Risk       5.0   4.4  2 X Average Risk   9.6   7.1  3 X Average Risk  23.4   11.0  Use the calculated Patient Ratio above and the CHD Risk Table to determine the patient's CHD Risk.        ATP III CLASSIFICATION (LDL):  <100     mg/dL   Optimal  100-129  mg/dL   Near or Above                    Optimal  130-159  mg/dL   Borderline  160-189  mg/dL   High  >190     mg/dL   Very High   TSH     Status: None   Collection Time: 07/02/17  7:00 AM  Result Value Ref Range   TSH 0.792 0.350 - 4.500 uIU/mL    Comment: Performed by a 3rd Generation assay with a functional sensitivity of <=0.01 uIU/mL.    Blood Alcohol level:  Lab Results  Component Value Date   ETH <5 63/12/6008    Metabolic Disorder Labs: No results found for: HGBA1C, MPG No results found for: PROLACTIN Lab Results  Component Value Date   CHOL 176 07/02/2017   TRIG 198 (H) 07/02/2017   HDL 31 (L) 07/02/2017   CHOLHDL 5.7 07/02/2017   VLDL 40 07/02/2017   LDLCALC 105 (H) 07/02/2017    Physical Findings: AIMS:  , ,  ,  ,    CIWA:    COWS:     Musculoskeletal: Strength & Muscle Tone: within normal limits Gait & Station: normal Patient leans: N/A  Psychiatric Specialty Exam: Physical Exam  Constitutional: He is oriented to person, place, and time. He appears well-developed and well-nourished.  HENT:  Head: Normocephalic and atraumatic.  Eyes: Conjunctivae and EOM are normal.  Neck: Normal range of motion.  Respiratory: Effort normal.  Musculoskeletal: Normal range of motion.  Neurological: He  is alert and oriented to person, place, and time.    Review of Systems  Constitutional: Negative.   HENT: Negative.   Eyes: Negative.   Respiratory: Negative.   Cardiovascular: Negative.   Gastrointestinal: Negative.   Genitourinary: Negative.   Musculoskeletal: Negative.   Skin: Negative.   Neurological: Negative.   Endo/Heme/Allergies: Negative.   Psychiatric/Behavioral: Negative.     Blood pressure 139/90, pulse 65, temperature 97.9 F (36.6 C), resp. rate 18, height 6' (1.829 m), weight 121.1 kg (267 lb), SpO2 100 %.Body mass index is 36.21 kg/m.  General Appearance: Well Groomed  Eye Contact:  Good  Speech:  Clear and Coherent  Volume:  Normal  Mood:  Dysphoric  Affect:  Appropriate and Congruent  Thought Process:  Linear and Descriptions of Associations: Intact  Orientation:  Full (Time, Place, and Person)  Thought Content:  Hallucinations: None  Suicidal Thoughts:  No  Homicidal Thoughts:  No  Memory:  Immediate;   Good Recent;   Good Remote;   Good  Judgement:  Fair  Insight:  Fair  Psychomotor Activity:  Decreased  Concentration:  Concentration: Good and Attention Span: Good  Recall:  Good  Fund of Knowledge:  Good  Language:  Good  Akathisia:  No  Handed:    AIMS (if indicated):     Assets:  Communication Skills Physical Health  ADL's:  Intact  Cognition:  WNL  Sleep:  Number of Hours: 6     Treatment Plan Summary: Daily contact with patient to assess and evaluate symptoms and progress in treatment and Medication management     Major depressive disorder patient is currently interested only in psychotherapy. He is afraid of trying an antidepressants  with this caused antidepressants risk and benefits but sees still prefers only therapy  Insomnia: continue trazodone prn for insomnia  HTN: does not want to start tx with antihypertensives  Tobacco use: does not want nicotine patch  GI bleeding/ulcer: continue PPI bid.  Will d/c all  NSAIDs  Diet low sodium  Precations q 15 m checks  LOS: aprox 3-5 days  F/u [facility]  Dispo: back home  Labs: TSH wnl  Improving. No changes in medications today. Plan to discharge in the next 48 hours.  Hildred Priest, MD 07/03/2017, 9:05 AM

## 2017-07-03 NOTE — Progress Notes (Signed)
Pleasant and cooperative on unit. Bright and smiling, but appears to be sad sometimes. Denies SI/HI/AVH, pain. Compliant with medication ordered, but refuses to take medication for anxiety/depression. Attends group and voices utilizing coping skills on discharge. Reports he has an appointment with a preacher in [place] on discharge. Support and encouragement provided. Medications administered as ordered with education. Safety maintained with every 15 minute checks. Will continue to monitor.

## 2017-07-03 NOTE — [facility] Group Notes (Signed)
[facility] Group Notes:  (Nursing/MHT/Case Management/Adjunct)  Date:  07/03/2017  Time:  10:41 PM  Type of Therapy:  Group Therapy  Participation Level:  Active  Participation Quality:  Appropriate  Affect:  Appropriate  Cognitive:  Appropriate  Insight:  Appropriate  Engagement in Group:  Engaged  Modes of Intervention:  Discussion  Summary of Progress/Problems:  Roger Pennington 07/03/2017, 10:41 PM

## 2017-07-03 NOTE — [facility] Group Notes (Signed)
[facility] Group Notes:  (Nursing/MHT/Case Management/Adjunct)  Date:  07/03/2017  Time:  2:39 PM  Type of Therapy:  Psychoeducational Skills  Participation Level:  Did Not Attend    Roger Pennington 07/03/2017, 2:39 PM

## 2017-07-04 MED ORDER — PANTOPRAZOLE SODIUM 40 MG PO TBEC: 40.0000 mg | DELAYED_RELEASE_TABLET | Freq: Two times a day (BID) | ORAL | 0 refills | Status: DC

## 2017-07-04 MED ORDER — AMLODIPINE BESYLATE 2.5 MG PO TABS: 2.5000 mg | ORAL_TABLET | Freq: Every day | ORAL | 0 refills | Status: DC

## 2017-07-04 MED ORDER — AMLODIPINE BESYLATE 5 MG PO TABS
2.5000 mg | ORAL_TABLET | Freq: Every day | ORAL | Status: DC
  Administered 2017-07-04 – 2017-07-05 (×2): 2.5 mg via ORAL
  Filled 2017-07-04 (×3): qty 1

## 2017-07-04 NOTE — Progress Notes (Signed)
[facility] MD Progress Note  07/04/2017 8:48 AM Roger Pennington  MRN:  [id number] Subjective:  41 year old separated African-American male who came to our emergency department under involuntary commitment on July 14 due to concerns with depression and suicidal ideation.  Patient reports that he has been feeling depressed for weeks. He has lost several friends and family members since June of this year. In addition his girlfriend of 5 years told him she was planning on moving back with her parents in [place] and she did not want him to come with her.  Patient says that he is deeply her by her girlfriend leaving him. They have been living together for the last 5 years. He felt that they were her family as he was also receiving her 2 daughters.  On July 14 he was returning from the funeral of one of his best friends. He got into an argument with his girlfriend. He decided after that to go to a parking lot cut his wrist and is staying in a hot car and die.  He contacted the suicide hotline and will eventually contacted the police.  Patient today said he is no longer suicidal. He still depressed but does not want to start medications for depression he prefers to see a therapist. He denies homicidal ideation or auditory or visual hallucinations.  He denies any symptoms consistent with mania, hypomania. There is no history of PTSD reported.  Substance abuse the patient smokes marijuana daily and has done that for years. Approximately he smokes up blunt per day. He smokes one pack of cigarettes per day. He drinks alcohol occasionally a couple times a month.  7/17 patient reports feeling much better. No longer having thoughts of suicide or passive suicidal ideation. He is feels more positive and more hopeful. He feels that groups haven't really helped him. He says that talking to people has been very beneficial. He denies problems with mood, appetite, energy, sleep or concentration. Denies suicidality,  homicidality or auditory or visual hallucinations. Patient is currently not taking any medications and not interested in starting any.  7/18 patient doing much better. He denies major problems with sleep, appetite, energy or concentration. He is more hopeful and future oriented. His depression is not as severe. He denies having thoughts of suicide. He denies homicidality or auditory or visual hallucinations. Still not open to take any antidepressants. Patient has been attending groups and enjoys them   Per nursing: D: Pt denies SI/HI/AVH. Pt is pleasant and cooperative. Pt was avoidant and minimal with staff this evening. Pt was still a little depressed, but would not elaborate. Pt was observed interacting in the dayroom with peers.   A: Pt was offered support and encouragement. Pt was given scheduled medications. Pt was encourage to attend groups. Q 15 minute checks were done for safety.    R:Pt attends groups and interacts well with peers. Pt is taking prescribed  medication. Pt has no complaints at this time.Pt receptive to treatment and safety maintained on unit.  Principal Problem: Severe recurrent major depression ([place]) Diagnosis:   Patient Active Problem List   Diagnosis Date Noted  . Severe recurrent major depression ([place]) [F33.2] 07/02/2017  . Tobacco use disorder [F17.200] 07/02/2017  . Cannabis use disorder, severe, dependence ([place]) [F12.20] 07/02/2017  . Upper GI bleed [K92.2] 02/08/2016   Total Time spent with patient: 30 minutes  Past Psychiatric History: Patient was hospitalized in the unit more than 20 years ago after he attempted suicide by cutting his wrists.  Patient has never received any psychiatric treatment since his discharge from our unit.   Past Medical History:  Past Medical History:  Diagnosis Date  . Tobacco abuse     Past Surgical History:  Procedure Laterality Date  . ESOPHAGOGASTRODUODENOSCOPY (EGD) WITH PROPOFOL N/A 02/09/2016   Procedure:  ESOPHAGOGASTRODUODENOSCOPY (EGD) WITH PROPOFOL;  Surgeon: Lollie Sails, MD;  Location: [facility] END[facility];  Service: Endoscopy;  Laterality: N/A;   Family History:  Family History  Problem Relation Age of Onset  . Hypertension Father   . Hypertension Brother    Family Psychiatric  History: Significant history of alcoholism on his mother's side of the family  Social History: Patient used to sell marijuana for a living. He has had done this kind of thinking 3 years. He has been working in a [employer]. Lives with his girlfriend and HER-2 daughters. The patient has 3 children ages 16, 53 and 36.  He is close to the 54 and 68 year old daughter's. 70 year old daughter lives out of state.  No current legal charges. Patient has been incarcerated in the past  He dropped out of school in the 11 grade but obtained a GED History  Alcohol Use  . 0.0 oz/week    Comment: occ     History  Drug Use No    Social History   Social History  . Marital status: Single    Spouse name: N/A  . Number of children: N/A  . Years of education: N/A   Social History Main Topics  . Smoking status: Current Every Day Smoker  . Smokeless tobacco: Never Used  . Alcohol use 0.0 oz/week     Comment: occ  . Drug use: No  . Sexual activity: Not Asked   Other Topics Concern  . None   Social History Narrative  . None     Current Medications: Current Facility-Administered Medications  Medication Dose Route Frequency Provider Last Rate Last Dose  . acetaminophen (TYLENOL) tablet 1,000 mg  1,000 mg Oral Q6H PRN Hildred Priest, MD      . alum & mag hydroxide-simeth (MAALOX/MYLANTA) 200-200-20 MG/5ML suspension 30 mL  30 mL Oral Q4H PRN Lenward Chancellor, MD      . hydrOXYzine (ATARAX/VISTARIL) tablet 25 mg  25 mg Oral TID PRN Lenward Chancellor, MD      . magnesium hydroxide (MILK OF MAGNESIA) suspension 30 mL  30 mL Oral Daily PRN Lenward Chancellor, MD      . pantoprazole (PROTONIX)  EC tablet 40 mg  40 mg Oral BID Lenward Chancellor, MD   40 mg at 07/04/17 0813  . traZODone (DESYREL) tablet 50 mg  50 mg Oral QHS PRN Lenward Chancellor, MD        Lab Results:  No results found for this or any previous visit (from the past 48 hour(s)).  Blood Alcohol level:  Lab Results  Component Value Date   ETH <5 63/87/5643    Metabolic Disorder Labs: No results found for: HGBA1C, MPG No results found for: PROLACTIN Lab Results  Component Value Date   CHOL 176 07/02/2017   TRIG 198 (H) 07/02/2017   HDL 31 (L) 07/02/2017   CHOLHDL 5.7 07/02/2017   VLDL 40 07/02/2017   LDLCALC 105 (H) 07/02/2017    Physical Findings: AIMS:  , ,  ,  ,    CIWA:    COWS:     Musculoskeletal: Strength & Muscle Tone: within normal limits Gait & Station: normal Patient leans: N/A  Psychiatric Specialty Exam:  Physical Exam  Constitutional: He is oriented to person, place, and time. He appears well-developed and well-nourished.  HENT:  Head: Normocephalic and atraumatic.  Eyes: Conjunctivae and EOM are normal.  Neck: Normal range of motion.  Respiratory: Effort normal.  Musculoskeletal: Normal range of motion.  Neurological: He is alert and oriented to person, place, and time.    Review of Systems  Constitutional: Negative.   HENT: Negative.   Eyes: Negative.   Respiratory: Negative.   Cardiovascular: Negative.   Gastrointestinal: Negative.   Genitourinary: Negative.   Musculoskeletal: Negative.   Skin: Negative.   Neurological: Negative.   Endo/Heme/Allergies: Negative.   Psychiatric/Behavioral: Negative.     Blood pressure 139/90, pulse 65, temperature 97.9 F (36.6 C), resp. rate 18, height 6' (1.829 m), weight 121.1 kg (267 lb), SpO2 100 %.Body mass index is 36.21 kg/m.  General Appearance: Well Groomed  Eye Contact:  Good  Speech:  Clear and Coherent  Volume:  Normal  Mood:  Dysphoric  Affect:  Appropriate and Congruent  Thought Process:  Linear and Descriptions  of Associations: Intact  Orientation:  Full (Time, Place, and Person)  Thought Content:  Hallucinations: None  Suicidal Thoughts:  No  Homicidal Thoughts:  No  Memory:  Immediate;   Good Recent;   Good Remote;   Good  Judgement:  Fair  Insight:  Fair  Psychomotor Activity:  Decreased  Concentration:  Concentration: Good and Attention Span: Good  Recall:  Good  Fund of Knowledge:  Good  Language:  Good  Akathisia:  No  Handed:    AIMS (if indicated):     Assets:  Communication Skills Physical Health  ADL's:  Intact  Cognition:  WNL  Sleep:  Number of Hours: 6.15     Treatment Plan Summary: Daily contact with patient to assess and evaluate symptoms and progress in treatment and Medication management     Major depressive disorder patient is currently interested only in psychotherapy. He is afraid of trying an antidepressants with this caused antidepressants risk and benefits but sees still prefers only therapy  Insomnia: continue trazodone prn for insomnia  HTN: Reluctant to start antihypertensive for today he agreed to low-dose Norvasc  Tobacco use: does not want nicotine patch  GI bleeding/ulcer: continue PPI bid.  Will d/c all NSAIDs  Diet low sodium  Precations q 15 m checks  LOS: aprox 3-5 days.  Likely will be d/c home tomorrow  F/u [facility]  Dispo: back home  Labs: TSH wnl  Improving. No changes in medications today. Plan to discharge in the next 24 h  Hildred Priest, MD 07/04/2017, 8:48 AM

## 2017-07-04 NOTE — Progress Notes (Signed)
Patient up ad lib with steady gait, A&O x 4. Affect sad and depressed, Would not elaborate on the cause of the mood. Interaction with select peers noted. Up for meals and groups, compliant with medications. Denies any SI/HI. Milieu remains safe with q 15 minute safety checks.

## 2017-07-04 NOTE — [facility] Group Notes (Signed)
[facility] LCSW Group Therapy   07/04/2017 9:30 am Type of Therapy: Group Therapy   Participation Level: Pt invited but did not attend.    Hampton Abbot, MSW, LCSW-A 07/04/2017, 10:33AM

## 2017-07-04 NOTE — Progress Notes (Signed)
   07/04/17 1400  Clinical Encounter Type  Visited With Patient  Visit Type Spiritual support;Behavioral Health  Referral From Patient   Patient attended emotional support group led by [facility].

## 2017-07-04 NOTE — Plan of Care (Signed)
Problem: Activity: Goal: Interest or engagement in activities will improve Outcome: Progressing Patient attends group activities and engages appropriately with peers and staff.  Problem: Education: Goal: Emotional status will improve Outcome: Not Met (add Reason) Patient continues to admit to being depressed. Goal: Verbalization of understanding the information provided will improve Outcome: Progressing Patient admits to understanding the information provided to him.  Problem: Coping: Goal: Ability to verbalize frustrations and anger appropriately will improve Outcome: Progressing Patient has the ability to verbalize feelings without displaying any anger. Goal: Ability to demonstrate self-control will improve Outcome: Progressing Patient able to demonstrate self-control, no outbursts noted.

## 2017-07-04 NOTE — Progress Notes (Signed)
  [facility] Adult Case Management Discharge Plan :  Will you be returning to the same living situation after discharge:  Yes,    At discharge, do you have transportation home?: Yes,    Do you have the ability to pay for your medications: Yes,   not taking any meds at this time  Release of information consent forms completed and in the chart;  Patient's signature needed at discharge.  Patient to Follow up at: Follow-up Information    [facility] Go on 07/09/2017.   Why:  7am.  For hospital Follow up and Peer Support Services.  Please keep in touch with Peer support Specialist Unk Pinto if you have any questions about establishing services [phone] Contact information: [address] [place] [address] [phone]           Next level of care provider has access to [facility] Link:no  Safety Planning and Suicide Prevention discussed: Yes,     Have you used any form of tobacco in the last 30 days? (Cigarettes, Smokeless Tobacco, Cigars, and/or Pipes): Yes  Has patient been referred to the Quitline?: Patient refused referral  Patient has been referred for addiction treatment: Yes  Glennon Mac, LCSW 07/04/2017, 5:48 PM

## 2017-07-05 MED ORDER — AMLODIPINE BESYLATE 5 MG PO TABS
2.5000 mg | ORAL_TABLET | Freq: Once | ORAL | Status: AC
  Administered 2017-07-05: 2.5 mg via ORAL
  Filled 2017-07-05: qty 1

## 2017-07-05 MED ORDER — AMLODIPINE BESYLATE 5 MG PO TABS: 5.0000 mg | ORAL_TABLET | Freq: Once | ORAL | 0 refills | Status: DC

## 2017-07-05 NOTE — [facility] Group Notes (Signed)
Goals Group  Date/Time: 07/05/2017, 9:00 AM Type of Therapy and Topic: Group Therapy: Goals Group: SMART Goals  ?  Participation Level: Moderate  ?  Description of Group:  ?  The purpose of a daily goals group is to assist and guide patients in setting recovery/wellness-related goals. The objective is to set goals as they relate to the crisis in which they were admitted. Patients will be using SMART goal modalities to set measurable goals. Characteristics of realistic goals will be discussed and patients will be assisted in setting and processing how one will reach their goal. Facilitator will also assist patients in applying interventions and coping skills learned in psycho-education groups to the SMART goal and process how one will achieve defined goal.  ?  Therapeutic Goals:  ?  -Patients will develop and document one goal related to or their crisis in which brought them into treatment.  -Patients will be guided by LCSW using SMART goal setting modality in how to set a measurable, attainable, realistic and time sensitive goal.  -Patients will process barriers in reaching goal.  -Patients will process interventions in how to overcome and successful in reaching goal.  ?  Patient's Goal: Pt stated goal is to "follow-up with recommended providers for psychiatric treatment." ?  Therapeutic Modalities:  Motivational Interviewing  Cognitive Behavioral Therapy  Crisis Intervention Model  SMART goals setting  Hampton Abbot, MSW, LCSW-A 07/05/2017, 10:27AM

## 2017-07-05 NOTE — Progress Notes (Signed)
Pt denies SI, HI, a/v hallucinations. Pt commits to safety for discharge. Follow up appointments, discharge medications, and treatment reviewed. Pt verbalized understanding of discharge instructions. All belongings returned to pt upon discharge. Pt discharged safely walked out with girlfriend to visitors parking lot.

## 2017-07-05 NOTE — Plan of Care (Signed)
Problem: Safety: Goal: Periods of time without injury will increase Outcome: Progressing Pt remains safe while in hospital injury free.    

## 2017-07-05 NOTE — [facility] Group Notes (Signed)
[facility] LCSW Group Therapy   07/05/2017 9:30 am   Type of Therapy: Group Therapy   Participation Level: Active   Participation Quality: Attentive, Sharing and Supportive   Affect: Appropriate   Cognitive: Alert and Oriented   Insight: Developing/Improving and Engaged   Engagement in Therapy: Developing/Improving and Engaged   Modes of Intervention: Clarification, Confrontation, Discussion, Education, Exploration, Limit-setting, Orientation, Problem-solving, Rapport Building, Dance movement psychotherapist, Socialization and Support   Summary of Progress/Problems: The topic for group was balance in life. Today's group focused on defining balance in one's own words, identifying things that can knock one off balance, and exploring healthy ways to maintain balance in life. Group members were asked to provide an example of a time when they felt off balance, describe how they handled that situation, and process healthier ways to regain balance in the future. Group members were asked to share the most important tool for maintaining balance that they learned while at [facility] and how they plan to apply this method after discharge. Pt defined what the word "balance" means to him. Pt was engaged throughout entire group and identified two out of balance areas such as work and finances. Pt also identified ways to work on out of balance areas in order to maintain balance.   Hampton Abbot, MSW, LCSW-A 07/05/2017, 10:35AM

## 2017-07-05 NOTE — Discharge Summary (Addendum)
Physician Discharge Summary Note  Patient:  Roger Pennington is an 41 y.o., male MRN:  [id number] DOB:  04-16-1976 Patient phone:  [phone] (home)  Patient address:   [address] [place] [address],  Total Time spent with patient: 30 minutes  Date of Admission:  07/01/2017 Date of Discharge: 07/05/17  Reason for Admission:  SI  Principal Problem: Severe recurrent major depression [facility]) Discharge Diagnoses: Patient Active Problem List   Diagnosis Date Noted  . Severe recurrent major depression ([place]) [F33.2] 07/02/2017  . Tobacco use disorder [F17.200] 07/02/2017  . Cannabis use disorder, severe, dependence ([place]) [F12.20] 07/02/2017  . Upper GI bleed [K92.2] 02/08/2016    History of Present Illness:   41 year old separated African-American male who came to our emergency department under involuntary commitment on July 14 due to concerns with depression and suicidal ideation.  Patient reports that he has been feeling depressed for weeks. He has lost several friends and family members since June of this year. In addition his girlfriend of 5 years told him she was planning on moving back with her parents in [place] and she did not want him to come with her.  Patient says that he is deeply her by her girlfriend leaving him. They have been living together for the last 5 years. He felt that they were her family as he was also receiving her 2 daughters.  On July 14 he was returning from the funeral of one of his best friends. He got into an argument with his girlfriend. He decided after that to go to a parking lot cut his wrist and is staying in a hot car and die.  He contacted the suicide hotline and will eventually contacted the police.  Patient today said he is no longer suicidal. He still depressed but does not want to start medications for depression he prefers to see a therapist. He denies homicidal ideation or auditory or visual hallucinations.  He denies any  symptoms consistent with mania, hypomania. There is no history of PTSD reported.  Substance abuse the patient smokes marijuana daily and has done that for years. Approximately he smokes up blunt per day. He smokes one pack of cigarettes per day. He drinks alcohol occasionally a couple times a month.  Associated Signs/Symptoms: Depression Symptoms:  depressed mood, recurrent thoughts of death, suicidal thoughts with specific plan, (Hypo) Manic Symptoms:  Impulsivity, Anxiety Symptoms:  Excessive Worry, Psychotic Symptoms:  denies PTSD Symptoms: NA Total Time spent with patient: 1 hour  Past Psychiatric History: Patient was hospitalized in the unit more than 20 years ago after he attempted suicide by cutting his wrists. Patient has never received any psychiatric treatment since his discharge from our unit.  Past Medical History:  Past Medical History:  Diagnosis Date  . Tobacco abuse     Past Surgical History:  Procedure Laterality Date  . ESOPHAGOGASTRODUODENOSCOPY (EGD) WITH PROPOFOL N/A 02/09/2016   Procedure: ESOPHAGOGASTRODUODENOSCOPY (EGD) WITH PROPOFOL;  Surgeon: Lollie Sails, MD;  Location: [facility] ENDOSCOPY;  Service: Endoscopy;  Laterality: N/A;   Family History:  Family History  Problem Relation Age of Onset  . Hypertension Father   . Hypertension Brother    Family Psychiatric  History: Significant history of alcoholism on his mother's side of the family  Social History: Patient used to sell marijuana for a living. He has had done this kind of thinking 3 years. He has been working in a [employer]. Lives with his girlfriend and HER-2 daughters.  The patient has 3 children ages 12, 2 and 3.  He is close to the 64 and 21 year old daughter's. 73 year old daughter lives out of state.  No current legal charges. Patient has been incarcerated in the past  He dropped out of school in the 11 grade but obtained a GED History  Alcohol Use  . 0.0 oz/week     Comment: occ     History  Drug Use No    Social History   Social History  . Marital status: Single    Spouse name: N/A  . Number of children: N/A  . Years of education: N/A   Social History Main Topics  . Smoking status: Current Every Day Smoker  . Smokeless tobacco: Never Used  . Alcohol use 0.0 oz/week     Comment: occ  . Drug use: No  . Sexual activity: Not Asked   Other Topics Concern  . None   Social History Narrative  . None    Hospital Course:    Major depressive disorder patient is currently interested only in psychotherapy. He is afraid of trying an antidepressants with this caused antidepressants risk and benefits but sees still prefers only therapy  Insomnia: continue trazodone prn for insomnia  HTN: Reluctant to start antihypertensive for today he agreed to low-dose Norvasc  Tobacco use: does not want nicotine patch  GI bleeding/ulcer: continue PPI bid. Will d/c all NSAIDs  Diet low sodium  F/u [facility]  Dispo: back home  Labs: TSH wnl  Patient reports feeling much improved. He is hopeful and future oriented. He denies suicidality, homicidality or having auditory or visual hallucinations. He denies having any problems with sleep appetite, energy or concentration.  He has been participating actively in programming. He has not displayed any unsafe or disruptive behaviors. His interactions with peers and staff have been appropriate  He denies any access to guns  Staff working with the patient's feels he is much improved and ready for discharge. He did not voice any concern about his safety  During examination he was calm, pleasant and cooperative. He does not appear depressed, manic or hypomanic. There is no evidence of psychosis. His mood was euthymic and his affect bright and reactive.   Physical Findings: AIMS:  , ,  ,  ,    CIWA:    COWS:     Musculoskeletal: Strength & Muscle Tone: within normal limits Gait & Station:  normal Patient leans: N/A  Psychiatric Specialty Exam: Physical Exam  Constitutional: He is oriented to person, place, and time. He appears well-developed and well-nourished.  HENT:  Head: Normocephalic and atraumatic.  Eyes: Conjunctivae and EOM are normal.  Neck: Normal range of motion.  Respiratory: Effort normal.  Musculoskeletal: Normal range of motion.  Neurological: He is alert and oriented to person, place, and time.    Review of Systems  Constitutional: Negative.   HENT: Negative.   Eyes: Negative.   Respiratory: Negative.   Cardiovascular: Negative.   Gastrointestinal: Negative.   Genitourinary: Negative.   Musculoskeletal: Negative.   Skin: Negative.   Neurological: Negative.   Endo/Heme/Allergies: Negative.   Psychiatric/Behavioral: Negative.     Blood pressure (!) 153/97, pulse 65, temperature 98.3 F (36.8 C), temperature source Oral, resp. rate 18, height 6' (1.829 m), weight 121.1 kg (267 lb), SpO2 100 %.Body mass index is 36.21 kg/m.  General Appearance: Well Groomed  Eye Contact:  Good  Speech:  Clear and Coherent  Volume:  Normal  Mood:  Euthymic  Affect:  Appropriate and Congruent  Thought Process:  Linear and Descriptions of Associations: Intact  Orientation:  Full (Time, Place, and Person)  Thought Content:  Hallucinations: None  Suicidal Thoughts:  No  Homicidal Thoughts:  No  Memory:  Immediate;   Good Recent;   Good Remote;   Good  Judgement:  Fair  Insight:  Fair  Psychomotor Activity:  Normal  Concentration:  Concentration: Good and Attention Span: Good  Recall:  Good  Fund of Knowledge:  Good  Language:  Good  Akathisia:  No  Handed:    AIMS (if indicated):     Assets:  Communication Skills Physical Health  ADL's:  Intact  Cognition:  WNL  Sleep:  Number of Hours: 6.15     Have you used any form of tobacco in the last 30 days? (Cigarettes, Smokeless Tobacco, Cigars, and/or Pipes): Yes  Has this patient used any form of  tobacco in the last 30 days? (Cigarettes, Smokeless Tobacco, Cigars, and/or Pipes) Yes, Yes, A prescription for an FDA-approved tobacco cessation medication was offered at discharge and the patient refused  Blood Alcohol level:  Lab Results  Component Value Date   ETH <5 81/19/1478    Metabolic Disorder Labs:  No results found for: HGBA1C, MPG No results found for: PROLACTIN Lab Results  Component Value Date   CHOL 176 07/02/2017   TRIG 198 (H) 07/02/2017   HDL 31 (L) 07/02/2017   CHOLHDL 5.7 07/02/2017   VLDL 40 07/02/2017   LDLCALC 105 (H) 07/02/2017   Results for FREDRICH, CORY (MRN [id number]) as of 07/05/2017 09:32  Ref. Range 06/30/2017 19:47 07/02/2017 07:00 07/02/2017 07:40  COMPREHENSIVE METABOLIC PANEL Unknown Rpt (A)    Sodium Latest Ref Range: 135 - 145 mmol/L 139    Potassium Latest Ref Range: 3.5 - 5.1 mmol/L 3.4 (L)    Chloride Latest Ref Range: 101 - 111 mmol/L 107    CO2 Latest Ref Range: 22 - 32 mmol/L 26    Glucose Latest Ref Range: 65 - 99 mg/dL 175 (H)    BUN Latest Ref Range: 6 - 20 mg/dL 10    Creatinine Latest Ref Range: 0.61 - 1.24 mg/dL 1.13    Calcium Latest Ref Range: 8.9 - 10.3 mg/dL 9.3    Anion gap Latest Ref Range: 5 - 15  6    Alkaline Phosphatase Latest Ref Range: 38 - 126 U/L 66    Albumin Latest Ref Range: 3.5 - 5.0 g/dL 4.2    AST Latest Ref Range: 15 - 41 U/L 22    ALT Latest Ref Range: 17 - 63 U/L 27    Total Protein Latest Ref Range: 6.5 - 8.1 g/dL 7.8    Total Bilirubin Latest Ref Range: 0.3 - 1.2 mg/dL 0.7    GFR, Est African American Latest Ref Range: >60 mL/min >60    GFR, Est Non African American Latest Ref Range: >60 mL/min >60    Total CHOL/HDL Ratio Latest Units: RATIO  5.7   Cholesterol Latest Ref Range: 0 - 200 mg/dL  176   HDL Cholesterol Latest Ref Range: >40 mg/dL  31 (L)   LDL (calc) Latest Ref Range: 0 - 99 mg/dL  105 (H)   Triglycerides Latest Ref Range: <150 mg/dL  198 (H)   VLDL Latest Ref Range: 0 - 40 mg/dL  40    WBC Latest Ref Range: 3.8 - 10.6 K/uL 5.9    RBC Latest Ref Range: 4.40 - 5.90 MIL/uL 5.04  Hemoglobin Latest Ref Range: 13.0 - 18.0 g/dL 12.7 (L)    HCT Latest Ref Range: 40.0 - 52.0 % 40.1    MCV Latest Ref Range: 80.0 - 100.0 fL 79.4 (L)    MCH Latest Ref Range: 26.0 - 34.0 pg 25.3 (L)    MCHC Latest Ref Range: 32.0 - 36.0 g/dL 31.8 (L)    RDW Latest Ref Range: 11.5 - 14.5 % 13.8    Platelets Latest Ref Range: 150 - 440 K/uL 205    Acetaminophen (Tylenol), S Latest Ref Range: 10 - 30 ug/mL <55 (L)    Salicylate Lvl Latest Ref Range: 2.8 - 30.0 mg/dL <7.0    TSH Latest Ref Range: 0.350 - 4.500 uIU/mL  0.792   Alcohol, Ethyl (B) Latest Ref Range: <5 mg/dL <5    Amphetamines, Ur Screen Latest Ref Range: NONE DETECTED  NONE DETECTED    Barbiturates, Ur Screen Latest Ref Range: NONE DETECTED  NONE DETECTED    Benzodiazepine, Ur Scrn Latest Ref Range: NONE DETECTED  NONE DETECTED    Cocaine Metabolite,Ur [place] Latest Ref Range: NONE DETECTED  NONE DETECTED    Methadone Scn, Ur Latest Ref Range: NONE DETECTED  NONE DETECTED    MDMA (Ecstasy)Ur Screen Latest Ref Range: NONE DETECTED  NONE DETECTED    Cannabinoid 50 Ng, Ur [place] Latest Ref Range: NONE DETECTED  POSITIVE (A)    Opiate, Ur Screen Latest Ref Range: NONE DETECTED  NONE DETECTED    Phencyclidine (PCP) Ur S Latest Ref Range: NONE DETECTED  NONE DETECTED    Tricyclic, Ur Screen Latest Ref Range: NONE DETECTED  NONE DETECTED    EKG 12-LEAD Unknown   Rpt   See Psychiatric Specialty Exam and Suicide Risk Assessment completed by Attending Physician prior to discharge.  Discharge destination:  Home  Is patient on multiple antipsychotic therapies at discharge:  No   Has Patient had three or more failed trials of antipsychotic monotherapy by history:  No  Recommended Plan for Multiple Antipsychotic Therapies: NA   Allergies as of 07/05/2017   No Known Allergies     Medication List    STOP taking these medications   naproxen 500  MG tablet Commonly known as:  NAPROSYN     TAKE these medications     Indication  amLODipine 5 MG tablet Commonly known as:  NORVASC Take 1 tablet (5 mg total) by mouth once.  Indication:  High Blood Pressure Disorder   pantoprazole 40 MG tablet Commonly known as:  PROTONIX Take 1 tablet (40 mg total) by mouth 2 (two) times daily.  Indication:  Stomach Ulcer      Follow-up Information    [place] on 07/09/2017.   Why:  7am.  For hospital Follow up and Peer Support Services.  Please keep in touch with Peer support Specialist Sherrian Divers if you have any questions about establishing services [phone] Contact information: [place] [address] [place], [place]. Go on 07/09/2017.   Specialty:  General Practice Why:  f/u for high blood pressure Contact information: [place] [address] [phone]           >30 minutes. >50 % of the time was spent in coordination of care  Signed: Hildred Priest, MD 07/05/2017, 10:10 AM

## 2017-07-05 NOTE — BHH Suicide Risk Assessment (Signed)
[facility] Discharge Suicide Risk Assessment   Principal Problem: Severe recurrent major depression [facility]) Discharge Diagnoses:  Patient Active Problem List   Diagnosis Date Noted  . 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 Tobacco use disorder [F17.200] 07/02/2017  . Cannabis use disorder, severe, dependence ([facility]) [F12.20] 07/02/2017  . Upper GI bleed [K92.2] 02/08/2016      Psychiatric Specialty Exam: ROS  Blood pressure (!) 135/93, pulse 60, temperature 98.3 F (36.8 C), temperature source Oral, resp. rate 18, height 6' (1.829 m), weight 121.1 kg (267 lb), SpO2 100 %.Body mass index is 36.21 kg/m.                                                       Mental Status Per Nursing Assessment::   On Admission:  Suicidal ideation indicated by patient  Demographic Factors:  Male  Loss Factors: Loss of significant relationship  Historical Factors: Prior suicide attempts and Impulsivity  Risk Reduction Factors:   Responsible for children under 75 years of age, Sense of responsibility to family, Employed, Living with another person, especially a relative and Positive social support  Denies access to guns  Continued Clinical Symptoms:  Depression:   Impulsivity Previous Psychiatric Diagnoses and Treatments  Cognitive Features That Contribute To Risk:  None    Suicide Risk:  Minimal: No identifiable suicidal ideation.  Patients presenting with no risk factors but with morbid ruminations; may be classified as minimal risk based on the severity of the depressive symptoms  Follow-up Information    [employer] Go on 07/09/2017.   Why:  7am.  For hospital Follow up and Peer Support Services.  Please keep in touch with Peer support Specialist Unk Pinto if you have any questions about establishing services [phone] Contact information: [address] [place] [address] [phone]             Jimmy Footman, MD 07/05/2017, 9:28 AM

## 2017-07-05 NOTE — Progress Notes (Signed)
D: Pt denies SI/HI/AVH. Pt is pleasant and cooperative, affect is flat but brightens upon approach. Pt appears less anxious and he is interacting with peers and staff appropriately.  A: Pt was offered support and encouragement. Pt was encouraged to attend groups. Q 15 minute checks were done for safety.  R:Pt attends groups and interacts well with peers and staff. Pt has no complaints.Pt receptive to treatment and safety maintained on unit.

## 2018-02-27 ENCOUNTER — Emergency Department: Payer: PRIVATE HEALTH INSURANCE

## 2018-02-27 ENCOUNTER — Emergency Department
Admission: EM | Admit: 2018-02-27 | Discharge: 2018-02-27 | Disposition: A | Discharge status: Home / Self Care | Payer: PRIVATE HEALTH INSURANCE | Attending: Student in an Organized Health Care Education/Training Program | Admitting: Student in an Organized Health Care Education/Training Program

## 2018-02-27 ENCOUNTER — Other Ambulatory Visit: Payer: Self-pay

## 2018-02-27 ENCOUNTER — Encounter: Payer: Self-pay | Admitting: Emergency Medicine

## 2018-02-27 DIAGNOSIS — E860 Dehydration: Secondary | ICD-10-CM

## 2018-02-27 DIAGNOSIS — F17200 Nicotine dependence, unspecified, uncomplicated: Secondary | ICD-10-CM | POA: Insufficient documentation

## 2018-02-27 DIAGNOSIS — Z79899 Other long term (current) drug therapy: Secondary | ICD-10-CM | POA: Insufficient documentation

## 2018-02-27 DIAGNOSIS — M25562 Pain in left knee: Secondary | ICD-10-CM | POA: Insufficient documentation

## 2018-02-27 DIAGNOSIS — R42 Dizziness and giddiness: Secondary | ICD-10-CM

## 2018-02-27 LAB — BASIC METABOLIC PANEL
ANION GAP: 9 (ref 5–15)
BUN: 12 mg/dL (ref 6–20)
CHLORIDE: 106 mmol/L (ref 101–111)
CO2: 23 mmol/L (ref 22–32)
Calcium: 9 mg/dL (ref 8.9–10.3)
Creatinine, Ser: 1.04 mg/dL (ref 0.61–1.24)
GFR calc Af Amer: >60 mL/min (ref >60)
GLUCOSE: 126 mg/dL — AB (ref 65–99)
POTASSIUM: 3.7 mmol/L (ref 3.5–5.1)
Sodium: 138 mmol/L (ref 135–145)

## 2018-02-27 LAB — URINALYSIS, COMPLETE (UACMP) WITH MICROSCOPIC
BACTERIA UA: NONE SEEN
BILIRUBIN URINE: NEGATIVE
Glucose, UA: NEGATIVE mg/dL
Hgb urine dipstick: NEGATIVE
KETONES UR: NEGATIVE mg/dL
Leukocytes, UA: NEGATIVE
Nitrite: NEGATIVE
Protein, ur: NEGATIVE mg/dL
SPECIFIC GRAVITY, URINE: 1.024 (ref 1.005–1.030)
pH: 6 (ref 5.0–8.0)

## 2018-02-27 LAB — CBC
HEMATOCRIT: 40.4 % (ref 40.0–52.0)
HEMOGLOBIN: 12.8 g/dL — AB (ref 13.0–18.0)
MCH: 24.8 pg — ABNORMAL LOW (ref 26.0–34.0)
MCHC: 31.6 g/dL — AB (ref 32.0–36.0)
MCV: 78.6 fL — AB (ref 80.0–100.0)
Platelets: 189 10*3/uL (ref 150–440)
RBC: 5.14 MIL/uL (ref 4.40–5.90)
RDW: 13.5 % (ref 11.5–14.5)
WBC: 5.7 10*3/uL (ref 3.8–10.6)

## 2018-02-27 IMAGING — CR DG KNEE COMPLETE 4+V*L*
4 series · 4 of 4 positions shown · non-contrast
Comparison: None.

CLINICAL DATA: Left knee pain just distal to the patella.

EXAM:
LEFT KNEE - COMPLETE 4+ VIEW

[knee ap]
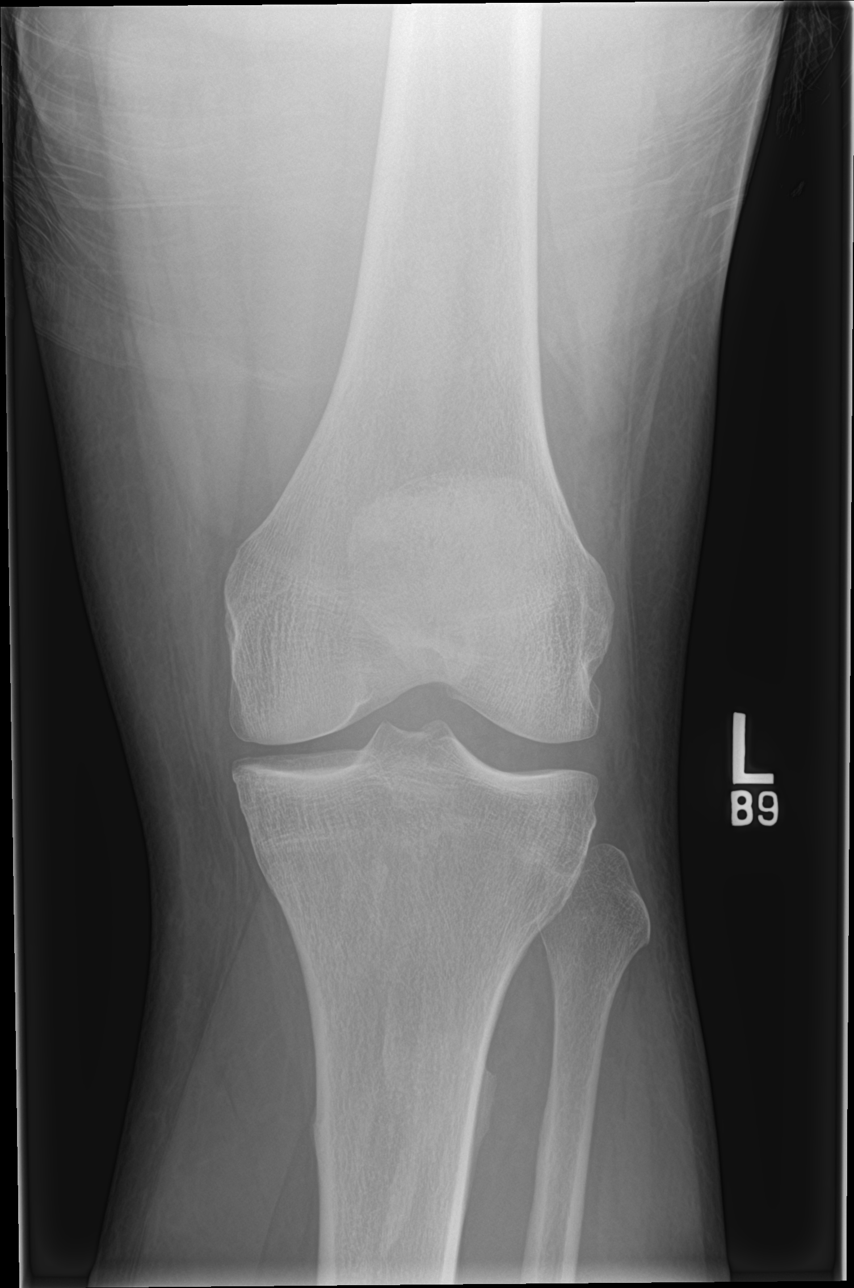

[knee obl (1 of 2)]
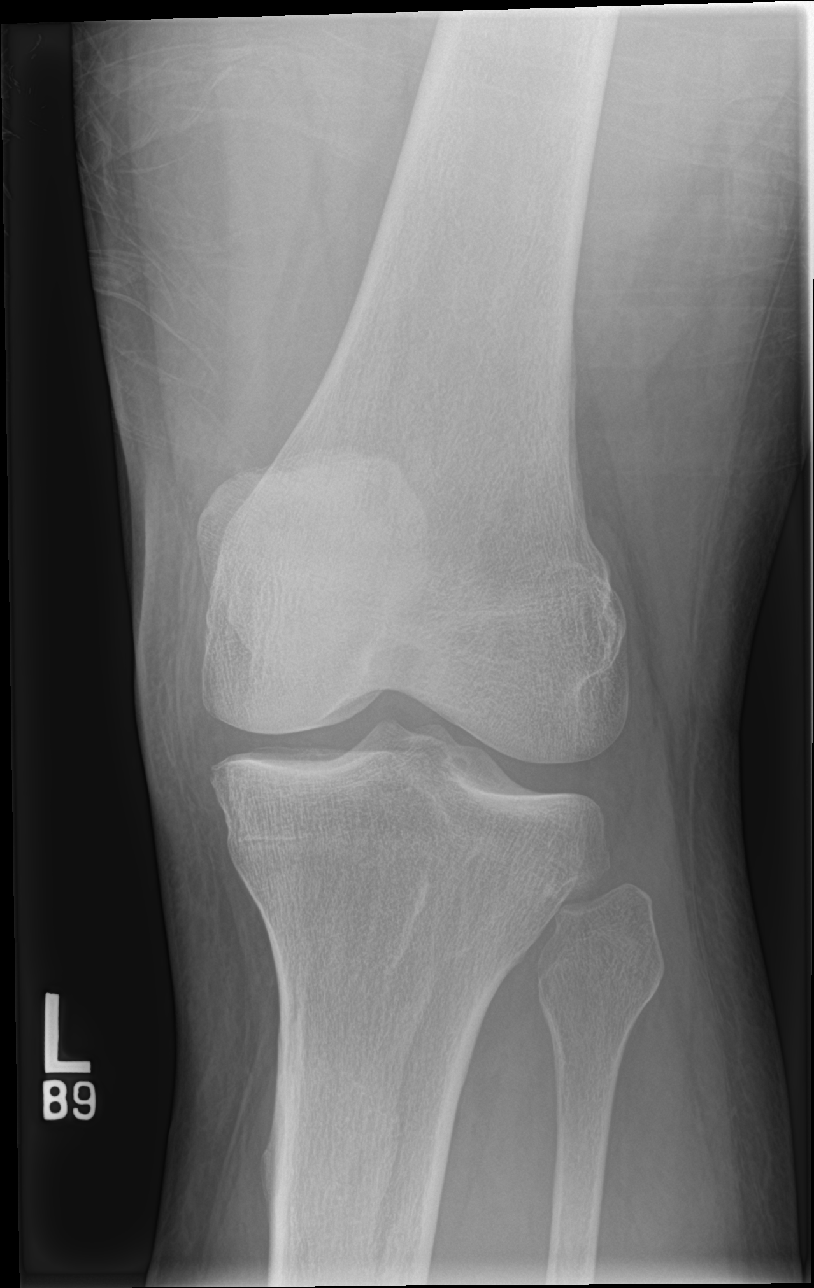

[knee obl (2 of 2)]
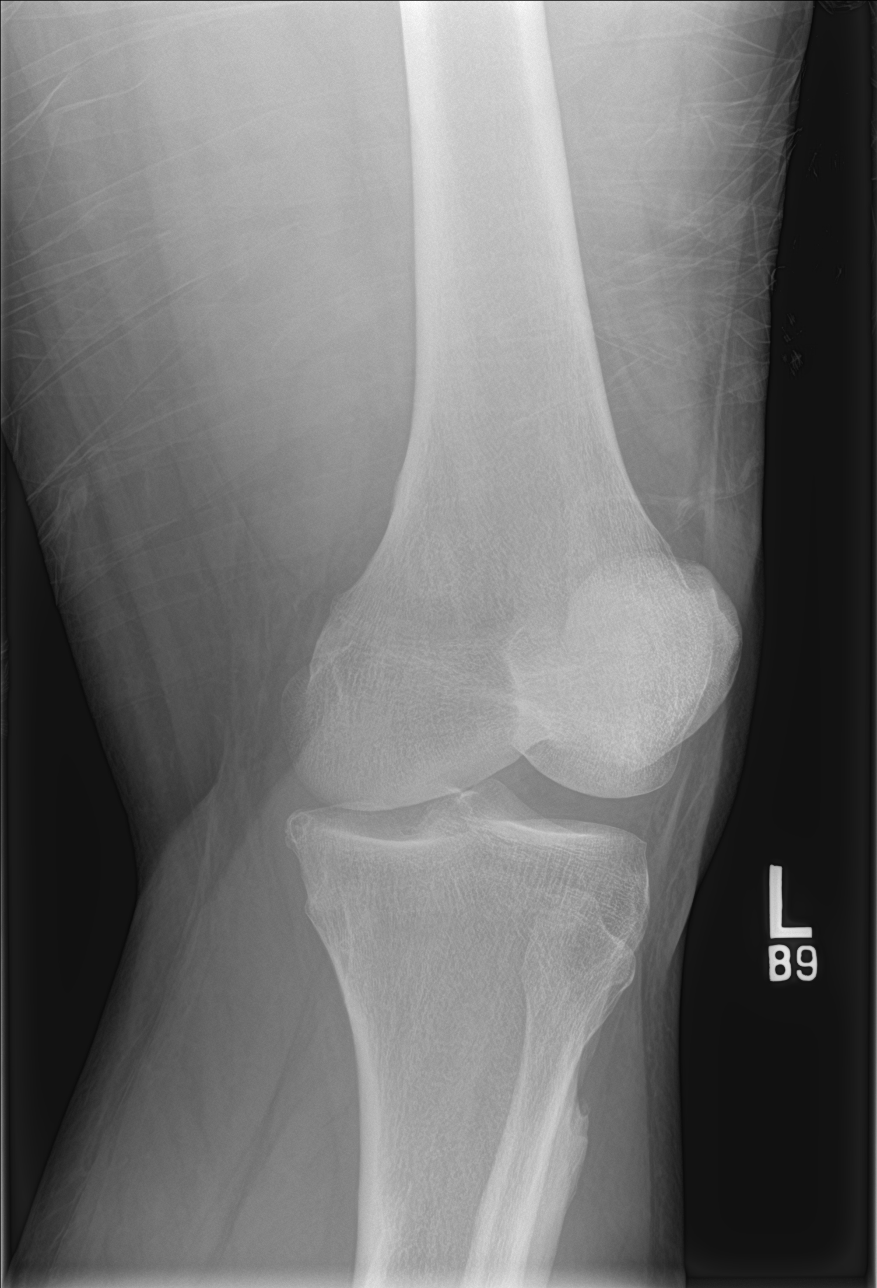

[knee lat]
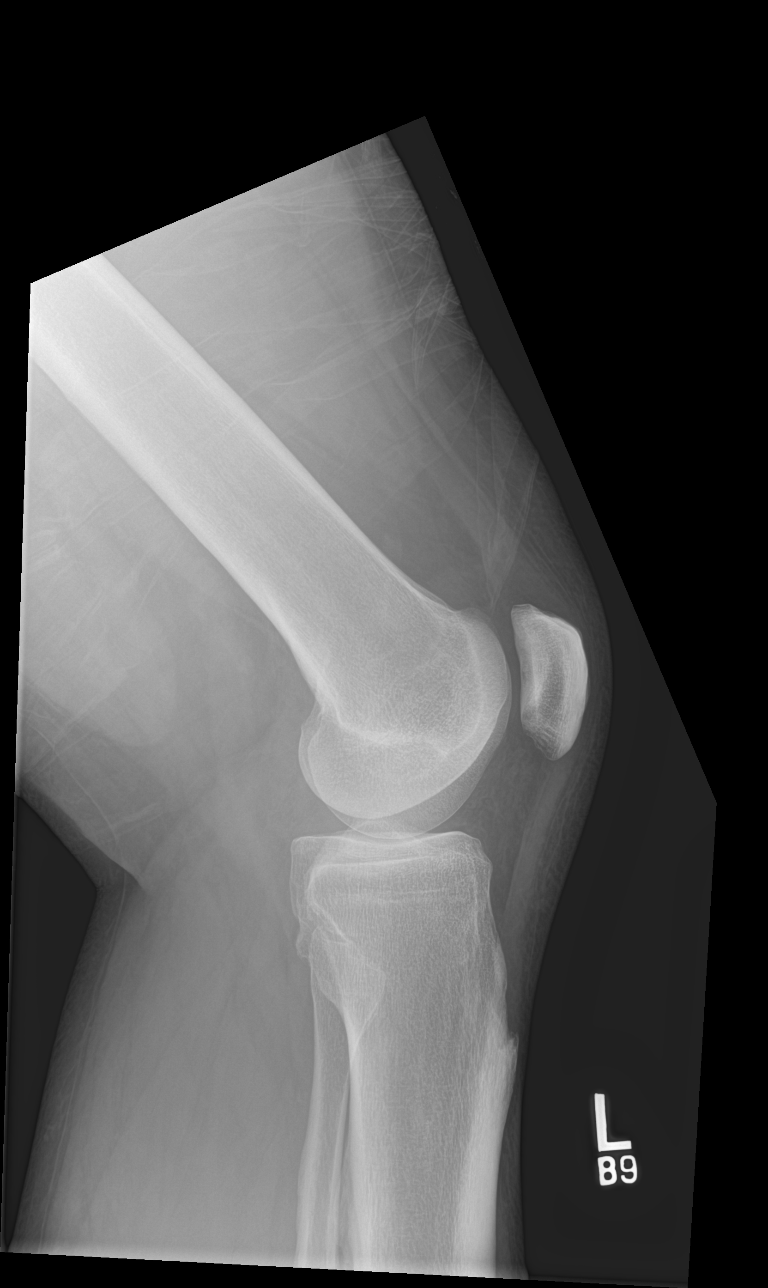

[4 of 4 positions shown; findings below may reference images not displayed]

FINDINGS: The left knee is located. No acute bone or soft tissue abnormality
is present. The patella is within normal limits. Mild irregularity
at the tibial tuberosity likely reflects the sequela
Osgood-Schlatter's disease.
IMPRESSION: Negative.

## 2018-02-27 MED ORDER — SODIUM CHLORIDE 0.9 % IV BOLUS (SEPSIS)
1000.0000 mL | Freq: Once | INTRAVENOUS | Status: AC
  Administered 2018-02-27: 1000 mL via INTRAVENOUS

## 2018-02-27 NOTE — ED Triage Notes (Signed)
Pt reports that he was at work today and began to get dizzy, pt states that they took his B/P and told him it was high and to come to the ER. Pt does not have hx of HTN and does not take medications for it.

## 2018-02-27 NOTE — ED Provider Notes (Signed)
[facility] Emergency Department Provider Note    None    (approximate)  I have reviewed the triage vital signs and the nursing notes.   HISTORY  Chief Complaint Dizziness    HPI Roger Pennington is a 42 y.o. male history of occasional smoking presents with lightheadedness that started at work today.  Patient did not have anything for breakfast and typically drinks a lot of water but did not drink much today.  States that he works In a [employer] and has to wear a full vapor barrier where he is frequently sweating and feeling very hot and overheated.  Today was no different.  Started feeling lightheaded without any numbness or tingling.  Denied any chest pain palpitations or shortness of breath.  No blurry vision.  He was seen by his supervisor who checked his blood pressure and noted it was elevated.  Patient with no history of elevated blood pressure or diabetes.  Does have some left knee pain which is been bothering him for quite some time now states is worse with walking.  Pain is mild to moderate.  Past Medical History:  Diagnosis Date  . Tobacco abuse    Family History  Problem Relation Age of Onset  . Hypertension Father   . Hypertension Brother    Past Surgical History:  Procedure Laterality Date  . ESOPHAGOGASTRODUODENOSCOPY (EGD) WITH PROPOFOL N/A 02/09/2016   Procedure: ESOPHAGOGASTRODUODENOSCOPY (EGD) WITH PROPOFOL;  Surgeon: Christena Deem, MD;  Location[facility] ENDOSCOPY;  Service: Endoscopy;  Laterality: N/A;   Patient Active Problem List   Diagnosis Date Noted  . Severe recurrent major depression (HCC) 07/02/2017  . Tobacco use disorder 07/02/2017  . Cannabis use disorder, severe, dependence (HCC) 07/02/2017  . Upper GI bleed 02/08/2016      Prior to Admission medications   Medication Sig Start Date End Date Taking? Authorizing Provider  amLODipine (NORVASC) 5 MG tablet Take 1 tablet (5 mg total) by  mouth once. 07/05/17 07/05/17  Roger Footman, MD  pantoprazole (PROTONIX) 40 MG tablet Take 1 tablet (40 mg total) by mouth 2 (two) times daily. 07/04/17   Roger Footman, MD    Allergies Patient has no known allergies.    Social History Social History   Tobacco Use  . Smoking status: Current Every Day Smoker  . Smokeless tobacco: Never Used  Substance Use Topics  . Alcohol use: Yes    Alcohol/week: 0.0 oz    Comment: occ  . Drug use: No    Review of Systems Patient denies headaches, rhinorrhea, blurry vision, numbness, shortness of breath, chest pain, edema, cough, abdominal pain, nausea, vomiting, diarrhea, dysuria, fevers, rashes or hallucinations unless otherwise stated above in HPI. ____________________________________________   PHYSICAL EXAM:  VITAL SIGNS: Vitals:   02/27/18 1130  BP: (!) 160/95  Pulse: 77  Resp: 20  Temp: 98.4 F (36.9 C)  SpO2: 98%    Constitutional: Alert and oriented. Well appearing and in no acute distress. Eyes: Conjunctivae are normal.  Head: Atraumatic. Nose: No congestion/rhinnorhea. Mouth/Throat: Mucous membranes are moist.   Neck: No stridor. Painless ROM.  Cardiovascular: Normal rate, regular rhythm. Grossly normal heart sounds.  Good peripheral circulation. Respiratory: Normal respiratory effort.  No retractions. Lungs CTAB. Gastrointestinal: Soft and nontender. No distention. No abdominal bruits. No CVA tenderness. Genitourinary:  Musculoskeletal: No lower extremity tenderness nor edema.  No joint effusions. Neurologic:  CN- intact.  No facial droop, Normal FNF.  Normal heel to shin.  Sensation intact bilaterally. Normal speech and language. No gross focal neurologic deficits are appreciated. No gait instability. Skin:  Skin is warm, dry and intact. No rash noted. Psychiatric: Mood and affect are normal. Speech and behavior are normal.  ____________________________________________   LABS (all labs  ordered are listed, but only abnormal results are displayed)  Results for orders placed or performed during the hospital encounter of 02/27/18 (from the past 24 hour(s))  Basic metabolic panel     Status: Abnormal   Collection Time: 02/27/18 11:38 AM  Result Value Ref Range   Sodium 138 135 - 145 mmol/L   Potassium 3.7 3.5 - 5.1 mmol/L   Chloride 106 101 - 111 mmol/L   CO2 23 22 - 32 mmol/L   Glucose, Bld 126 (H) 65 - 99 mg/dL   BUN 12 6 - 20 mg/dL   Creatinine, Ser 7.20 0.61 - 1.24 mg/dL   Calcium 9.0 8.9 - 94.7 mg/dL   GFR calc non Af Amer >60 >60 mL/min   GFR calc Af Amer >60 >60 mL/min   Anion gap 9 5 - 15  CBC     Status: Abnormal   Collection Time: 02/27/18 11:38 AM  Result Value Ref Range   WBC 5.7 3.8 - 10.6 K/uL   RBC 5.14 4.40 - 5.90 MIL/uL   Hemoglobin 12.8 (L) 13.0 - 18.0 g/dL   HCT 09.6 28.3 - 66.2 %   MCV 78.6 (L) 80.0 - 100.0 fL   MCH 24.8 (L) 26.0 - 34.0 pg   MCHC 31.6 (L) 32.0 - 36.0 g/dL   RDW 94.7 65.4 - 65.0 %   Platelets 189 150 - 440 K/uL   ____________________________________________  EKG My review and personal interpretation at Time: 11:31   Indication: chest pain  Rate: 75  Rhythm: sinus Axis: normal Other: normal intervals, no stemi ____________________________________________  RADIOLOGY  I personally reviewed all radiographic images ordered to evaluate for the above acute complaints and reviewed radiology reports and findings.  These findings were personally discussed with the patient.  Please see medical record for radiology report.  ____________________________________________   PROCEDURES  Procedure(s) performed:  Procedures    Critical Care performed: no ____________________________________________   INITIAL IMPRESSION / ASSESSMENT AND PLAN / ED COURSE  Pertinent labs & imaging results that were available during my care of the patient were reviewed by me and considered in my medical decision making (see chart for  details).  DDX: dehydration, electrolyte abn, dm, htn, cva, fracture, arthritis  Roger Pennington is a 42 y.o. who presents to the ED with symptoms as described above he is afebrile and hemodynamically stable.  His neuro exam is nonfocal.  This not clinically consistent with CVA.  Most clinically consistent with dehydration based on his history.  Will also order x-ray of left knee as he does have some tenderness over the tibial tuberosity will develop for fracture.  Will provide IV fluids for rehydration and reassess.  Clinical Course as of Feb 27 1730  Wed Feb 27, 2018  1607 Patient reassessed and feels much improved after IV fluids.  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  Patient stable for outpatient follow-up.  [PR]    Clinical Course User Index [PR] Willy Eddy, MD     ____________________________________________   FINAL CLINICAL IMPRESSION(S) / ED DIAGNOSES  Final diagnoses:  Acute pain of left knee  Lightheadedness  Dehydration      NEW MEDICATIONS STARTED DURING THIS VISIT:  New Prescriptions  No medications on file     Note:  This document was prepared using Dragon voice recognition software and may include unintentional dictation errors.    Willy Eddy, MD 02/27/18 [phone]

## 2018-09-25 ENCOUNTER — Encounter: Payer: Self-pay | Admitting: Emergency Medicine

## 2018-09-25 ENCOUNTER — Other Ambulatory Visit: Payer: Self-pay

## 2018-09-25 ENCOUNTER — Emergency Department
Admission: EM | Admit: 2018-09-25 | Discharge: 2018-09-25 | Disposition: A | Discharge status: Home / Self Care | Payer: Self-pay | Attending: Emergency Medicine | Admitting: Emergency Medicine

## 2018-09-25 DIAGNOSIS — F17200 Nicotine dependence, unspecified, uncomplicated: Secondary | ICD-10-CM | POA: Diagnosis not present

## 2018-09-25 DIAGNOSIS — R05 Cough: Secondary | ICD-10-CM | POA: Insufficient documentation

## 2018-09-25 DIAGNOSIS — M7918 Myalgia, other site: Secondary | ICD-10-CM | POA: Diagnosis present

## 2018-09-25 DIAGNOSIS — J069 Acute upper respiratory infection, unspecified: Secondary | ICD-10-CM | POA: Diagnosis not present

## 2018-09-25 DIAGNOSIS — R070 Pain in throat: Secondary | ICD-10-CM | POA: Insufficient documentation

## 2018-09-25 MED ORDER — PSEUDOEPH-BROMPHEN-DM 30-2-10 MG/5ML PO SYRP: 5.0000 mL | ORAL_SOLUTION | Freq: Four times a day (QID) | ORAL | 0 refills | Status: DC | PRN

## 2018-09-25 NOTE — ED Notes (Signed)
See triage note  States he developed body aches and sore throat on Monday  States he took some OTC meds and had some relief  Unsure of fever but afebrile on arrival  conts tohave sore throat with slight body aches

## 2018-09-25 NOTE — Discharge Instructions (Signed)
[facility] if any continued problems.  Increase fluids.  Take Tylenol as needed for your throat pain.  Discontinue smoking.  Begin taking Bromfed-DM as needed for cough and congestion.

## 2018-09-25 NOTE — ED Triage Notes (Signed)
C/O fever and sore throat x 2 days.

## 2018-09-25 NOTE — ED Provider Notes (Signed)
[facility] Emergency Department Provider Note  ____________________________________________   First MD Initiated Contact with Patient 09/25/18 1030     (approximate)  I have reviewed the triage vital signs and the nursing notes.   HISTORY  Chief Complaint Fever and Sore Throat   HPI Roger Pennington is a 42 y.o. male presents to the ED with complaint of body aches, sore throat for approximately 4 days.  Patient states that he took over-the-counter medication with minimal relief.  He is unsure of any fever and denies chills.  He denies any contact with anyone having strep throat or the flu.  Patient denies any nausea, vomiting or diarrhea.  He has however had a cough.  He continues to smoke cigarettes.  Past Medical History:  Diagnosis Date  . Tobacco abuse     Patient Active Problem List   Diagnosis Date Noted  . Severe recurrent major depression (HCC) 07/02/2017  . Tobacco use disorder 07/02/2017  . Cannabis use disorder, severe, dependence (HCC) 07/02/2017  . Upper GI bleed 02/08/2016    Past Surgical History:  Procedure Laterality Date  . ESOPHAGOGASTRODUODENOSCOPY (EGD) WITH PROPOFOL N/A 02/09/2016   Procedure: ESOPHAGOGASTRODUODENOSCOPY (EGD) WITH PROPOFOL;  Surgeon: Christena Deem, MD;  Location: [facility] ENDOSCOPY;  Service: Endoscopy;  Laterality: N/A;    Prior to Admission medications   Medication Sig Start Date End Date Taking? Authorizing Provider  amLODipine (NORVASC) 5 MG tablet Take 1 tablet (5 mg total) by mouth once. 07/05/17 07/05/17  Jimmy Footman, MD  brompheniramine-pseudoephedrine-DM 30-2-10 MG/5ML syrup Take 5 mLs by mouth 4 (four) times daily as needed. 09/25/18   Tommi Rumps, PA-C    Allergies Patient has no known allergies.  Family History  Problem Relation Age of Onset  . Hypertension Father   . Hypertension Brother     Social History Social History   Tobacco Use  . Smoking status: Current  Every Day Smoker  . Smokeless tobacco: Never Used  Substance Use Topics  . Alcohol use: Yes    Alcohol/week: 0.0 standard drinks    Comment: occ  . Drug use: No    Review of Systems Constitutional: No fever/chills Eyes: No visual changes. ENT: Positive sore throat.  Positive nasal congestion. Cardiovascular: Denies chest pain. Respiratory: Denies shortness of breath.  Positive nonproductive cough. Gastrointestinal: No abdominal pain.  No nausea, no vomiting.  No diarrhea.  No constipation. Musculoskeletal: Negative for back pain. Skin: Negative for rash. Neurological: Negative for headaches, focal weakness or numbness. ___________________________________________   PHYSICAL EXAM:  VITAL SIGNS: ED Triage Vitals  Enc Vitals Group     BP 09/25/18 1023 (!) 164/93     Pulse Rate 09/25/18 1023 84     Resp 09/25/18 1023 18     Temp 09/25/18 1023 98.4 F (36.9 C)     Temp Source 09/25/18 1023 Oral     SpO2 09/25/18 1023 93 %     Weight 09/25/18 1021 264 lb 15.9 oz (120.2 kg)     Height --      Head Circumference --      Peak Flow --      Pain Score 09/25/18 1020 8     Pain Loc --      Pain Edu? --      Excl. in GC? --    Constitutional: Alert and oriented. Well appearing and in no acute distress. Eyes: Conjunctivae are normal. PERRL. EOMI. Head: Atraumatic. Nose: Moderate congestion/rhinnorhea.  EACs and TMs are  clear bilaterally. Mouth/Throat: Mucous membranes are moist.  Oropharynx non-erythematous.  Posterior drainage is noted.  No exudate and uvula is midline. Neck: No stridor.   Hematological/Lymphatic/Immunilogical: No cervical lymphadenopathy. Cardiovascular: Normal rate, regular rhythm. Grossly normal heart sounds.  Good peripheral circulation. Respiratory: Normal respiratory effort.  No retractions. Lungs CTAB. Gastrointestinal: Soft and nontender. No distention. Musculoskeletal: His upper and lower extremities without any difficulty.  Normal gait was  noted. Neurologic:  Normal speech and language. No gross focal neurologic deficits are appreciated.  Skin:  Skin is warm, dry and intact. No rash noted. Psychiatric: Mood and affect are normal. Speech and behavior are normal.  ____________________________________________   LABS (all labs ordered are listed, but only abnormal results are displayed)  Labs Reviewed - No data to display ____________________________________________  PROCEDURES  Procedure(s) performed: None  Procedures  Critical Care performed: No  ____________________________________________   INITIAL IMPRESSION / ASSESSMENT AND PLAN / ED COURSE  As part of my medical decision making, I reviewed the following data within the electronic MEDICAL RECORD NUMBER Notes from prior ED visits and [place] Controlled Substance Database  Patient presents with upper respiratory symptoms for which over-the-counter medication has not helped.  He denies any fever, chills, nausea or vomiting.  There is been no history of strep exposure.  Physical exam is consistent with URI.  Patient was given a prescription for Bromfed-DM 4 times daily as needed for cough and congestion.  He is to increase fluids and decrease smoking.  He is instructed to follow-up with his PCP if any continued problems.  ____________________________________________   FINAL CLINICAL IMPRESSION(S) / ED DIAGNOSES  Final diagnoses:  Upper respiratory tract infection, unspecified type     ED Discharge Orders         Ordered    brompheniramine-pseudoephedrine-DM 30-2-10 MG/5ML syrup  4 times daily PRN     09/25/18 1056           Note:  This document was prepared using Dragon voice recognition software and may include unintentional dictation errors.    Tommi Rumps, PA-C 09/25/18 1457    Emily Filbert, MD 09/25/18 [phone]

## 2018-12-02 ENCOUNTER — Other Ambulatory Visit: Payer: Self-pay

## 2018-12-02 ENCOUNTER — Emergency Department
Admission: EM | Admit: 2018-12-02 | Discharge: 2018-12-02 | Disposition: A | Discharge status: Home / Self Care | Payer: Self-pay | Attending: Emergency Medicine | Admitting: Emergency Medicine

## 2018-12-02 ENCOUNTER — Emergency Department: Payer: Self-pay

## 2018-12-02 DIAGNOSIS — R51 Headache: Secondary | ICD-10-CM | POA: Insufficient documentation

## 2018-12-02 DIAGNOSIS — Z79899 Other long term (current) drug therapy: Secondary | ICD-10-CM | POA: Insufficient documentation

## 2018-12-02 DIAGNOSIS — F17200 Nicotine dependence, unspecified, uncomplicated: Secondary | ICD-10-CM | POA: Insufficient documentation

## 2018-12-02 DIAGNOSIS — I10 Essential (primary) hypertension: Secondary | ICD-10-CM | POA: Insufficient documentation

## 2018-12-02 DIAGNOSIS — R519 Headache, unspecified: Secondary | ICD-10-CM

## 2018-12-02 LAB — CBC WITH DIFFERENTIAL/PLATELET
ABS IMMATURE GRANULOCYTES: 0.02 10*3/uL (ref 0.00–0.07)
BASOS PCT: 0 %
Basophils Absolute: 0 10*3/uL (ref 0.0–0.1)
Eosinophils Absolute: 0.3 10*3/uL (ref 0.0–0.5)
Eosinophils Relative: 5 %
HCT: 42.4 % (ref 39.0–52.0)
HEMOGLOBIN: 13.2 g/dL (ref 13.0–17.0)
IMMATURE GRANULOCYTES: 0 %
LYMPHS ABS: 2.4 10*3/uL (ref 0.7–4.0)
LYMPHS PCT: 49 %
MCH: 25.3 pg — AB (ref 26.0–34.0)
MCHC: 31.1 g/dL (ref 30.0–36.0)
MCV: 81.4 fL (ref 80.0–100.0)
MONOS PCT: 9 %
Monocytes Absolute: 0.4 10*3/uL (ref 0.1–1.0)
NEUTROS ABS: 1.8 10*3/uL (ref 1.7–7.7)
Neutrophils Relative %: 37 %
PLATELETS: 213 10*3/uL (ref 150–400)
RBC: 5.21 MIL/uL (ref 4.22–5.81)
RDW: 12.7 % (ref 11.5–15.5)
WBC: 5 10*3/uL (ref 4.0–10.5)
nRBC: 0 % (ref 0.0–0.2)

## 2018-12-02 LAB — BASIC METABOLIC PANEL
ANION GAP: 8 (ref 5–15)
BUN: 12 mg/dL (ref 6–20)
CHLORIDE: 104 mmol/L (ref 98–111)
CO2: 24 mmol/L (ref 22–32)
Calcium: 8.7 mg/dL — ABNORMAL LOW (ref 8.9–10.3)
Creatinine, Ser: 1.01 mg/dL (ref 0.61–1.24)
GFR calc Af Amer: >60 mL/min (ref >60)
Glucose, Bld: 131 mg/dL — ABNORMAL HIGH (ref 70–99)
POTASSIUM: 3.6 mmol/L (ref 3.5–5.1)
Sodium: 136 mmol/L (ref 135–145)

## 2018-12-02 LAB — TROPONIN I: Troponin I: <0.03 ng/mL (ref <0.03)

## 2018-12-02 IMAGING — CT CT HEAD W/O CM
3 series · 16 of 47 positions shown, 19 images · non-contrast
Comparison: None.

CLINICAL DATA: Headache for 3 days

EXAM:
CT HEAD WITHOUT CONTRAST
TECHNIQUE: Contiguous axial images were obtained from the base of the skull
through the vertex without intravenous contrast.

[Series 2: head wo · axial · 0.47mm/px · z∈[-135,-10]mm · 10 of 31 slices shown, 13 images]
[im 3/31  brain]
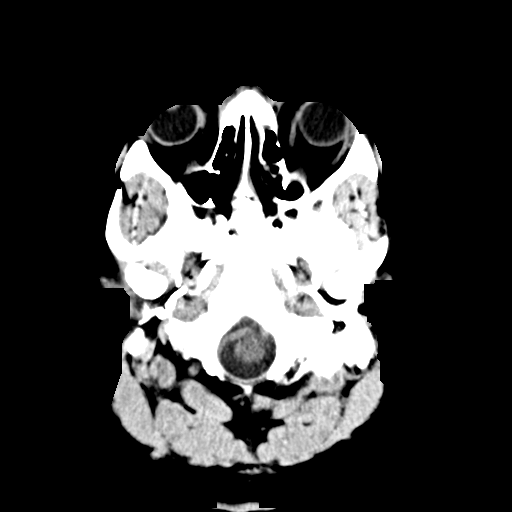
[im 3/31  bone]
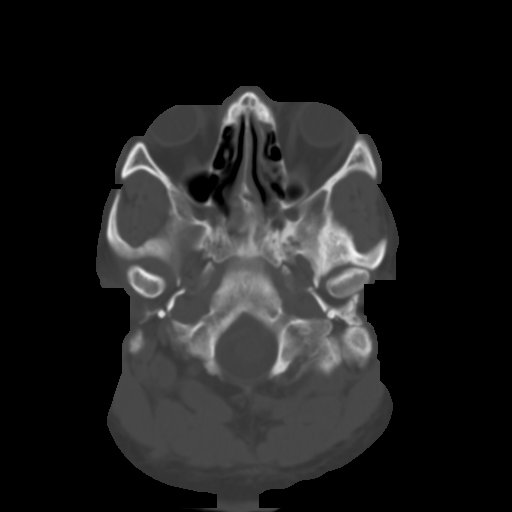
[im 6/31  brain]
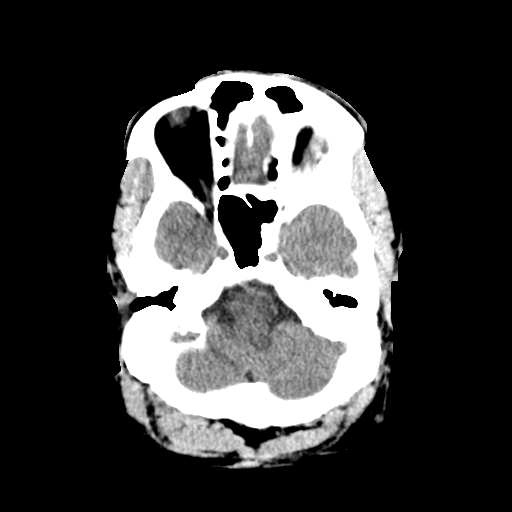
[im 9/31  brain]
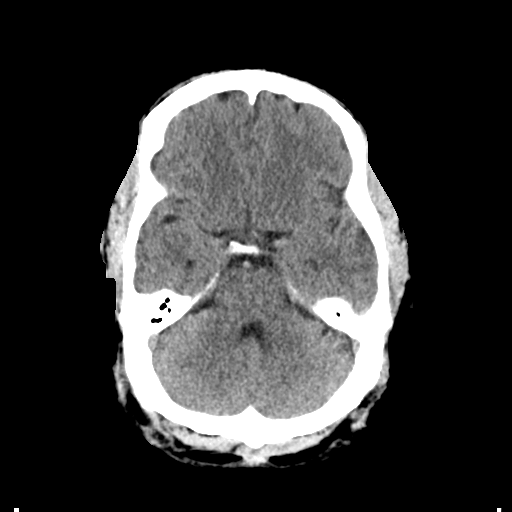
[im 11/31  brain]
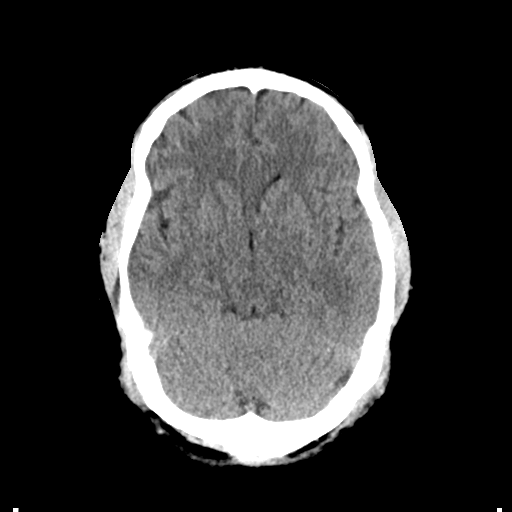
[im 14/31  brain]
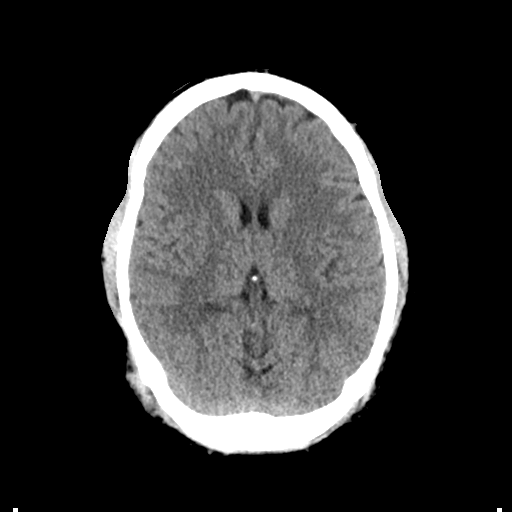
[im 14/31  bone]
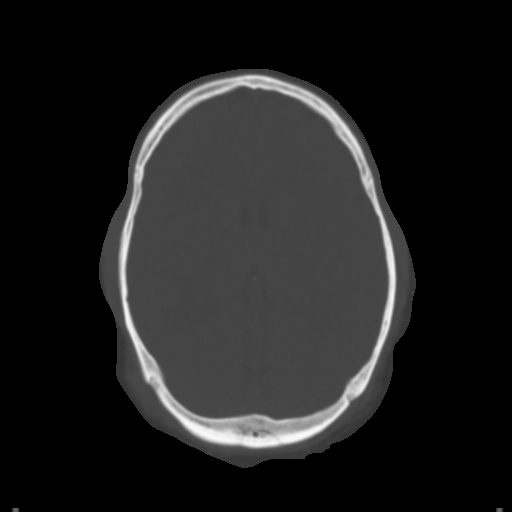
[im 17/31  brain]
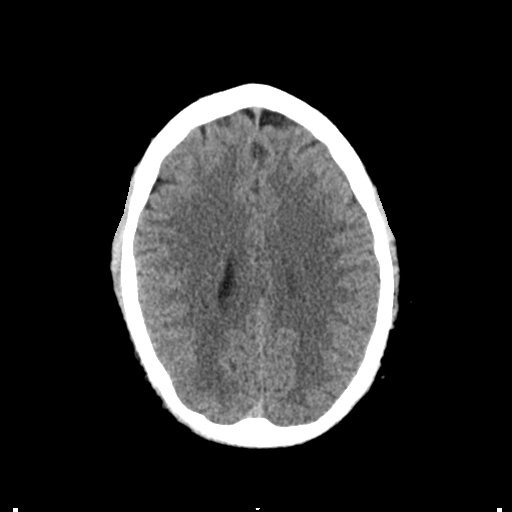
[im 20/31  brain]
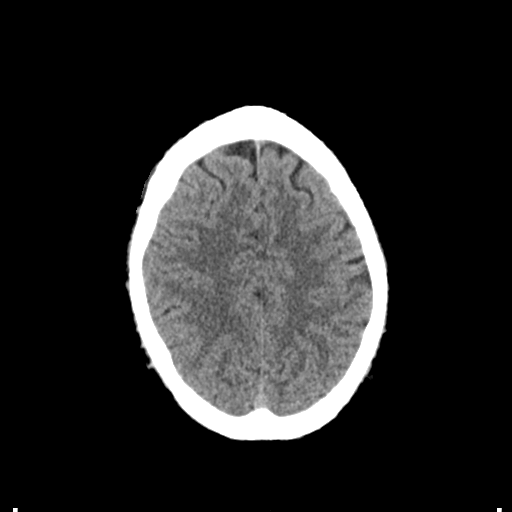
[im 23/31  brain]
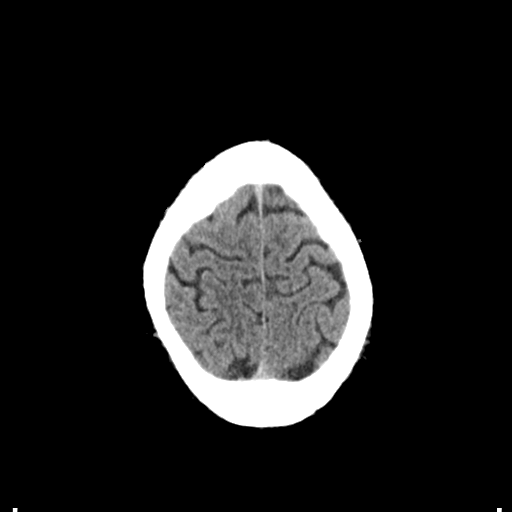
[im 25/31  brain]
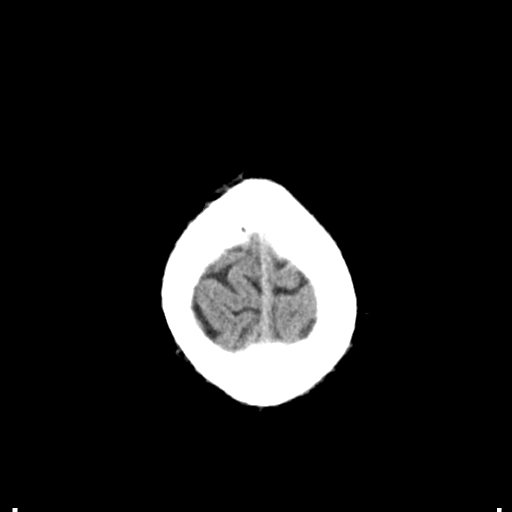
[im 25/31  bone]
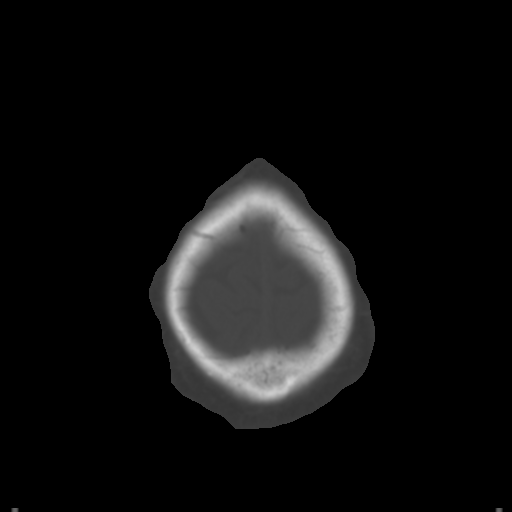
[im 28/31  brain]
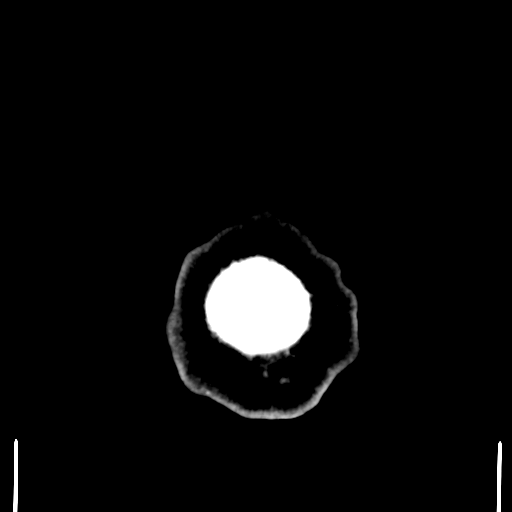

[Series 4: coronal soft tissue · coronal · 0.30mm/px · 3 of 68 slices shown]
[im 23/68  brain]
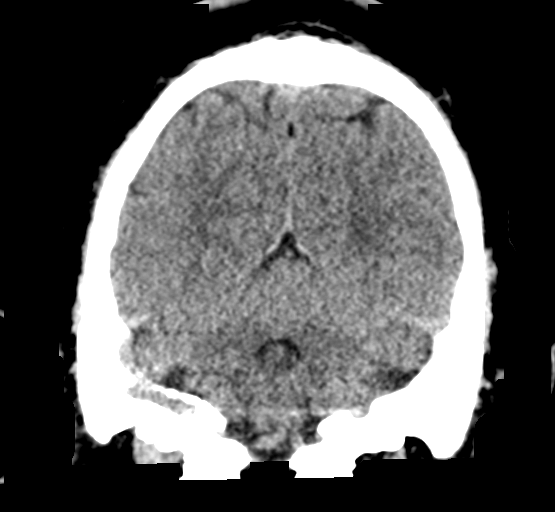
[im 30/68  brain]
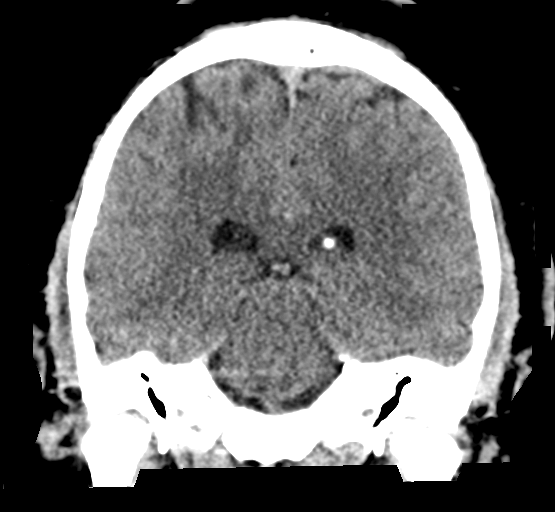
[im 38/68  brain]
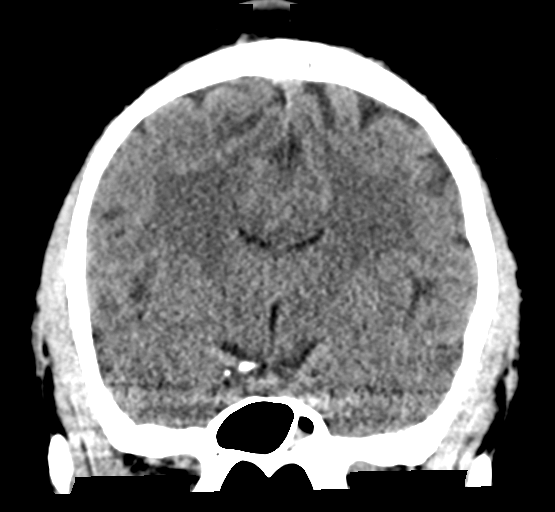

[Series 5: sagittal soft tissue · sagittal · 0.31mm/px · 3 of 57 slices shown]
[im 19/57  brain]
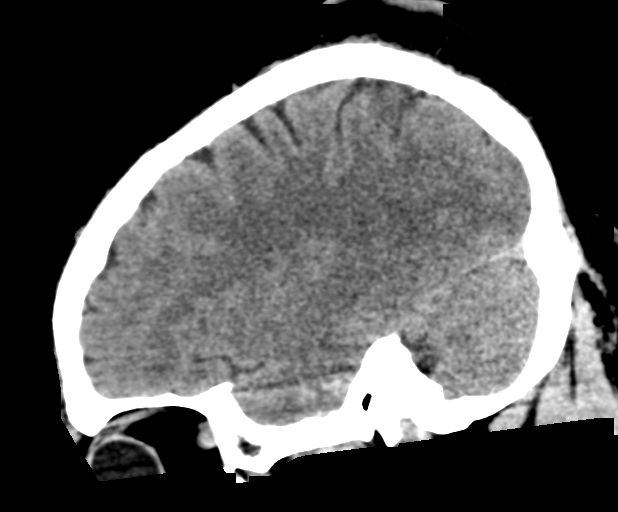
[im 29/57  brain]
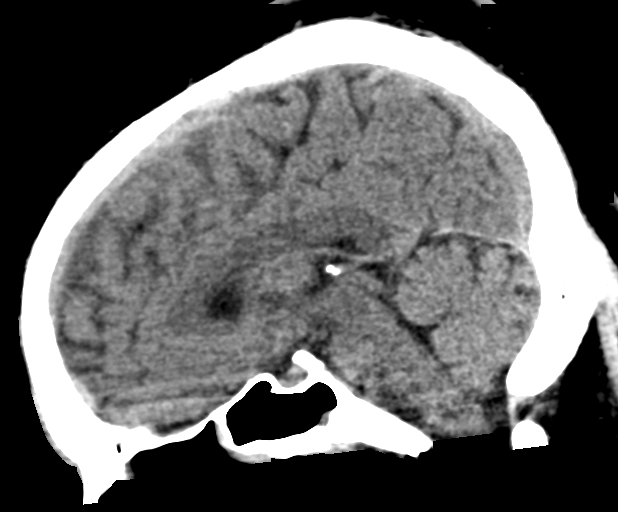
[im 38/57  brain]
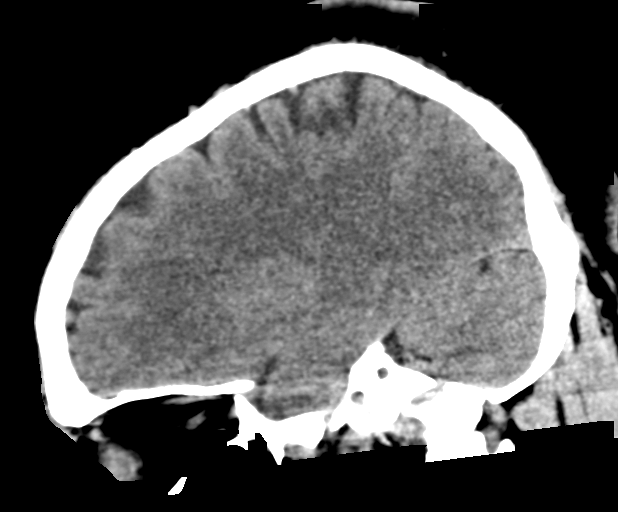

[16 of 47 positions shown; findings below may reference images not displayed]

FINDINGS: Brain: The ventricles are normal in size and configuration. There is
no intracranial mass, hemorrhage, extra-axial fluid collection, or
midline shift. Brain parenchyma appears unremarkable. No acute
infarct evident.

Vascular: No hyperdense vessel. There is no appreciable vascular
calcification.

Skull: The bony calvarium appears intact.

Sinuses/Orbits: There is mucosal thickening and opacification in
several ethmoid air cells. There is mucosal thickening in both
posterior sphenoid sinus regions. Visualized orbits appear symmetric
bilaterally.

Other: Mastoids are hypoplastic bilaterally.
IMPRESSION: Areas of paranasal sinus disease. Hypoplastic mastoids. Study
otherwise unremarkable.

## 2018-12-02 MED ORDER — BUTALBITAL-APAP-CAFFEINE 50-325-40 MG PO TABS
2.0000 | ORAL_TABLET | Freq: Once | ORAL | Status: AC
  Administered 2018-12-02: 2 via ORAL
  Filled 2018-12-02: qty 2

## 2018-12-02 MED ORDER — HYDROCHLOROTHIAZIDE 25 MG PO TABS
25.0000 mg | ORAL_TABLET | Freq: Every day | ORAL | Status: DC
  Administered 2018-12-02: 25 mg via ORAL
  Filled 2018-12-02: qty 1

## 2018-12-02 MED ORDER — HYDROCHLOROTHIAZIDE 25 MG PO TABS: 25.0000 mg | ORAL_TABLET | Freq: Every day | ORAL | 1 refills | Status: DC

## 2018-12-02 MED ORDER — KETOROLAC TROMETHAMINE 30 MG/ML IJ SOLN
15.0000 mg | Freq: Once | INTRAMUSCULAR | Status: AC
  Administered 2018-12-02: 15 mg via INTRAVENOUS
  Filled 2018-12-02: qty 1

## 2018-12-02 MED ORDER — DIPHENHYDRAMINE HCL 50 MG/ML IJ SOLN
25.0000 mg | Freq: Once | INTRAMUSCULAR | Status: AC
  Administered 2018-12-02: 25 mg via INTRAVENOUS
  Filled 2018-12-02: qty 1

## 2018-12-02 MED ORDER — BUTALBITAL-APAP-CAFFEINE 50-325-40 MG PO TABS: 1.0000 | ORAL_TABLET | Freq: Four times a day (QID) | ORAL | 0 refills | Status: DC | PRN

## 2018-12-02 MED ORDER — METOCLOPRAMIDE HCL 5 MG/ML IJ SOLN
10.0000 mg | Freq: Once | INTRAMUSCULAR | Status: AC
  Administered 2018-12-02: 10 mg via INTRAVENOUS
  Filled 2018-12-02: qty 2

## 2018-12-02 NOTE — Discharge Instructions (Addendum)

## 2018-12-02 NOTE — ED Triage Notes (Signed)
Patient presents for a headache x 3 days. States [facility] took him off of his blood pressure medications about six months ago. Had had a headache for three days and was unaware his blood pressure might have been up. Denies CP, SHOB, or diaphoresis.

## 2018-12-02 NOTE — ED Provider Notes (Signed)
[facility] Emergency Department Provider Note  ____________________________________________  Time seen: Approximately 7:42 AM  I have reviewed the triage vital signs and the nursing notes.   HISTORY  Chief Complaint Hypertension   HPI Roger Pennington is a 42 y.o. male with prior history of hypertension who presents for evaluation of a headache.  Patient reports that 8 months ago after losing a lot of weight his primary care doctor took him off of blood pressure medication.  Over the last 3 days patient has had a constant headache.  Headache started mild after work on Friday and he got progressively worse the following day.  No thunderclap headache.  He reports that yesterday the headache was severe.  He denies a history of headaches.  His mother bought him a blood pressure cuff and he noted that his blood pressure was elevated at home with systolics in the 150s and diastolics in the low 100s.  Today the headache is somewhat better but is still 8 out of 10, sharp, diffuse.  Yesterday patient had blurry vision when the headache was severe that has resolved today.  Also has felt dizzy intermittently yesterday.  He is also complaining of photophobia.  He denies neck stiffness.  Denies any family history of AVM or aneurysms.  He denies any history of migraine headaches in the past.  No chest pain or shortness of breath.  Past Medical History:  Diagnosis Date  . Tobacco abuse     Patient Active Problem List   Diagnosis Date Noted  . Severe recurrent major depression (HCC) 07/02/2017  . Tobacco use disorder 07/02/2017  . Cannabis use disorder, severe, dependence (HCC) 07/02/2017  . Upper GI bleed 02/08/2016    Past Surgical History:  Procedure Laterality Date  . ESOPHAGOGASTRODUODENOSCOPY (EGD) WITH PROPOFOL N/A 02/09/2016   Procedure: ESOPHAGOGASTRODUODENOSCOPY (EGD) WITH PROPOFOL;  Surgeon: Christena Deem, MD;  Location: [facility] ENDOSCOPY;  Service:  Endoscopy;  Laterality: N/A;    Prior to Admission medications   Medication Sig Start Date End Date Taking? Authorizing Provider  amLODipine (NORVASC) 5 MG tablet Take 1 tablet (5 mg total) by mouth once. 07/05/17 07/05/17  Jimmy Footman, MD  brompheniramine-pseudoephedrine-DM 30-2-10 MG/5ML syrup Take 5 mLs by mouth 4 (four) times daily as needed. Patient not taking: Reported on 12/02/2018 09/25/18   Tommi Rumps, PA-C  butalbital-acetaminophen-caffeine (FIORICET, ESGIC) [id number] MG tablet Take 1-2 tablets by mouth every 6 (six) hours as needed. 12/02/18 12/02/19  Nita Sickle, MD  hydrochlorothiazide (HYDRODIURIL) 25 MG tablet Take 1 tablet (25 mg total) by mouth daily. 12/02/18   Nita Sickle, MD    Allergies Patient has no known allergies.  Family History  Problem Relation Age of Onset  . Hypertension Father   . Hypertension Brother     Social History Social History   Tobacco Use  . Smoking status: Current Every Day Smoker  . Smokeless tobacco: Never Used  Substance Use Topics  . Alcohol use: Yes    Alcohol/week: 0.0 standard drinks    Comment: occ  . Drug use: No    Review of Systems  Constitutional: Negative for fever. Eyes: Negative for visual changes. ENT: Negative for sore throat. Neck: No neck pain  Cardiovascular: Negative for chest pain. Respiratory: Negative for shortness of breath. Gastrointestinal: Negative for abdominal pain, vomiting or diarrhea. Genitourinary: Negative for dysuria. Musculoskeletal: Negative for back pain. Skin: Negative for rash. Neurological: Negative for  weakness or numbness. + HA Psych: No SI or HI  ____________________________________________   PHYSICAL EXAM:  VITAL SIGNS: ED Triage Vitals  Enc Vitals Group     BP 12/02/18 0643 (!) 165/103     Pulse Rate 12/02/18 0643 78     Resp 12/02/18 0643 20     Temp 12/02/18 0643 98.1 F (36.7 C)     Temp Source 12/02/18 0643 Oral     SpO2 12/02/18  0643 99 %     Weight 12/02/18 0644 270 lb (122.5 kg)     Height 12/02/18 0644 6\' 2"  (1.88 m)     Head Circumference --      Peak Flow --      Pain Score 12/02/18 0644 10     Pain Loc --      Pain Edu? --      Excl. in GC? --     Constitutional: Alert and oriented, speaking on the phone in a dark room and in no apparent distress. HEENT:      Head: Normocephalic and atraumatic.         Eyes: Conjunctivae are normal. Sclera is non-icteric.       Mouth/Throat: Mucous membranes are moist.       Neck: Supple with no signs of meningismus. Cardiovascular: Regular rate and rhythm. No murmurs, gallops, or rubs. 2+ symmetrical distal pulses are present in all extremities. No JVD. Respiratory: Normal respiratory effort. Lungs are clear to auscultation bilaterally. No wheezes, crackles, or rhonchi.  Gastrointestinal: Soft, non tender, and non distended with positive bowel sounds. No rebound or guarding. Musculoskeletal: Nontender with normal range of motion in all extremities. No edema, cyanosis, or erythema of extremities. Neurologic: Normal speech and language. Face is symmetric. EOMI, PERRL, intact strength and sensation x4, no pronator drift. Skin: Skin is warm, dry and intact. No rash noted. Psychiatric: Mood and affect are normal. Speech and behavior are normal.  ____________________________________________   LABS (all labs ordered are listed, but only abnormal results are displayed)  Labs Reviewed  CBC WITH DIFFERENTIAL/PLATELET - Abnormal; Notable for the following components:      Result Value   MCH 25.3 (*)    All other components within normal limits  BASIC METABOLIC PANEL - Abnormal; Notable for the following components:   Glucose, Bld 131 (*)    Calcium 8.7 (*)    All other components within normal limits  TROPONIN I   ____________________________________________  EKG  ED ECG REPORT I, Nita Sickle, the attending physician, personally viewed and interpreted this  ECG.  Normal sinus rhythm, rate of 71, normal intervals, normal axis, no ST elevations or depressions.  Normal EKG.  Unchanged from prior. ____________________________________________  RADIOLOGY  I have personally reviewed the images performed during this visit and I agree with the Radiologist's read.   Interpretation by Radiologist:  Ct Head Wo Contrast  Result Date: 12/02/2018 CLINICAL DATA:  Headache for 3 days EXAM: CT HEAD WITHOUT CONTRAST TECHNIQUE: Contiguous axial images were obtained from the base of the skull through the vertex without intravenous contrast. COMPARISON:  None. FINDINGS: Brain: The ventricles are normal in size and configuration. There is no intracranial mass, hemorrhage, extra-axial fluid collection, or midline shift. Brain parenchyma appears unremarkable. No acute infarct evident. Vascular: No hyperdense vessel. There is no appreciable vascular calcification. Skull: The bony calvarium appears intact. Sinuses/Orbits: There is mucosal thickening and opacification in several ethmoid air cells. There is mucosal thickening in both posterior sphenoid sinus regions. Visualized orbits appear symmetric bilaterally. Other: Mastoids are hypoplastic bilaterally. IMPRESSION: Areas of  paranasal sinus disease. Hypoplastic mastoids. Study otherwise unremarkable. Electronically Signed   By: Bretta Bang III M.D.   On: 12/02/2018 07:41     ____________________________________________   PROCEDURES  Procedure(s) performed: None Procedures Critical Care performed:  None ____________________________________________   INITIAL IMPRESSION / ASSESSMENT AND PLAN / ED COURSE  42 y.o. male with prior history of hypertension who presents for evaluation of a headache.  Patient neurologically intact.  No prior history of headaches.  Severe headache for the last 3 days.  No thunderclap headache.  Low suspicion for subarachnoid hemorrhage.  Due to new headache head CT was done which is  unremarkable.  Most likely headache in the setting of uncontrolled elevated blood pressure.  Will start patient on hydrochlorothiazide.  Will give a migraine cocktail.  Will check labs and EKG to rule out endorgan damage.    _________________________ 10:20 AM on 12/02/2018 -----------------------------------------  Patient's blood pressure trending down hydrochlorothiazide.  Headaches markedly improved.  Continues to look well appearing and neurologically intact.  Will discharge home on HCTZ and Fioricet.  Discussed standard return precautions and close follow-up with primary care doctor.   As part of my medical decision making, I reviewed the following data within the electronic MEDICAL RECORD NUMBER Nursing notes reviewed and incorporated, Labs reviewed , EKG interpreted , Old chart reviewed, Radiograph reviewed , Notes from prior ED visits and [place] Controlled Substance Database    Pertinent labs & imaging results that were available during my care of the patient were reviewed by me and considered in my medical decision making (see chart for details).    ____________________________________________   FINAL CLINICAL IMPRESSION(S) / ED DIAGNOSES  Final diagnoses:  Essential hypertension  Acute nonintractable headache, unspecified headache type      NEW MEDICATIONS STARTED DURING THIS VISIT:  ED Discharge Orders         Ordered    hydrochlorothiazide (HYDRODIURIL) 25 MG tablet  Daily     12/02/18 1019    butalbital-acetaminophen-caffeine (FIORICET, ESGIC) 50-325-40 MG tablet  Every 6 hours PRN     12/02/18 1019           Note:  This document was prepared using Dragon voice recognition software and may include unintentional dictation errors.    Don Perking, [place], MD 12/02/18 1021

## 2019-06-03 ENCOUNTER — Encounter: Payer: Self-pay | Admitting: Emergency Medicine

## 2019-06-03 ENCOUNTER — Emergency Department
Admission: EM | Admit: 2019-06-03 | Discharge: 2019-06-04 | Disposition: A | Discharge status: Other Acute Inpatient Hospital | Payer: No Typology Code available for payment source | Attending: Emergency Medicine | Admitting: Emergency Medicine

## 2019-06-03 ENCOUNTER — Other Ambulatory Visit: Payer: Self-pay

## 2019-06-03 DIAGNOSIS — R45851 Suicidal ideations: Secondary | ICD-10-CM | POA: Insufficient documentation

## 2019-06-03 DIAGNOSIS — Y999 Unspecified external cause status: Secondary | ICD-10-CM | POA: Insufficient documentation

## 2019-06-03 DIAGNOSIS — Z20828 Contact with and (suspected) exposure to other viral communicable diseases: Secondary | ICD-10-CM | POA: Insufficient documentation

## 2019-06-03 DIAGNOSIS — F329 Major depressive disorder, single episode, unspecified: Secondary | ICD-10-CM | POA: Insufficient documentation

## 2019-06-03 DIAGNOSIS — Y929 Unspecified place or not applicable: Secondary | ICD-10-CM | POA: Insufficient documentation

## 2019-06-03 DIAGNOSIS — T50902A Poisoning by unspecified drugs, medicaments and biological substances, intentional self-harm, initial encounter: Secondary | ICD-10-CM

## 2019-06-03 DIAGNOSIS — X838XXA Intentional self-harm by other specified means, initial encounter: Secondary | ICD-10-CM | POA: Insufficient documentation

## 2019-06-03 DIAGNOSIS — Y939 Activity, unspecified: Secondary | ICD-10-CM | POA: Insufficient documentation

## 2019-06-03 DIAGNOSIS — Z915 Personal history of self-harm: Secondary | ICD-10-CM | POA: Insufficient documentation

## 2019-06-03 DIAGNOSIS — T391X2A Poisoning by 4-Aminophenol derivatives, intentional self-harm, initial encounter: Secondary | ICD-10-CM | POA: Insufficient documentation

## 2019-06-03 LAB — COMPREHENSIVE METABOLIC PANEL
ALT: 15 U/L (ref 0–44)
AST: 19 U/L (ref 15–41)
Albumin: 3.9 g/dL (ref 3.5–5.0)
Alkaline Phosphatase: 64 U/L (ref 38–126)
Anion gap: 10 (ref 5–15)
BUN: 11 mg/dL (ref 6–20)
CO2: 22 mmol/L (ref 22–32)
Calcium: 8.7 mg/dL — ABNORMAL LOW (ref 8.9–10.3)
Chloride: 107 mmol/L (ref 98–111)
Creatinine, Ser: 1.09 mg/dL (ref 0.61–1.24)
GFR calc Af Amer: >60 mL/min (ref >60)
GFR calc non Af Amer: >60 mL/min (ref >60)
Glucose, Bld: 176 mg/dL — ABNORMAL HIGH (ref 70–99)
Potassium: 3.8 mmol/L (ref 3.5–5.1)
Sodium: 139 mmol/L (ref 135–145)
Total Bilirubin: 0.6 mg/dL (ref 0.3–1.2)
Total Protein: 7.1 g/dL (ref 6.5–8.1)

## 2019-06-03 LAB — URINALYSIS, COMPLETE (UACMP) WITH MICROSCOPIC
Bacteria, UA: NONE SEEN
Bilirubin Urine: NEGATIVE
Glucose, UA: NEGATIVE mg/dL
Hgb urine dipstick: NEGATIVE
Ketones, ur: NEGATIVE mg/dL
Leukocytes,Ua: NEGATIVE
Nitrite: NEGATIVE
Protein, ur: NEGATIVE mg/dL
Specific Gravity, Urine: 1.006 (ref 1.005–1.030)
Squamous Epithelial / HPF: NONE SEEN (ref 0–5)
pH: 6 (ref 5.0–8.0)

## 2019-06-03 LAB — ETHANOL: Alcohol, Ethyl (B): 144 mg/dL — ABNORMAL HIGH (ref <10)

## 2019-06-03 LAB — URINE DRUG SCREEN, QUALITATIVE (ARMC ONLY)
Amphetamines, Ur Screen: NOT DETECTED
Barbiturates, Ur Screen: NOT DETECTED
Benzodiazepine, Ur Scrn: NOT DETECTED
Cannabinoid 50 Ng, Ur ~~LOC~~: POSITIVE — AB
Cocaine Metabolite,Ur ~~LOC~~: NOT DETECTED
MDMA (Ecstasy)Ur Screen: NOT DETECTED
Methadone Scn, Ur: NOT DETECTED
Opiate, Ur Screen: NOT DETECTED
Phencyclidine (PCP) Ur S: NOT DETECTED
Tricyclic, Ur Screen: NOT DETECTED

## 2019-06-03 LAB — CBC WITH DIFFERENTIAL/PLATELET
Abs Immature Granulocytes: 0.01 10*3/uL (ref 0.00–0.07)
Basophils Absolute: 0 10*3/uL (ref 0.0–0.1)
Basophils Relative: 1 %
Eosinophils Absolute: 0.2 10*3/uL (ref 0.0–0.5)
Eosinophils Relative: 4 %
HCT: 41.4 % (ref 39.0–52.0)
Hemoglobin: 12.8 g/dL — ABNORMAL LOW (ref 13.0–17.0)
Immature Granulocytes: 0 %
Lymphocytes Relative: 49 %
Lymphs Abs: 3 10*3/uL (ref 0.7–4.0)
MCH: 25.3 pg — ABNORMAL LOW (ref 26.0–34.0)
MCHC: 30.9 g/dL (ref 30.0–36.0)
MCV: 81.8 fL (ref 80.0–100.0)
Monocytes Absolute: 0.3 10*3/uL (ref 0.1–1.0)
Monocytes Relative: 5 %
Neutro Abs: 2.5 10*3/uL (ref 1.7–7.7)
Neutrophils Relative %: 41 %
Platelets: 226 10*3/uL (ref 150–400)
RBC: 5.06 MIL/uL (ref 4.22–5.81)
RDW: 12.8 % (ref 11.5–15.5)
WBC: 6 10*3/uL (ref 4.0–10.5)
nRBC: 0 % (ref 0.0–0.2)

## 2019-06-03 LAB — SALICYLATE LEVEL: Salicylate Lvl: <7 mg/dL (ref 2.8–30.0)

## 2019-06-03 LAB — ACETAMINOPHEN LEVEL: Acetaminophen (Tylenol), Serum: 64 ug/mL — ABNORMAL HIGH (ref 10–30)

## 2019-06-03 MED ORDER — ONDANSETRON HCL 4 MG/2ML IJ SOLN
INTRAMUSCULAR | Status: AC
  Administered 2019-06-03: 4 mg via INTRAVENOUS
  Filled 2019-06-03: qty 2

## 2019-06-03 MED ORDER — ONDANSETRON HCL 4 MG/2ML IJ SOLN
4.0000 mg | Freq: Once | INTRAMUSCULAR | Status: AC
  Administered 2019-06-03: 4 mg via INTRAVENOUS

## 2019-06-03 NOTE — ED Provider Notes (Addendum)
[facility] Emergency Department Provider Note   ____________________________________________   First MD Initiated Contact with Patient 06/03/19 2024     (approximate)  I have reviewed the triage vital signs and the nursing notes.   HISTORY  Chief Complaint Drug Overdose History mostly obtained from police.  Patient is too sedated to answer many questions.  He can just tell me he denies drinking some alcohol and took some blue pills.  The Tylenol PM that he has with him as blue.   HPI Roger Pennington is a 43 y.o. male who called the police or friend called the police.  Patient has been taking Tylenol PM generic since about 5:00 and he drank 1/5 of vodka because his friend who died a year ago from cancer told him he wanted to see him.  Patient was attempting to go see his good friend.  Patient may have taken 24 Tylenol PM's.         Past Medical History:  Diagnosis Date  . Tobacco abuse     Patient Active Problem List   Diagnosis Date Noted  . Severe recurrent major depression (HCC) 07/02/2017  . Tobacco use disorder 07/02/2017  . Cannabis use disorder, severe, dependence (HCC) 07/02/2017  . Upper GI bleed 02/08/2016    Past Surgical History:  Procedure Laterality Date  . ESOPHAGOGASTRODUODENOSCOPY (EGD) WITH PROPOFOL N/A 02/09/2016   Procedure: ESOPHAGOGASTRODUODENOSCOPY (EGD) WITH PROPOFOL;  Surgeon: Christena DeemMartin U Skulskie, MD;  Location[facility] ENDOSCOPY;  Service: Endoscopy;  Laterality: N/A;    Prior to Admission medications   Medication Sig Start Date End Date Taking? Authorizing Provider  amLODipine (NORVASC) 5 MG tablet Take 1 tablet (5 mg total) by mouth once. 07/05/17 07/05/17  Jimmy FootmanHernandez-Gonzalez, Andrea, MD  brompheniramine-pseudoephedrine-DM 30-2-10 MG/5ML syrup Take 5 mLs by mouth 4 (four) times daily as needed. Patient not taking: Reported on 12/02/2018 09/25/18   Tommi RumpsSummers, Rhonda L, PA-C  butalbital-acetaminophen-caffeine (FIORICET,  ESGIC) [id number] MG tablet Take 1-2 tablets by mouth every 6 (six) hours as needed. 12/02/18 12/02/19  Nita SickleVeronese, [place], MD  hydrochlorothiazide (HYDRODIURIL) 25 MG tablet Take 1 tablet (25 mg total) by mouth daily. 12/02/18   Nita SickleVeronese, [place], MD    Allergies Patient has no known allergies.  Family History  Problem Relation Age of Onset  . Hypertension Father   . Hypertension Brother     Social History Social History   Tobacco Use  . Smoking status: Current Every Day Smoker  . Smokeless tobacco: Never Used  Substance Use Topics  . Alcohol use: Yes    Alcohol/week: 0.0 standard drinks    Comment: occ  . Drug use: No    Review of Systems Patient too sedated to answer these questions  ____________________________________________   PHYSICAL EXAM:  VITAL SIGNS: ED Triage Vitals  Enc Vitals Group     BP 06/03/19 2018 (!) 136/94     Pulse Rate 06/03/19 2018 66     Resp 06/03/19 2018 18     Temp 06/03/19 2018 98.4 F (36.9 C)     Temp Source 06/03/19 2018 Oral     SpO2 06/03/19 2016 98 %     Weight --      Height --      Head Circumference --      Peak Flow --      Pain Score 06/03/19 2019 0     Pain Loc --      Pain Edu? --      Excl. in GC? --  Constitutional: Patient is sleepy but arousable partially. Eyes: Conjunctivae are normal.  Pupils are equal round small and reactive Head: Atraumatic.  Old scar on the right side of his face well-healed Nose: No congestion/rhinnorhea. Mouth/Throat: Mucous membranes are moist.  Oropharynx non-erythematous. Neck: No stridor.  Cardiovascular: Normal rate, regular rhythm. Grossly normal heart sounds.  Good peripheral circulation. Respiratory: Normal respiratory effort.  No retractions. Lungs CTAB. Gastrointestinal: Soft and nontender. No distention. No abdominal bruits. No CVA tenderness. Musculoskeletal: No lower extremity tenderness nor edema.   Neurologic: Sedated speech and language. No gross focal neurologic  deficits are appreciated.  Skin:  Skin is warm, dry and intact. No rash noted.   ____________________________________________   LABS (all labs ordered are listed, but only abnormal results are displayed)  Labs Reviewed  COMPREHENSIVE METABOLIC PANEL - Abnormal; Notable for the following components:      Result Value   Glucose, Bld 176 (*)    Calcium 8.7 (*)    All other components within normal limits  CBC WITH DIFFERENTIAL/PLATELET - Abnormal; Notable for the following components:   Hemoglobin 12.8 (*)    MCH 25.3 (*)    All other components within normal limits  URINE DRUG SCREEN, QUALITATIVE ([facility] ONLY) - Abnormal; Notable for the following components:   Cannabinoid 50 Ng, Ur [place] POSITIVE (*)    All other components within normal limits  URINALYSIS, COMPLETE (UACMP) WITH MICROSCOPIC - Abnormal; Notable for the following components:   Color, Urine STRAW (*)    APPearance CLEAR (*)    All other components within normal limits  ETHANOL - Abnormal; Notable for the following components:   Alcohol, Ethyl (B) 144 (*)    All other components within normal limits  ACETAMINOPHEN LEVEL - Abnormal; Notable for the following components:   Acetaminophen (Tylenol), Serum 64 (*)    All other components within normal limits  SALICYLATE LEVEL  ACETAMINOPHEN LEVEL   ____________________________________________  EKG  EKG read interpreted by me shows normal sinus rhythm rate of 66 normal axis no acute ST-T wave changes ____________________________________________  RADIOLOGY  ED MD interpretation:   Official radiology report(s): No results found.  ____________________________________________   PROCEDURES  Procedure(s) performed (including Critical Care):  Procedures   ____________________________________________   INITIAL IMPRESSION / ASSESSMENT AND PLAN / ED COURSE  [name] was evaluated in Emergency Department on 06/03/2019 for the symptoms described in the  history of present illness. He was evaluated in the context of the global COVID-19 pandemic, which necessitated consideration that the patient might be at risk for infection with the SARS-CoV-2 virus that causes COVID-19. Institutional protocols and algorithms that pertain to the evaluation of patients at risk for COVID-19 are in a state of rapid change based on information released by regulatory bodies including the CDC and federal and state organizations. These policies and algorithms were followed during the patient's care in the ED.   Discussed patient with poison control.  Poison control wants another Tylenol level done.  This will be in a couple hours.  Once this is done we will be able to tell what his level is doing and decide whether or not he needs to be treated.  I have IVCed this patient.  ----------------------------------------- 11:26 PM on 06/03/2019 -----------------------------------------  Patient signed out to Dr. Kerman Passey       ____________________________________________   FINAL CLINICAL IMPRESSION(S) / ED DIAGNOSES  Final diagnoses:  Intentional drug overdose, initial encounter [facility])     ED Discharge Orders  None       Note:  This document was prepared using Dragon voice recognition software and may include unintentional dictation errors.    Arnaldo Natal, MD 06/03/19 2257    Arnaldo Natal, MD 06/03/19 2326

## 2019-06-03 NOTE — ED Triage Notes (Signed)
Patient arrived by [facility] from home. EMS states patient drank 1/5th vodka and took 30 tylenol PM tablets. When counting medication in bottle there is 26 tablets in bottle. Patient unable to tell this writer how much medication he ingested. Patient when first coming to room was stating he had to pee. This Probation officer and EMS assisted patient to toilet and to urinate in urinal.

## 2019-06-03 NOTE — ED Notes (Signed)
TTS is unable to assess, as the patient is not alert and is unable to engage at this time.

## 2019-06-03 NOTE — ED Provider Notes (Signed)
Repeat EKG shows normal sinus rhythm rate of 67 normal axis essentially normal EKG no significant change from EKG #1 including in the QRS and QTc durations.  QRS duration is 97 ms on both QTC was 446 on the first 1 and 447 on the second.   Nena Polio, MD 06/03/19 2329

## 2019-06-04 ENCOUNTER — Encounter: Payer: Self-pay | Admitting: Behavioral Health

## 2019-06-04 ENCOUNTER — Other Ambulatory Visit: Payer: Self-pay

## 2019-06-04 ENCOUNTER — Inpatient Hospital Stay
Admission: AD | Admit: 2019-06-04 | Discharge: 2019-06-07 | DRG: 885 | Disposition: A | Discharge status: Home / Self Care | Payer: No Typology Code available for payment source | Source: Intra-hospital | Attending: Psychiatry | Admitting: Psychiatry

## 2019-06-04 DIAGNOSIS — T1491XA Suicide attempt, initial encounter: Secondary | ICD-10-CM

## 2019-06-04 DIAGNOSIS — F1290 Cannabis use, unspecified, uncomplicated: Secondary | ICD-10-CM | POA: Diagnosis present

## 2019-06-04 DIAGNOSIS — R45851 Suicidal ideations: Secondary | ICD-10-CM

## 2019-06-04 DIAGNOSIS — T391X2A Poisoning by 4-Aminophenol derivatives, intentional self-harm, initial encounter: Secondary | ICD-10-CM | POA: Diagnosis present

## 2019-06-04 DIAGNOSIS — F329 Major depressive disorder, single episode, unspecified: Secondary | ICD-10-CM

## 2019-06-04 DIAGNOSIS — F1020 Alcohol dependence, uncomplicated: Secondary | ICD-10-CM

## 2019-06-04 DIAGNOSIS — F4010 Social phobia, unspecified: Secondary | ICD-10-CM

## 2019-06-04 DIAGNOSIS — Z915 Personal history of self-harm: Secondary | ICD-10-CM | POA: Diagnosis not present

## 2019-06-04 DIAGNOSIS — I10 Essential (primary) hypertension: Secondary | ICD-10-CM | POA: Diagnosis present

## 2019-06-04 DIAGNOSIS — F332 Major depressive disorder, recurrent severe without psychotic features: Secondary | ICD-10-CM | POA: Diagnosis present

## 2019-06-04 DIAGNOSIS — Z8249 Family history of ischemic heart disease and other diseases of the circulatory system: Secondary | ICD-10-CM

## 2019-06-04 DIAGNOSIS — F32A Depression, unspecified: Secondary | ICD-10-CM

## 2019-06-04 LAB — ACETAMINOPHEN LEVEL: Acetaminophen (Tylenol), Serum: 49 ug/mL — ABNORMAL HIGH (ref 10–30)

## 2019-06-04 LAB — SARS CORONAVIRUS 2 BY RT PCR (HOSPITAL ORDER, PERFORMED IN ~~LOC~~ HOSPITAL LAB): SARS Coronavirus 2: NEGATIVE

## 2019-06-04 MED ORDER — MAGNESIUM HYDROXIDE 400 MG/5ML PO SUSP: 30.0000 mL | Freq: Every day | ORAL | Status: DC | PRN

## 2019-06-04 MED ORDER — AMLODIPINE BESYLATE 5 MG PO TABS
5.0000 mg | ORAL_TABLET | Freq: Once | ORAL | Status: AC
  Administered 2019-06-04: 5 mg via ORAL
  Filled 2019-06-04: qty 1

## 2019-06-04 MED ORDER — BUPROPION HCL ER (XL) 150 MG PO TB24
150.0000 mg | ORAL_TABLET | Freq: Every day | ORAL | Status: DC
  Administered 2019-06-04: 150 mg via ORAL
  Filled 2019-06-04: qty 1

## 2019-06-04 MED ORDER — HYDROCHLOROTHIAZIDE 25 MG PO TABS
25.0000 mg | ORAL_TABLET | Freq: Every day | ORAL | Status: DC
  Administered 2019-06-04: 25 mg via ORAL
  Filled 2019-06-04: qty 1

## 2019-06-04 MED ORDER — LORAZEPAM 1 MG PO TABS
1.0000 mg | ORAL_TABLET | Freq: Once | ORAL | Status: AC
  Administered 2019-06-04: 1 mg via ORAL
  Filled 2019-06-04: qty 1

## 2019-06-04 MED ORDER — PSEUDOEPH-BROMPHEN-DM 30-2-10 MG/5ML PO SYRP: 5.0000 mL | ORAL_SOLUTION | Freq: Four times a day (QID) | ORAL | Status: DC | PRN

## 2019-06-04 MED ORDER — BUTALBITAL-APAP-CAFFEINE 50-325-40 MG PO TABS: 1.0000 | ORAL_TABLET | Freq: Four times a day (QID) | ORAL | Status: DC | PRN

## 2019-06-04 MED ORDER — ALUM & MAG HYDROXIDE-SIMETH 200-200-20 MG/5ML PO SUSP: 30.0000 mL | ORAL | Status: DC | PRN

## 2019-06-04 NOTE — ED Notes (Signed)
Hourly rounding reveals patient sleeping in room. No complaints, stable, in no acute distress. Q15 minute rounds and monitoring via Rover and Officer to continue.  

## 2019-06-04 NOTE — ED Notes (Signed)
Patient moved from 26 to 40. Roger Severin RN received report. Patient Alert and oriented x 4.

## 2019-06-04 NOTE — ED Notes (Signed)
Pt. Transferred from room 26 to 23. Report to include Situation, Background, Assessment and Recommendations from [employer]. Pt. Oriented to Quad including Q15 minute rounds as well as Engineer, drilling for their protection. Patient is alert and oriented, warm and dry in no acute distress. Patient denies SI, HI, and AVH. Pt. Encouraged to let me know if needs arise.

## 2019-06-04 NOTE — ED Provider Notes (Signed)
-----------------------------------------   9:48 AM on 06/04/2019 -----------------------------------------  Per TTS, patient to be admitted to inpatient behavioral health.  COVID-19 swab was ordered but the patient is declining.  He states he had one done a month ago and it hurts, so he does not want it again.  I attempted to address his concerns, but he still declines.  I will defer to the behavioral health team as to how to proceed.   Arta Silence, MD 06/04/19 [phone]

## 2019-06-04 NOTE — BH Assessment (Signed)
Assessment Note  Roger Pennington is an 43 y.o. male who presents to the ER via EMS after family called out of concern. Patient admits to drinking alcohol and ingesting tylenol with the the intentions of ending his life. Patient was unable to remember many of the events that lead to him coming to the ER. However, he does remember going to his parents house to eat breakfast with them. His mother had a dream he had died and he wanted to reassure her he was okay. However, latter that day he started "feeling down" and he usually talk to certain people and family members to help him when he's "like that." When he called them, they were unable to talk because they were either at work or too busy. He started drinking and he remembers calling his daughter to tell her he loves her. He also reports of "taking blue pills" while he was drinking.  The rest of the day he does not remember.  When asked about his depression, patient reports of having no idea why he's currently depressed. "I don't know. I get sad and then I just stay like that." He states since he's lived alone, it's difficult for him to sleep. "I stay up till about two or three in the morning, even when I know I have to go to work. My alarm goes off and I go to work..." This have been ongoing for about a year. He denies any self-injurious behaviors or risks.  Patient has had a previous suicide attempt, 06/2017. During that time he and his girlfriend broke up, one of his aunts and two of his closes friends passed away. He currently receives outpatient treatment with [facility]. He see's a therapist but he's unable to seen them lately due to his work schedule.   During the interview, the patient was calm, cooperative and pleasant. He was able to provide appropriate answers to the questions.  He denies history of violence and aggression. He also denies any involvement with the legal system. At times the patient became tearful while he was talking.  He denies HI and  AV/H.  Diagnosis: Major Depression  Past Medical History:  Past Medical History:  Diagnosis Date  . Tobacco abuse     Past Surgical History:  Procedure Laterality Date  . ESOPHAGOGASTRODUODENOSCOPY (EGD) WITH PROPOFOL N/A 02/09/2016   Procedure: ESOPHAGOGASTRODUODENOSCOPY (EGD) WITH PROPOFOL;  Surgeon: Lollie Sails, MD;  Location: [facility] ENDOSCOPY;  Service: Endoscopy;  Laterality: N/A;    Family History:  Family History  Problem Relation Age of Onset  . Hypertension Father   . Hypertension Brother     Social History:  reports that he has been smoking. He has never used smokeless tobacco. He reports current alcohol use. He reports that he does not use drugs.  Additional Social History:  Alcohol / Drug Use Pain Medications: See PTA Prescriptions: See PTA Over the Counter: See PTA History of alcohol / drug use?: Yes Longest period of sobriety (when/how long): Unable to quantify Substance #1 Name of Substance 1: Cannabis Substance #2 Name of Substance 2: Alcohol  CIWA: CIWA-Ar BP: 101/72 Pulse Rate: 65 COWS:    Allergies: No Known Allergies  Home Medications: (Not in a hospital admission)   OB/GYN Status:  No LMP for male patient.  General Assessment Data Location of Assessment: [facility] ED TTS Assessment: In system Is this a Tele or Face-to-Face Assessment?: Face-to-Face Is this an Initial Assessment or a Re-assessment for this encounter?: Initial Assessment Patient Accompanied by::  N/A Language Other than English: No Living Arrangements: Other (Comment)(Private Home) What gender do you identify as?: Male Marital status: Single Pregnancy Status: No Living Arrangements: Alone Can pt return to current living arrangement?: Yes Admission Status: Involuntary Petitioner: ED Attending Is patient capable of signing voluntary admission?: No(Under IVC) Referral Source: Self/Family/Friend Insurance type: None  Medical Screening Exam [facility] ONLY) Medical Exam  completed: Yes  Crisis Care Plan Living Arrangements: Alone Legal Guardian: Other:(Self) Name of Psychiatrist: [facility] Name of Therapist: [facility]  Education Status Is patient currently in school?: No Is the patient employed, unemployed or receiving disability?: Employed  Risk to self with the past 6 months Suicidal Ideation: Yes-Currently Present Has patient been a risk to self within the past 6 months prior to admission? : Yes Suicidal Intent: Yes-Currently Present Has patient had any suicidal intent within the past 6 months prior to admission? : Yes Is patient at risk for suicide?: Yes Suicidal Plan?: Yes-Currently Present Has patient had any suicidal plan within the past 6 months prior to admission? : Yes Specify Current Suicidal Plan: Overdose on medications Access to Means: Yes Specify Access to Suicidal Means: Overdose on medciations What has been your use of drugs/alcohol within the last 12 months?: Cannabis & Alcohol Previous Attempts/Gestures: Yes How many times?: 1 Other Self Harm Risks: Reports of none Triggers for Past Attempts: Spouse contact, Other (Comment)(Loss) Intentional Self Injurious Behavior: None Family Suicide History: No Recent stressful life event(s): Loss (Comment), Other (Comment) Persecutory voices/beliefs?: No Depression: Yes Depression Symptoms: Insomnia, Tearfulness, Isolating, Fatigue, Loss of interest in usual pleasures, Feeling worthless/self pity Substance abuse history and/or treatment for substance abuse?: Yes Suicide prevention information given to non-admitted patients: Not applicable  Risk to Others within the past 6 months Homicidal Ideation: No Does patient have any lifetime risk of violence toward others beyond the six months prior to admission? : No Thoughts of Harm to Others: No Current Homicidal Intent: No Current Homicidal Plan: No Access to Homicidal Means: No Identified Victim: Reports of none History of harm to others?:  No Assessment of Violence: None Noted Violent Behavior Description: Reports of none Does patient have access to weapons?: No Criminal Charges Pending?: No Does patient have a court date: No Is patient on probation?: No  Psychosis Hallucinations: None noted Delusions: None noted  Mental Status Report Appearance/Hygiene: Unremarkable, In scrubs Eye Contact: Good Motor Activity: Freedom of movement, Unremarkable Speech: Logical/coherent, Unremarkable Level of Consciousness: Alert Mood: Depressed, Sad, Pleasant Affect: Appropriate to circumstance, Depressed, Sad Anxiety Level: Minimal Thought Processes: Coherent, Relevant Judgement: Unimpaired Orientation: Person, Place, Time, Situation, Appropriate for developmental age Obsessive Compulsive Thoughts/Behaviors: Minimal  Cognitive Functioning Concentration: Normal Memory: Remote Intact, Recent Impaired Is patient IDD: No Insight: Poor Impulse Control: Poor Appetite: Fair Have you had any weight changes? : No Change Sleep: No Change Total Hours of Sleep: 5 Vegetative Symptoms: None  ADLScreening [facility] Assessment Services) Patient's cognitive ability adequate to safely complete daily activities?: Yes Patient able to express need for assistance with ADLs?: Yes Independently performs ADLs?: Yes (appropriate for developmental age)  Prior Inpatient Therapy Prior Inpatient Therapy: Yes Prior Therapy Dates: 06/2017 Prior Therapy [facility]ilty[facility]Provider([facility] Reason for Treatment: Depression  Prior Outpatient Therapy Prior Outpatient Therapy: Yes Prior Therapy Dates: Current Prior Therapy Facilty/Provider(s): [facility] Reason for Treatment: Depression Does patient have an ACCT team?: No Does patient have Intensive In-House Services?  : No Does patient have [facility] services? : No Does patient have P4CC services?: No  ADL Screening (condition  at time of admission) Patient's cognitive ability adequate to safely complete daily  activities?: Yes Is the patient deaf or have difficulty hearing?: No Does the patient have difficulty seeing, even when wearing glasses/contacts?: No Does the patient have difficulty concentrating, remembering, or making decisions?: No Patient able to express need for assistance with ADLs?: Yes Does the patient have difficulty dressing or bathing?: No Independently performs ADLs?: Yes (appropriate for developmental age) Does the patient have difficulty walking or climbing stairs?: No Weakness of Legs: None Weakness of Arms/Hands: None  Home Assistive Devices/Equipment Home Assistive Devices/Equipment: None  Therapy Consults (therapy consults require a physician order) PT Evaluation Needed: No OT Evalulation Needed: No SLP Evaluation Needed: No Abuse/Neglect Assessment (Assessment to be complete while patient is alone) Abuse/Neglect Assessment Can Be Completed: Yes Physical Abuse: Denies Verbal Abuse: Denies Sexual Abuse: Denies Exploitation of patient/patient's resources: Denies Self-Neglect: Denies Values / Beliefs Cultural Requests During Hospitalization: None Spiritual Requests During Hospitalization: None Consults Spiritual Care Consult Needed: No Social Work Consult Needed: No Merchant navy officer (For Healthcare) Does Patient Have a Medical Advance Directive?: Unable to assess, patient is non-responsive or altered mental status       Child/Adolescent Assessment Running Away Risk: Denies(Patient is an adult)  Disposition:  Disposition Initial Assessment Completed for this Encounter: Yes  On Site Evaluation by:   Reviewed with Physician:    Lilyan Gilford MS, LCAS, [facility] NCC, CCSI Therapeutic Triage Specialist 06/04/2019 10:46 AM

## 2019-06-04 NOTE — ED Notes (Signed)
Warm wash cloth was given to patient for his face and hands, and 2 cups of ginger ale

## 2019-06-04 NOTE — ED Notes (Signed)
Per poison control patient is cleared medically.

## 2019-06-04 NOTE — Tx Team (Signed)
Initial Treatment Plan 06/04/2019 4:37 PM Roger Pennington [id number]    PATIENT STRESSORS: Financial difficulties Substance abuse   PATIENT STRENGTHS: Ability for insight Communication skills Supportive family/friends   PATIENT IDENTIFIED PROBLEMS: Suicidal 06/04/2019  Depression 06/04/2019  Lonely  06/04/2019  Financial 06/04/2019               DISCHARGE CRITERIA:  Ability to meet basic life and health needs Improved stabilization in mood, thinking, and/or behavior  PRELIMINARY DISCHARGE PLAN: Outpatient therapy Return to previous living arrangement  PATIENT/FAMILY INVOLVEMENT: This treatment plan has been presented to and reviewed with the patient, Roger Pennington, and/or family member, ,Suicidal   The patient and family have been given the opportunity to ask questions and make suggestions.  Leodis Liverpool, RN 06/04/2019, 4:37 PM

## 2019-06-04 NOTE — BH Assessment (Signed)
Patient is to be admitted to [facility] by Nurse Practitioner Geni Bers.  Attending Physician will be Dr. Weber Cooks.   Patient has been assigned to room 323, by [facility] Charge Nurse [place] staff is aware of the admission:  Lattie Haw, ER Secretary    Dr. Cherylann Banas,, ER MD   Berton Lan, Patient's Nurse   Gust Rung., Patient Access.
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
## 2019-06-04 NOTE — Consult Note (Signed)
[facility] Face-to-Face Psychiatry Consult   Reason for Consult: Suicidal attempt Referring Physician: Dr. Juliette AlcideMelinda Patient Identification: Roger SizerLarry Lewis Beaumier MRN:  [id number] Principal Diagnosis: <principal problem not specified> Diagnosis:  Active Problems:   Intentional acetaminophen overdose (HCC)   Total Time spent with patient: 1 hour  Subjective: "I took Tylenol because I had a headache." Roger SizerLarry Lewis Pennington is a 43 y.o. male patient presented to [facility] ED via law enforcement under involuntary commitment status (IVC).  "No, I took Tylenol because I had a headache." The patient was seen face-to-face by this provider; chart reviewed and consulted with Dr. Lenard LancePaduchowski on 06/04/2019 due to the care of the patient. It was discussed with the provider that the patient does meet criteria to be admitted to the psychiatric inpatient unit once a bed becomes available.  On evaluation the patient is alert and oriented x4, calm and cooperative, but has to be aroused in order to get him to answer questions.  The patient mood-congruent with affect. The patient does not appear to be responding to internal or external stimuli. Neither is the patient presenting with any delusional thinking. The patient denies auditory or visual hallucinations. The patient denies any suicidal, homicidal, or self-harm ideations. The patient is not presenting with any psychotic or paranoid behaviors. During an encounter with the patient, he was able to answer questions appropriately.  Collateral was obtained by the patient's brother Mr. Roger Pennington [phone] who stated that he is unsure as to what transpired for his brother to attempt to take his life.  He stated "I believe.  It has to do with him losing a lot of his friends over the years."  He discussed that his brother has had many friends who has passed on over the years.  He states that he believes his brother became overcome of losing these people.  Mr. Roger Pennington,  discussed that he is unaware of any major situations in the patient's life that will have him want to commit suicide.  He discussed that his brother has not spoken to him in depth about any situations in his life that will make him fall victim to a behavior of intentional overdosing   Plan: The patient is a safety risk to self and does require psychiatric inpatient admission for stabilization and treatment. Patient medications needs to reconcile before prescribing  HPI: Per Dr. Juliette AlcideMelinda; Roger SizerLarry Lewis Caniglia is a 43 y.o. male who called the police or friend called the police.  Patient has been taking Tylenol PM generic since about 5:00 and he drank 1/5 of vodka because his friend who died a year ago from cancer told him he wanted to see him.  Patient was attempting to go see his good friend.  Patient may have taken 24 Tylenol PM's.      Past Psychiatric History:  Severe recurrent major depression (HCC) Cannabis use disorder, severe, dependence (HCC)  Risk to Self:  Yes Risk to Others:  No Prior Inpatient Therapy:  Unknown Prior Outpatient Therapy:  Yes  Past Medical History:  Past Medical History:  Diagnosis Date  . Tobacco abuse     Past Surgical History:  Procedure Laterality Date  . ESOPHAGOGASTRODUODENOSCOPY (EGD) WITH PROPOFOL N/A 02/09/2016   Procedure: ESOPHAGOGASTRODUODENOSCOPY (EGD) WITH PROPOFOL;  Surgeon: Christena DeemMartin U Skulskie, MD;  [facility] ENDOSCOPY;  Service: Endoscopy;  Laterality: N/A;   Family History:  Family History  Problem Relation Age of Onset  . Hypertension Father   . Hypertension Brother    Family  Psychiatric  History:  None provided Social History:  Social History   Substance and Sexual Activity  Alcohol Use Yes  . Alcohol/week: 0.0 standard drinks   Comment: occ     Social History   Substance and Sexual Activity  Drug Use No    Social History   Socioeconomic History  . Marital status: Single    Spouse name: Not on file  . Number of  children: Not on file  . Years of education: Not on file  . Highest education level: Not on file  Occupational History  . Not on file  Social Needs  . Financial resource strain: Not on file  . Food insecurity    Worry: Not on file    Inability: Not on file  . Transportation needs    Medical: Not on file    Non-medical: Not on file  Tobacco Use  . Smoking status: Current Every Day Smoker  . Smokeless tobacco: Never Used  Substance and Sexual Activity  . Alcohol use: Yes    Alcohol/week: 0.0 standard drinks    Comment: occ  . Drug use: No  . Sexual activity: Not on file  Lifestyle  . Physical activity    Days per week: Not on file    Minutes per session: Not on file  . Stress: Not on file  Relationships  . Social Herbalist on phone: Not on file    Gets together: Not on file    Attends religious service: Not on file    Active member of club or organization: Not on file    Attends meetings of clubs or organizations: Not on file    Relationship status: Not on file  Other Topics Concern  . Not on file  Social History Narrative  . Not on file   Additional Social History:    Allergies:  No Known Allergies  Labs:  Results for orders placed or performed during the hospital encounter of 06/03/19 (from the past 48 hour(s))  Ethanol     Status: Abnormal   Collection Time: 06/03/19  8:20 PM  Result Value Ref Range   Alcohol, Ethyl (B) 144 (H) <10 mg/dL    Comment: (NOTE) Lowest detectable limit for serum alcohol is 10 mg/dL. For medical purposes only. Performed at [facility], [address] [place], [place] [address]   Acetaminophen level     Status: Abnormal   Collection Time: 06/03/19  8:20 PM  Result Value Ref Range   Acetaminophen (Tylenol), Serum 64 (H) 10 - 30 ug/mL    Comment: (NOTE) Therapeutic concentrations vary significantly. A range of 10-30 ug/mL  may be an effective concentration for many patients. However, some  are best treated  at concentrations outside of this range. Acetaminophen concentrations >150 ug/mL at 4 hours after ingestion  and >50 ug/mL at 12 hours after ingestion are often associated with  toxic reactions. Performed at [facility], [address] [place], [place] [address]   Salicylate level     Status: None   Collection Time: 06/03/19  8:20 PM  Result Value Ref Range   Salicylate Lvl <2.7 2.8 - 30.0 mg/dL    Comment: Performed at [facility], [address] [place], [place] [address]  Comprehensive metabolic panel     Status: Abnormal   Collection Time: 06/03/19  8:29 PM  Result Value Ref Range   Sodium 139 135 - 145 mmol/L   Potassium 3.8 3.5 - 5.1 mmol/L  Comment: HEMOLYSIS AT THIS LEVEL MAY AFFECT RESULT   Chloride 107 98 - 111 mmol/L   CO2 22 22 - 32 mmol/L   Glucose, Bld 176 (H) 70 - 99 mg/dL   BUN 11 6 - 20 mg/dL   Creatinine, Ser 4.74 0.61 - 1.24 mg/dL   Calcium 8.7 (L) 8.9 - 10.3 mg/dL   Total Protein 7.1 6.5 - 8.1 g/dL   Albumin 3.9 3.5 - 5.0 g/dL   AST 19 15 - 41 U/L   ALT 15 0 - 44 U/L   Alkaline Phosphatase 64 38 - 126 U/L   Total Bilirubin 0.6 0.3 - 1.2 mg/dL   GFR calc non Af Amer >60 >60 mL/min   GFR calc Af Amer >60 >60 mL/min   Anion gap 10 5 - 15    Comment: Performed at [facility], [address] [place], [place] [address]  CBC with Differential     Status: Abnormal   Collection Time: 06/03/19  8:29 PM  Result Value Ref Range   WBC 6.0 4.0 - 10.5 K/uL   RBC 5.06 4.22 - 5.81 MIL/uL   Hemoglobin 12.8 (L) 13.0 - 17.0 g/dL   HCT 38.7 56.4 - 33.2 %   MCV 81.8 80.0 - 100.0 fL   MCH 25.3 (L) 26.0 - 34.0 pg   MCHC 30.9 30.0 - 36.0 g/dL   RDW 95.1 88.4 - 16.6 %   Platelets 226 150 - 400 K/uL   nRBC 0.0 0.0 - 0.2 %   Neutrophils Relative % 41 %   Neutro Abs 2.5 1.7 - 7.7 K/uL   Lymphocytes Relative 49 %   Lymphs Abs 3.0 0.7 - 4.0 K/uL   Monocytes Relative 5 %   Monocytes Absolute 0.3 0.1 - 1.0 K/uL   Eosinophils Relative 4 %    Eosinophils Absolute 0.2 0.0 - 0.5 K/uL   Basophils Relative 1 %   Basophils Absolute 0.0 0.0 - 0.1 K/uL   Immature Granulocytes 0 %   Abs Immature Granulocytes 0.01 0.00 - 0.07 K/uL    Comment: Performed at [facility], [address], [place], [place] [address]  Urine Drug Screen, Qualitative     Status: Abnormal   Collection Time: 06/03/19  8:29 PM  Result Value Ref Range   Tricyclic, Ur Screen NONE DETECTED NONE DETECTED   Amphetamines, Ur Screen NONE DETECTED NONE DETECTED   MDMA (Ecstasy)Ur Screen NONE DETECTED NONE DETECTED   Cocaine Metabolite,Ur [place] NONE DETECTED NONE DETECTED   Opiate, Ur Screen NONE DETECTED NONE DETECTED   Phencyclidine (PCP) Ur S NONE DETECTED NONE DETECTED   Cannabinoid 50 Ng, Ur [place] POSITIVE (A) NONE DETECTED   Barbiturates, Ur Screen NONE DETECTED NONE DETECTED   Benzodiazepine, Ur Scrn NONE DETECTED NONE DETECTED   Methadone Scn, Ur NONE DETECTED NONE DETECTED    Comment: (NOTE) Tricyclics + metabolites, urine    Cutoff 1000 ng/mL Amphetamines + metabolites, urine  Cutoff 1000 ng/mL MDMA (Ecstasy), urine              Cutoff 500 ng/mL Cocaine Metabolite, urine          Cutoff 300 ng/mL Opiate + metabolites, urine        Cutoff 300 ng/mL Phencyclidine (PCP), urine         Cutoff 25 ng/mL Cannabinoid, urine                 Cutoff 50 ng/mL Barbiturates + metabolites, urine  Cutoff 200 ng/mL Benzodiazepine,  urine              Cutoff 200 ng/mL Methadone, urine                   Cutoff 300 ng/mL The urine drug screen provides only a preliminary, unconfirmed analytical test result and should not be used for non-medical purposes. Clinical consideration and professional judgment should be applied to any positive drug screen result due to possible interfering substances. A more specific alternate chemical method must be used in order to obtain a confirmed analytical result. Gas chromatography / mass spectrometry (GC/MS) is the  preferred confirmat ory method. Performed at [facility], [address] [place], [place] [address]   Urinalysis, Complete w Microscopic     Status: Abnormal   Collection Time: 06/03/19  8:29 PM  Result Value Ref Range   Color, Urine STRAW (A) YELLOW   APPearance CLEAR (A) CLEAR   Specific Gravity, Urine 1.006 1.005 - 1.030   pH 6.0 5.0 - 8.0   Glucose, UA NEGATIVE NEGATIVE mg/dL   Hgb urine dipstick NEGATIVE NEGATIVE   Bilirubin Urine NEGATIVE NEGATIVE   Ketones, ur NEGATIVE NEGATIVE mg/dL   Protein, ur NEGATIVE NEGATIVE mg/dL   Nitrite NEGATIVE NEGATIVE   Leukocytes,Ua NEGATIVE NEGATIVE   RBC / HPF 0-5 0 - 5 RBC/hpf   WBC, UA 0-5 0 - 5 WBC/hpf   Bacteria, UA NONE SEEN NONE SEEN   Squamous Epithelial / LPF NONE SEEN 0 - 5   Mucus PRESENT     Comment: Performed at [facility], [address] [place], [place] [address]  Acetaminophen level     Status: Abnormal   Collection Time: 06/04/19 12:01 AM  Result Value Ref Range   Acetaminophen (Tylenol), Serum 49 (H) 10 - 30 ug/mL    Comment: (NOTE) Therapeutic concentrations vary significantly. A range of 10-30 ug/mL  may be an effective concentration for many patients. However, some  are best treated at concentrations outside of this range. Acetaminophen concentrations >150 ug/mL at 4 hours after ingestion  and >50 ug/mL at 12 hours after ingestion are often associated with  toxic reactions. Performed at [facility], [address] [place], [place] [address]     No current facility-administered medications for this encounter.    Current Outpatient Medications  Medication Sig Dispense Refill  . amLODipine (NORVASC) 5 MG tablet Take 1 tablet (5 mg total) by mouth once. 30 tablet 0  . brompheniramine-pseudoephedrine-DM 30-2-10 MG/5ML syrup Take 5 mLs by mouth 4 (four) times daily as needed. (Patient not taking: Reported on 12/02/2018) 120 mL 0  . butalbital-acetaminophen-caffeine (FIORICET,  ESGIC) 50-325-40 MG tablet Take 1-2 tablets by mouth every 6 (six) hours as needed. 20 tablet 0  . hydrochlorothiazide (HYDRODIURIL) 25 MG tablet Take 1 tablet (25 mg total) by mouth daily. 30 tablet 1    Musculoskeletal: Strength & Muscle Tone: within normal limits Gait & Station: normal Patient leans: N/A  Psychiatric Specialty Exam: Physical Exam  Nursing note and vitals reviewed. Constitutional: He is oriented to person, place, and time. He appears well-developed and well-nourished.  HENT:  Head: Normocephalic.  Eyes: Pupils are equal, round, and reactive to light. Conjunctivae are normal.  Neck: Normal range of motion.  Cardiovascular: Normal rate and regular rhythm.  Respiratory: Effort normal.  Musculoskeletal: Normal range of motion.  Neurological: He is alert and oriented to person, place, and time.  Skin: Skin is warm and dry.    Review of  Systems  Constitutional: Negative.   Eyes: Negative.   Respiratory: Negative.   Cardiovascular: Negative.   Gastrointestinal: Negative.   Genitourinary: Negative.   Musculoskeletal: Negative.   Skin: Negative.   Neurological: Negative.   Endo/Heme/Allergies: Negative.   Psychiatric/Behavioral: Positive for depression and substance abuse.  All other systems reviewed and are negative.   Blood pressure 101/72, pulse 65, temperature 98.4 F (36.9 C), temperature source Oral, resp. rate 14, height 5\' 9"  (1.753 m), weight 108.9 kg, SpO2 95 %.Body mass index is 35.44 kg/m.  General Appearance: Fairly Groomed  Eye Contact:  Absent  Speech:  Slow  Volume:  Decreased  Mood:  Depressed and Hopeless  Affect:  Depressed, Flat and Inappropriate  Thought Process:  Coherent  Orientation:  Full (Time, Place, and Person)  Thought Content:  Logical  Suicidal Thoughts:  No  Homicidal Thoughts:  No  Memory:  Immediate;   Good Recent;   Good  Judgement:  Impaired  Insight:  Lacking  Psychomotor Activity:  Normal  Concentration:   Concentration: Poor and Attention Span: Poor  Recall:  Poor  Fund of Knowledge:  Poor  Language:  Fair  Akathisia:  NA  Handed:  Right  AIMS (if indicated):     Assets:  Desire for Improvement Social Support  ADL's:  Intact  Cognition:  WNL  Sleep:   Okay     Treatment Plan Summary: Daily contact with patient to assess and evaluate symptoms and progress in treatment, Medication management and Plan The patient does meet criteria for psychiatric inpatient admission once a bed becomes available.  Disposition: Recommend psychiatric Inpatient admission when medically cleared. Supportive therapy provided about ongoing stressors.  Catalina GravelJacqueline Thomspon, NP 06/04/2019 2:02 AM

## 2019-06-04 NOTE — Progress Notes (Signed)
Admission Note:  D: Pt appeared depressed  With  a flat affect. 43 year old black male in under the services  Dr. Weber Cooks .  Patient IVC. Patient drank 1/5 of vodka and took 24 Tylenol PM .  Patient got afraid and called a friend then the Police. Patient explained that he was going to see a sick friend.  Patient stated he took the Tylenol because he had a headache. Patient had lost many friends over the years .  During interview patient stated he did it because  He was lonely and no one was checking in on him  Past Medical History: Major  depression , Cannabis Abuse  Pt  denies SI / AVH at this time.  Pt is redirectable and cooperative with assessment.      A: Pt admitted to unit per protocol, skin assessment and search done and no contraband found with [place] RN quarter size ulcer on left arm . With scab  Over it  multiple tattoos over  Both arms .  Pt  educated on therapeutic milieu rules. Pt was introduced to milieu by nursing staff.    R: Pt was receptive to education about the milieu .  15 min safety checks started. Probation officer offered support

## 2019-06-04 NOTE — ED Provider Notes (Addendum)
-----------------------------------------   12:46 AM on 06/04/2019 -----------------------------------------  Repeat Tylenol level shows declining level.  LFTs were normal.  Patient medically cleared at this time awaiting psychiatric disposition.   Harvest Dark, MD 06/04/19 0600    Harvest Dark, MD 06/04/19 4599

## 2019-06-04 NOTE — Plan of Care (Signed)
  Problem: Education: Goal: Knowledge of [facility] Education information/materials will improve Description: Instructed on [facility] , unit programing , able to verbalize understanding of information received . Note: Instructed on [employer] , unit programing , able to verbalize understanding of information received .

## 2019-06-05 DIAGNOSIS — T1491XA Suicide attempt, initial encounter: Secondary | ICD-10-CM

## 2019-06-05 DIAGNOSIS — F4010 Social phobia, unspecified: Secondary | ICD-10-CM

## 2019-06-05 DIAGNOSIS — F332 Major depressive disorder, recurrent severe without psychotic features: Principal | ICD-10-CM

## 2019-06-05 DIAGNOSIS — I10 Essential (primary) hypertension: Secondary | ICD-10-CM

## 2019-06-05 MED ORDER — FLUOXETINE HCL 20 MG PO CAPS
20.0000 mg | ORAL_CAPSULE | Freq: Every day | ORAL | Status: DC
  Administered 2019-06-05 – 2019-06-07 (×3): 20 mg via ORAL
  Filled 2019-06-05 (×3): qty 1

## 2019-06-05 MED ORDER — HYDROCHLOROTHIAZIDE 12.5 MG PO CAPS
12.5000 mg | ORAL_CAPSULE | Freq: Every day | ORAL | Status: DC
  Administered 2019-06-05 – 2019-06-06 (×2): 12.5 mg via ORAL
  Filled 2019-06-05 (×2): qty 1

## 2019-06-05 NOTE — H&P (Signed)
Psychiatric Admission Assessment Adult  Patient Identification: Roger Pennington MRN:  [id number] Date of Evaluation:  06/05/2019 Chief Complaint:  Major Depressive Disorder Principal Diagnosis: Severe recurrent major depression without psychotic features (HCC) Diagnosis:  Principal Problem:   Severe recurrent major depression without psychotic features (HCC) Active Problems:   Depression with suicidal ideation   Suicide attempt [facility])   Essential hypertension   Social anxiety disorder  History of Present Illness: Patient seen and chart reviewed.  Patient presented to the hospital after a self-admitted overdose of Tylenol PM along with alcohol.  Patient took pills and was drinking vodka.  He called his family at the same time he took the overdose.  On interview today the patient says at the time he was having suicidal thoughts but he does not think that he ever truly intended to die since he did, as he points out, call his family at the same time.  Patient says that he has been feeling lonely down and more depressed than usual since he and his girlfriend broke up several months ago.  His mood feels empty much of the time.  Feels lonely and feels like he has no one in the world to turn to although as he admits he actually has several family members and people in his life who pay attention to him.  He admits that he was drinking at the time of admission but denies regular alcohol use.  Admits to regular cannabis use.  Not currently receiving any psychiatric medicine but he is a client through [facility] and apparently gets a call from someone perhaps peers support twice a week.  Has engaged in therapy in the past.  Patient admits that he has been sleeping poorly of late.  Frequently stays up very late at night and does not get much sleep before going into work. Associated Signs/Symptoms: Depression Symptoms:  depressed mood, anhedonia, insomnia, fatigue, difficulty concentrating, hopelessness, suicidal  attempt, (Hypo) Manic Symptoms:  None reported Anxiety Symptoms:  Excessive Worry, Social Anxiety, Psychotic Symptoms:  None PTSD Symptoms: Negative Total Time spent with patient: 1 hour  Past Psychiatric History: Patient has had previous hospitalizations for instance in 2018 under extremely similar circumstances.  He is insightful and seem that part of his problem is that he cannot tolerate any kind of rejection of his feelings.  Gets overwhelmed and hopeless and impulsive when he feels that people are not paying him attention.  Has consistently refused medication in the past out of some misguided believes about the side effects.  No previous history of psychosis.  As far as I can tell no history of alcohol abuse  Is the patient at risk to self? Yes.    Has the patient been a risk to self in the past 6 months? No.  Has the patient been a risk to self within the distant past? Yes.    Is the patient a risk to others? No.  Has the patient been a risk to others in the past 6 months? No.  Has the patient been a risk to others within the distant past? No.   Prior Inpatient Therapy:   Prior Outpatient Therapy:    Alcohol Screening: 1. How often do you have a drink containing alcohol?: Monthly or less 2. How many drinks containing alcohol do you have on a typical day when you are drinking?: 1 or 2 3. How often do you have six or more drinks on one occasion?: Never AUDIT-C Score: 1 4. How often during the  last year have you found that you were not able to stop drinking once you had started?: Never 5. How often during the last year have you failed to do what was normally expected from you becasue of drinking?: Never 6. How often during the last year have you needed a first drink in the morning to get yourself going after a heavy drinking session?: Never 7. How often during the last year have you had a feeling of guilt of remorse after drinking?: Never 8. How often during the last year have you  been unable to remember what happened the night before because you had been drinking?: Never 9. Have you or someone else been injured as a result of your drinking?: No 10. Has a relative or friend or a doctor or another health worker been concerned about your drinking or suggested you cut down?: No Alcohol Use Disorder Identification Test Final Score (AUDIT): 1 Alcohol Brief Interventions/Follow-up: AUDIT Score <7 follow-up not indicated Substance Abuse History in the last 12 months:  Yes.   Consequences of Substance Abuse: Medical Consequences:  Possible contribution to current escapades.  Additionally the chronic marijuana use is probably not helping with his loneliness. Previous Psychotropic Medications: No  Psychological Evaluations: Yes  Past Medical History:  Past Medical History:  Diagnosis Date  . Tobacco abuse     Past Surgical History:  Procedure Laterality Date  . ESOPHAGOGASTRODUODENOSCOPY (EGD) WITH PROPOFOL N/A 02/09/2016   Procedure: ESOPHAGOGASTRODUODENOSCOPY (EGD) WITH PROPOFOL;  Surgeon: Lollie Sails, MD;  Location: [facility] ENDOSCOPY;  Service: Endoscopy;  Laterality: N/A;   Family History:  Family History  Problem Relation Age of Onset  . Hypertension Father   . Hypertension Brother    Family Psychiatric  History: None known Tobacco Screening: Have you used any form of tobacco in the last 30 days? (Cigarettes, Smokeless Tobacco, Cigars, and/or Pipes): Yes Tobacco use, Select all that apply: 5 or more cigarettes per day Are you interested in Tobacco Cessation Medications?: Yes, will notify MD for an order Counseled patient on smoking cessation including recognizing danger situations, developing coping skills and basic information about quitting provided: Refused/Declined practical counseling Social History:  Social History   Substance and Sexual Activity  Alcohol Use Yes  . Alcohol/week: 0.0 standard drinks   Comment: occ     Social History   Substance and  Sexual Activity  Drug Use No    Additional Social History:                           Allergies:  No Known Allergies Lab Results:  Results for orders placed or performed during the hospital encounter of 06/03/19 (from the past 48 hour(s))  Ethanol     Status: Abnormal   Collection Time: 06/03/19  8:20 PM  Result Value Ref Range   Alcohol, Ethyl (B) 144 (H) <10 mg/dL    Comment: (NOTE) Lowest detectable limit for serum alcohol is 10 mg/dL. For medical purposes only. Performed at [facility], [address] [place], [place] [address]   Acetaminophen level     Status: Abnormal   Collection Time: 06/03/19  8:20 PM  Result Value Ref Range   Acetaminophen (Tylenol), Serum 64 (H) 10 - 30 ug/mL    Comment: (NOTE) Therapeutic concentrations vary significantly. A range of 10-30 ug/mL  may be an effective concentration for many patients. However, some  are best treated at concentrations outside of this range. Acetaminophen concentrations >150 ug/mL  at 4 hours after ingestion  and >50 ug/mL at 12 hours after ingestion are often associated with  toxic reactions. Performed at [facility], [address] [place], [place] [address]   Salicylate level     Status: None   Collection Time: 06/03/19  8:20 PM  Result Value Ref Range   Salicylate Lvl <7.0 2.8 - 30.0 mg/dL    Comment: Performed at [facility], [address] [place], [place] [address]  Comprehensive metabolic panel     Status: Abnormal   Collection Time: 06/03/19  8:29 PM  Result Value Ref Range   Sodium 139 135 - 145 mmol/L   Potassium 3.8 3.5 - 5.1 mmol/L    Comment: HEMOLYSIS AT THIS LEVEL MAY AFFECT RESULT   Chloride 107 98 - 111 mmol/L   CO2 22 22 - 32 mmol/L   Glucose, Bld 176 (H) 70 - 99 mg/dL   BUN 11 6 - 20 mg/dL   Creatinine, Ser 5.17 0.61 - 1.24 mg/dL   Calcium 8.7 (L) 8.9 - 10.3 mg/dL   Total Protein 7.1 6.5 - 8.1 g/dL   Albumin 3.9 3.5 - 5.0 g/dL   AST 19 15 - 41  U/L   ALT 15 0 - 44 U/L   Alkaline Phosphatase 64 38 - 126 U/L   Total Bilirubin 0.6 0.3 - 1.2 mg/dL   GFR calc non Af Amer >60 >60 mL/min   GFR calc Af Amer >60 >60 mL/min   Anion gap 10 5 - 15    Comment: Performed at [facility], [address] [place], [place] [address]  CBC with Differential     Status: Abnormal   Collection Time: 06/03/19  8:29 PM  Result Value Ref Range   WBC 6.0 4.0 - 10.5 K/uL   RBC 5.06 4.22 - 5.81 MIL/uL   Hemoglobin 12.8 (L) 13.0 - 17.0 g/dL   HCT 37.1 06.2 - 69.4 %   MCV 81.8 80.0 - 100.0 fL   MCH 25.3 (L) 26.0 - 34.0 pg   MCHC 30.9 30.0 - 36.0 g/dL   RDW 85.4 62.7 - 03.5 %   Platelets 226 150 - 400 K/uL   nRBC 0.0 0.0 - 0.2 %   Neutrophils Relative % 41 %   Neutro Abs 2.5 1.7 - 7.7 K/uL   Lymphocytes Relative 49 %   Lymphs Abs 3.0 0.7 - 4.0 K/uL   Monocytes Relative 5 %   Monocytes Absolute 0.3 0.1 - 1.0 K/uL   Eosinophils Relative 4 %   Eosinophils Absolute 0.2 0.0 - 0.5 K/uL   Basophils Relative 1 %   Basophils Absolute 0.0 0.0 - 0.1 K/uL   Immature Granulocytes 0 %   Abs Immature Granulocytes 0.01 0.00 - 0.07 K/uL    Comment: Performed at [facility], [address] [place], [place] [address]  Urine Drug Screen, Qualitative     Status: Abnormal   Collection Time: 06/03/19  8:29 PM  Result Value Ref Range   Tricyclic, Ur Screen NONE DETECTED NONE DETECTED   Amphetamines, Ur Screen NONE DETECTED NONE DETECTED   MDMA (Ecstasy)Ur Screen NONE DETECTED NONE DETECTED   Cocaine Metabolite,Ur [place] NONE DETECTED NONE DETECTED   Opiate, Ur Screen NONE DETECTED NONE DETECTED   Phencyclidine (PCP) Ur S NONE DETECTED NONE DETECTED   Cannabinoid 50 Ng, Ur [place] POSITIVE (A) NONE DETECTED   Barbiturates, Ur Screen NONE DETECTED NONE DETECTED   Benzodiazepine, Ur Scrn NONE DETECTED NONE DETECTED  Methadone Scn, Ur NONE DETECTED NONE DETECTED    Comment: (NOTE) Tricyclics + metabolites, urine    Cutoff 1000 ng/mL Amphetamines +  metabolites, urine  Cutoff 1000 ng/mL MDMA (Ecstasy), urine              Cutoff 500 ng/mL Cocaine Metabolite, urine          Cutoff 300 ng/mL Opiate + metabolites, urine        Cutoff 300 ng/mL Phencyclidine (PCP), urine         Cutoff 25 ng/mL Cannabinoid, urine                 Cutoff 50 ng/mL Barbiturates + metabolites, urine  Cutoff 200 ng/mL Benzodiazepine, urine              Cutoff 200 ng/mL Methadone, urine                   Cutoff 300 ng/mL The urine drug screen provides only a preliminary, unconfirmed analytical test result and should not be used for non-medical purposes. Clinical consideration and professional judgment should be applied to any positive drug screen result due to possible interfering substances. A more specific alternate chemical method must be used in order to obtain a confirmed analytical result. Gas chromatography / mass spectrometry (GC/MS) is the preferred confirmat ory method. Performed at [facility], [address] [place], [place] [address]   Urinalysis, Complete w Microscopic     Status: Abnormal   Collection Time: 06/03/19  8:29 PM  Result Value Ref Range   Color, Urine STRAW (A) YELLOW   APPearance CLEAR (A) CLEAR   Specific Gravity, Urine 1.006 1.005 - 1.030   pH 6.0 5.0 - 8.0   Glucose, UA NEGATIVE NEGATIVE mg/dL   Hgb urine dipstick NEGATIVE NEGATIVE   Bilirubin Urine NEGATIVE NEGATIVE   Ketones, ur NEGATIVE NEGATIVE mg/dL   Protein, ur NEGATIVE NEGATIVE mg/dL   Nitrite NEGATIVE NEGATIVE   Leukocytes,Ua NEGATIVE NEGATIVE   RBC / HPF 0-5 0 - 5 RBC/hpf   WBC, UA 0-5 0 - 5 WBC/hpf   Bacteria, UA NONE SEEN NONE SEEN   Squamous Epithelial / LPF NONE SEEN 0 - 5   Mucus PRESENT     Comment: Performed at [facility], [address] [place], [place] [address]  Acetaminophen level     Status: Abnormal   Collection Time: 06/04/19 12:01 AM  Result Value Ref Range   Acetaminophen (Tylenol), Serum 49 (H) 10 - 30 ug/mL     Comment: (NOTE) Therapeutic concentrations vary significantly. A range of 10-30 ug/mL  may be an effective concentration for many patients. However, some  are best treated at concentrations outside of this range. Acetaminophen concentrations >150 ug/mL at 4 hours after ingestion  and >50 ug/mL at 12 hours after ingestion are often associated with  toxic reactions. Performed at [facility], [address] [place], [place] [address]   SARS Coronavirus 2 (CEPHEID - Performed in [facility] hospital lab), Hosp Order     Status: None   Collection Time: 06/04/19 11:17 AM   Specimen: Nasopharyngeal Swab  Result Value Ref Range   SARS Coronavirus 2 NEGATIVE NEGATIVE    Comment: (NOTE) If result is NEGATIVE SARS-CoV-2 target nucleic acids are NOT DETECTED. The SARS-CoV-2 RNA is generally detectable in upper and lower  respiratory specimens during the acute phase of infection. The lowest  concentration of SARS-CoV-2 viral copies this assay can detect is 250  copies / mL. A negative result does not preclude SARS-CoV-2 infection  and should not be used as the sole basis for treatment or other  patient management decisions.  A negative result may occur with  improper specimen collection / handling, submission of specimen other  than nasopharyngeal swab, presence of viral mutation(s) within the  areas targeted by this assay, and inadequate number of viral copies  (<250 copies / mL). A negative result must be combined with clinical  observations, patient history, and epidemiological information. If result is POSITIVE SARS-CoV-2 target nucleic acids are DETECTED. The SARS-CoV-2 RNA is generally detectable in upper and lower  respiratory specimens dur ing the acute phase of infection.  Positive  results are indicative of active infection with SARS-CoV-2.  Clinical  correlation with patient history and other diagnostic information is  necessary to determine patient infection status.   Positive results do  not rule out bacterial infection or co-infection with other viruses. If result is PRESUMPTIVE POSTIVE SARS-CoV-2 nucleic acids MAY BE PRESENT.   A presumptive positive result was obtained on the submitted specimen  and confirmed on repeat testing.  While 2019 novel coronavirus  (SARS-CoV-2) nucleic acids may be present in the submitted sample  additional confirmatory testing may be necessary for epidemiological  and / or clinical management purposes  to differentiate between  SARS-CoV-2 and other Sarbecovirus currently known to infect humans.  If clinically indicated additional testing with an alternate test  methodology [phone]([id number]) is advised. The SARS-CoV-2 RNA is generally  detectable in upper and lower respiratory sp ecimens during the acute  phase of infection. The expected result is Negative. Fact Sheet for Patients:  BoilerBrush.com.cyhttps://www.fda.gov/media/136312/download Fact Sheet for Healthcare Providers: https://pope.com/https://www.fda.gov/media/136313/download This test is not yet approved or cleared by the [place] FDA and has been authorized for detection and/or diagnosis of SARS-CoV-2 by FDA under an Emergency Use Authorization (EUA).  This EUA will remain in effect (meaning this test can be used) for the duration of the COVID-19 declaration under Section 564(b)(1) of the Act, 21 U.S.C. section 360bbb-3(b)(1), unless the authorization is terminated or revoked sooner. Performed at [facility], [address] [place], [place] [address]     Blood Alcohol level:  Lab Results  Component Value Date   ETH 144 (H) 06/03/2019   ETH <5 06/30/2017    Metabolic Disorder Labs:  No results found for: HGBA1C, MPG No results found for: PROLACTIN Lab Results  Component Value Date   CHOL 176 07/02/2017   TRIG 198 (H) 07/02/2017   HDL 31 (L) 07/02/2017   CHOLHDL 5.7 07/02/2017   VLDL 40 07/02/2017   LDLCALC 105 (H) 07/02/2017    Current Medications: Current  Facility-Administered Medications  Medication Dose Route Frequency Provider Last Rate Last Dose  . alum & mag hydroxide-simeth (MAALOX/MYLANTA) 200-200-20 MG/5ML suspension 30 mL  30 mL Oral Q4H PRN Catalina Gravelhomspon, Jacqueline, NP      . FLUoxetine (PROZAC) capsule 20 mg  20 mg Oral Daily Merita Hawks T, MD   20 mg at 06/05/19 1628  . hydrochlorothiazide (MICROZIDE) capsule 12.5 mg  12.5 mg Oral Daily Katie Moch T, MD      . magnesium hydroxide (MILK OF MAGNESIA) suspension 30 mL  30 mL Oral Daily PRN Catalina Gravelhomspon, Jacqueline, NP       PTA Medications: Medications Prior to Admission  Medication Sig Dispense Refill Last Dose  . amLODipine (NORVASC) 5 MG tablet Take 1 tablet (5 mg total) by mouth once. 30 tablet 0   .  brompheniramine-pseudoephedrine-DM 30-2-10 MG/5ML syrup Take 5 mLs by mouth 4 (four) times daily as needed. (Patient not taking: Reported on 12/02/2018) 120 mL 0   . butalbital-acetaminophen-caffeine (FIORICET, ESGIC) 50-325-40 MG tablet Take 1-2 tablets by mouth every 6 (six) hours as needed. 20 tablet 0   . hydrochlorothiazide (HYDRODIURIL) 25 MG tablet Take 1 tablet (25 mg total) by mouth daily. 30 tablet 1     Musculoskeletal: Strength & Muscle Tone: within normal limits Gait & Station: normal Patient leans: N/A  Psychiatric Specialty Exam: Physical Exam  Nursing note and vitals reviewed. Constitutional: He appears well-developed and well-nourished.  HENT:  Head: Normocephalic and atraumatic.  Eyes: Pupils are equal, round, and reactive to light. Conjunctivae are normal.  Neck: Normal range of motion.  Cardiovascular: Regular rhythm and normal heart sounds.  Respiratory: Effort normal.  GI: Soft.  Musculoskeletal: Normal range of motion.  Neurological: He is alert.  Skin: Skin is warm and dry.  Psychiatric: Judgment normal. His speech is delayed. He is slowed. Cognition and memory are normal. He exhibits a depressed mood. He expresses no suicidal ideation.    Review of  Systems  Constitutional: Negative.   HENT: Negative.   Eyes: Negative.   Respiratory: Negative.   Cardiovascular: Negative.   Gastrointestinal: Negative.   Musculoskeletal: Negative.   Skin: Negative.   Neurological: Negative.   Psychiatric/Behavioral: Positive for depression. Negative for suicidal ideas. The patient is nervous/anxious.     Blood pressure (!) 130/96, pulse 67, temperature 97.8 F (36.6 C), temperature source Oral, resp. rate 18, height 6' (1.829 m), weight 113.9 kg, SpO2 99 %.Body mass index is 34.04 kg/m.  General Appearance: Casual  Eye Contact:  Good  Speech:  Clear and Coherent  Volume:  Normal  Mood:  Dysphoric  Affect:  Congruent  Thought Process:  Coherent  Orientation:  Full (Time, Place, and Person)  Thought Content:  Logical  Suicidal Thoughts:  No  Homicidal Thoughts:  No  Memory:  Immediate;   Fair Recent;   Fair Remote;   Fair  Judgement:  Fair  Insight:  Fair  Psychomotor Activity:  Normal  Concentration:  Concentration: Fair  Recall:  [employer] of Knowledge:  Fair  Language:  Fair  Akathisia:  No  Handed:  Right  AIMS (if indicated):     Assets:  Desire for Improvement Housing Physical Health  ADL's:  Intact  Cognition:  WNL  Sleep:  Number of Hours: 7.5    Treatment Plan Summary: Plan Patient agreed to try starting antidepressant medicine.  Start Prozac.  Explained side effects and the effects to be hoped for.  Emphasized that he will not instantly feel better.  Patient was agreeable.  Full evaluation by treatment team.  Evaluation by representative from [facility].  Probably relatively short length of stay.  Patient appears to have a problem with chronic mood fragility and may be back to as much of a baseline as we will achieve in the hospital in the short-term.  Observation Level/Precautions:  15 minute checks  Laboratory:  Chemistry Profile  Psychotherapy:    Medications:    Consultations:    Discharge Concerns:    Estimated LOS:   Other:     Physician Treatment Plan for Primary Diagnosis: Severe recurrent major depression without psychotic features (HCC) Long Term Goal(s): Improvement in symptoms so as ready for discharge  Short Term Goals: Ability to verbalize feelings will improve, Ability to disclose and discuss suicidal ideas and Ability to demonstrate self-control will  improve  Physician Treatment Plan for Secondary Diagnosis: Principal Problem:   Severe recurrent major depression without psychotic features (HCC) Active Problems:   Depression with suicidal ideation   Suicide attempt [facility](HCC)   Essential hypertension   Social anxiety disorder  Long Term Goal(s): Improvement in symptoms so as ready for discharge  Short Term Goals: Compliance with prescribed medications will improve  I certify that inpatient services furnished can reasonably be expected to improve the patient's condition.    Mordecai RasmussenJohn Brysen Shankman, MD 6/18/20205:48 PM

## 2019-06-05 NOTE — Plan of Care (Signed)
Patient observed pacing in hallway earlier in evening. Was in group break room with other patient with not  much interaction. Watching television. Denies SI/HI at present. Continued encouragement will prevail.

## 2019-06-05 NOTE — Progress Notes (Signed)
D- Patient alert and oriented. Observed with sad facies.pacing hallway. Denies severe pain.. Goal is to seek other coping mechanisms. . A- Scheduled medications administered to patient, per MD orders. Support and encouragement provided.  Routine safety checks conducted every 15 minutes.  Patient informed to notify staff with problems or concerns. R- Patient contracts for safety at this time. Patient compliant with treatment plan. Patient receptive and cooperative.  Patient remains safe at this time.

## 2019-06-05 NOTE — BHH Counselor (Signed)
Adult Comprehensive Assessment  Patient ID: Roger Pennington, male   DOB: 29-Oct-1976, 43 y.o.   MRN: [id number]  Information Source:   Information source: Patient   Current Stressors:  Reason for hospitalization: Pt states "I took pills because I was lonely and felt no one cared." Goal for this hospitalization: "To get myself back together" Bereavement / Loss: None reported   Living/Environment/Situation:  Living Arrangements: Lives alone How long has patient lived in current situation?: 2 yrs What is atmosphere in current home: Comfortable   Family History:  Marital status: Single Are you sexually active?: No What is your sexual orientation?: heterosexual Has your sexual activity been affected by drugs, alcohol, medication, or emotional stress?: no Does patient have children?: Yes  How many children?: 3 How is patient's relationship with their children?: Pt says his children do not live with him, live with their mother. Pt reports children's ages 62, 62, 18   Childhood History:  By whom was/is the patient raised?: Both parents Description of patient's relationship with caregiver when they were a child/adult: " real good" Does patient have siblings?: Yes Number of Siblings: 3 bros, 1 sis Description of patient's current relationship with siblings: "good" Did patient suffer any verbal/emotional/physical/sexual abuse as a child?: No Did patient suffer from severe childhood neglect?: No Has patient ever been sexually abused/assaulted/raped as an adolescent or adult?: No Was the patient ever a victim of a crime or a disaster?: No Witnessed domestic violence?: No Has patient been effected by domestic violence as an adult?: No   Education: Highest grade completed: 11th, went to [employer] and received GED  Currently a Consulting civil engineer?: No Learning disability?: No   Employment/Work Situation:   Employment situation: Employed 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 [place] How long has patient been employed?: 6 mths Patient's job has been impacted by current illness: No Has patient ever been in the [employer]?: No Has patient ever served in combat?: No Did You Receive Any Psychiatric Treatment/Services While in Equities trader?: No Are There Guns or Other Weapons in Your Home?: No Are These Weapons Safely Secured?: Pt denies Archivist Resources:   Financial resources: Income from employment Does patient have a representative payee or guardian?: No   Alcohol/Substance Abuse:   What has been your use of drugs/alcohol within the last 12 months?: Pt states he drank half bottle of vodka during his suicide attempt, but denies any drug or alcohol history. If attempted suicide, did drugs/alcohol play a role in this?: Yes Alcohol/Substance Abuse Treatment Hx: Denies past history Has alcohol/substance abuse ever caused legal problems?: No   Social Support System:   Patient's Community Support System: Poor Describe Community Support System: Pt states when his friend moved back to [place] his life went "downhill" Pt says he does not have any supports. Type of faith/religion: none How does patient's faith help to cope with current illness?: None reported   Leisure/Recreation:   Leisure and Hobbies: Control and instrumentation engineer, cards and interacting with his children   Strengths/Needs:   What things does the patient do well?: "Detail work, being outside" In what areas does patient struggle / problems for patient: "My finances-budgeting"   Discharge Plan:   Does patient have access to transportation?: Yes Will patient be returning to same living situation after discharge?: Yes Currently receiving community mental health services: Yes If no, would patient like referral for services when discharged?: Pt would like to resume outpatient treatment at [facility] where he has  been seen for 3 yrs. Does patient have financial barriers related to discharge medications? Yes,  no insurance   Summary/Recommendations:   Summary and Recommendations (to be completed by the evaluator): Pt is a 43 yr old male with a diagnosis of Intentional acetaminophen overdose. Pt reports he had been experiencing loneliness and feeling like no one cares about him which led to his attempt at harming himself. Pt denies any hx of drug or alcohol use. Pt last seen at [facility] in July 2018. Pt says he has been receiving outpatient treatment at [facility] in [place] for 3 years and would like to resume treatment there. While here, patient will benefit from crisis stabilization, medication evaluation, group therapy and psychoeducation. In addition, it is recommended that patient remain compliant with the established discharge plan and continue treatment.      Roger Pennington Roger Pennington. 06/05/2019

## 2019-06-05 NOTE — Plan of Care (Signed)
Patient  instructed on  [facility] Education unit programing , able to verbalize understanding . Working on coping and Clinical biochemist . Verbalize understanding of medication received   Denies suicidal ideation .   Problem: Education: Goal: Knowledge of [facility] Education information/materials will improve Description: Instructed on [place] Education , unit programing , able to verbalize understanding of information received . Outcome: Progressing   Problem: Coping: Goal: Coping ability will improve Outcome: Progressing   Problem: Medication: Goal: Compliance with prescribed medication regimen will improve Outcome: Progressing   Problem: Safety: Goal: Ability to disclose and discuss suicidal ideas will improve Outcome: Progressing

## 2019-06-05 NOTE — BHH Suicide Risk Assessment (Signed)
[facility] INPATIENT:  Family/Significant Other Suicide Prevention Education  Suicide Prevention Education:  Patient Refusal for Family/Significant Other Suicide Prevention Education: The patient Roger Pennington has refused to provide written consent for family/significant other to be provided Family/Significant Other Suicide Prevention Education during admission and/or prior to discharge.  Physician notified.  Niemah Schwebke T Tiffine Henigan 06/05/2019, 10:23 AM

## 2019-06-05 NOTE — Progress Notes (Signed)
D: Patient stated slept good last night .Stated appetite  Fair  and energy level  normal. Stated concentration fair  . Stated on Depression scale 0, hopeless 0 and anxiety 0 .( low 0-10 high) Denies suicidal  homicidal ideations  .  No auditory hallucinations  No pain concerns . Appropriate ADL'S. Interacting with peers and staff. Patient  instructed on  [facility] Education unit programing , able to verbalize understanding . Working on coping and Clinical biochemist . Verbalize understanding of medication received   Denies suicidal ideation . Patient talked about be alone  Recently broke up with girlfriend . Stated he had always had some one  With him . Voice  Of the possibility of being Bipolar.   A: Encourage patient participation with unit programming . Instruction  Given on  Medication , verbalize understanding.  R: Voice no other concerns. Staff continue to monitor

## 2019-06-05 NOTE — BHH Group Notes (Signed)
LCSW Group Therapy Note  06/05/2019 12:40 PM  Type of Therapy/Topic:  Group Therapy:  Balance in Life  Participation Level:  Minimal  Description of Group:    This group will address the concept of balance and how it feels and looks when one is unbalanced. Patients will be encouraged to process areas in their lives that are out of balance and identify reasons for remaining unbalanced. Facilitators will guide patients in utilizing problem-solving interventions to address and correct the stressor making their life unbalanced. Understanding and applying boundaries will be explored and addressed for obtaining and maintaining a balanced life. Patients will be encouraged to explore ways to assertively make their unbalanced needs known to significant others in their lives, using other group members and facilitator for support and feedback.  Therapeutic Goals: 1. Patient will identify two or more emotions or situations they have that consume much of in their lives. 2. Patient will identify signs/triggers that life has become out of balance:  3. Patient will identify two ways to set boundaries in order to achieve balance in their lives:  4. Patient will demonstrate ability to communicate their needs through discussion and/or role plays  Summary of Patient Progress: Pt was present in group, but did not contribute much to the group discussion. Pt did discuss preferring to sleep in total darkness, but that he needs background noise to be comfortable.      Therapeutic Modalities:   Cognitive Behavioral Therapy Solution-Focused Therapy Assertiveness Training  Evalina Field, MSW, LCSW Clinical Social Work 06/05/2019 12:40 PM

## 2019-06-05 NOTE — Progress Notes (Signed)
Recreation Therapy Notes  Date: 06/05/2019  Time: 9:30 am   Location: Craft room   Behavioral response: N/A   Intervention Topic: Communication  Discussion/Intervention: Patient did not attend group.   Clinical Observations/Feedback:  Patient did not attend group.   Charnae Lill LRT/CTRS        Roger Pennington 06/05/2019 11:07 AM 

## 2019-06-05 NOTE — BHH Suicide Risk Assessment (Signed)
[facility] Admission Suicide Risk Assessment   Nursing information obtained from:  Patient Demographic factors:  Male, Living alone Current Mental Status:  Self-harm thoughts Loss Factors:  Loss of significant relationship, Financial problems / change in socioeconomic status Historical Factors:  Prior suicide attempts, Impulsivity Risk Reduction Factors:  Employed, Positive social support  Total Time spent with patient: 1 hour Principal Problem: Severe recurrent major depression without psychotic features ([place]) Diagnosis:  Principal Problem:   Severe recurrent major depression without psychotic features ([place]) Active Problems:   Depression with suicidal ideation   Suicide attempt [facility])   Essential hypertension   Social anxiety disorder  Subjective Data: Patient admitted after a self confessed overdose including Tylenol and with suicidal ideation.  On interview today he shows insight and is able to is to discuss his impulsive depressive nature.  He denies currently having any wish to die.  He does not feel that his overdose truly represented a wish to die because as he points out he called his family at the same time that he was doing it.  Denies any psychotic symptoms.  Shows some insight and willingness to engage in treatment.  Denies that he has been abusing substances regularly although he was intoxicated when he took the overdose.  Continued Clinical Symptoms:  Alcohol Use Disorder Identification Test Final Score (AUDIT): 1 The "Alcohol Use Disorders Identification Test", Guidelines for Use in Primary Care, Second Edition.  World Pharmacologist [facility]). Score between 0-7:  no or low risk or alcohol related problems. Score between 8-15:  moderate risk of alcohol related problems. Score between 16-19:  high risk of alcohol related problems. Score 20 or above:  warrants further diagnostic evaluation for alcohol dependence and treatment.   CLINICAL FACTORS:   Depression:    Impulsivity   Musculoskeletal: Strength & Muscle Tone: within normal limits Gait & Station: normal Patient leans: N/A  Psychiatric Specialty Exam: Physical Exam  Nursing note and vitals reviewed. Constitutional: He appears well-developed and well-nourished.  HENT:  Head: Normocephalic and atraumatic.  Eyes: Pupils are equal, round, and reactive to light. Conjunctivae are normal.  Neck: Normal range of motion.  Cardiovascular: Regular rhythm and normal heart sounds.  Respiratory: Effort normal. No respiratory distress.  GI: Soft.  Musculoskeletal: Normal range of motion.  Neurological: He is alert.  Skin: Skin is warm and dry.  Psychiatric: Judgment normal. His speech is delayed. He is slowed. Thought content is not paranoid. He exhibits a depressed mood. He expresses no homicidal and no suicidal ideation. He exhibits abnormal recent memory.    Review of Systems  Constitutional: Negative.   HENT: Negative.   Eyes: Negative.   Respiratory: Negative.   Cardiovascular: Negative.   Gastrointestinal: Negative.   Musculoskeletal: Negative.   Skin: Negative.   Neurological: Negative.   Psychiatric/Behavioral: Positive for depression. The patient is nervous/anxious.     Blood pressure (!) 130/96, pulse 67, temperature 97.8 F (36.6 C), temperature source Oral, resp. rate 18, height 6' (1.829 m), weight 113.9 kg, SpO2 99 %.Body mass index is 34.04 kg/m.  General Appearance: Casual  Eye Contact:  Good  Speech:  Clear and Coherent  Volume:  Normal  Mood:  Dysphoric  Affect:  Congruent  Thought Process:  Goal Directed  Orientation:  Full (Time, Place, and Person)  Thought Content:  Logical  Suicidal Thoughts:  No  Homicidal Thoughts:  No  Memory:  Immediate;   Fair Recent;   Fair Remote;   Fair  Judgement:  Fair  Insight:  Fair  Psychomotor Activity:  Normal  Concentration:  Concentration: Fair  Recall:  [employer] of Knowledge:  Fair  Language:  Fair  Akathisia:  No   Handed:  Right  AIMS (if indicated):     Assets:  Desire for Improvement Housing Physical Health  ADL's:  Intact  Cognition:  WNL  Sleep:  Number of Hours: 7.5      COGNITIVE FEATURES THAT CONTRIBUTE TO RISK:  Polarized thinking    SUICIDE RISK:   Mild:  Suicidal ideation of limited frequency, intensity, duration, and specificity.  There are no identifiable plans, no associated intent, mild dysphoria and related symptoms, good self-control (both objective and subjective assessment), few other risk factors, and identifiable protective factors, including available and accessible social support.  PLAN OF CARE: Patient agreed to start antidepressant medicine.  15-minute checks continued.  Evaluate by treatment team.  Involved in individual and group therapy.  Work on appropriate discharge planning with follow-up treatment.  I certify that inpatient services furnished can reasonably be expected to improve the patient's condition.   Mordecai Rasmussen, MD 06/05/2019, 5:45 PM

## 2019-06-06 MED ORDER — AMLODIPINE BESYLATE 5 MG PO TABS
5.0000 mg | ORAL_TABLET | Freq: Every day | ORAL | Status: DC
  Administered 2019-06-06 – 2019-06-07 (×2): 5 mg via ORAL
  Filled 2019-06-06 (×2): qty 1

## 2019-06-06 MED ORDER — FLUOXETINE HCL 20 MG PO CAPS: 20.0000 mg | ORAL_CAPSULE | Freq: Every day | ORAL | 0 refills | Status: DC

## 2019-06-06 MED ORDER — HYDROCHLOROTHIAZIDE 25 MG PO TABS
25.0000 mg | ORAL_TABLET | Freq: Every day | ORAL | Status: DC
  Administered 2019-06-07: 25 mg via ORAL
  Filled 2019-06-06: qty 1

## 2019-06-06 MED ORDER — AMLODIPINE BESYLATE 5 MG PO TABS: 5.0000 mg | ORAL_TABLET | Freq: Every day | ORAL | 0 refills | Status: DC

## 2019-06-06 MED ORDER — HYDROCHLOROTHIAZIDE 25 MG PO TABS: 25.0000 mg | ORAL_TABLET | Freq: Every day | ORAL | 0 refills | Status: DC

## 2019-06-06 NOTE — Progress Notes (Signed)
D - Patient was in his room upon arrival to the unit. Patient was pleasant during assessment and medication administration. Patient denies SI/HI/AVH, pain, anxiety and depression with this Probation officer.   A - Patient didn't have any medication scheduled this evening. Patient compliant with procedures on the unit. Patient given education. Patient given support and encouragement to be active in his treatment plan. Patient informed to let staff know if there are any issues or problems on the unit.   R - Patient being monitored Q 15 minutes for safety per unit protocol. Patient remains safe on the unit.

## 2019-06-06 NOTE — Progress Notes (Signed)
[facility] MD Progress Note  06/06/2019 3:20 PM Roger Pennington  MRN:  [id number] Subjective: Follow-up for this patient with depression.  Patient met with treatment team and met individually.  Denies any current suicidal ideation.  States that mood is feeling okay.  Continues to have good insight into his condition.  Met with provider coordinator from [place].  Patient has not had any behavior problems or indicated any suicidality on the unit.  No physical complaints.  Blood pressure remains high. Principal Problem: Severe recurrent major depression without psychotic features ([place]) Diagnosis: Principal Problem:   Severe recurrent major depression without psychotic features ([place]) Active Problems:   Depression with suicidal ideation   Suicide attempt [facility])   Essential hypertension   Social anxiety disorder  Total Time spent with patient: 30 minutes  Past Psychiatric History: History of prior hospitalization under similar circumstances from chronic mood instability  Past Medical History:  Past Medical History:  Diagnosis Date  . Tobacco abuse     Past Surgical History:  Procedure Laterality Date  . ESOPHAGOGASTRODUODENOSCOPY (EGD) WITH PROPOFOL N/A 02/09/2016   Procedure: ESOPHAGOGASTRODUODENOSCOPY (EGD) WITH PROPOFOL;  Surgeon: Lollie Sails, MD;  Location: [facility] ENDOSCOPY;  Service: Endoscopy;  Laterality: N/A;   Family History:  Family History  Problem Relation Age of Onset  . Hypertension Father   . Hypertension Brother    Family Psychiatric  History: See previous Social History:  Social History   Substance and Sexual Activity  Alcohol Use Yes  . Alcohol/week: 0.0 standard drinks   Comment: occ     Social History   Substance and Sexual Activity  Drug Use No    Social History   Socioeconomic History  . Marital status: Single    Spouse name: Not on file  . Number of children: Not on file  . Years of education: Not on file  . Highest education level: Not on file   Occupational History  . Not on file  Social Needs  . Financial resource strain: Not on file  . Food insecurity    Worry: Not on file    Inability: Not on file  . Transportation needs    Medical: Not on file    Non-medical: Not on file  Tobacco Use  . Smoking status: Current Every Day Smoker  . Smokeless tobacco: Never Used  Substance and Sexual Activity  . Alcohol use: Yes    Alcohol/week: 0.0 standard drinks    Comment: occ  . Drug use: No  . Sexual activity: Not on file  Lifestyle  . Physical activity    Days per week: Not on file    Minutes per session: Not on file  . Stress: Not on file  Relationships  . Social Herbalist on phone: Not on file    Gets together: Not on file    Attends religious service: Not on file    Active member of club or organization: Not on file    Attends meetings of clubs or organizations: Not on file    Relationship status: Not on file  Other Topics Concern  . Not on file  Social History Narrative  . Not on file   Additional Social History:                         Sleep: Fair  Appetite:  Fair  Current Medications: Current Facility-Administered Medications  Medication Dose Route Frequency Provider Last Rate Last Dose  . alum &  mag hydroxide-simeth (MAALOX/MYLANTA) 200-200-20 MG/5ML suspension 30 mL  30 mL Oral Q4H PRN Thomspon, Geni Bers, NP      . amLODipine (NORVASC) tablet 5 mg  5 mg Oral Daily Clapacs, John T, MD      . FLUoxetine (PROZAC) capsule 20 mg  20 mg Oral Daily Clapacs, Madie Reno, MD   20 mg at 06/06/19 0852  . [START ON 06/07/2019] hydrochlorothiazide (HYDRODIURIL) tablet 25 mg  25 mg Oral Daily Clapacs, John T, MD      . magnesium hydroxide (MILK OF MAGNESIA) suspension 30 mL  30 mL Oral Daily PRN Lamont Dowdy, NP        Lab Results: No results found for this or any previous visit (from the past 48 hour(s)).  Blood Alcohol level:  Lab Results  Component Value Date   ETH 144 (H) 06/03/2019    ETH <5 84/69/6295    Metabolic Disorder Labs: No results found for: HGBA1C, MPG No results found for: PROLACTIN Lab Results  Component Value Date   CHOL 176 07/02/2017   TRIG 198 (H) 07/02/2017   HDL 31 (L) 07/02/2017   CHOLHDL 5.7 07/02/2017   VLDL 40 07/02/2017   LDLCALC 105 (H) 07/02/2017    Physical Findings: AIMS:  , ,  ,  ,    CIWA:    COWS:     Musculoskeletal: Strength & Muscle Tone: within normal limits Gait & Station: normal Patient leans: N/A  Psychiatric Specialty Exam: Physical Exam  Nursing note and vitals reviewed. Constitutional: He appears well-developed and well-nourished.  HENT:  Head: Normocephalic and atraumatic.  Eyes: Pupils are equal, round, and reactive to light. Conjunctivae are normal.  Neck: Normal range of motion.  Cardiovascular: Regular rhythm and normal heart sounds.  Respiratory: Effort normal. No respiratory distress.  GI: Soft.  Musculoskeletal: Normal range of motion.  Neurological: He is alert.  Skin: Skin is warm and dry.  Psychiatric: He has a normal mood and affect. His behavior is normal. Judgment and thought content normal.    Review of Systems  Constitutional: Negative.   HENT: Negative.   Eyes: Negative.   Respiratory: Negative.   Cardiovascular: Negative.   Gastrointestinal: Negative.   Musculoskeletal: Negative.   Skin: Negative.   Neurological: Negative.   Psychiatric/Behavioral: Negative for depression, hallucinations, memory loss, substance abuse and suicidal ideas. The patient is nervous/anxious. The patient does not have insomnia.     Blood pressure (!) 155/103, pulse 72, temperature 98.2 F (36.8 C), temperature source Oral, resp. rate 18, height 6' (1.829 m), weight 113.9 kg, SpO2 100 %.Body mass index is 34.04 kg/m.  General Appearance: Casual  Eye Contact:  Fair  Speech:  Slow  Volume:  Decreased  Mood:  Euthymic  Affect:  Constricted  Thought Process:  Goal Directed  Orientation:  Full (Time,  Place, and Person)  Thought Content:  Logical  Suicidal Thoughts:  No  Homicidal Thoughts:  No  Memory:  Immediate;   Fair Recent;   Fair Remote;   Fair  Judgement:  Fair  Insight:  Shallow  Psychomotor Activity:  Decreased  Concentration:  Concentration: Fair  Recall:  [employer] of Knowledge:  Fair  Language:  Fair  Akathisia:  No  Handed:  Right  AIMS (if indicated):     Assets:  Desire for Improvement Housing Physical Health  ADL's:  Intact  Cognition:  WNL  Sleep:  Number of Hours: 7     Treatment Plan Summary: Daily contact with patient to  assess and evaluate symptoms and progress in treatment, Medication management and Plan Tolerating medication okay.  I have increased his blood pressure medicine because it is still quite abnormal.  Psychoeducation and review of outpatient treatment plan.  Likely discharge tomorrow.  Alethia Berthold, MD 06/06/2019, 3:20 PM

## 2019-06-06 NOTE — Plan of Care (Signed)
Patient states "I feel happy today.I feel myself today."Patient is appropriate with staff & peers.Denies SI,HI and AVH.Appetite and energy level good.Compliant with medications.Support and encouragement given.

## 2019-06-06 NOTE — Progress Notes (Signed)
Recreation Therapy Notes  INPATIENT RECREATION THERAPY ASSESSMENT  Patient Details Name: Roger Pennington MRN: [id number] DOB: Jan 12, 1976 Today's Date: 06/06/2019       Information Obtained From: Patient  Able to Participate in Assessment/Interview: Yes  Patient Presentation: Responsive  Reason for Admission (Per Patient): Active Symptoms, Suicidal Ideation, Suicide Attempt, Other (Comments)(Depression)  Patient Stressors: Relationship  Coping Skills:   Isolation, TV, Music, Talk, Avoidance  Leisure Interests (2+):  Social - Family(Dominoes, Driving)  Frequency of Recreation/Participation: Monthly  Awareness of Community Resources:     Community Resources:     Current Use:    If no, Barriers?:    Expressed Interest in [place] of Residence:  Insurance underwriter  Patient Main Form of Transportation: Musician  Patient Strengths:  Interior and spatial designer  Patient Identified Areas of Improvement:  Deleting social media  Patient Goal for Hospitalization:  Find somone to talk to  Current SI (including self-harm):  No  Current HI:  No  Current AVH: No  Staff Intervention Plan: Group Attendance, Collaborate with Interdisciplinary Treatment Team  Consent to Intern Participation: N/A  Dwanna Goshert 06/06/2019, 12:04 PM

## 2019-06-06 NOTE — Progress Notes (Signed)
Recreation Therapy Notes   Date: 06/06/2019  Time: 9:30 am  Location: Craft room  Behavioral response: Appropriate  Intervention Topic: Relaxation   Discussion/Intervention:  Group content today was focused on relaxation. The group defined relaxation and identified healthy ways to relax. Individuals expressed how much time they spend relaxing. Patients expressed how much their life would be if they did not make time for themselves to relax. The group stated ways they could improve their relaxation techniques in the future.  Individuals participated in the intervention "Time to Relax" where they had a chance to experience different relaxation techniques Clinical Observations/Feedback:  Patient came to group and defined relaxation as deep breaths. He identified watching movies and listening to music as ways he tries to relax. Individual was social with peers and staff while participating in the intervention. Jiyah Torpey LRT/CTRS         Mylin Gignac 06/06/2019 11:38 AM

## 2019-06-06 NOTE — Tx Team (Addendum)
Interdisciplinary Treatment and Diagnostic Plan Update  06/06/2019 Time of Session: 230p Roger Pennington MRN: [id number]  Principal Diagnosis: Severe recurrent major depression without psychotic features [facility])  Secondary Diagnoses: Principal Problem:   Severe recurrent major depression without psychotic features (HCC) Active Problems:   Depression with suicidal ideation   Suicide attempt [facility])   Essential hypertension   Social anxiety disorder   Current Medications:  Current Facility-Administered Medications  Medication Dose Route Frequency Provider Last Rate Last Dose  . alum & mag hydroxide-simeth (MAALOX/MYLANTA) 200-200-20 MG/5ML suspension 30 mL  30 mL Oral Q4H PRN Thomspon, Adela Lank, NP      . FLUoxetine (PROZAC) capsule 20 mg  20 mg Oral Daily Clapacs, John T, MD   20 mg at 06/06/19 0852  . hydrochlorothiazide (MICROZIDE) capsule 12.5 mg  12.5 mg Oral Daily Clapacs, Jackquline Denmark, MD   12.5 mg at 06/06/19 0852  . magnesium hydroxide (MILK OF MAGNESIA) suspension 30 mL  30 mL Oral Daily PRN Catalina Gravel, NP       PTA Medications: Medications Prior to Admission  Medication Sig Dispense Refill Last Dose  . amLODipine (NORVASC) 5 MG tablet Take 1 tablet (5 mg total) by mouth once. 30 tablet 0   . brompheniramine-pseudoephedrine-DM 30-2-10 MG/5ML syrup Take 5 mLs by mouth 4 (four) times daily as needed. (Patient not taking: Reported on 12/02/2018) 120 mL 0   . butalbital-acetaminophen-caffeine (FIORICET, ESGIC) 50-325-40 MG tablet Take 1-2 tablets by mouth every 6 (six) hours as needed. 20 tablet 0   . hydrochlorothiazide (HYDRODIURIL) 25 MG tablet Take 1 tablet (25 mg total) by mouth daily. 30 tablet 1     Patient Stressors: Financial difficulties Substance abuse  Patient Strengths: Ability for insight Communication skills Supportive family/friends  Treatment Modalities: Medication Management, Group therapy, Case management,  1 to 1 session with clinician,  Psychoeducation, Recreational therapy.   Physician Treatment Plan for Primary Diagnosis: Severe recurrent major depression without psychotic features (HCC) Long Term Goal(s): Improvement in symptoms so as ready for discharge Improvement in symptoms so as ready for discharge   Short Term Goals: Ability to verbalize feelings will improve Ability to disclose and discuss suicidal ideas Ability to demonstrate self-control will improve Compliance with prescribed medications will improve  Medication Management: Evaluate patient's response, side effects, and tolerance of medication regimen.  Therapeutic Interventions: 1 to 1 sessions, Unit Group sessions and Medication administration.  Evaluation of Outcomes: Progressing  Physician Treatment Plan for Secondary Diagnosis: Principal Problem:   Severe recurrent major depression without psychotic features (HCC) Active Problems:   Depression with suicidal ideation   Suicide attempt [facility])   Essential hypertension   Social anxiety disorder  Long Term Goal(s): Improvement in symptoms so as ready for discharge Improvement in symptoms so as ready for discharge   Short Term Goals: Ability to verbalize feelings will improve Ability to disclose and discuss suicidal ideas Ability to demonstrate self-control will improve Compliance with prescribed medications will improve     Medication Management: Evaluate patient's response, side effects, and tolerance of medication regimen.  Therapeutic Interventions: 1 to 1 sessions, Unit Group sessions and Medication administration.  Evaluation of Outcomes: Progressing   RN Treatment Plan for Primary Diagnosis: Severe recurrent major depression without psychotic features (HCC) Long Term Goal(s): Knowledge of disease and therapeutic regimen to maintain health will improve  Short Term Goals: Ability to verbalize feelings will improve, Ability to disclose and discuss suicidal ideas, Ability to identify and  develop effective coping behaviors will improve  and Compliance with prescribed medications will improve  Medication Management: RN will administer medications as ordered by provider, will assess and evaluate patient's response and provide education to patient for prescribed medication. RN will report any adverse and/or side effects to prescribing provider.  Therapeutic Interventions: 1 on 1 counseling sessions, Psychoeducation, Medication administration, Evaluate responses to treatment, Monitor vital signs and CBGs as ordered, Perform/monitor CIWA, COWS, AIMS and Fall Risk screenings as ordered, Perform wound care treatments as ordered.  Evaluation of Outcomes: Progressing   LCSW Treatment Plan for Primary Diagnosis: Severe recurrent major depression without psychotic features ([place]) Long Term Goal(s): Safe transition to appropriate next level of care at discharge, Engage patient in therapeutic group addressing interpersonal concerns.  Short Term Goals: Engage patient in aftercare planning with referrals and resources  Therapeutic Interventions: Assess for all discharge needs, 1 to 1 time with Social worker, Explore available resources and support systems, Assess for adequacy in community support network, Educate family and significant other(s) on suicide prevention, Complete Psychosocial Assessment, Interpersonal group therapy.  Evaluation of Outcomes: Progressing   Progress in Treatment: Attending groups: Yes. Participating in groups: Yes. Taking medication as prescribed: Yes. Toleration medication: Yes. Family/Significant other contact made: No, will contact:  pt declined Patient understands diagnosis: Yes. Discussing patient identified problems/goals with staff: Yes. Medical problems stabilized or resolved: Yes. Denies suicidal/homicidal ideation: Yes. Issues/concerns per patient self-inventory: No. Other: NA  New problem(s) identified: No, Describe:  none reported  New Short  Term/Long Term Goal(s):Attend outpatient treatment, take medication as prescribed, develop and implement healthy coping methods to manage stressors.  Patient Goals:  "Get the help that Lanae Boast said he'll get me"  Discharge Plan or Barriers: Pt will discharge on 06/07/19 and follow up at [facility].  Reason for Continuation of Hospitalization: Medication stabilization  Estimated Length of Stay: D/C 6/202/20  Recreational Therapy: Patient Stressors: Relationship  Patient Goal: Patient will engage in groups without prompting or encouragement from LRT x3 group sessions within 5 recreation therapy group sessions    Attendees: Patient:Roger Pennington 06/06/2019 3:07 PM  Physician: Alethia Berthold 06/06/2019 3:07 PM  Nursing: Jerry Caras 06/06/2019 3:07 PM  RN Care Manager: 06/06/2019 3:07 PM  Social Worker: Anise Salvo 06/06/2019 3:07 PM  Recreational Therapist: Isaias Sakai Maliek Schellhorn 06/06/2019 3:07 PM  Other:  06/06/2019 3:07 PM  Other:  06/06/2019 3:07 PM  Other: 06/06/2019 3:07 PM    Scribe for Treatment Team: Yvette Rack, LCSW 06/06/2019 3:07 PM

## 2019-06-06 NOTE — Progress Notes (Signed)
Recreation Therapy Notes  INPATIENT RECREATION TR PLAN  Patient Details Name: Roger Pennington MRN: [id number] DOB: 12/23/1975 Today's Date: 06/06/2019  Rec Therapy Plan Is patient appropriate for Therapeutic Recreation?: Yes Treatment times per week: at least 3 Estimated Length of Stay: 5-7 Days TR Treatment/Interventions: Group participation (Comment)  Discharge Criteria Pt will be discharged from therapy if:: Discharged Treatment plan/goals/alternatives discussed and agreed upon by:: Patient/family  Discharge Summary Short term goals set: Patient will engage in groups without prompting or encouragement from LRT x3 group sessions within 5 recreation therapy group sessions Short term goals met: Adequate for discharge Progress toward goals comments: Groups attended Which groups?: Other (Comment)(Relaxation) Reason goals not met: N/A Therapeutic equipment acquired: N/A Reason patient discharged from therapy: Discharge from hospital Pt/family agrees with progress & goals achieved: Yes Date patient discharged from therapy: 06/07/19   Roger Pennington 06/06/2019, 2:55 PM

## 2019-06-06 NOTE — BHH Group Notes (Signed)
LCSW Group Therapy Note  06/06/2019 2:33 PM  Type of Therapy and Topic:  Group Therapy:  Feelings around Relapse and Recovery  Participation Level:  Active   Description of Group:    Patients in this group will discuss emotions they experience before and after a relapse. They will process how experiencing these feelings, or avoidance of experiencing them, relates to having a relapse. Facilitator will guide patients to explore emotions they have related to recovery. Patients will be encouraged to process which emotions are more powerful. They will be guided to discuss the emotional reaction significant others in their lives may have to their relapse or recovery. Patients will be assisted in exploring ways to respond to the emotions of others without this contributing to a relapse.  Therapeutic Goals: 1. Patient will identify two or more emotions that lead to a relapse for them 2. Patient will identify two emotions that result when they relapse 3. Patient will identify two emotions related to recovery 4. Patient will demonstrate ability to communicate their needs through discussion and/or role plays   Summary of Patient Progress: Patient was present and attentive in group.  Patient engaged in group discussion.  He reports that "loneliness, boredom, neglect and love" have been triggers to his relapse.  He furhter reports that he experiences "relief" when he has experienced recovery.  He identifies spending time with others are his coping skill.    Therapeutic Modalities:   Cognitive Behavioral Therapy Solution-Focused Therapy Assertiveness Training Relapse Prevention Therapy   Assunta Curtis, MSW, LCSW 06/06/2019 2:33 PM

## 2019-06-06 NOTE — Plan of Care (Signed)
Patient didn't have any medications scheduled this evening but was compliant with procedures on the unit.   Problem: Medication: Goal: Compliance with prescribed medication regimen will improve Outcome: Not Progressing

## 2019-06-07 MED ORDER — HYDROCHLOROTHIAZIDE 25 MG PO TABS: 25.0000 mg | ORAL_TABLET | Freq: Every day | ORAL | 1 refills | Status: DC

## 2019-06-07 MED ORDER — AMLODIPINE BESYLATE 5 MG PO TABS: 5.0000 mg | ORAL_TABLET | Freq: Every day | ORAL | 1 refills | Status: DC

## 2019-06-07 MED ORDER — FLUOXETINE HCL 20 MG PO CAPS: 20.0000 mg | ORAL_CAPSULE | Freq: Every day | ORAL | 1 refills | Status: DC

## 2019-06-07 NOTE — Progress Notes (Signed)
Patient alert and oriented x 4. Ambulates unit with steady gait. Verbally denies SI/HI/AVH and pain. Patient discharged on above date and time. Verbalized understanding the discharge information provided to patient upon discharge. Patient departed unit with discharge paperwork, prescriptions, 7 day supply of medications and personal belongings. Picked up by his sister and plans to follow up with [facility].

## 2019-06-07 NOTE — Progress Notes (Signed)
  [facility] Adult Case Management Discharge Plan :  Will you be returning to the same living situation after discharge:  Yes,  pt is returning home. At discharge, do you have transportation home?: Yes,  pt's mother will provide transportation. Do you have the ability to pay for your medications: No.  Release of information consent forms completed and in the chart;  Patient's signature needed at discharge.  Patient to Follow up at: Follow-up Information    [place] on 06/09/2019.   Why: You are scheduled to follow up at [facility] on Monday, June 09, 2019 at 8am; Sherrian Divers will contact you at that time. Please have hospital discharge paperwork available. Thank you. Contact information: [place] [address] [phone]           Next level of care provider has access to [place] and Suicide Prevention discussed: Yes,  SPE completed.  Have you used any form of tobacco in the last 30 days? (Cigarettes, Smokeless Tobacco, Cigars, and/or Pipes): Yes  Has patient been referred to the Quitline?: Patient refused referral  Patient has been referred for addiction treatment: [place], LCSW 06/07/2019, 9:53 AM

## 2019-06-07 NOTE — Discharge Summary (Signed)
Physician Discharge Summary Note  Patient:  Roger Pennington is an 42 y.o., male MRN:  [id number] DOB:  1975-12-28 Patient phone:  [phone] (home)  Patient address:   [place] [address],  Total Time spent with patient: 45 minutes  Date of Admission:  06/04/2019 Date of Discharge: June 07, 2019  Reason for Admission: Admitted to the hospital after stabilizing from an overdose on acetaminophen  Principal Problem: Severe recurrent major depression without psychotic features [facility]) Discharge Diagnoses: Principal Problem:   Severe recurrent major depression without psychotic features ([place]) Active Problems:   Depression with suicidal ideation   Suicide attempt [facility])   Essential hypertension   Social anxiety disorder   Past Psychiatric History: Patient has a past history of depression with previous impulsive suicidality.  History of noncompliance.  Past Medical History:  Past Medical History:  Diagnosis Date  . Tobacco abuse     Past Surgical History:  Procedure Laterality Date  . ESOPHAGOGASTRODUODENOSCOPY (EGD) WITH PROPOFOL N/A 02/09/2016   Procedure: ESOPHAGOGASTRODUODENOSCOPY (EGD) WITH PROPOFOL;  Surgeon: Lollie Sails, MD;  Location: [facility] ENDOSCOPY;  Service: Endoscopy;  Laterality: N/A;   Family History:  Family History  Problem Relation Age of Onset  . Hypertension Father   . Hypertension Brother    Family Psychiatric  History: None reported Social History:  Social History   Substance and Sexual Activity  Alcohol Use Yes  . Alcohol/week: 0.0 standard drinks   Comment: occ     Social History   Substance and Sexual Activity  Drug Use No    Social History   Socioeconomic History  . Marital status: Single    Spouse name: Not on file  . Number of children: Not on file  . Years of education: Not on file  . Highest education level: Not on file  Occupational History  . Not on file  Social Needs  . Financial resource strain: Not on  file  . Food insecurity    Worry: Not on file    Inability: Not on file  . Transportation needs    Medical: Not on file    Non-medical: Not on file  Tobacco Use  . Smoking status: Current Every Day Smoker  . Smokeless tobacco: Never Used  Substance and Sexual Activity  . Alcohol use: Yes    Alcohol/week: 0.0 standard drinks    Comment: occ  . Drug use: No  . Sexual activity: Not on file  Lifestyle  . Physical activity    Days per week: Not on file    Minutes per session: Not on file  . Stress: Not on file  Relationships  . Social Herbalist on phone: Not on file    Gets together: Not on file    Attends religious service: Not on file    Active member of club or organization: Not on file    Attends meetings of clubs or organizations: Not on file    Relationship status: Not on file  Other Topics Concern  . Not on file  Social History Narrative  . Not on file    Hospital Course: Admitted to the psychiatric ward.  15-minute checks employed.  Patient did not display any violent or dangerous behavior in the hospital.  He was cooperative with the evaluation and treatment.  Met with treatment team and also met independently with representative from [place].  Patient was able to show good insight identifying depressive symptoms and behaviors.  He was agreeable  to starting on a modest dose of fluoxetine.  Psychoeducation done about the importance of medication compliance as well as outpatient therapy compliance.  Psychoeducation about the importance of staying away from alcohol and drug abuse.  Patient expressed understanding of all of this.  Requested discharge by Saturday of this week.  Patient was discharged home with a firm agreement that he would notify the representative from [place] and would get follow-up treatment this week.  Blood pressure was elevated but got under better control in the hospital.  Reviewed with the patient his blood pressure medicine.  Physical Findings: AIMS:   , ,  ,  ,    CIWA:    COWS:     Musculoskeletal: Strength & Muscle Tone: within normal limits Gait & Station: normal Patient leans: N/A  Psychiatric Specialty Exam: Physical Exam  Nursing note and vitals reviewed. Constitutional: He appears well-developed and well-nourished.  HENT:  Head: Normocephalic and atraumatic.  Eyes: Pupils are equal, round, and reactive to light. Conjunctivae are normal.  Neck: Normal range of motion.  Cardiovascular: Regular rhythm and normal heart sounds.  Respiratory: Effort normal. No respiratory distress.  GI: Soft.  Musculoskeletal: Normal range of motion.  Neurological: He is alert.  Skin: Skin is warm and dry.  Psychiatric: He has a normal mood and affect. His speech is normal and behavior is normal. Judgment and thought content normal. Cognition and memory are normal.    Review of Systems  Constitutional: Negative.   HENT: Negative.   Eyes: Negative.   Respiratory: Negative.   Cardiovascular: Negative.   Gastrointestinal: Negative.   Musculoskeletal: Negative.   Skin: Negative.   Neurological: Negative.   Psychiatric/Behavioral: Negative.     Blood pressure 139/89, pulse 70, temperature 98.2 F (36.8 C), temperature source Oral, resp. rate 18, height 6' (1.829 m), weight 113.9 kg, SpO2 99 %.Body mass index is 34.04 kg/m.  General Appearance: Fairly Groomed  Eye Contact:  Good  Speech:  Clear and Coherent  Volume:  Normal  Mood:  Euthymic  Affect:  Flat  Thought Process:  Coherent  Orientation:  Full (Time, Place, and Person)  Thought Content:  Illogical  Suicidal Thoughts:  No  Homicidal Thoughts:  No  Memory:  Immediate;   Fair Recent;   Fair Remote;   Fair  Judgement:  Fair  Insight:  Fair  Psychomotor Activity:  Normal  Concentration:  Concentration: Fair  Recall:  [employer] of Knowledge:  Fair  Language:  Fair  Akathisia:  No  Handed:  Right  AIMS (if indicated):     Assets:  Desire for Improvement Physical  Health Resilience  ADL's:  Intact  Cognition:  WNL  Sleep:  Number of Hours: 6.3     Have you used any form of tobacco in the last 30 days? (Cigarettes, Smokeless Tobacco, Cigars, and/or Pipes): Yes  Has this patient used any form of tobacco in the last 30 days? (Cigarettes, Smokeless Tobacco, Cigars, and/or Pipes) Yes, Yes, A prescription for an FDA-approved tobacco cessation medication was offered at discharge and the patient refused  Blood Alcohol level:  Lab Results  Component Value Date   ETH 144 (H) 06/03/2019   ETH <5 16/09/9603    Metabolic Disorder Labs:  No results found for: HGBA1C, MPG No results found for: PROLACTIN Lab Results  Component Value Date   CHOL 176 07/02/2017   TRIG 198 (H) 07/02/2017   HDL 31 (L) 07/02/2017   CHOLHDL 5.7 07/02/2017   VLDL 40  07/02/2017   [place] 105 (H) 07/02/2017    See Psychiatric Specialty Exam and Suicide Risk Assessment completed by Attending Physician prior to discharge.  Discharge destination:  Home  Is patient on multiple antipsychotic therapies at discharge:  No   Has Patient had three or more failed trials of antipsychotic monotherapy by history:  No  Recommended Plan for Multiple Antipsychotic Therapies: NA  Discharge Instructions    Diet - low sodium heart healthy   Complete by: As directed    Increase activity slowly   Complete by: As directed      Allergies as of 06/07/2019   No Known Allergies     Medication List    STOP taking these medications   brompheniramine-pseudoephedrine-DM 30-2-10 MG/5ML syrup   butalbital-acetaminophen-caffeine 50-325-40 MG tablet Commonly known as: FIORICET     TAKE these medications     Indication  amLODipine 5 MG tablet Commonly known as: NORVASC Take 1 tablet (5 mg total) by mouth daily. What changed: when to take this  Indication: High Blood Pressure Disorder   FLUoxetine 20 MG capsule Commonly known as: PROZAC Take 1 capsule (20 mg total) by mouth daily.   Indication: Depression   hydrochlorothiazide 25 MG tablet Commonly known as: HYDRODIURIL Take 1 tablet (25 mg total) by mouth daily.  Indication: High Blood Pressure Disorder      Follow-up Information    [place] on 06/09/2019.   Why: You are scheduled to follow up at [facility] on Monday, June 09, 2019 at 8am; Sherrian Divers will contact you at that time. Please have hospital discharge paperwork available. Thank you. Contact information: [place] [address] [phone]           Follow-up recommendations:  Activity:  Activity as tolerated Diet:  Regular diet Other:  Follow-up with [facility].  Avoid alcohol abuse.  Give medication a real try.  Comments: 7-day supply as well as prescriptions provided at discharge  Signed: Alethia Berthold, MD 06/07/2019, 1:29 PM

## 2019-06-07 NOTE — [facility] Suicide Risk Assessment (Signed)
[facility] Discharge Suicide Risk Assessment   Principal Problem: Severe recurrent major depression without psychotic features [facility]) Discharge Diagnoses: Principal Problem:   Severe recurrent major depression without psychotic features ([place]) Active Problems:   Depression with suicidal ideation   Suicide attempt ([place])   Essential hypertension   Social anxiety disorder   Total Time spent with patient: 30 minutes  Musculoskeletal: Strength & Muscle Tone: within normal limits Gait & Station: normal Patient leans: N/A  Psychiatric Specialty Exam: Review of Systems  Constitutional: Negative.   HENT: Negative.   Eyes: Negative.   Respiratory: Negative.   Cardiovascular: Negative.   Gastrointestinal: Negative.   Musculoskeletal: Negative.   Skin: Negative.   Neurological: Negative.   Psychiatric/Behavioral: Negative.     Blood pressure (!) 137/93, pulse 73, temperature 98.1 F (36.7 C), temperature source Oral, resp. rate 18, height 6' (1.829 m), weight 113.9 kg, SpO2 100 %.Body mass index is 34.04 kg/m.  General Appearance: Fairly Groomed  Engineer, water::  Good  Speech:  Clear and [id number]  Volume:  Normal  Mood:  Euthymic  Affect:  Congruent  Thought Process:  Goal Directed  Orientation:  Full (Time, Place, and Person)  Thought Content:  Logical  Suicidal Thoughts:  No  Homicidal Thoughts:  No  Memory:  Immediate;   Fair Recent;   Fair Remote;   Fair  Judgement:  Fair  Insight:  Fair  Psychomotor Activity:  Normal  Concentration:  Fair  Recall:  [employer] of Knowledge:Fair  Language: Fair  Akathisia:  No  Handed:  Right  AIMS (if indicated):     Assets:  Desire for Improvement Housing Physical Health Resilience Social Support  Sleep:  Number of Hours: 6.3  Cognition: WNL  ADL's:  Intact   Mental Status Per Nursing Assessment::   On Admission:  Self-harm thoughts  Demographic Factors:  Male, Divorced or widowed, Low socioeconomic status and Living  alone  Loss Factors: Loss of significant relationship  Historical Factors: Impulsivity  Risk Reduction Factors:   Responsible for children under 76 years of age, Sense of responsibility to family and Positive therapeutic relationship  Continued Clinical Symptoms:  Depression:   Impulsivity  Cognitive Features That Contribute To Risk:  Polarized thinking    Suicide Risk:  Minimal: No identifiable suicidal ideation.  Patients presenting with no risk factors but with morbid ruminations; may be classified as minimal risk based on the severity of the depressive symptoms  Follow-up Information    [place] on 06/09/2019.   Why: You are scheduled to follow up at [facility] on Monday, June 09, 2019 at 8am; Sherrian Divers will contact you at that time. Please have hospital discharge paperwork available. Thank you. Contact information: [place] [address] [phone]           Plan Of Care/Follow-up recommendations:  Activity:  as tolerated Diet:  regular - limit salt Other:  follow up with Mr. Altha Harm, MD 06/07/2019, 9:51 AM

## 2019-06-07 NOTE — Plan of Care (Signed)
Patient is calm and stable , responding well to treatment regimen and denies any SI/HI/AVH and no signs of delusions or hallucinations , appetite is good , mood is appropriate , patient is maintaining safety in the unit, participate is scheduled social activities ,sleep is continuous and only requiring 15 minutes safety check and no distress.   Problem: Education: Goal: Knowledge of [place] Education information/materials will improve Description: Instructed on [place] Education , unit programing , able to verbalize understanding of information received . Outcome: Progressing   Problem: Coping: Goal: Coping ability will improve Outcome: Progressing   Problem: Medication: Goal: Compliance with prescribed medication regimen will improve Outcome: Progressing   Problem: Safety: Goal: Ability to disclose and discuss suicidal ideas will improve Outcome: Progressing

## 2020-03-17 ENCOUNTER — Emergency Department
Admission: EM | Admit: 2020-03-17 | Discharge: 2020-03-17 | Disposition: A | Discharge status: Home / Self Care | Payer: Self-pay | Attending: Emergency Medicine | Admitting: Emergency Medicine

## 2020-03-17 ENCOUNTER — Other Ambulatory Visit: Payer: Self-pay

## 2020-03-17 DIAGNOSIS — I10 Essential (primary) hypertension: Secondary | ICD-10-CM | POA: Insufficient documentation

## 2020-03-17 DIAGNOSIS — F17210 Nicotine dependence, cigarettes, uncomplicated: Secondary | ICD-10-CM | POA: Insufficient documentation

## 2020-03-17 DIAGNOSIS — Z79899 Other long term (current) drug therapy: Secondary | ICD-10-CM | POA: Insufficient documentation

## 2020-03-17 DIAGNOSIS — E119 Type 2 diabetes mellitus without complications: Secondary | ICD-10-CM | POA: Insufficient documentation

## 2020-03-17 LAB — URINALYSIS, COMPLETE (UACMP) WITH MICROSCOPIC
Bacteria, UA: NONE SEEN
Bilirubin Urine: NEGATIVE
Glucose, UA: >=500 mg/dL — AB
Hgb urine dipstick: NEGATIVE
Ketones, ur: 20 mg/dL — AB
Leukocytes,Ua: NEGATIVE
Nitrite: NEGATIVE
Protein, ur: NEGATIVE mg/dL
Specific Gravity, Urine: 1.036 — ABNORMAL HIGH (ref 1.005–1.030)
pH: 6 (ref 5.0–8.0)

## 2020-03-17 LAB — CBC
HCT: 43.5 % (ref 39.0–52.0)
Hemoglobin: 13.8 g/dL (ref 13.0–17.0)
MCH: 25 pg — ABNORMAL LOW (ref 26.0–34.0)
MCHC: 31.7 g/dL (ref 30.0–36.0)
MCV: 78.9 fL — ABNORMAL LOW (ref 80.0–100.0)
Platelets: 181 10*3/uL (ref 150–400)
RBC: 5.51 MIL/uL (ref 4.22–5.81)
RDW: 11.9 % (ref 11.5–15.5)
WBC: 6.8 10*3/uL (ref 4.0–10.5)
nRBC: 0 % (ref 0.0–0.2)

## 2020-03-17 LAB — BASIC METABOLIC PANEL
Anion gap: 9 (ref 5–15)
BUN: 17 mg/dL (ref 6–20)
CO2: 25 mmol/L (ref 22–32)
Calcium: 9.5 mg/dL (ref 8.9–10.3)
Chloride: 103 mmol/L (ref 98–111)
Creatinine, Ser: 1.23 mg/dL (ref 0.61–1.24)
GFR calc Af Amer: >60 mL/min (ref >60)
GFR calc non Af Amer: >60 mL/min (ref >60)
Glucose, Bld: 386 mg/dL — ABNORMAL HIGH (ref 70–99)
Potassium: 4.3 mmol/L (ref 3.5–5.1)
Sodium: 137 mmol/L (ref 135–145)

## 2020-03-17 LAB — GLUCOSE, CAPILLARY
Glucose-Capillary: 383 mg/dL — ABNORMAL HIGH (ref 70–99)
Glucose-Capillary: 330 mg/dL — ABNORMAL HIGH (ref 70–99)
Glucose-Capillary: 305 mg/dL — ABNORMAL HIGH (ref 70–99)
Glucose-Capillary: 291 mg/dL — ABNORMAL HIGH (ref 70–99)

## 2020-03-17 MED ORDER — SODIUM CHLORIDE 0.9 % IV BOLUS
1500.0000 mL | Freq: Once | INTRAVENOUS | Status: AC
  Administered 2020-03-17: 12:00:00 1500 mL via INTRAVENOUS

## 2020-03-17 MED ORDER — METFORMIN HCL 500 MG PO TABS: 500.0000 mg | ORAL_TABLET | Freq: Two times a day (BID) | ORAL | 0 refills | Status: DC

## 2020-03-17 MED ORDER — SODIUM CHLORIDE 0.9 % IV BOLUS
500.0000 mL | Freq: Once | INTRAVENOUS | Status: AC
  Administered 2020-03-17: 15:00:00 500 mL via INTRAVENOUS

## 2020-03-17 MED ORDER — METFORMIN HCL 500 MG PO TABS
500.0000 mg | ORAL_TABLET | Freq: Once | ORAL | Status: AC
  Administered 2020-03-17: 500 mg via ORAL
  Filled 2020-03-17: qty 1

## 2020-03-17 NOTE — ED Notes (Signed)
Pt ambulatory to bathroom with steady gait.

## 2020-03-17 NOTE — ED Provider Notes (Signed)
[facility] Emergency Department Provider Note   ____________________________________________   First MD Initiated Contact with Patient 03/17/20 1138     (approximate)  I have reviewed the triage vital signs and the nursing notes.   HISTORY  Chief Complaint Hyperglycemia    HPI Brewer Hitchman is a 44 y.o. male reports no significant past medical history except for history of depression  Patient reports that over the last few days he has noticed that he has been having to go to urinate frequently and been feeling very thirsty.  He is drink a lot of water.  His family checked his blood sugar today and it was elevated.  No recent illness.  No fevers or chills.  Reports a mild headache starting after his symptoms started  Ports he just feels like he cannot get enough water to drink  No nausea or vomiting.  No abdominal pain.   Past Medical History:  Diagnosis Date  . Tobacco abuse     Patient Active Problem List   Diagnosis Date Noted  . Severe recurrent major depression without psychotic features (HCC) 06/05/2019  . Suicide attempt (HCC) 06/05/2019  . Essential hypertension 06/05/2019  . Social anxiety disorder 06/05/2019  . Intentional acetaminophen overdose (HCC) 06/04/2019  . Depression with suicidal ideation 06/04/2019  . Severe recurrent major depression (HCC) 07/02/2017  . Tobacco use disorder 07/02/2017  . Cannabis use disorder, severe, dependence (HCC) 07/02/2017  . Upper GI bleed 02/08/2016    Past Surgical History:  Procedure Laterality Date  . ESOPHAGOGASTRODUODENOSCOPY (EGD) WITH PROPOFOL N/A 02/09/2016   Procedure: ESOPHAGOGASTRODUODENOSCOPY (EGD) WITH PROPOFOL;  Surgeon: Christena Deem, MD;  Location: [facility] ENDOSCOPY;  Service: Endoscopy;  Laterality: N/A;    Prior to Admission medications   Medication Sig Start Date End Date Taking? Authorizing Provider  amLODipine (NORVASC) 5 MG tablet Take 1 tablet (5 mg total) by  mouth daily. 06/07/19   Clapacs, Jackquline Denmark, MD  FLUoxetine (PROZAC) 20 MG capsule Take 1 capsule (20 mg total) by mouth daily. 06/07/19   Clapacs, Jackquline Denmark, MD  hydrochlorothiazide (HYDRODIURIL) 25 MG tablet Take 1 tablet (25 mg total) by mouth daily. 06/07/19   Clapacs, Jackquline Denmark, MD  metFORMIN (GLUCOPHAGE) 500 MG tablet Take 1 tablet (500 mg total) by mouth 2 (two) times daily with a meal. 03/17/20   Sharyn Creamer, MD    Allergies Patient has no known allergies.  Family History  Problem Relation Age of Onset  . Hypertension Father   . Hypertension Brother     Social History Social History   Tobacco Use  . Smoking status: Current Every Day Smoker  . Smokeless tobacco: Never Used  Substance Use Topics  . Alcohol use: Yes    Alcohol/week: 0.0 standard drinks    Comment: occ  . Drug use: No    Review of Systems Constitutional: No fever/chills but feeling rather fatigued the last couple of days Eyes: No visual changes. ENT: No sore throat.  Very dry mouth Cardiovascular: Denies chest pain. Respiratory: Denies shortness of breath. Gastrointestinal: No abdominal pain.   Genitourinary: Negative for dysuria.  Frequently urinating. Musculoskeletal: Negative for back pain. Skin: Negative for rash. Neurological: Negative for headaches, areas of focal weakness or numbness.  Reports strong family history of diabetes with both his parents having it  ____________________________________________   PHYSICAL EXAM:  VITAL SIGNS: ED Triage Vitals [03/17/20 1116]  Enc Vitals Group     BP 132/83     Pulse Rate 95  Resp 18     Temp 98.5 F (36.9 C)     Temp Source Oral     SpO2 98 %     Weight 270 lb (122.5 kg)     Height 6' (1.829 m)     Head Circumference      Peak Flow      Pain Score 8     Pain Loc      Pain Edu?      Excl. in [place]?     Constitutional: Alert and oriented. Well appearing and in no acute distress.  No confusion. Eyes: Conjunctivae are normal. Head:  Atraumatic. Nose: No congestion/rhinnorhea. Mouth/Throat: Mucous membranes are moderately dry. Neck: No stridor.  Cardiovascular: Normal rate, regular rhythm. Grossly normal heart sounds.  Good peripheral circulation. Respiratory: Normal respiratory effort.  No retractions. Lungs CTAB. Gastrointestinal: Soft and nontender. No distention. Musculoskeletal: No lower extremity tenderness nor edema. Neurologic:  Normal speech and language. No gross focal neurologic deficits are appreciated.  Skin:  Skin is warm, dry and intact. No rash noted. Psychiatric: Mood and affect are normal. Speech and behavior are normal.  ____________________________________________   LABS (all labs ordered are listed, but only abnormal results are displayed)  Labs Reviewed  BASIC METABOLIC PANEL - Abnormal; Notable for the following components:      Result Value   Glucose, Bld 386 (*)    All other components within normal limits  CBC - Abnormal; Notable for the following components:   MCV 78.9 (*)    MCH 25.0 (*)    All other components within normal limits  URINALYSIS, COMPLETE (UACMP) WITH MICROSCOPIC - Abnormal; Notable for the following components:   Color, Urine YELLOW (*)    APPearance CLEAR (*)    Specific Gravity, Urine 1.036 (*)    Glucose, UA >=500 (*)    Ketones, ur 20 (*)    All other components within normal limits  GLUCOSE, CAPILLARY - Abnormal; Notable for the following components:   Glucose-Capillary 383 (*)    All other components within normal limits  GLUCOSE, CAPILLARY - Abnormal; Notable for the following components:   Glucose-Capillary 330 (*)    All other components within normal limits  GLUCOSE, CAPILLARY - Abnormal; Notable for the following components:   Glucose-Capillary 305 (*)    All other components within normal limits  CBG MONITORING, ED  CBG MONITORING, ED  CBG MONITORING, ED  CBG MONITORING, ED  CBG MONITORING, ED  CBG MONITORING, ED  CBG MONITORING, ED  CBG  MONITORING, ED  CBG MONITORING, ED  CBG MONITORING, ED  CBG MONITORING, ED  CBG MONITORING, ED  CBG MONITORING, ED  CBG MONITORING, ED  CBG MONITORING, ED  CBG MONITORING, ED   ____________________________________________  EKG   ____________________________________________  RADIOLOGY   ____________________________________________   PROCEDURES  Procedure(s) performed: None  Procedures  Critical Care performed: No  ____________________________________________   INITIAL IMPRESSION / ASSESSMENT AND PLAN / ED COURSE  Pertinent labs & imaging results that were available during my care of the patient were reviewed by me and considered in my medical decision making (see chart for details).   Patient presents for concerns of polyuria polydipsia.  His clinical history and his lab work seems to be most indicative of adult onset diabetes.  Does have a strong family history of diabetes as well.  There is no evidence of DKA.  He is hemodynamically stable fully alert without confusion or altered mental status.  He denies any acute infectious  symptoms.  Very reassuring clinical exam  We will initiate IV fluids to hydrate, continue to check his glucoses, and discussed with him general care and management of what appears to be new onset diabetes.    ----------------------------------------- 2:46 PM on 03/17/2020 -----------------------------------------  Patient reports he is feeling a lot better.  Glucose is improved about 305.  He feels much better, counseled on diabetes treatment, need for follow-up and he reports he will be able to follow-up with [facility] on Tuesday.  We will prescribe Metformin, discussed common side effects adverse reaction to this medication with the patient.  Plan to give additional small fluid bolus, has now had Metformin, and recheck glucose.  My current goal is to get his glucose control to less than 300 prior to discharge.  Patient agreement  understanding plan.  Follow-up including reassessment of repeat glucose assigned to Dr. Erma Heritage  ____________________________________________   FINAL CLINICAL IMPRESSION(S) / ED DIAGNOSES  Final diagnoses:  Diabetes mellitus, new onset (HCC)        Note:  This document was prepared using Dragon voice recognition software and may include unintentional dictation errors       Sharyn Creamer, MD 03/17/20 1447

## 2020-03-17 NOTE — ED Provider Notes (Signed)
Patient is here with new onset DM. Mild dehydration noted but normal CO2, normal AG, no signs of DKA. Plan is to give fluids, repeat sugar. If improving, d/c with metformin and close outpatient follow-up at [facility]. No fever, no infectious sx or other signs of acute trigger for his new onset likely type 2 DM.   Shaune Pollack, MD 03/17/20 1517

## 2020-03-17 NOTE — ED Triage Notes (Signed)
Pt comes POV with new tiredness, drinking lots of water, urinating more than normal. Mother checked CBG and it was 431. Never been dx as diabetic.

## 2020-04-15 ENCOUNTER — Encounter (HOSPITAL_COMMUNITY): Payer: Self-pay | Admitting: Emergency Medicine

## 2020-04-15 ENCOUNTER — Other Ambulatory Visit: Payer: Self-pay

## 2020-04-15 ENCOUNTER — Emergency Department (HOSPITAL_COMMUNITY)
Admission: EM | Admit: 2020-04-15 | Discharge: 2020-04-16 | Disposition: A | Discharge status: Home / Self Care | Payer: Self-pay | Attending: Emergency Medicine | Admitting: Emergency Medicine

## 2020-04-15 DIAGNOSIS — Z5321 Procedure and treatment not carried out due to patient leaving prior to being seen by health care provider: Secondary | ICD-10-CM | POA: Insufficient documentation

## 2020-04-15 DIAGNOSIS — R5383 Other fatigue: Secondary | ICD-10-CM | POA: Insufficient documentation

## 2020-04-15 DIAGNOSIS — R519 Headache, unspecified: Secondary | ICD-10-CM | POA: Insufficient documentation

## 2020-04-15 HISTORY — DX: Essential (primary) hypertension: I10

## 2020-04-15 HISTORY — DX: Type 2 diabetes mellitus without complications: E11.9

## 2020-04-15 LAB — CBC WITH DIFFERENTIAL/PLATELET
Abs Immature Granulocytes: 0.01 10*3/uL (ref 0.00–0.07)
Basophils Absolute: 0 10*3/uL (ref 0.0–0.1)
Basophils Relative: 1 %
Eosinophils Absolute: 0.2 10*3/uL (ref 0.0–0.5)
Eosinophils Relative: 4 %
HCT: 36.8 % — ABNORMAL LOW (ref 39.0–52.0)
Hemoglobin: 11.5 g/dL — ABNORMAL LOW (ref 13.0–17.0)
Immature Granulocytes: 0 %
Lymphocytes Relative: 55 %
Lymphs Abs: 2.7 10*3/uL (ref 0.7–4.0)
MCH: 25.1 pg — ABNORMAL LOW (ref 26.0–34.0)
MCHC: 31.3 g/dL (ref 30.0–36.0)
MCV: 80.2 fL (ref 80.0–100.0)
Monocytes Absolute: 0.4 10*3/uL (ref 0.1–1.0)
Monocytes Relative: 9 %
Neutro Abs: 1.5 10*3/uL — ABNORMAL LOW (ref 1.7–7.7)
Neutrophils Relative %: 31 %
Platelets: 157 10*3/uL (ref 150–400)
RBC: 4.59 MIL/uL (ref 4.22–5.81)
RDW: 12.4 % (ref 11.5–15.5)
WBC: 4.8 10*3/uL (ref 4.0–10.5)
nRBC: 0 % (ref 0.0–0.2)

## 2020-04-15 LAB — URINALYSIS, ROUTINE W REFLEX MICROSCOPIC
Bacteria, UA: NONE SEEN
Bilirubin Urine: NEGATIVE
Glucose, UA: >=500 mg/dL — AB
Hgb urine dipstick: NEGATIVE
Ketones, ur: 20 mg/dL — AB
Leukocytes,Ua: NEGATIVE
Nitrite: NEGATIVE
Protein, ur: NEGATIVE mg/dL
Specific Gravity, Urine: 1.033 — ABNORMAL HIGH (ref 1.005–1.030)
pH: 6 (ref 5.0–8.0)

## 2020-04-15 LAB — COMPREHENSIVE METABOLIC PANEL
ALT: 38 U/L (ref 0–44)
AST: 23 U/L (ref 15–41)
Albumin: 4 g/dL (ref 3.5–5.0)
Alkaline Phosphatase: 79 U/L (ref 38–126)
Anion gap: 11 (ref 5–15)
BUN: 6 mg/dL (ref 6–20)
CO2: 23 mmol/L (ref 22–32)
Calcium: 9.3 mg/dL (ref 8.9–10.3)
Chloride: 103 mmol/L (ref 98–111)
Creatinine, Ser: 1.03 mg/dL (ref 0.61–1.24)
GFR calc Af Amer: >60 mL/min (ref >60)
GFR calc non Af Amer: >60 mL/min (ref >60)
Glucose, Bld: 366 mg/dL — ABNORMAL HIGH (ref 70–99)
Potassium: 3.7 mmol/L (ref 3.5–5.1)
Sodium: 137 mmol/L (ref 135–145)
Total Bilirubin: 1.1 mg/dL (ref 0.3–1.2)
Total Protein: 6.7 g/dL (ref 6.5–8.1)

## 2020-04-15 NOTE — ED Notes (Signed)
IV removed. Pt left.

## 2020-04-15 NOTE — ED Triage Notes (Signed)
Patient arrived with EMS from home , reports fatigue , headache and polyuria this week , CBG= 426 by EMS , he has not taken his diabetic medication for several days . Alert and oriented /respirations unlabored.

## 2020-04-15 NOTE — ED Notes (Signed)
Pt wanting IV removed.  Explained to pt reasoning for IV and that he would likely need another one if that one removed.  Pt states he is wanting to go home because someone told him he couldn't have water.  Informed pt that he can have water and encouraged him to stay.  Pt agreeable.

## 2020-05-10 ENCOUNTER — Encounter: Payer: Self-pay | Admitting: Emergency Medicine

## 2020-05-10 ENCOUNTER — Emergency Department
Admission: EM | Admit: 2020-05-10 | Discharge: 2020-05-10 | Disposition: A | Discharge status: Home / Self Care | Payer: Self-pay | Attending: Emergency Medicine | Admitting: Emergency Medicine

## 2020-05-10 ENCOUNTER — Other Ambulatory Visit: Payer: Self-pay

## 2020-05-10 DIAGNOSIS — Z7984 Long term (current) use of oral hypoglycemic drugs: Secondary | ICD-10-CM | POA: Insufficient documentation

## 2020-05-10 DIAGNOSIS — E119 Type 2 diabetes mellitus without complications: Secondary | ICD-10-CM | POA: Insufficient documentation

## 2020-05-10 DIAGNOSIS — F17210 Nicotine dependence, cigarettes, uncomplicated: Secondary | ICD-10-CM | POA: Insufficient documentation

## 2020-05-10 DIAGNOSIS — I10 Essential (primary) hypertension: Secondary | ICD-10-CM | POA: Insufficient documentation

## 2020-05-10 DIAGNOSIS — Z79899 Other long term (current) drug therapy: Secondary | ICD-10-CM | POA: Insufficient documentation

## 2020-05-10 LAB — CBC
HCT: 39.5 % (ref 39.0–52.0)
Hemoglobin: 12.3 g/dL — ABNORMAL LOW (ref 13.0–17.0)
MCH: 24.9 pg — ABNORMAL LOW (ref 26.0–34.0)
MCHC: 31.1 g/dL (ref 30.0–36.0)
MCV: 80 fL (ref 80.0–100.0)
Platelets: 164 10*3/uL (ref 150–400)
RBC: 4.94 MIL/uL (ref 4.22–5.81)
RDW: 12.9 % (ref 11.5–15.5)
WBC: 5.3 10*3/uL (ref 4.0–10.5)
nRBC: 0 % (ref 0.0–0.2)

## 2020-05-10 LAB — URINALYSIS, COMPLETE (UACMP) WITH MICROSCOPIC
Bacteria, UA: NONE SEEN
Bilirubin Urine: NEGATIVE
Glucose, UA: >=500 mg/dL — AB
Hgb urine dipstick: NEGATIVE
Ketones, ur: 20 mg/dL — AB
Leukocytes,Ua: NEGATIVE
Nitrite: NEGATIVE
Protein, ur: NEGATIVE mg/dL
Specific Gravity, Urine: 1.036 — ABNORMAL HIGH (ref 1.005–1.030)
Squamous Epithelial / HPF: NONE SEEN (ref 0–5)
pH: 6 (ref 5.0–8.0)

## 2020-05-10 LAB — BASIC METABOLIC PANEL
Anion gap: 9 (ref 5–15)
BUN: 10 mg/dL (ref 6–20)
CO2: 24 mmol/L (ref 22–32)
Calcium: 9 mg/dL (ref 8.9–10.3)
Chloride: 102 mmol/L (ref 98–111)
Creatinine, Ser: 0.72 mg/dL (ref 0.61–1.24)
GFR calc Af Amer: >60 mL/min (ref >60)
GFR calc non Af Amer: >60 mL/min (ref >60)
Glucose, Bld: 344 mg/dL — ABNORMAL HIGH (ref 70–99)
Potassium: 3.8 mmol/L (ref 3.5–5.1)
Sodium: 135 mmol/L (ref 135–145)

## 2020-05-10 LAB — GLUCOSE, CAPILLARY
Glucose-Capillary: 351 mg/dL — ABNORMAL HIGH (ref 70–99)
Glucose-Capillary: 244 mg/dL — ABNORMAL HIGH (ref 70–99)

## 2020-05-10 MED ORDER — SODIUM CHLORIDE 0.9 % IV SOLN
1000.0000 mL | Freq: Once | INTRAVENOUS | Status: AC
  Administered 2020-05-10: 1000 mL via INTRAVENOUS

## 2020-05-10 MED ORDER — GLIPIZIDE ER 2.5 MG PO TB24: 2.5000 mg | ORAL_TABLET | Freq: Every day | ORAL | 0 refills | Status: DC

## 2020-05-10 NOTE — ED Provider Notes (Signed)
[facility] Emergency Department Provider Note  ____________________________________________  Time seen: Approximately 12:07 PM  I have reviewed the triage vital signs and the nursing notes.   HISTORY  Chief Complaint No chief complaint on file.    HPI Roger Pennington is a 44 y.o. male that presents to the emergency department for evaluation of elevated blood sugar today.  Patient states that when he was at work, he began to feel dizzy and his blood sugar was 521 so he came to the emergency department.  Patient states that he was diagnosed with diabetes in March and has not been able to get his blood sugar under 300s since then.  He is taking Metformin 1000 mg twice per day.  His primary care placed him on Trajenta but states that medication is over $1000 and he cannot afford this.  No nausea, vomiting, abdominal pain.   Past Medical History:  Diagnosis Date  . Diabetes mellitus without complication ([place])   . Hypertension   . Tobacco abuse     Patient Active Problem List   Diagnosis Date Noted  . Severe recurrent major depression without psychotic features ([place]) 06/05/2019  . Suicide attempt ([place]) 06/05/2019  . Essential hypertension 06/05/2019  . Social anxiety disorder 06/05/2019  . Intentional acetaminophen overdose ([place]) 06/04/2019  . Depression with suicidal ideation 06/04/2019  . Severe recurrent major depression ([place]) 07/02/2017  . Tobacco use disorder 07/02/2017  . Cannabis use disorder, severe, dependence ([place]) 07/02/2017  . Upper GI bleed 02/08/2016    Past Surgical History:  Procedure Laterality Date  . ESOPHAGOGASTRODUODENOSCOPY (EGD) WITH PROPOFOL N/A 02/09/2016   Procedure: ESOPHAGOGASTRODUODENOSCOPY (EGD) WITH PROPOFOL;  Surgeon: Lollie Sails, MD;  Location: [facility] ENDOSCOPY;  Service: Endoscopy;  Laterality: N/A;    Prior to Admission medications   Medication Sig Start Date End Date Taking? Authorizing Provider  amLODipine  (NORVASC) 5 MG tablet Take 1 tablet (5 mg total) by mouth daily. 06/07/19   Clapacs, Madie Reno, MD  FLUoxetine (PROZAC) 20 MG capsule Take 1 capsule (20 mg total) by mouth daily. 06/07/19   Clapacs, Madie Reno, MD  glipiZIDE (GLUCOTROL XL) 2.5 MG 24 hr tablet Take 1 tablet (2.5 mg total) by mouth daily with breakfast. 05/10/20   Laban Emperor, PA-C  hydrochlorothiazide (HYDRODIURIL) 25 MG tablet Take 1 tablet (25 mg total) by mouth daily. 06/07/19   Clapacs, Madie Reno, MD  metFORMIN (GLUCOPHAGE) 500 MG tablet Take 1 tablet (500 mg total) by mouth 2 (two) times daily with a meal. 03/17/20   Delman Kitten, MD    Allergies Patient has no known allergies.  Family History  Problem Relation Age of Onset  . Hypertension Father   . Hypertension Brother     Social History Social History   Tobacco Use  . Smoking status: Current Every Day Smoker  . Smokeless tobacco: Never Used  Substance Use Topics  . Alcohol use: Yes    Alcohol/week: 0.0 standard drinks    Comment: occ  . Drug use: No     Review of Systems  Cardiovascular: No chest pain. Respiratory: No SOB. Gastrointestinal: No abdominal pain.  No nausea, no vomiting.  Musculoskeletal: Negative for musculoskeletal pain. Skin: Negative for rash, abrasions, lacerations, ecchymosis. Neurological: Negative for headaches   ____________________________________________   PHYSICAL EXAM:  VITAL SIGNS: ED Triage Vitals  Enc Vitals Group     BP 05/10/20 0910 (!) 156/88     Pulse Rate 05/10/20 0910 70     Resp 05/10/20  0910 16     Temp 05/10/20 0910 98.1 F (36.7 C)     Temp Source 05/10/20 0910 Oral     SpO2 05/10/20 0910 99 %     Weight 05/10/20 0912 248 lb 0.3 oz (112.5 kg)     Height 05/10/20 0912 6' (1.829 m)     Head Circumference --      Peak Flow --      Pain Score 05/10/20 0911 0     Pain Loc --      Pain Edu? --      Excl. in [place]? --      Constitutional: Alert and oriented. Well appearing and in no acute distress. Eyes:  Conjunctivae are normal. PERRL. EOMI. Head: Atraumatic. ENT:      Ears:      Nose: No congestion/rhinnorhea.      Mouth/Throat: Mucous membranes are moist.  Neck: No stridor.  Cardiovascular: Normal rate, regular rhythm.  Good peripheral circulation. Respiratory: Normal respiratory effort without tachypnea or retractions. Lungs CTAB. Good air entry to the bases with no decreased or absent breath sounds. Musculoskeletal: Full range of motion to all extremities. No gross deformities appreciated. Neurologic:  Normal speech and language. No gross focal neurologic deficits are appreciated.  Skin:  Skin is warm, dry and intact. No rash noted. Psychiatric: Mood and affect are normal. Speech and behavior are normal. Patient exhibits appropriate insight and judgement.   ____________________________________________   LABS (all labs ordered are listed, but only abnormal results are displayed)  Labs Reviewed  GLUCOSE, CAPILLARY - Abnormal; Notable for the following components:      Result Value   Glucose-Capillary 351 (*)    All other components within normal limits  BASIC METABOLIC PANEL - Abnormal; Notable for the following components:   Glucose, Bld 344 (*)    All other components within normal limits  CBC - Abnormal; Notable for the following components:   Hemoglobin 12.3 (*)    MCH 24.9 (*)    All other components within normal limits  URINALYSIS, COMPLETE (UACMP) WITH MICROSCOPIC - Abnormal; Notable for the following components:   Color, Urine YELLOW (*)    APPearance CLEAR (*)    Specific Gravity, Urine 1.036 (*)    Glucose, UA >=500 (*)    Ketones, ur 20 (*)    All other components within normal limits  GLUCOSE, CAPILLARY - Abnormal; Notable for the following components:   Glucose-Capillary 244 (*)    All other components within normal limits  CBG MONITORING, ED  CBG MONITORING, ED  CBG MONITORING, ED    ____________________________________________  EKG   ____________________________________________  RADIOLOGY   No results found.  ____________________________________________    PROCEDURES  Procedure(s) performed:    Procedures    Medications  0.9 %  sodium chloride infusion (0 mLs Intravenous Stopped 05/10/20 1406)     ____________________________________________   INITIAL IMPRESSION / ASSESSMENT AND PLAN / ED COURSE  Pertinent labs & imaging results that were available during my care of the patient were reviewed by me and considered in my medical decision making (see chart for details).  Review of the [place] CSRS was performed in accordance of the [facility] prior to dispensing any controlled drugs.   Patient presented to the emergency department for evaluation of elevated blood sugar.  Vital signs and exam are reassuring.  On arrival to the emergency department, patient's blood sugar was 351.  Patient was given a liter of fluids.  Patient's blood sugar was 244  after 1 liter of fluids.  Patient's primary care recently placed him on Tradjenta, which he is unable to afford.  I will give him a short and low dose of glipizide, which is much cheaper and patient is able to afford this.  Patient was just educated on the risks of hypoglycemia and patient will keep for regular checks on his blood sugar.  Patient has a follow-up with primary care next Thursday and will discuss medication.  Patient will be discharged home with prescriptions for glipizide. Patient is to follow up with primary care as directed. Patient is given ED precautions to return to the ED for any worsening or new symptoms.  [name] was evaluated in Emergency Department on 05/10/2020 for the symptoms described in the history of present illness. He was evaluated in the context of the global COVID-19 pandemic, which necessitated consideration that the patient might be at risk for infection with the SARS-CoV-2  virus that causes COVID-19. Institutional protocols and algorithms that pertain to the evaluation of patients at risk for COVID-19 are in a state of rapid change based on information released by regulatory bodies including the CDC and federal and state organizations. These policies and algorithms were followed during the patient's care in the ED.   ____________________________________________  FINAL CLINICAL IMPRESSION(S) / ED DIAGNOSES  Final diagnoses:  Type 2 diabetes mellitus without complication, without long-term current use of insulin (HCC)      NEW MEDICATIONS STARTED DURING THIS VISIT:  ED Discharge Orders         Ordered    glipiZIDE (GLUCOTROL XL) 2.5 MG 24 hr tablet  Daily with breakfast     05/10/20 1338              This chart was dictated using voice recognition software/Dragon. Despite best efforts to proofread, errors can occur which can change the meaning. Any change was purely unintentional.    Enid Derry, PA-C 05/10/20 1503    Sharyn Creamer, MD 05/10/20 [phone]

## 2020-05-10 NOTE — Discharge Instructions (Signed)
Please watch your blood sugar several times per day.  Keep your appointment with primary care next Thursday.

## 2020-05-10 NOTE — ED Triage Notes (Addendum)
Arrives today c/o elevated blood sugar.  States CBG 521 at work.  Takes metformin 1000 mg BID.   States CBG has not been under 360 since diagnosed with DM in March.   AAOx3.  NAD.

## 2020-05-24 ENCOUNTER — Inpatient Hospital Stay
Admission: EM | Admit: 2020-05-24 | Discharge: 2020-05-28 | DRG: 853 | Disposition: A | Discharge status: Home / Self Care | Payer: Self-pay | Attending: Internal Medicine | Admitting: Internal Medicine

## 2020-05-24 ENCOUNTER — Emergency Department: Payer: Self-pay

## 2020-05-24 ENCOUNTER — Other Ambulatory Visit: Payer: Self-pay

## 2020-05-24 ENCOUNTER — Encounter: Payer: Self-pay | Admitting: Emergency Medicine

## 2020-05-24 DIAGNOSIS — R1030 Lower abdominal pain, unspecified: Secondary | ICD-10-CM | POA: Diagnosis present

## 2020-05-24 DIAGNOSIS — F332 Major depressive disorder, recurrent severe without psychotic features: Secondary | ICD-10-CM | POA: Diagnosis present

## 2020-05-24 DIAGNOSIS — L02215 Cutaneous abscess of perineum: Secondary | ICD-10-CM | POA: Diagnosis present

## 2020-05-24 DIAGNOSIS — R0789 Other chest pain: Secondary | ICD-10-CM | POA: Diagnosis present

## 2020-05-24 DIAGNOSIS — Z8249 Family history of ischemic heart disease and other diseases of the circulatory system: Secondary | ICD-10-CM

## 2020-05-24 DIAGNOSIS — R079 Chest pain, unspecified: Secondary | ICD-10-CM

## 2020-05-24 DIAGNOSIS — Z79899 Other long term (current) drug therapy: Secondary | ICD-10-CM

## 2020-05-24 DIAGNOSIS — Z7984 Long term (current) use of oral hypoglycemic drugs: Secondary | ICD-10-CM

## 2020-05-24 DIAGNOSIS — K651 Peritoneal abscess: Secondary | ICD-10-CM | POA: Diagnosis present

## 2020-05-24 DIAGNOSIS — Z720 Tobacco use: Secondary | ICD-10-CM | POA: Diagnosis present

## 2020-05-24 DIAGNOSIS — L03315 Cellulitis of perineum: Secondary | ICD-10-CM | POA: Diagnosis present

## 2020-05-24 DIAGNOSIS — E119 Type 2 diabetes mellitus without complications: Secondary | ICD-10-CM

## 2020-05-24 DIAGNOSIS — Z20822 Contact with and (suspected) exposure to covid-19: Secondary | ICD-10-CM | POA: Diagnosis present

## 2020-05-24 DIAGNOSIS — L0291 Cutaneous abscess, unspecified: Secondary | ICD-10-CM

## 2020-05-24 DIAGNOSIS — I4510 Unspecified right bundle-branch block: Secondary | ICD-10-CM | POA: Diagnosis present

## 2020-05-24 DIAGNOSIS — A419 Sepsis, unspecified organism: Principal | ICD-10-CM | POA: Diagnosis present

## 2020-05-24 DIAGNOSIS — E876 Hypokalemia: Secondary | ICD-10-CM | POA: Diagnosis present

## 2020-05-24 DIAGNOSIS — E1165 Type 2 diabetes mellitus with hyperglycemia: Secondary | ICD-10-CM | POA: Diagnosis present

## 2020-05-24 DIAGNOSIS — B9689 Other specified bacterial agents as the cause of diseases classified elsewhere: Secondary | ICD-10-CM | POA: Diagnosis present

## 2020-05-24 DIAGNOSIS — I10 Essential (primary) hypertension: Secondary | ICD-10-CM | POA: Diagnosis present

## 2020-05-24 LAB — CBC WITH DIFFERENTIAL/PLATELET
Abs Immature Granulocytes: 0.08 10*3/uL — ABNORMAL HIGH (ref 0.00–0.07)
Basophils Absolute: 0 10*3/uL (ref 0.0–0.1)
Basophils Relative: 0 %
Eosinophils Absolute: 0 10*3/uL (ref 0.0–0.5)
Eosinophils Relative: 0 %
HCT: 38.1 % — ABNORMAL LOW (ref 39.0–52.0)
Hemoglobin: 11.9 g/dL — ABNORMAL LOW (ref 13.0–17.0)
Immature Granulocytes: 1 %
Lymphocytes Relative: 10 %
Lymphs Abs: 1.2 10*3/uL (ref 0.7–4.0)
MCH: 24.8 pg — ABNORMAL LOW (ref 26.0–34.0)
MCHC: 31.2 g/dL (ref 30.0–36.0)
MCV: 79.5 fL — ABNORMAL LOW (ref 80.0–100.0)
Monocytes Absolute: 1.2 10*3/uL — ABNORMAL HIGH (ref 0.1–1.0)
Monocytes Relative: 10 %
Neutro Abs: 9.2 10*3/uL — ABNORMAL HIGH (ref 1.7–7.7)
Neutrophils Relative %: 79 %
Platelets: 196 10*3/uL (ref 150–400)
RBC: 4.79 MIL/uL (ref 4.22–5.81)
RDW: 12.3 % (ref 11.5–15.5)
WBC: 11.7 10*3/uL — ABNORMAL HIGH (ref 4.0–10.5)
nRBC: 0 % (ref 0.0–0.2)

## 2020-05-24 LAB — COMPREHENSIVE METABOLIC PANEL
ALT: 18 U/L (ref 0–44)
AST: 14 U/L — ABNORMAL LOW (ref 15–41)
Albumin: 3.6 g/dL (ref 3.5–5.0)
Alkaline Phosphatase: 77 U/L (ref 38–126)
Anion gap: 14 (ref 5–15)
BUN: 16 mg/dL (ref 6–20)
CO2: 22 mmol/L (ref 22–32)
Calcium: 8.8 mg/dL — ABNORMAL LOW (ref 8.9–10.3)
Chloride: 99 mmol/L (ref 98–111)
Creatinine, Ser: 1.12 mg/dL (ref 0.61–1.24)
GFR calc Af Amer: >60 mL/min (ref >60)
GFR calc non Af Amer: >60 mL/min (ref >60)
Glucose, Bld: 300 mg/dL — ABNORMAL HIGH (ref 70–99)
Potassium: 4.1 mmol/L (ref 3.5–5.1)
Sodium: 135 mmol/L (ref 135–145)
Total Bilirubin: 2.8 mg/dL — ABNORMAL HIGH (ref 0.3–1.2)
Total Protein: 7.4 g/dL (ref 6.5–8.1)

## 2020-05-24 LAB — LIPASE, BLOOD: Lipase: 20 U/L (ref 11–51)

## 2020-05-24 LAB — TROPONIN I (HIGH SENSITIVITY)
Troponin I (High Sensitivity): 3 ng/L (ref <18)
Troponin I (High Sensitivity): 3 ng/L (ref <18)
Troponin I (High Sensitivity): 4 ng/L (ref <18)

## 2020-05-24 LAB — LACTIC ACID, PLASMA
Lactic Acid, Venous: 2 mmol/L (ref 0.5–1.9)
Lactic Acid, Venous: 1.2 mmol/L (ref 0.5–1.9)

## 2020-05-24 LAB — GLUCOSE, CAPILLARY
Glucose-Capillary: 297 mg/dL — ABNORMAL HIGH (ref 70–99)
Glucose-Capillary: 233 mg/dL — ABNORMAL HIGH (ref 70–99)
Glucose-Capillary: 302 mg/dL — ABNORMAL HIGH (ref 70–99)

## 2020-05-24 LAB — SARS CORONAVIRUS 2 BY RT PCR (HOSPITAL ORDER, PERFORMED IN ~~LOC~~ HOSPITAL LAB): SARS Coronavirus 2: NEGATIVE

## 2020-05-24 LAB — HEMOGLOBIN A1C
Hgb A1c MFr Bld: 10.4 % — ABNORMAL HIGH (ref 4.8–5.6)
Mean Plasma Glucose: 251.78 mg/dL

## 2020-05-24 IMAGING — DX DG CHEST 1V
1 series · 1 of 1 positions shown · non-contrast
Comparison: [DATE]

CLINICAL DATA: Chest pain

EXAM:
CHEST  1 VIEW

[chest ap]
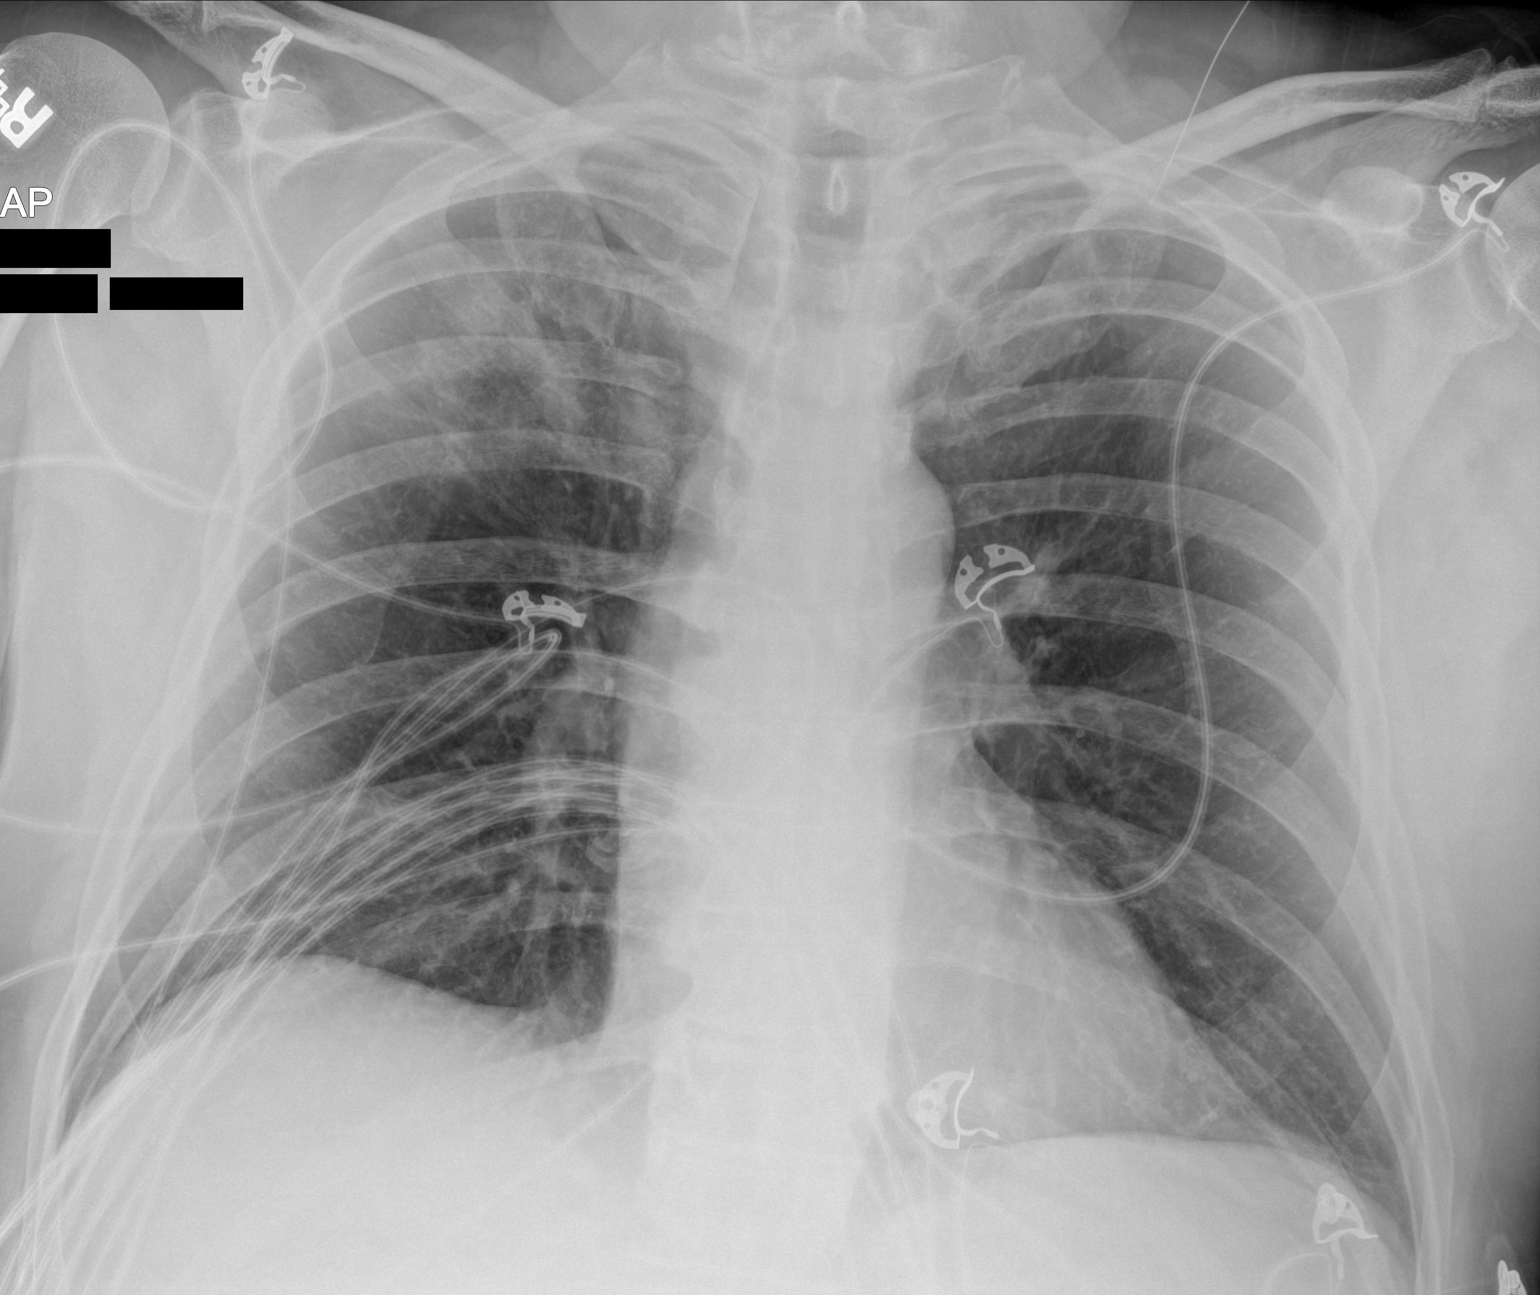

[1 of 1 positions shown; findings below may reference images not displayed]

FINDINGS: Airspace disease in the right upper lobe concerning for pneumonia.
No focal opacity on the left. Heart is normal size. No effusions. No
acute bony abnormality.
IMPRESSION: Right upper lobe airspace opacity concerning for pneumonia. Followup
PA and lateral chest X-ray is recommended in 3-4 weeks following
trial of antibiotic therapy to ensure resolution and exclude
underlying malignancy.

## 2020-05-24 IMAGING — CT CT ABD-PELV W/ CM
3 of 6 series · 14 of 36 positions shown, 16 images · IV contrast (omnipaque)
Comparison: Current chest radiograph.  CT, [DATE].

CLINICAL DATA: Presents with body aches States he developed right
groin pain,chest pain and h/a on [REDACTED] also has had some n/v States
he is able to keep fluids down but not able to eat States last time
vomited was this am Low grade fever. Abnormal CXR

EXAM:
CT CHEST, ABDOMEN, AND PELVIS WITH CONTRAST
TECHNIQUE: Multidetector CT imaging of the chest, abdomen and pelvis was
performed following the standard protocol during bolus
administration of intravenous contrast.
CONTRAST:  100mL OMNIPAQUE IOHEXOL 300 MG/ML  SOLN

[Series 504: cap with · axial · 0.78mm/px · z∈[-691,-146]mm · 8 of 141 slices shown, 10 images]
[im 16/141  mediastinal]
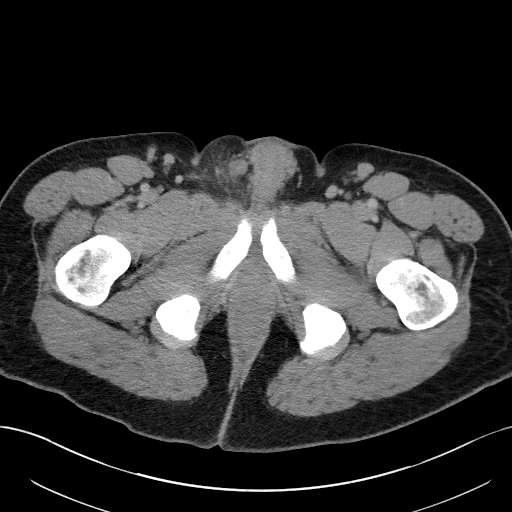
[im 16/141  lung]
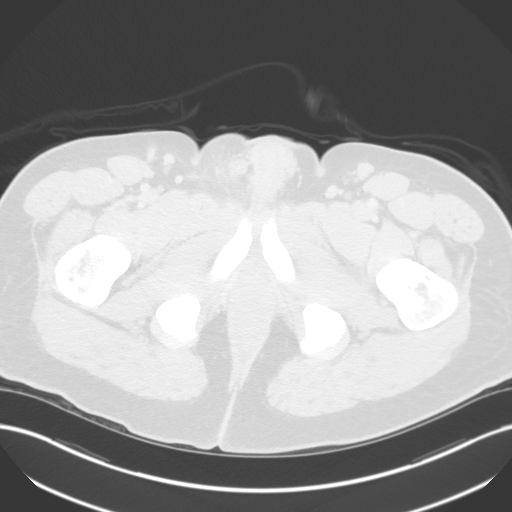
[im 32/141  lung]
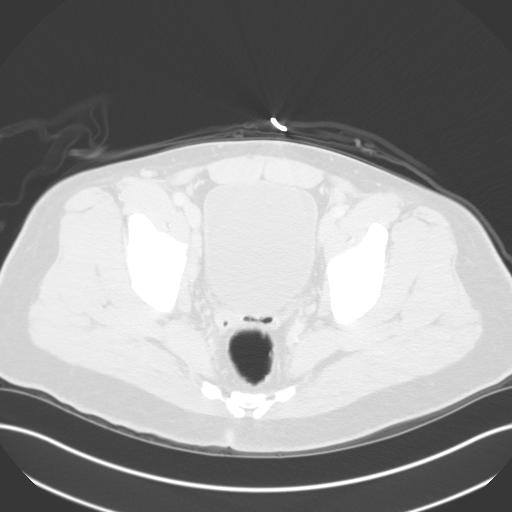
[im 47/141  lung]
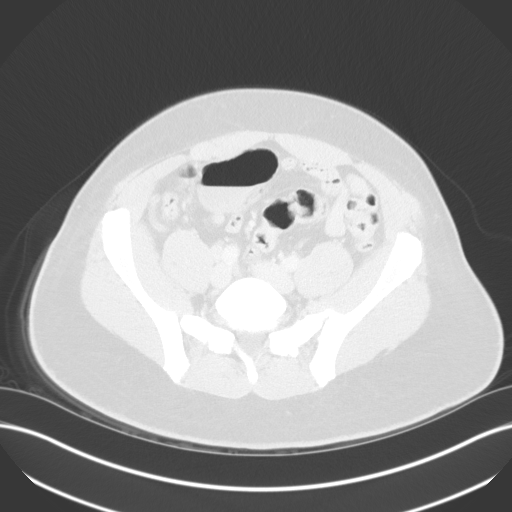
[im 63/141  lung]
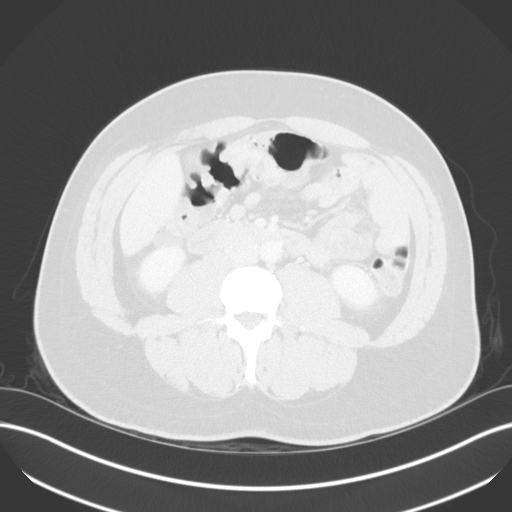
[im 78/141  mediastinal]
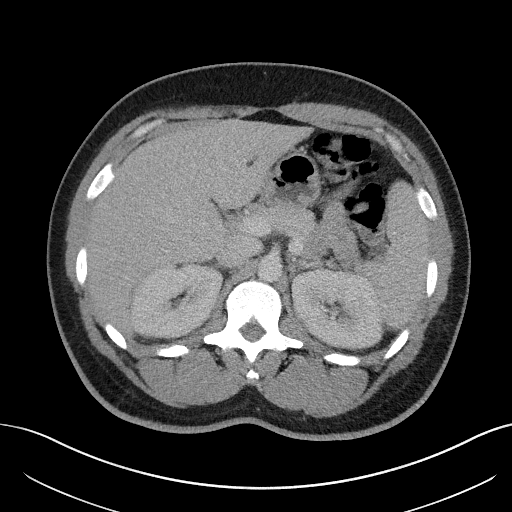
[im 78/141  lung]
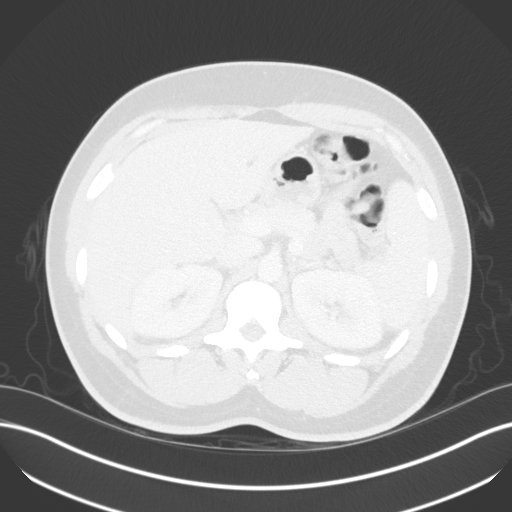
[im 94/141  lung]
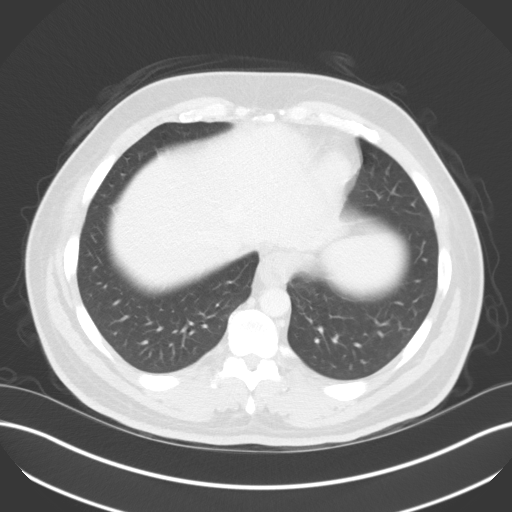
[im 109/141  lung]
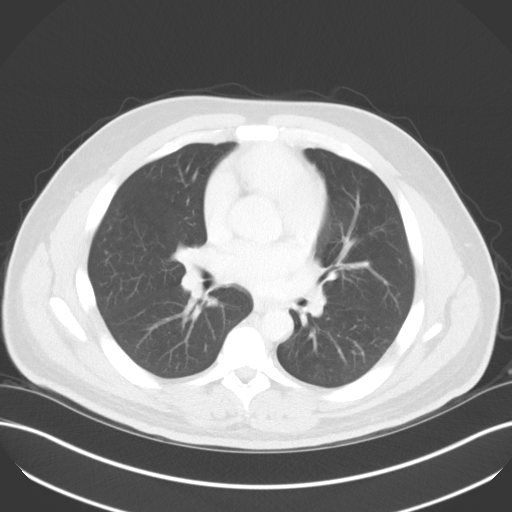
[im 125/141  lung]
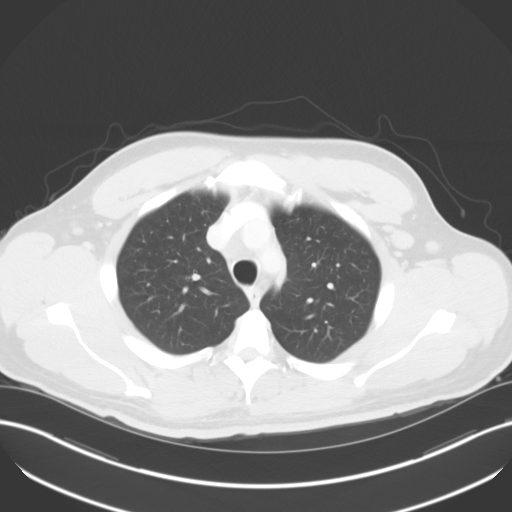

[Series 506: lung · axial · 0.78mm/px · z∈[-374,-290]mm · 3 of 169 slices shown]
[im 15/169  lung]
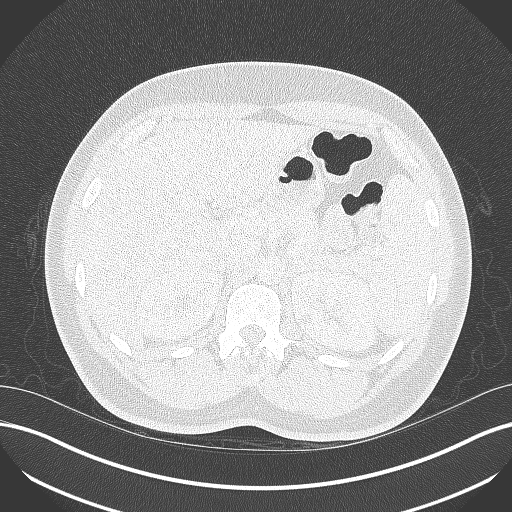
[im 43/169  lung]
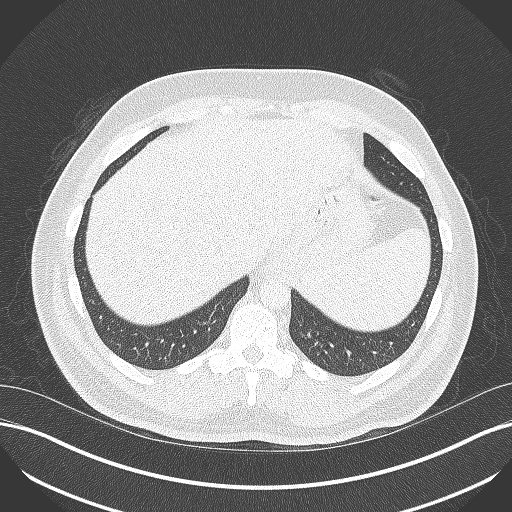
[im 57/169  lung]
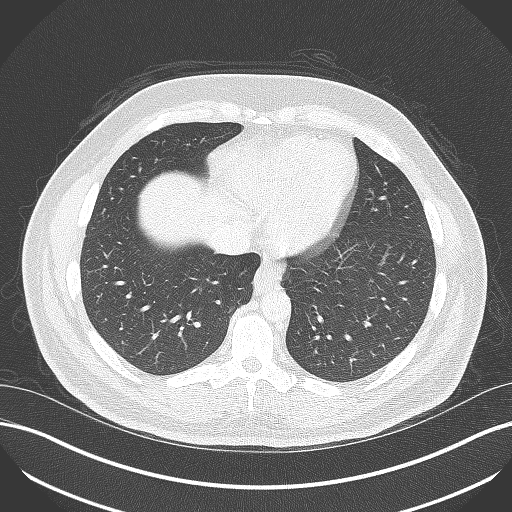

[Series 508: coronals · coronal · 0.90mm/px · 3 of 153 slices shown]
[im 31/153  lung]
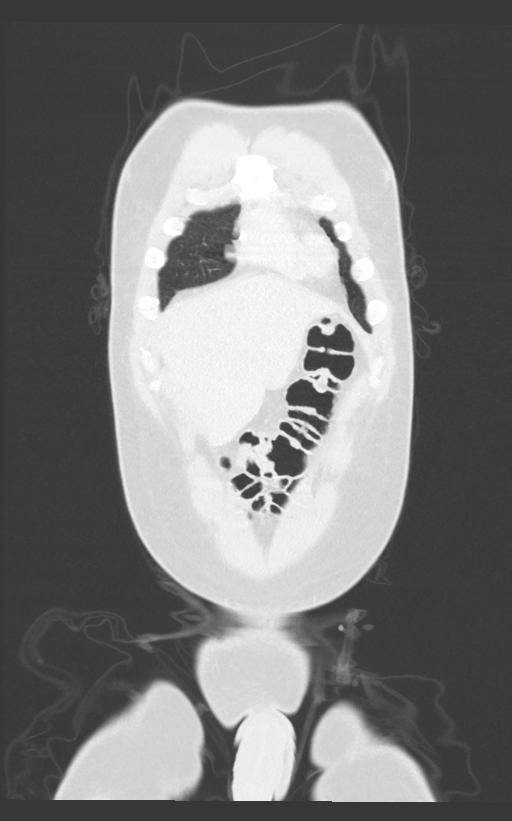
[im 61/153  lung]
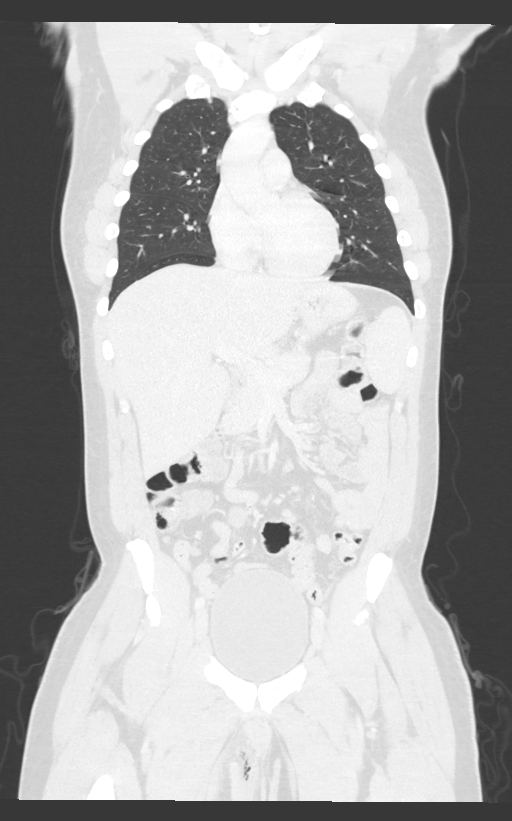
[im 92/153  lung]
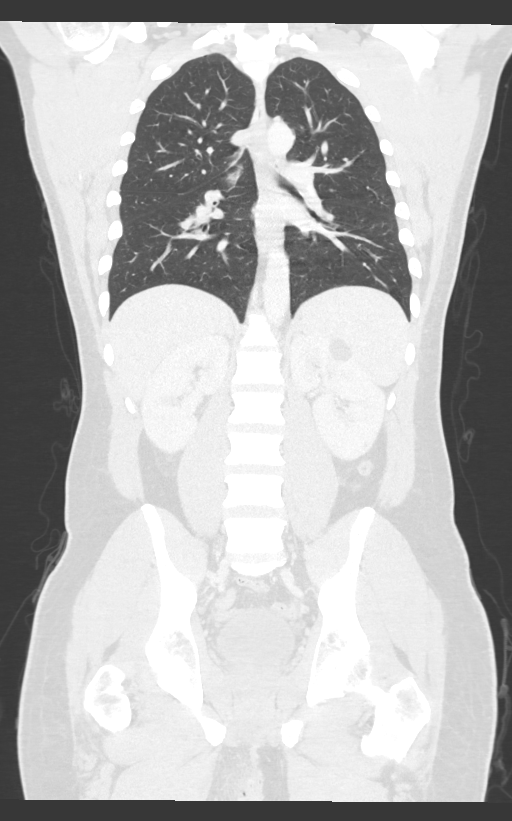

[14 of 36 positions shown; findings below may reference images not displayed]

FINDINGS: CT CHEST FINDINGS

Cardiovascular: Heart is normal in size and configuration. No
pericardial effusion. No coronary artery calcifications. Normal
great vessels.

Mediastinum/Nodes: Prominent to mildly enlarged bilateral axillary
lymph nodes, largest on the side, 12 mm in short axis. No
mediastinal or hilar masses. No enlarged mediastinal or hilar lymph
nodes. Normal trachea and esophagus.

Lungs/Pleura: Lungs are clear. No pleural effusion or pneumothorax.

Musculoskeletal: No fracture or acute finding. No bone lesion. Mild
disc degenerative changes along the thoracic spine. No chest wall
mass.

CT ABDOMEN PELVIS FINDINGS

Hepatobiliary: No focal liver abnormality is seen. No gallstones,
gallbladder wall thickening, or biliary dilatation.

Pancreas: Unremarkable. No pancreatic ductal dilatation or
surrounding inflammatory changes.

Spleen: Normal in size without focal abnormality.

Adrenals/Urinary Tract: No adrenal masses. Kidneys normal in size,
orientation and position with symmetric enhancement and excretion.
2.2 cm cyst, upper pole of the left kidney. Subcentimeter
low-attenuation mass in the medial upper pole the left kidney and
lower pole the right kidney, both also consistent with cysts, and
stable from the prior CT. No other masses, no stones and no
hydronephrosis. Normal ureters. Normal bladder.

Stomach/Bowel: Stomach is within normal limits. Appendix appears
normal. No evidence of bowel wall thickening, distention, or
inflammatory changes.

Vascular/Lymphatic: No vascular abnormality. There are prominent
inguinal lymph nodes, largest on the right measuring 11 mm in short
axis.

Reproductive: Inflammation and subcutaneous air extends from the
soft tissues just superior to the right scrotal along right inferior
perineum. No defined fluid collection to suggest an abscess.

Other: No ascites or abdominal wall hernia.

Musculoskeletal: No fracture or acute finding. No bone lesion. Mild
disc degenerative changes at L5-S1
IMPRESSION: 1. Clear lungs. No pneumonia or other infiltrate as suggested on the
current chest radiograph.
2. Prominent to mildly enlarged axillary and inguinal lymph nodes,
which are nonspecific and may all be reactive. A lymphoproliferative
disorder is possible
3. Inflammatory changes and subcutaneous air along the right
perineum extending from the soft tissues just above the right
scrotum. No evidence of an abscess.
4. No other acute abnormality within the chest, abdomen or pelvis.

## 2020-05-24 IMAGING — CT CT CHEST W/ CM
3 of 6 series · 14 of 36 positions shown, 16 images · IV contrast (omnipaque)
Comparison: Current chest radiograph.  CT, [DATE].

CLINICAL DATA: Presents with body aches States he developed right
groin pain,chest pain and h/a on [REDACTED] also has had some n/v States
he is able to keep fluids down but not able to eat States last time
vomited was this am Low grade fever. Abnormal CXR

EXAM:
CT CHEST, ABDOMEN, AND PELVIS WITH CONTRAST
TECHNIQUE: Multidetector CT imaging of the chest, abdomen and pelvis was
performed following the standard protocol during bolus
administration of intravenous contrast.
CONTRAST:  100mL OMNIPAQUE IOHEXOL 300 MG/ML  SOLN

[Series 2: cap with · axial · 0.78mm/px · z∈[-691,-146]mm · 8 of 141 slices shown, 10 images]
[im 16/141  mediastinal]
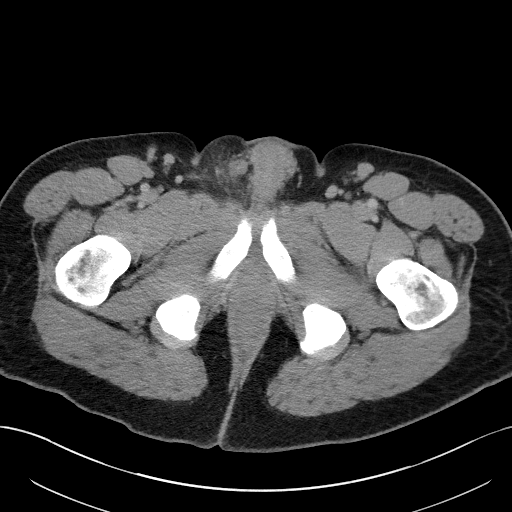
[im 16/141  lung]
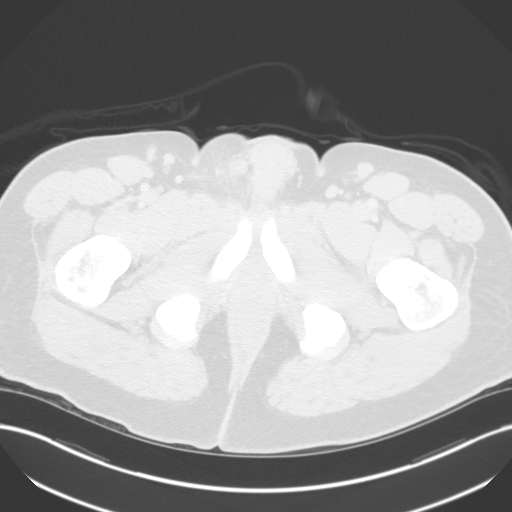
[im 32/141  lung]
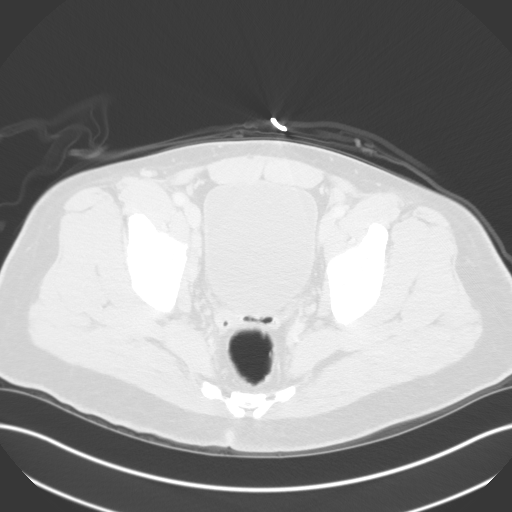
[im 47/141  lung]
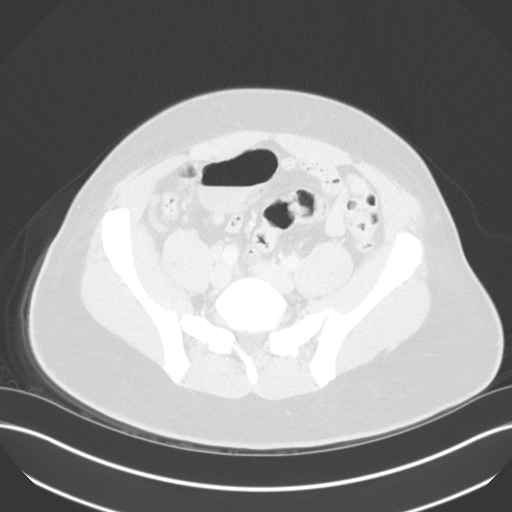
[im 63/141  lung]
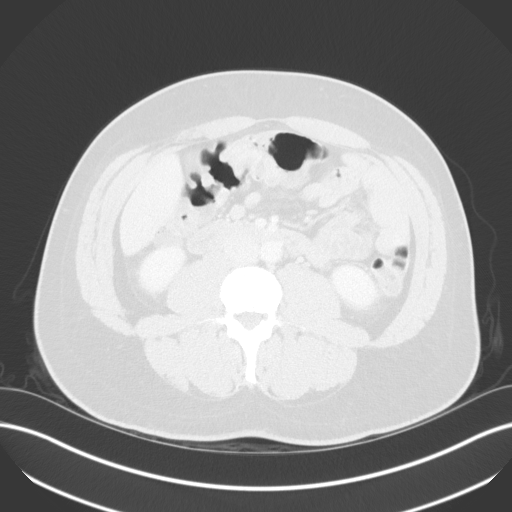
[im 78/141  mediastinal]
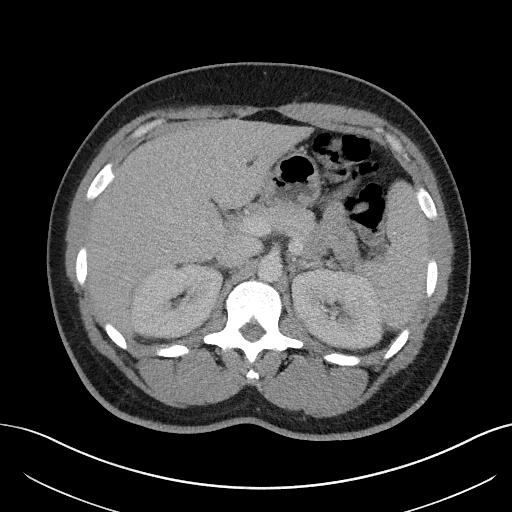
[im 78/141  lung]
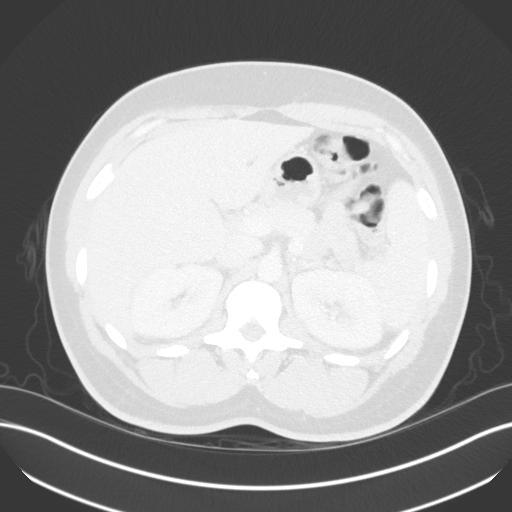
[im 94/141  lung]
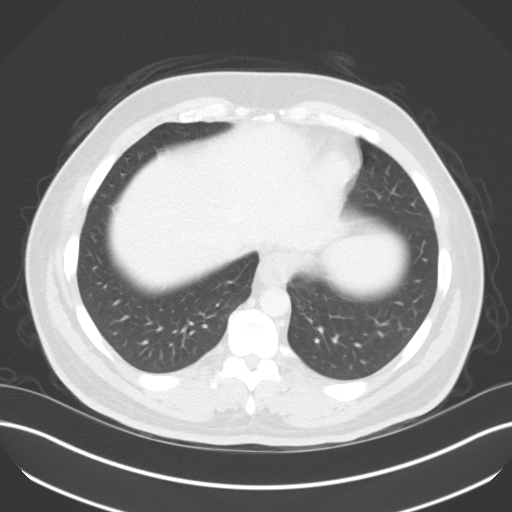
[im 109/141  lung]
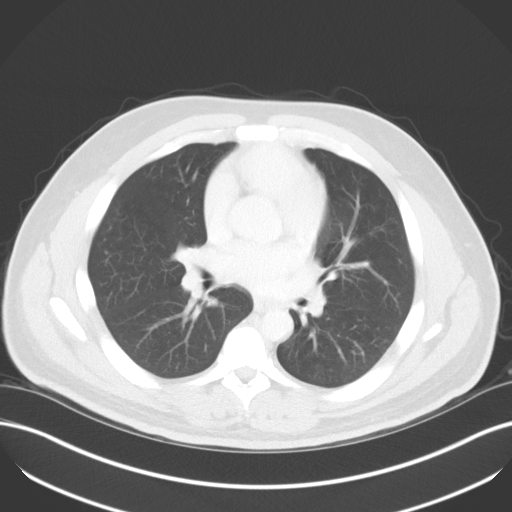
[im 125/141  lung]
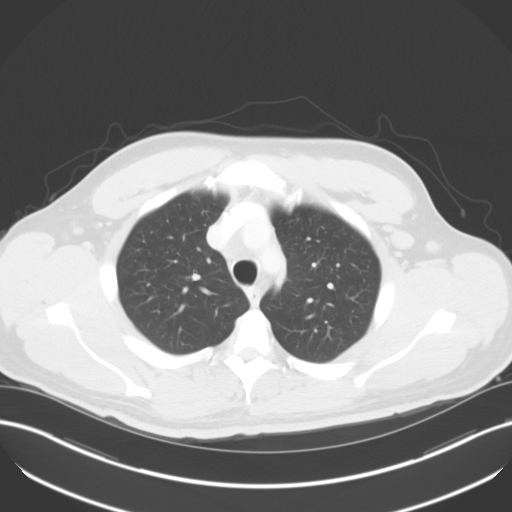

[Series 6: lung · axial · 0.78mm/px · z∈[-374,-290]mm · 3 of 169 slices shown]
[im 15/169  lung]
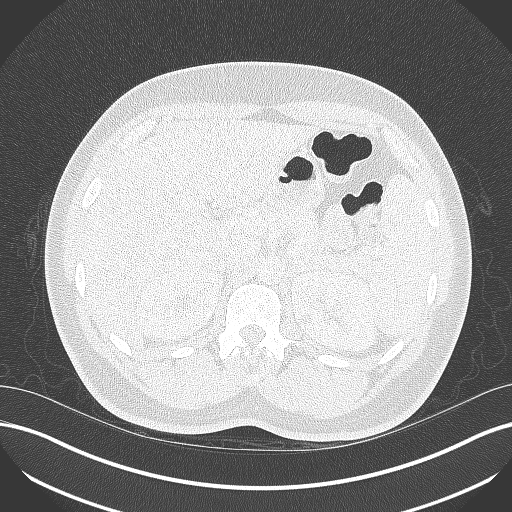
[im 43/169  lung]
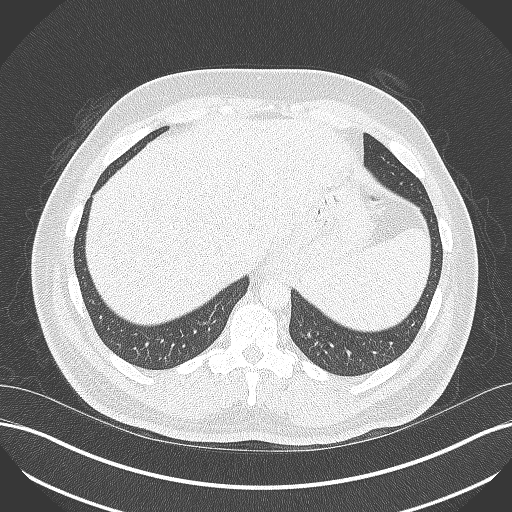
[im 57/169  lung]
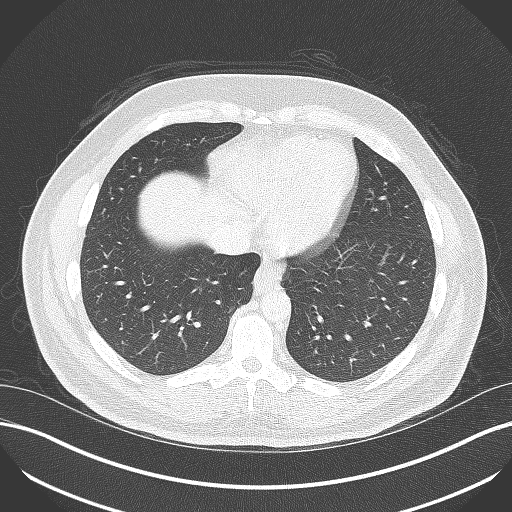

[Series 8: coronals · coronal · 0.90mm/px · 3 of 153 slices shown]
[im 31/153  lung]
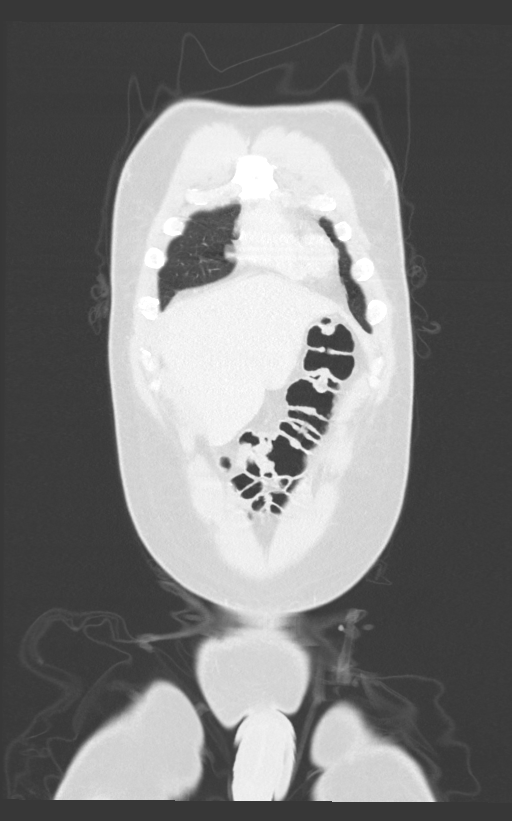
[im 61/153  lung]
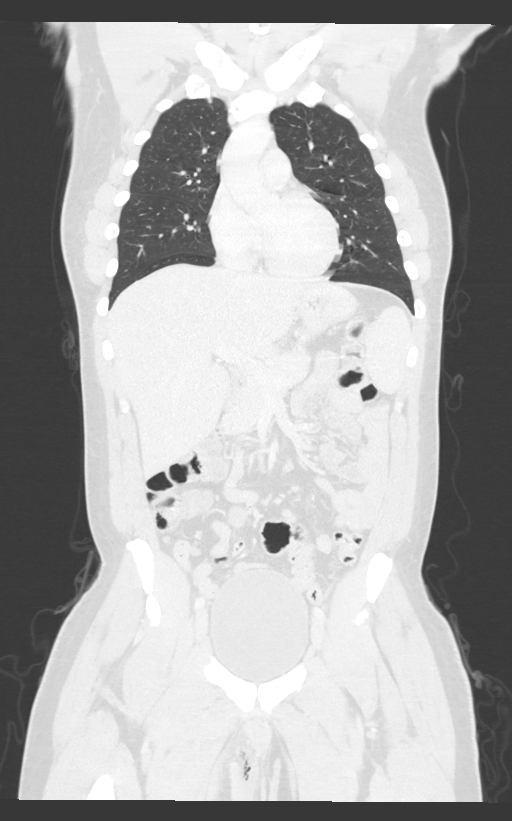
[im 92/153  lung]
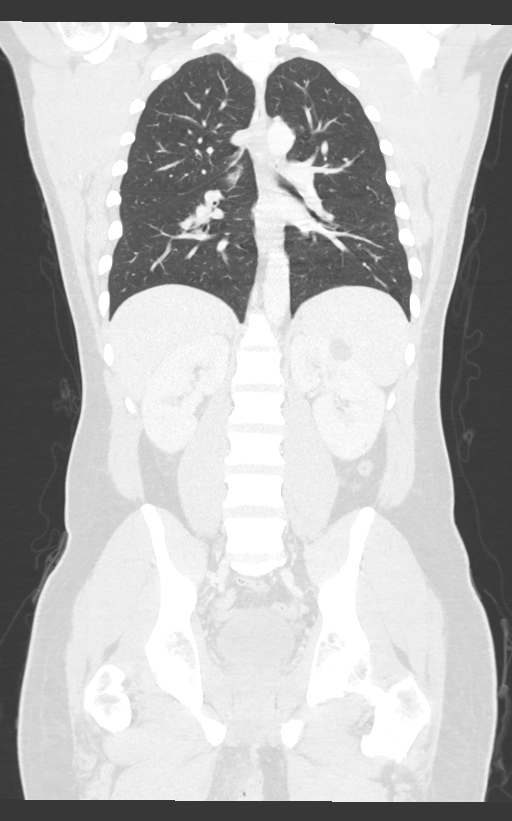

[14 of 36 positions shown; findings below may reference images not displayed]

FINDINGS: CT CHEST FINDINGS

Cardiovascular: Heart is normal in size and configuration. No
pericardial effusion. No coronary artery calcifications. Normal
great vessels.

Mediastinum/Nodes: Prominent to mildly enlarged bilateral axillary
lymph nodes, largest on the side, 12 mm in short axis. No
mediastinal or hilar masses. No enlarged mediastinal or hilar lymph
nodes. Normal trachea and esophagus.

Lungs/Pleura: Lungs are clear. No pleural effusion or pneumothorax.

Musculoskeletal: No fracture or acute finding. No bone lesion. Mild
disc degenerative changes along the thoracic spine. No chest wall
mass.

CT ABDOMEN PELVIS FINDINGS

Hepatobiliary: No focal liver abnormality is seen. No gallstones,
gallbladder wall thickening, or biliary dilatation.

Pancreas: Unremarkable. No pancreatic ductal dilatation or
surrounding inflammatory changes.

Spleen: Normal in size without focal abnormality.

Adrenals/Urinary Tract: No adrenal masses. Kidneys normal in size,
orientation and position with symmetric enhancement and excretion.
2.2 cm cyst, upper pole of the left kidney. Subcentimeter
low-attenuation mass in the medial upper pole the left kidney and
lower pole the right kidney, both also consistent with cysts, and
stable from the prior CT. No other masses, no stones and no
hydronephrosis. Normal ureters. Normal bladder.

Stomach/Bowel: Stomach is within normal limits. Appendix appears
normal. No evidence of bowel wall thickening, distention, or
inflammatory changes.

Vascular/Lymphatic: No vascular abnormality. There are prominent
inguinal lymph nodes, largest on the right measuring 11 mm in short
axis.

Reproductive: Inflammation and subcutaneous air extends from the
soft tissues just superior to the right scrotal along right inferior
perineum. No defined fluid collection to suggest an abscess.

Other: No ascites or abdominal wall hernia.

Musculoskeletal: No fracture or acute finding. No bone lesion. Mild
disc degenerative changes at L5-S1
IMPRESSION: 1. Clear lungs. No pneumonia or other infiltrate as suggested on the
current chest radiograph.
2. Prominent to mildly enlarged axillary and inguinal lymph nodes,
which are nonspecific and may all be reactive. A lymphoproliferative
disorder is possible
3. Inflammatory changes and subcutaneous air along the right
perineum extending from the soft tissues just above the right
scrotum. No evidence of an abscess.
4. No other acute abnormality within the chest, abdomen or pelvis.

## 2020-05-24 MED ORDER — IOHEXOL 300 MG/ML  SOLN
100.0000 mL | Freq: Once | INTRAMUSCULAR | Status: AC | PRN
  Administered 2020-05-24: 100 mL via INTRAVENOUS
  Filled 2020-05-24: qty 100

## 2020-05-24 MED ORDER — SODIUM CHLORIDE 0.9 % IV SOLN
1.0000 g | Freq: Once | INTRAVENOUS | Status: AC
  Administered 2020-05-24: 1 g via INTRAVENOUS
  Filled 2020-05-24: qty 10

## 2020-05-24 MED ORDER — SODIUM CHLORIDE 0.9 % IV BOLUS
1000.0000 mL | Freq: Once | INTRAVENOUS | Status: AC
  Administered 2020-05-24: 1000 mL via INTRAVENOUS

## 2020-05-24 MED ORDER — ONDANSETRON HCL 4 MG/2ML IJ SOLN
4.0000 mg | Freq: Once | INTRAMUSCULAR | Status: AC
  Administered 2020-05-24: 4 mg via INTRAVENOUS
  Filled 2020-05-24: qty 2

## 2020-05-24 MED ORDER — SODIUM CHLORIDE 0.9 % IV BOLUS
500.0000 mL | Freq: Once | INTRAVENOUS | Status: AC
  Administered 2020-05-24: 500 mL via INTRAVENOUS

## 2020-05-24 MED ORDER — ACETAMINOPHEN 325 MG PO TABS
650.0000 mg | ORAL_TABLET | Freq: Once | ORAL | Status: AC
  Administered 2020-05-24: 650 mg via ORAL
  Filled 2020-05-24: qty 2

## 2020-05-24 MED ORDER — ALBUTEROL SULFATE (2.5 MG/3ML) 0.083% IN NEBU: 3.0000 mL | INHALATION_SOLUTION | RESPIRATORY_TRACT | Status: DC | PRN

## 2020-05-24 MED ORDER — INSULIN GLARGINE 100 UNIT/ML ~~LOC~~ SOLN
20.0000 [IU] | Freq: Every day | SUBCUTANEOUS | Status: DC
  Administered 2020-05-25 – 2020-05-26 (×2): 20 [IU] via SUBCUTANEOUS
  Filled 2020-05-24 (×2): qty 0.2

## 2020-05-24 MED ORDER — INSULIN ASPART 100 UNIT/ML ~~LOC~~ SOLN
0.0000 [IU] | SUBCUTANEOUS | Status: DC
  Administered 2020-05-24: 3 [IU] via SUBCUTANEOUS
  Administered 2020-05-24: 7 [IU] via SUBCUTANEOUS
  Administered 2020-05-25: 2 [IU] via SUBCUTANEOUS
  Administered 2020-05-25: 3 [IU] via SUBCUTANEOUS
  Administered 2020-05-25: 5 [IU] via SUBCUTANEOUS
  Administered 2020-05-25 – 2020-05-26 (×3): 3 [IU] via SUBCUTANEOUS
  Administered 2020-05-26 (×3): 2 [IU] via SUBCUTANEOUS
  Administered 2020-05-26: 1 [IU] via SUBCUTANEOUS
  Administered 2020-05-26: 2 [IU] via SUBCUTANEOUS
  Administered 2020-05-27: 1 [IU] via SUBCUTANEOUS
  Administered 2020-05-27: 3 [IU] via SUBCUTANEOUS
  Administered 2020-05-27: 5 [IU] via SUBCUTANEOUS
  Administered 2020-05-27 – 2020-05-28 (×2): 1 [IU] via SUBCUTANEOUS
  Administered 2020-05-28 (×2): 3 [IU] via SUBCUTANEOUS
  Administered 2020-05-28: 2 [IU] via SUBCUTANEOUS
  Filled 2020-05-24 (×21): qty 1

## 2020-05-24 MED ORDER — ASPIRIN EC 81 MG PO TBEC
81.0000 mg | DELAYED_RELEASE_TABLET | Freq: Every day | ORAL | Status: DC
  Administered 2020-05-25 – 2020-05-28 (×4): 81 mg via ORAL
  Filled 2020-05-24 (×4): qty 1

## 2020-05-24 MED ORDER — DM-GUAIFENESIN ER 30-600 MG PO TB12: 1.0000 | ORAL_TABLET | Freq: Two times a day (BID) | ORAL | Status: DC | PRN

## 2020-05-24 MED ORDER — ENOXAPARIN SODIUM 40 MG/0.4ML ~~LOC~~ SOLN
40.0000 mg | SUBCUTANEOUS | Status: DC
  Administered 2020-05-24 – 2020-05-27 (×4): 40 mg via SUBCUTANEOUS
  Filled 2020-05-24 (×4): qty 0.4

## 2020-05-24 MED ORDER — SODIUM CHLORIDE 0.9 % IV SOLN: INTRAVENOUS | Status: DC

## 2020-05-24 MED ORDER — ACETAMINOPHEN 325 MG PO TABS
650.0000 mg | ORAL_TABLET | Freq: Four times a day (QID) | ORAL | Status: DC | PRN
  Administered 2020-05-24 – 2020-05-26 (×2): 650 mg via ORAL
  Filled 2020-05-24 (×2): qty 2

## 2020-05-24 MED ORDER — HYDRALAZINE HCL 20 MG/ML IJ SOLN: 5.0000 mg | INTRAMUSCULAR | Status: DC | PRN

## 2020-05-24 MED ORDER — NICOTINE 21 MG/24HR TD PT24
21.0000 mg | MEDICATED_PATCH | Freq: Every day | TRANSDERMAL | Status: DC
  Filled 2020-05-24 (×4): qty 1

## 2020-05-24 MED ORDER — OXYCODONE HCL 5 MG PO TABS
5.0000 mg | ORAL_TABLET | Freq: Once | ORAL | Status: AC
  Administered 2020-05-24: 5 mg via ORAL
  Filled 2020-05-24: qty 1

## 2020-05-24 MED ORDER — SODIUM CHLORIDE 0.9 % IV SOLN
2.0000 g | INTRAVENOUS | Status: DC
  Administered 2020-05-25: 2 g via INTRAVENOUS
  Filled 2020-05-24 (×2): qty 20

## 2020-05-24 MED ORDER — METRONIDAZOLE IN NACL 5-0.79 MG/ML-% IV SOLN
500.0000 mg | Freq: Three times a day (TID) | INTRAVENOUS | Status: DC
  Administered 2020-05-24 – 2020-05-25 (×3): 500 mg via INTRAVENOUS
  Filled 2020-05-24 (×6): qty 100

## 2020-05-24 MED ORDER — FLUOXETINE HCL 20 MG PO CAPS
20.0000 mg | ORAL_CAPSULE | Freq: Every day | ORAL | Status: DC
  Administered 2020-05-24 – 2020-05-28 (×5): 20 mg via ORAL
  Filled 2020-05-24 (×5): qty 1

## 2020-05-24 MED ORDER — AMLODIPINE BESYLATE 5 MG PO TABS
5.0000 mg | ORAL_TABLET | Freq: Every day | ORAL | Status: DC
  Administered 2020-05-24 – 2020-05-28 (×5): 5 mg via ORAL
  Filled 2020-05-24 (×5): qty 1

## 2020-05-24 MED ORDER — OXYCODONE-ACETAMINOPHEN 5-325 MG PO TABS
1.0000 | ORAL_TABLET | ORAL | Status: DC | PRN
  Administered 2020-05-24 – 2020-05-26 (×2): 1 via ORAL
  Filled 2020-05-24 (×2): qty 1

## 2020-05-24 MED ORDER — ONDANSETRON HCL 4 MG/2ML IJ SOLN: 4.0000 mg | Freq: Three times a day (TID) | INTRAMUSCULAR | Status: DC | PRN

## 2020-05-24 MED ORDER — INSULIN GLARGINE 100 UNIT/ML ~~LOC~~ SOLN
5.0000 [IU] | Freq: Every day | SUBCUTANEOUS | Status: DC
  Filled 2020-05-24: qty 0.05

## 2020-05-24 NOTE — ED Notes (Signed)
States he is not having any n/v at this time  conts' to complain of stomach pain

## 2020-05-24 NOTE — H&P (Signed)
History and Physical    Roger Pennington INO:[id number] DOB: [birth year] DOA: 05/24/2020  Referring MD/NP/PA:   PCP: Center, Daphne   Patient coming from:  The patient is coming from home.  At baseline, pt is independent for most of ADL.        Chief Complaint: Perineal abscess  HPI: Roger Pennington is a 44 y.o. male with medical history significant of hypertension, diabetes mellitus, depression, anxiety, tobacco abuse, history of suicidal ideation, GI bleeding, who presents with perineal abscess.  Pt states that he has not been feeling good in the past several days.  He has an abscess in the right perineal area, which is actively draining.  Patient has generalized weakness, fatigue, poor appetite, poor oral intake, fever, chills, body aches for several days. He also reports nausea, vomited once, and mild lower abdominal pain. No diarrhea.  He also reports mild cough, no shortness of breath, but has a chest pain.  Patient states that his chest pain is located in the left side of the chest, 4 out of 10 severity in sitting position, 10 out of 10 when laying down, sharp, nonradiating.  Chest pain is not pleuritic and not exertional. Patient does not have symptoms of UTI.  No unilateral weakness.  Denies suicidal homicidal ideations.  ED Course: pt was found to have WBC 11.7, lactic acid 2.0, troponin 3-->3, negative COVID-19 PCR, electrolytes renal function okay, temperature 99.4, blood pressure 122/70, tachycardia, RR 16, oxygen saturation 98% on room air.  Chest x-ray showed possible right upper lobe infiltration, but CT of chest showed clear lungs without infiltration.  Patient is placed on MedSurg bed for observation.  CT of chest and abdomen/pelvis: 1. Clear lungs. No pneumonia or other infiltrate as suggested on the current chest radiograph. 2. Prominent to mildly enlarged axillary and inguinal lymph nodes, which are nonspecific and may all be reactive. A  lymphoproliferative disorder is possible 3. Inflammatory changes and subcutaneous air along the right perineum extending from the soft tissues just above the right scrotum. No evidence of an abscess. 4. No other acute abnormality within the chest, abdomen or pelvis.   Review of Systems:   General: has fevers, chills, no body weight gain, has poor appetite, has fatigue HEENT: no blurry vision, hearing changes or sore throat Respiratory: no dyspnea, has coughing, no wheezing CV: has chest pain, no palpitations GI: has nausea, vomiting, mild lower abdominal pain, no diarrhea, constipation GU: no dysuria, burning on urination, increased urinary frequency, hematuria  Ext: no leg edema Neuro: no unilateral weakness, numbness, or tingling, no vision change or hearing loss Skin: has a draining abscess in right groin area. MSK: No muscle spasm, no deformity, no limitation of range of movement in spin Heme: No easy bruising.  Travel history: No recent long distant travel.  Allergy: No Known Allergies  Past Medical History:  Diagnosis Date  . Diabetes mellitus without complication ([place])   . Hypertension   . Tobacco abuse     Past Surgical History:  Procedure Laterality Date  . ESOPHAGOGASTRODUODENOSCOPY (EGD) WITH PROPOFOL N/A 02/09/2016   Procedure: ESOPHAGOGASTRODUODENOSCOPY (EGD) WITH PROPOFOL;  Surgeon: Lollie Sails, MD;  Location: [facility];  Service: Endoscopy;  Laterality: N/A;    Social History:  reports that he has been smoking. He has never used smokeless tobacco. He reports current alcohol use. He reports that he does not use drugs.  Family History:  Family History  Problem Relation Age of Onset  . Hypertension  Father   . Hypertension Brother      Prior to Admission medications   Medication Sig Start Date End Date Taking? Authorizing Provider  amLODipine (NORVASC) 5 MG tablet Take 5 mg by mouth daily.   Yes [provider]  metFORMIN (GLUCOPHAGE) 1000  MG tablet Take 1,000 mg by mouth 2 (two) times daily with a meal.   Yes [provider]  FLUoxetine (PROZAC) 20 MG capsule Take 1 capsule (20 mg total) by mouth daily. 06/07/19   Clapacs, Madie Reno, MD  glipiZIDE (GLUCOTROL XL) 2.5 MG 24 hr tablet Take 1 tablet (2.5 mg total) by mouth daily with breakfast. 05/10/20   Laban Emperor, PA-C    Physical Exam: Vitals:   05/24/20 0817 05/24/20 0818 05/24/20 1126  BP: 120/73  122/70  Pulse: (!) 108  100  Resp: 15  16  Temp: 99.4 F (37.4 C)  99 F (37.2 C)  TempSrc: Oral  Oral  SpO2: 98%  98%  Weight:  104.3 kg   Height:  6' (1.829 m)    General: Not in acute distress HEENT:       Eyes: PERRL, EOMI, no scleral icterus.       ENT: No discharge from the ears and nose, no pharynx injection, no tonsillar enlargement.        Neck: No JVD, no bruit, no mass felt. Heme: No neck lymph node enlargement. Cardiac: S1/S2, RRR, No murmurs, No gallops or rubs. Respiratory:  No rales, wheezing, rhonchi or rubs. GI: Soft, nondistended, has mild tenderness in lower abdomen, no rebound pain, no organomegaly, BS present. GU: No hematuria Ext: No pitting leg edema bilaterally. 2+DP/PT pulse bilaterally. Musculoskeletal: No joint deformities, No joint redness or warmth, no limitation of ROM in spin. Skin:  has a draining abscess in right groin area, with surrounding induration and mild erythema with tenderness Neuro: Alert, oriented X3, cranial nerves II-XII grossly intact, moves all extremities normally.  Marland Kitchen Psych: Patient is not psychotic, no suicidal or hemocidal ideation.  Labs on Admission: I have personally reviewed following labs and imaging studies   CBC: Recent Labs  Lab 05/24/20 0955  WBC 11.7*  NEUTROABS 9.2*  HGB 11.9*  HCT 38.1*  MCV 79.5*  PLT 220   Basic Metabolic Panel: Recent Labs  Lab 05/24/20 0955  NA 135  K 4.1  CL 99  CO2 22  GLUCOSE 300*  BUN 16  CREATININE 1.12  CALCIUM 8.8*   GFR: Estimated Creatinine  Clearance: 104 mL/min (by C-G formula based on SCr of 1.12 mg/dL). Liver Function Tests: Recent Labs  Lab 05/24/20 0955  AST 14*  ALT 18  ALKPHOS 77  BILITOT 2.8*  PROT 7.4  ALBUMIN 3.6   Recent Labs  Lab 05/24/20 0955  LIPASE 20   No results for input(s): AMMONIA in the last 168 hours. Coagulation Profile: No results for input(s): INR, PROTIME in the last 168 hours. Cardiac Enzymes: No results for input(s): CKTOTAL, CKMB, CKMBINDEX, TROPONINI in the last 168 hours. BNP (last 3 results) No results for input(s): PROBNP in the last 8760 hours. HbA1C: No results for input(s): HGBA1C in the last 72 hours. CBG: Recent Labs  Lab 05/24/20 0833 05/24/20 1511  GLUCAP 297* 233*   Lipid Profile: No results for input(s): CHOL, HDL, LDLCALC, TRIG, CHOLHDL, LDLDIRECT in the last 72 hours. Thyroid Function Tests: No results for input(s): TSH, T4TOTAL, FREET4, T3FREE, THYROIDAB in the last 72 hours. Anemia Panel: No results for input(s): VITAMINB12, FOLATE, FERRITIN, TIBC,  IRON, RETICCTPCT in the last 72 hours. Urine analysis:    Component Value Date/Time   COLORURINE YELLOW (A) 05/10/2020 1021   APPEARANCEUR CLEAR (A) 05/10/2020 1021   APPEARANCEUR Clear 04/16/2015 1415   LABSPEC 1.036 (H) 05/10/2020 1021   LABSPEC 1.025 04/16/2015 1415   PHURINE 6.0 05/10/2020 1021   GLUCOSEU >=500 (A) 05/10/2020 1021   GLUCOSEU Negative 04/16/2015 1415   HGBUR NEGATIVE 05/10/2020 1021   BILIRUBINUR NEGATIVE 05/10/2020 1021   BILIRUBINUR Negative 04/16/2015 1415   KETONESUR 20 (A) 05/10/2020 1021   PROTEINUR NEGATIVE 05/10/2020 1021   NITRITE NEGATIVE 05/10/2020 1021   LEUKOCYTESUR NEGATIVE 05/10/2020 1021   LEUKOCYTESUR Negative 04/16/2015 1415   Sepsis Labs: @LABRCNTIP (procalcitonin:4,lacticidven:4) ) Recent Results (from the past 240 hour(s))  SARS Coronavirus 2 by RT PCR (hospital order, performed in [place] hospital lab) Nasopharyngeal Nasopharyngeal Swab     Status: None    Collection Time: 05/24/20  9:55 AM   Specimen: Nasopharyngeal Swab  Result Value Ref Range Status   SARS Coronavirus 2 NEGATIVE NEGATIVE Final    Comment: (NOTE) SARS-CoV-2 target nucleic acids are NOT DETECTED. The SARS-CoV-2 RNA is generally detectable in upper and lower respiratory specimens during the acute phase of infection. The lowest concentration of SARS-CoV-2 viral copies this assay can detect is 250 copies / mL. A negative result does not preclude SARS-CoV-2 infection and should not be used as the sole basis for treatment or other patient management decisions.  A negative result may occur with improper specimen collection / handling, submission of specimen other than nasopharyngeal swab, presence of viral mutation(s) within the areas targeted by this assay, and inadequate number of viral copies (<250 copies / mL). A negative result must be combined with clinical observations, patient history, and epidemiological information. Fact Sheet for Patients:   StrictlyIdeas.no Fact Sheet for Healthcare Providers: BankingDealers.co.za This test is not yet approved or cleared  by the [place] FDA and has been authorized for detection and/or diagnosis of SARS-CoV-2 by FDA under an Emergency Use Authorization (EUA).  This EUA will remain in effect (meaning this test can be used) for the duration of the COVID-19 declaration under Section 564(b)(1) of the Act, 21 U.S.C. section 360bbb-3(b)(1), unless the authorization is terminated or revoked sooner. Performed at [facility], [address] [place], [place] [id number]      Radiological Exams on Admission: DG Chest 1 View  Result Date: 05/24/2020 CLINICAL DATA:  Chest pain EXAM: CHEST  1 VIEW COMPARISON:  06/05/2013 FINDINGS: Airspace disease in the right upper lobe concerning for pneumonia. No focal opacity on the left. Heart is normal size. No effusions. No acute bony  abnormality. IMPRESSION: Right upper lobe airspace opacity concerning for pneumonia. Followup PA and lateral chest X-ray is recommended in 3-4 weeks following trial of antibiotic therapy to ensure resolution and exclude underlying malignancy. Electronically Signed   By: Rolm Baptise M.D.   On: 05/24/2020 10:16   CT Chest W Contrast  Result Date: 05/24/2020 CLINICAL DATA:  Presents with body aches States he developed right groin pain,chest pain and h/a on Friday also has had some n/v States he is able to keep fluids down but not able to eat States last time vomited was this am Low grade fever. Abnormal CXR EXAM: CT CHEST, ABDOMEN, AND PELVIS WITH CONTRAST TECHNIQUE: Multidetector CT imaging of the chest, abdomen and pelvis was performed following the standard protocol during bolus administration of intravenous contrast. CONTRAST:  147m OMNIPAQUE IOHEXOL 300 MG/ML  SOLN COMPARISON:  Current chest radiograph.  CT, 02/08/2016. FINDINGS: CT CHEST FINDINGS Cardiovascular: Heart is normal in size and configuration. No pericardial effusion. No coronary artery calcifications. Normal great vessels. Mediastinum/Nodes: Prominent to mildly enlarged bilateral axillary lymph nodes, largest on the side, 12 mm in short axis. No mediastinal or hilar masses. No enlarged mediastinal or hilar lymph nodes. Normal trachea and esophagus. Lungs/Pleura: Lungs are clear. No pleural effusion or pneumothorax. Musculoskeletal: No fracture or acute finding. No bone lesion. Mild disc degenerative changes along the thoracic spine. No chest wall mass. CT ABDOMEN PELVIS FINDINGS Hepatobiliary: No focal liver abnormality is seen. No gallstones, gallbladder wall thickening, or biliary dilatation. Pancreas: Unremarkable. No pancreatic ductal dilatation or surrounding inflammatory changes. Spleen: Normal in size without focal abnormality. Adrenals/Urinary Tract: No adrenal masses. Kidneys normal in size, orientation and position with symmetric  enhancement and excretion. 2.2 cm cyst, upper pole of the left kidney. Subcentimeter low-attenuation mass in the medial upper pole the left kidney and lower pole the right kidney, both also consistent with cysts, and stable from the prior CT. No other masses, no stones and no hydronephrosis. Normal ureters. Normal bladder. Stomach/Bowel: Stomach is within normal limits. Appendix appears normal. No evidence of bowel wall thickening, distention, or inflammatory changes. Vascular/Lymphatic: No vascular abnormality. There are prominent inguinal lymph nodes, largest on the right measuring 11 mm in short axis. Reproductive: Inflammation and subcutaneous air extends from the soft tissues just superior to the right scrotal along right inferior perineum. No defined fluid collection to suggest an abscess. Other: No ascites or abdominal wall hernia. Musculoskeletal: No fracture or acute finding. No bone lesion. Mild disc degenerative changes at L5-S1 IMPRESSION: 1. Clear lungs. No pneumonia or other infiltrate as suggested on the current chest radiograph. 2. Prominent to mildly enlarged axillary and inguinal lymph nodes, which are nonspecific and may all be reactive. A lymphoproliferative disorder is possible 3. Inflammatory changes and subcutaneous air along the right perineum extending from the soft tissues just above the right scrotum. No evidence of an abscess. 4. No other acute abnormality within the chest, abdomen or pelvis. Electronically Signed   By: Lajean Manes M.D.   On: 05/24/2020 12:07   CT ABDOMEN PELVIS W CONTRAST  Result Date: 05/24/2020 CLINICAL DATA:  Presents with body aches States he developed right groin pain,chest pain and h/a on Friday also has had some n/v States he is able to keep fluids down but not able to eat States last time vomited was this am Low grade fever. Abnormal CXR EXAM: CT CHEST, ABDOMEN, AND PELVIS WITH CONTRAST TECHNIQUE: Multidetector CT imaging of the chest, abdomen and pelvis was  performed following the standard protocol during bolus administration of intravenous contrast. CONTRAST:  147m OMNIPAQUE IOHEXOL 300 MG/ML  SOLN COMPARISON:  Current chest radiograph.  CT, 02/08/2016. FINDINGS: CT CHEST FINDINGS Cardiovascular: Heart is normal in size and configuration. No pericardial effusion. No coronary artery calcifications. Normal great vessels. Mediastinum/Nodes: Prominent to mildly enlarged bilateral axillary lymph nodes, largest on the side, 12 mm in short axis. No mediastinal or hilar masses. No enlarged mediastinal or hilar lymph nodes. Normal trachea and esophagus. Lungs/Pleura: Lungs are clear. No pleural effusion or pneumothorax. Musculoskeletal: No fracture or acute finding. No bone lesion. Mild disc degenerative changes along the thoracic spine. No chest wall mass. CT ABDOMEN PELVIS FINDINGS Hepatobiliary: No focal liver abnormality is seen. No gallstones, gallbladder wall thickening, or biliary dilatation. Pancreas: Unremarkable. No pancreatic ductal dilatation or surrounding inflammatory changes. Spleen: Normal  in size without focal abnormality. Adrenals/Urinary Tract: No adrenal masses. Kidneys normal in size, orientation and position with symmetric enhancement and excretion. 2.2 cm cyst, upper pole of the left kidney. Subcentimeter low-attenuation mass in the medial upper pole the left kidney and lower pole the right kidney, both also consistent with cysts, and stable from the prior CT. No other masses, no stones and no hydronephrosis. Normal ureters. Normal bladder. Stomach/Bowel: Stomach is within normal limits. Appendix appears normal. No evidence of bowel wall thickening, distention, or inflammatory changes. Vascular/Lymphatic: No vascular abnormality. There are prominent inguinal lymph nodes, largest on the right measuring 11 mm in short axis. Reproductive: Inflammation and subcutaneous air extends from the soft tissues just superior to the right scrotal along right inferior  perineum. No defined fluid collection to suggest an abscess. Other: No ascites or abdominal wall hernia. Musculoskeletal: No fracture or acute finding. No bone lesion. Mild disc degenerative changes at L5-S1 IMPRESSION: 1. Clear lungs. No pneumonia or other infiltrate as suggested on the current chest radiograph. 2. Prominent to mildly enlarged axillary and inguinal lymph nodes, which are nonspecific and may all be reactive. A lymphoproliferative disorder is possible 3. Inflammatory changes and subcutaneous air along the right perineum extending from the soft tissues just above the right scrotum. No evidence of an abscess. 4. No other acute abnormality within the chest, abdomen or pelvis. Electronically Signed   By: Lajean Manes M.D.   On: 05/24/2020 12:07     EKG: Independently reviewed.  Sinus rhythm, QTC 436, LAE, nonspecific to change  Assessment/Plan Principal Problem:   Perineal abscess and cellulitis Active Problems:   Severe recurrent major depression (HCC)   Tobacco use disorder   Essential hypertension   Sepsis ([place])   Diabetes mellitus without complication ([place])   Chest pain   Lower abdominal pain   Sepsis due to perineal abscess and cellulitis: Patient meets critical for sepsis with leukocytosis, fever, tachycardia.  Lactic acid 2.0.  Currently hemodynamically stable  - Placed on MedSurg bed for observation - Empiric antimicrobial treatment with  Flagyl, Rocephin - PRN Zofran for nausea, Percocet for pain - Blood cultures x 2  - ESR and CRP - wound care consult - IVF: total of 2L of NS bolus in ED, followed by 100 cc/h  Severe recurrent major depression ([place]): no SI or HI -Prozac  Tobacco use disorder -Nicotine patch  HTN:  -Continue home medications: Amlodipine -hydralazine prn  Diabetes mellitus without complication ([place]): Most recent A1c not on record. Patient is taking Metformin and glipizide at home.  Blood sugar 300 -will start Lantus 20 U  daily -SSI  Chest pain: Etiology is not clear.  His chest pain is positional.  Troponin 3 -->3. Will need to rule out pericarditis - Trend Trop - Repeat EKG in the am  - aspirin - Risk factor stratification: will check FLP and A1C  - check UDS - 2d echo  Lower abdominal pain: Possibly related to the perineal abscess.  Lipase normal.  CT scan of abdomen/pelvis is not impressive. -Pain control as above       DVT ppx: SQ Heparin    Code Status: Full code Family Communication: not done, no family member is at bed side.     Disposition Plan:  Anticipate discharge back to previous environment Consults called:  none Admission status: Med-surg bed for obs   Status is: Observation  The patient remains OBS appropriate and will d/c before 2 midnights.  Dispo: The patient is from:  Home              Anticipated d/c is to: Home              Anticipated d/c date is: 1 day              Patient currently is not medically stable to d/c.          Date of Service 05/24/2020    Ivor Costa [facility]   If 7PM-7AM, please contact night-coverage [email or web address] 05/24/2020, 6:32 PM

## 2020-05-24 NOTE — Progress Notes (Signed)
Inpatient Diabetes Program Recommendations  AACE/ADA: New Consensus Statement on Inpatient Glycemic Control (2015)  Target Ranges:  Prepandial:   less than 140 mg/dL      Peak postprandial:   less than 180 mg/dL (1-2 hours)      Critically ill patients:  140 - 180 mg/dL   Lab Results  Component Value Date   GLUCAP 297 (H) 05/24/2020    Review of Glycemic Control  Diabetes history: DM2 since March 2021 Outpatient Diabetes medications: Glucotrol 2.5 mg + Metformin 1 gm bid-Unable to afford Trajenta Current orders for Inpatient glycemic control: None  Inpatient Diabetes Program Recommendations:   Patient in emergency room with [facility] @ work. Per chart patient is unable to afford his Trajenta that was prescribed. Patient does not have insurance @ this time.  Spoke with patient regarding diabetes management. Patient is uncertain what the third medication is that he has been taking, and asked to have family to bring to the hospital or give list to make sure. States he recently had an A1c of approximately 12.0 @ his doctor's office.  While in the hospital and oral meds held: -Lantus 20 units daily  (0.2 units/kg x 104.3 kg ) -Novolog sensitive correction q 4 hrs. Secure chat sent to Dr. Roxan Hockey.  Thank you, Roger Pennington. Roger Boisclair, RN, MSN, CDE  Diabetes Coordinator Inpatient Glycemic Control Team Team Pager [phone] (8am-5pm) 05/24/2020 2:25 PM

## 2020-05-24 NOTE — ED Notes (Signed)
See triage note  Presents with body aches  States he developed leg pain,chest pain and h/a on Friday  also has had some n/v  States he is able to keep fluids down but not able to eat  States last time vomited was this am  Low grade fever on arrival

## 2020-05-24 NOTE — ED Triage Notes (Signed)
C/O generalized body aches and chills since Friday.  Also c/o generalized fatigue.  AAOx3.  Skin warm and dry. NAD

## 2020-05-24 NOTE — ED Provider Notes (Signed)
[facility] Emergency Department Provider Note  ____________________________________________  Time seen: Approximately 9:37 AM  I have reviewed the triage vital signs and the nursing notes.   HISTORY  Chief Complaint Generalized Body Aches    HPI Roger Pennington is a 44 y.o. male that presents to the emergency department for evaluation of headache, lower abdominal discomfort, vomiting for 3 days.  Patient also states that he has a draining abscess to his right perineum.  Patient states that it has been draining actively for 2 days.  Patient states that he has some discomfort to the left side of his chest when he sits up and describes the sensation as "spinning."  He had a cough Saturday and yesterday but no cough today.  Patient states that he has not been able to eat due to vomiting.  He went to work this morning and vomited so his boss told him to come to the emergency department.  He was seen recently by myself for hyperglycemia.  He states that the medicine I prescribed him has been working well for his diabetes and his primary care has refilled the medication.  He is unaware of any fevers.  No sick contacts.  No shortness of breath  Past Medical History:  Diagnosis Date  . Diabetes mellitus without complication (HCC)   . Hypertension   . Tobacco abuse     Patient Active Problem List   Diagnosis Date Noted  . Perineal abscess and cellulitis 05/24/2020  . Sepsis (HCC) 05/24/2020  . Diabetes mellitus without complication (HCC) 05/24/2020  . Severe recurrent major depression without psychotic features (HCC) 06/05/2019  . Suicide attempt (HCC) 06/05/2019  . Essential hypertension 06/05/2019  . Social anxiety disorder 06/05/2019  . Intentional acetaminophen overdose (HCC) 06/04/2019  . Depression with suicidal ideation 06/04/2019  . Severe recurrent major depression (HCC) 07/02/2017  . Tobacco use disorder 07/02/2017  . Cannabis use disorder, severe,  dependence (HCC) 07/02/2017  . Upper GI bleed 02/08/2016    Past Surgical History:  Procedure Laterality Date  . ESOPHAGOGASTRODUODENOSCOPY (EGD) WITH PROPOFOL N/A 02/09/2016   Procedure: ESOPHAGOGASTRODUODENOSCOPY (EGD) WITH PROPOFOL;  Surgeon: Christena Deem, MD;  Location: [facility] ENDOSCOPY;  Service: Endoscopy;  Laterality: N/A;    Prior to Admission medications   Medication Sig Start Date End Date Taking? Authorizing Provider  amLODipine (NORVASC) 5 MG tablet Take 5 mg by mouth daily.   Yes [provider]  metFORMIN (GLUCOPHAGE) 1000 MG tablet Take 1,000 mg by mouth 2 (two) times daily with a meal.   Yes [provider]  FLUoxetine (PROZAC) 20 MG capsule Take 1 capsule (20 mg total) by mouth daily. 06/07/19   Clapacs, Jackquline Denmark, MD  glipiZIDE (GLUCOTROL XL) 2.5 MG 24 hr tablet Take 1 tablet (2.5 mg total) by mouth daily with breakfast. 05/10/20   Enid Derry, PA-C    Allergies Patient has no known allergies.  Family History  Problem Relation Age of Onset  . Hypertension Father   . Hypertension Brother     Social History Social History   Tobacco Use  . Smoking status: Current Every Day Smoker  . Smokeless tobacco: Never Used  Substance Use Topics  . Alcohol use: Yes    Alcohol/week: 0.0 standard drinks    Comment: occ  . Drug use: No     Review of Systems  Constitutional: Positive for chills. ENT: No upper respiratory complaints. Cardiovascular: Positive for chest discomfort. Respiratory: Positive for cough yesterday. No SOB. Gastrointestinal: Positive for  nausea, vomiting, abdominal discomfort. Genitourinary: Negative for dysuria. Musculoskeletal: Negative for musculoskeletal pain. Skin: Negative for rash, abrasions, lacerations, ecchymosis. Neurological: Negative for numbness or tingling.  Positive for headache.   ____________________________________________   PHYSICAL EXAM:  VITAL SIGNS: ED Triage Vitals  Enc Vitals Group     BP  05/24/20 0817 120/73     Pulse Rate 05/24/20 0817 (!) 108     Resp 05/24/20 0817 15     Temp 05/24/20 0817 99.4 F (37.4 C)     Temp Source 05/24/20 0817 Oral     SpO2 05/24/20 0817 98 %     Weight 05/24/20 0818 230 lb (104.3 kg)     Height 05/24/20 0818 6' (1.829 m)     Head Circumference --      Peak Flow --      Pain Score 05/24/20 0816 8     Pain Loc --      Pain Edu? --      Excl. in GC? --      Constitutional: Alert and oriented. Well appearing and in no acute distress. Eyes: Conjunctivae are normal. PERRL. EOMI. Head: Atraumatic. ENT:      Ears:      Nose: No congestion/rhinnorhea.      Mouth/Throat: Mucous membranes are moist.  Neck: No stridor.   Cardiovascular: Normal rate, regular rhythm.  Good peripheral circulation. Respiratory: Normal respiratory effort without tachypnea or retractions. Lungs CTAB. Good air entry to the bases with no decreased or absent breath sounds. Gastrointestinal: Bowel sounds 4 quadrants. Soft and nontender to palpation. No guarding or rigidity. No palpable masses. No distention.  Musculoskeletal: Full range of motion to all extremities. No gross deformities appreciated. Neurologic:  Normal speech and language. No gross focal neurologic deficits are appreciated.  Skin:  Skin is warm, dry and intact.  1 cm x 1 cm area of induration to right perineum with active yellow purulent drainage.  Tenderness and some induration extends throughout right perineum. Psychiatric: Mood and affect are normal. Speech and behavior are normal. Patient exhibits appropriate insight and judgement.   ____________________________________________   LABS (all labs ordered are listed, but only abnormal results are displayed)  Labs Reviewed  GLUCOSE, CAPILLARY - Abnormal; Notable for the following components:      Result Value   Glucose-Capillary 297 (*)    All other components within normal limits  CBC WITH DIFFERENTIAL/PLATELET - Abnormal; Notable for the  following components:   WBC 11.7 (*)    Hemoglobin 11.9 (*)    HCT 38.1 (*)    MCV 79.5 (*)    MCH 24.8 (*)    Neutro Abs 9.2 (*)    Monocytes Absolute 1.2 (*)    Abs Immature Granulocytes 0.08 (*)    All other components within normal limits  COMPREHENSIVE METABOLIC PANEL - Abnormal; Notable for the following components:   Glucose, Bld 300 (*)    Calcium 8.8 (*)    AST 14 (*)    Total Bilirubin 2.8 (*)    All other components within normal limits  LACTIC ACID, PLASMA - Abnormal; Notable for the following components:   Lactic Acid, Venous 2.0 (*)    All other components within normal limits  GLUCOSE, CAPILLARY - Abnormal; Notable for the following components:   Glucose-Capillary 233 (*)    All other components within normal limits  SARS CORONAVIRUS 2 BY RT PCR (HOSPITAL ORDER, PERFORMED IN [facility])  CULTURE, BLOOD (ROUTINE X 2)  CULTURE, BLOOD (  ROUTINE X 2)  LIPASE, BLOOD  LACTIC ACID, PLASMA  URINALYSIS, COMPLETE (UACMP) WITH MICROSCOPIC  HEMOGLOBIN A1C  TROPONIN I (HIGH SENSITIVITY)  TROPONIN I (HIGH SENSITIVITY)   ____________________________________________  EKG   ____________________________________________  RADIOLOGY Robinette Haines, personally viewed and evaluated these images (plain radiographs) as part of my medical decision making, as well as reviewing the written report by the radiologist.  DG Chest 1 View  Result Date: 05/24/2020 CLINICAL DATA:  Chest pain EXAM: CHEST  1 VIEW COMPARISON:  06/05/2013 FINDINGS: Airspace disease in the right upper lobe concerning for pneumonia. No focal opacity on the left. Heart is normal size. No effusions. No acute bony abnormality. IMPRESSION: Right upper lobe airspace opacity concerning for pneumonia. Followup PA and lateral chest X-ray is recommended in 3-4 weeks following trial of antibiotic therapy to ensure resolution and exclude underlying malignancy. Electronically Signed   By: Rolm Baptise M.D.   On:  05/24/2020 10:16   CT Chest W Contrast  Result Date: 05/24/2020 CLINICAL DATA:  Presents with body aches States he developed right groin pain,chest pain and h/a on Friday also has had some n/v States he is able to keep fluids down but not able to eat States last time vomited was this am Low grade fever. Abnormal CXR EXAM: CT CHEST, ABDOMEN, AND PELVIS WITH CONTRAST TECHNIQUE: Multidetector CT imaging of the chest, abdomen and pelvis was performed following the standard protocol during bolus administration of intravenous contrast. CONTRAST:  163mL OMNIPAQUE IOHEXOL 300 MG/ML  SOLN COMPARISON:  Current chest radiograph.  CT, 02/08/2016. FINDINGS: CT CHEST FINDINGS Cardiovascular: Heart is normal in size and configuration. No pericardial effusion. No coronary artery calcifications. Normal great vessels. Mediastinum/Nodes: Prominent to mildly enlarged bilateral axillary lymph nodes, largest on the side, 12 mm in short axis. No mediastinal or hilar masses. No enlarged mediastinal or hilar lymph nodes. Normal trachea and esophagus. Lungs/Pleura: Lungs are clear. No pleural effusion or pneumothorax. Musculoskeletal: No fracture or acute finding. No bone lesion. Mild disc degenerative changes along the thoracic spine. No chest wall mass. CT ABDOMEN PELVIS FINDINGS Hepatobiliary: No focal liver abnormality is seen. No gallstones, gallbladder wall thickening, or biliary dilatation. Pancreas: Unremarkable. No pancreatic ductal dilatation or surrounding inflammatory changes. Spleen: Normal in size without focal abnormality. Adrenals/Urinary Tract: No adrenal masses. Kidneys normal in size, orientation and position with symmetric enhancement and excretion. 2.2 cm cyst, upper pole of the left kidney. Subcentimeter low-attenuation mass in the medial upper pole the left kidney and lower pole the right kidney, both also consistent with cysts, and stable from the prior CT. No other masses, no stones and no hydronephrosis. Normal  ureters. Normal bladder. Stomach/Bowel: Stomach is within normal limits. Appendix appears normal. No evidence of bowel wall thickening, distention, or inflammatory changes. Vascular/Lymphatic: No vascular abnormality. There are prominent inguinal lymph nodes, largest on the right measuring 11 mm in short axis. Reproductive: Inflammation and subcutaneous air extends from the soft tissues just superior to the right scrotal along right inferior perineum. No defined fluid collection to suggest an abscess. Other: No ascites or abdominal wall hernia. Musculoskeletal: No fracture or acute finding. No bone lesion. Mild disc degenerative changes at L5-S1 IMPRESSION: 1. Clear lungs. No pneumonia or other infiltrate as suggested on the current chest radiograph. 2. Prominent to mildly enlarged axillary and inguinal lymph nodes, which are nonspecific and may all be reactive. A lymphoproliferative disorder is possible 3. Inflammatory changes and subcutaneous air along the right perineum extending from the soft tissues  just above the right scrotum. No evidence of an abscess. 4. No other acute abnormality within the chest, abdomen or pelvis. Electronically Signed   By: Amie Portland M.D.   On: 05/24/2020 12:07   CT ABDOMEN PELVIS W CONTRAST  Result Date: 05/24/2020 CLINICAL DATA:  Presents with body aches States he developed right groin pain,chest pain and h/a on Friday also has had some n/v States he is able to keep fluids down but not able to eat States last time vomited was this am Low grade fever. Abnormal CXR EXAM: CT CHEST, ABDOMEN, AND PELVIS WITH CONTRAST TECHNIQUE: Multidetector CT imaging of the chest, abdomen and pelvis was performed following the standard protocol during bolus administration of intravenous contrast. CONTRAST:  OMNIPAQUE IOHEXOL 300 MG/ML  SOLN COMPARISON:  Current chest radiograph.  CT, 02/08/2016. FINDINGS: CT CHEST FINDINGS Cardiovascular: Heart is normal in size and configuration. No  pericardial effusion. No coronary artery calcifications. Normal great vessels. Mediastinum/Nodes: Prominent to mildly enlarged bilateral axillary lymph nodes, largest on the side, 12 mm in short axis. No mediastinal or hilar masses. No enlarged mediastinal or hilar lymph nodes. Normal trachea and esophagus. Lungs/Pleura: Lungs are clear. No pleural effusion or pneumothorax. Musculoskeletal: No fracture or acute finding. No bone lesion. Mild disc degenerative changes along the thoracic spine. No chest wall mass. CT ABDOMEN PELVIS FINDINGS Hepatobiliary: No focal liver abnormality is seen. No gallstones, gallbladder wall thickening, or biliary dilatation. Pancreas: Unremarkable. No pancreatic ductal dilatation or surrounding inflammatory changes. Spleen: Normal in size without focal abnormality. Adrenals/Urinary Tract: No adrenal masses. Kidneys normal in size, orientation and position with symmetric enhancement and excretion. 2.2 cm cyst, upper pole of the left kidney. Subcentimeter low-attenuation mass in the medial upper pole the left kidney and lower pole the right kidney, both also consistent with cysts, and stable from the prior CT. No other masses, no stones and no hydronephrosis. Normal ureters. Normal bladder. Stomach/Bowel: Stomach is within normal limits. Appendix appears normal. No evidence of bowel wall thickening, distention, or inflammatory changes. Vascular/Lymphatic: No vascular abnormality. There are prominent inguinal lymph nodes, largest on the right measuring 11 mm in short axis. Reproductive: Inflammation and subcutaneous air extends from the soft tissues just superior to the right scrotal along right inferior perineum. No defined fluid collection to suggest an abscess. Other: No ascites or abdominal wall hernia. Musculoskeletal: No fracture or acute finding. No bone lesion. Mild disc degenerative changes at L5-S1 IMPRESSION: 1. Clear lungs. No pneumonia or other infiltrate as suggested on the  current chest radiograph. 2. Prominent to mildly enlarged axillary and inguinal lymph nodes, which are nonspecific and may all be reactive. A lymphoproliferative disorder is possible 3. Inflammatory changes and subcutaneous air along the right perineum extending from the soft tissues just above the right scrotum. No evidence of an abscess. 4. No other acute abnormality within the chest, abdomen or pelvis. Electronically Signed   By: Amie Portland M.D.   On: 05/24/2020 12:07    ____________________________________________    PROCEDURES  Procedure(s) performed:    Procedures    Medications  0.9 %  sodium chloride infusion ( Intravenous New Bag/Given 05/24/20 1506)  oxyCODONE-acetaminophen (PERCOCET/ROXICET) 5-325 MG per tablet 1 tablet (has no administration in time range)  ondansetron (ZOFRAN) injection 4 mg (has no administration in time range)  acetaminophen (TYLENOL) tablet 650 mg (has no administration in time range)  hydrALAZINE (APRESOLINE) injection 5 mg (has no administration in time range)  nicotine (NICODERM CQ - dosed in mg/24  hours) patch 21 mg (21 mg Transdermal Refused 05/24/20 1502)  insulin aspart (novoLOG) injection 0-9 Units (3 Units Subcutaneous Given 05/24/20 1521)  insulin glargine (LANTUS) injection 20 Units (has no administration in time range)  sodium chloride 0.9 % bolus 500 mL (0 mLs Intravenous Stopped 05/24/20 1211)  ondansetron (ZOFRAN) injection 4 mg (4 mg Intravenous Given 05/24/20 1013)  acetaminophen (TYLENOL) tablet 650 mg (650 mg Oral Given 05/24/20 1203)  iohexol (OMNIPAQUE) 300 MG/ML solution 100 mL (100 mLs Intravenous Contrast Given 05/24/20 1140)  cefTRIAXone (ROCEPHIN) 1 g in sodium chloride 0.9 % 100 mL IVPB (0 g Intravenous Stopped 05/24/20 1402)  oxyCODONE (Oxy IR/ROXICODONE) immediate release tablet 5 mg (5 mg Oral Given 05/24/20 1248)  sodium chloride 0.9 % bolus 500 mL (0 mLs Intravenous Stopped 05/24/20 1507)  sodium chloride 0.9 % bolus 1,000 mL (1,000 mLs  Intravenous New Bag/Given 05/24/20 1501)     ____________________________________________   INITIAL IMPRESSION / ASSESSMENT AND PLAN / ED COURSE  Pertinent labs & imaging results that were available during my care of the patient were reviewed by me and considered in my medical decision making (see chart for details).  Review of the [place] CSRS was performed in accordance of the [facility] prior to dispensing any controlled drugs.    Patient presented to the emergency department for evaluation of chills, body aches, vomiting, abscess.  Patient has a low-grade fever of 99.4.  He is tachycardic at 108.  He has a mild leukocytosis of 11.7.  Lactic acid elevated at 2.0.  Patient does have an actively draining abscess to his right perineum.  CT scan shows inflammatory changes and subcutaneous air along the right perineum.  I suspect this is from the draining abscess.  Patient was given 1 L of IV fluids, 1 g Rocephin in the emergency department.  Patient will be admitted for sepsis and abscess.  Report was given to the hospitalist who is agreeable with admission.   [name] was evaluated in Emergency Department on 05/24/2020 for the symptoms described in the history of present illness. He was evaluated in the context of the global COVID-19 pandemic, which necessitated consideration that the patient might be at risk for infection with the SARS-CoV-2 virus that causes COVID-19. Institutional protocols and algorithms that pertain to the evaluation of patients at risk for COVID-19 are in a state of rapid change based on information released by regulatory bodies including the CDC and federal and state organizations. These policies and algorithms were followed during the patient's care in the ED.  ____________________________________________  FINAL CLINICAL IMPRESSION(S) / ED DIAGNOSES  Final diagnoses:  Abscess  Sepsis without acute organ dysfunction, due to unspecified organism [facility])      NEW  MEDICATIONS STARTED DURING THIS VISIT:  ED Discharge Orders    None          This chart was dictated using voice recognition software/Dragon. Despite best efforts to proofread, errors can occur which can change the meaning. Any change was purely unintentional.    Enid Derry, PA-C 05/24/20 1555    Willy Eddy, MD 05/25/20 [phone]

## 2020-05-25 ENCOUNTER — Inpatient Hospital Stay: Payer: Self-pay | Admitting: Anesthesiology

## 2020-05-25 ENCOUNTER — Encounter
Admission: EM | Disposition: A | Discharge status: Home / Self Care | Payer: Self-pay | Source: Home / Self Care | Attending: Internal Medicine

## 2020-05-25 ENCOUNTER — Observation Stay
Admit: 2020-05-25 | Discharge: 2020-05-25 | Disposition: A | Discharge status: Home / Self Care | Payer: Self-pay | Attending: Internal Medicine | Admitting: Internal Medicine

## 2020-05-25 DIAGNOSIS — E876 Hypokalemia: Secondary | ICD-10-CM

## 2020-05-25 DIAGNOSIS — R1030 Lower abdominal pain, unspecified: Secondary | ICD-10-CM

## 2020-05-25 HISTORY — PX: INCISION AND DRAINAGE ABSCESS: SHX5864

## 2020-05-25 LAB — CBC
HCT: 32.3 % — ABNORMAL LOW (ref 39.0–52.0)
Hemoglobin: 10 g/dL — ABNORMAL LOW (ref 13.0–17.0)
MCH: 24.4 pg — ABNORMAL LOW (ref 26.0–34.0)
MCHC: 31 g/dL (ref 30.0–36.0)
MCV: 78.8 fL — ABNORMAL LOW (ref 80.0–100.0)
Platelets: 199 10*3/uL (ref 150–400)
RBC: 4.1 MIL/uL — ABNORMAL LOW (ref 4.22–5.81)
RDW: 12.3 % (ref 11.5–15.5)
WBC: 8.8 10*3/uL (ref 4.0–10.5)
nRBC: 0 % (ref 0.0–0.2)

## 2020-05-25 LAB — LIPID PANEL
Cholesterol: 112 mg/dL (ref 0–200)
HDL: 24 mg/dL — ABNORMAL LOW (ref >40)
LDL Cholesterol: 68 mg/dL (ref 0–99)
Total CHOL/HDL Ratio: 4.7 RATIO
Triglycerides: 101 mg/dL (ref <150)
VLDL: 20 mg/dL (ref 0–40)

## 2020-05-25 LAB — URINALYSIS, COMPLETE (UACMP) WITH MICROSCOPIC
Bacteria, UA: NONE SEEN
Bilirubin Urine: NEGATIVE
Glucose, UA: >=500 mg/dL — AB
Hgb urine dipstick: NEGATIVE
Ketones, ur: 20 mg/dL — AB
Leukocytes,Ua: NEGATIVE
Nitrite: NEGATIVE
Protein, ur: 30 mg/dL — AB
Specific Gravity, Urine: 1.03 (ref 1.005–1.030)
Squamous Epithelial / HPF: NONE SEEN (ref 0–5)
pH: 6 (ref 5.0–8.0)

## 2020-05-25 LAB — BASIC METABOLIC PANEL
Anion gap: 8 (ref 5–15)
BUN: 11 mg/dL (ref 6–20)
CO2: 24 mmol/L (ref 22–32)
Calcium: 8.4 mg/dL — ABNORMAL LOW (ref 8.9–10.3)
Chloride: 101 mmol/L (ref 98–111)
Creatinine, Ser: 0.85 mg/dL (ref 0.61–1.24)
GFR calc Af Amer: >60 mL/min (ref >60)
GFR calc non Af Amer: >60 mL/min (ref >60)
Glucose, Bld: 241 mg/dL — ABNORMAL HIGH (ref 70–99)
Potassium: 3 mmol/L — ABNORMAL LOW (ref 3.5–5.1)
Sodium: 133 mmol/L — ABNORMAL LOW (ref 135–145)

## 2020-05-25 LAB — C-REACTIVE PROTEIN: CRP: 14.4 mg/dL — ABNORMAL HIGH (ref <1.0)

## 2020-05-25 LAB — URINE DRUG SCREEN, QUALITATIVE (ARMC ONLY)
Amphetamines, Ur Screen: NOT DETECTED
Barbiturates, Ur Screen: NOT DETECTED
Benzodiazepine, Ur Scrn: NOT DETECTED
Cannabinoid 50 Ng, Ur ~~LOC~~: POSITIVE — AB
Cocaine Metabolite,Ur ~~LOC~~: NOT DETECTED
MDMA (Ecstasy)Ur Screen: NOT DETECTED
Methadone Scn, Ur: NOT DETECTED
Opiate, Ur Screen: NOT DETECTED
Phencyclidine (PCP) Ur S: NOT DETECTED
Tricyclic, Ur Screen: NOT DETECTED

## 2020-05-25 LAB — GLUCOSE, CAPILLARY
Glucose-Capillary: 132 mg/dL — ABNORMAL HIGH (ref 70–99)
Glucose-Capillary: 183 mg/dL — ABNORMAL HIGH (ref 70–99)
Glucose-Capillary: 206 mg/dL — ABNORMAL HIGH (ref 70–99)
Glucose-Capillary: 208 mg/dL — ABNORMAL HIGH (ref 70–99)
Glucose-Capillary: 227 mg/dL — ABNORMAL HIGH (ref 70–99)
Glucose-Capillary: 270 mg/dL — ABNORMAL HIGH (ref 70–99)

## 2020-05-25 LAB — ECHOCARDIOGRAM COMPLETE
Height: 72 in
Weight: 3753.11 oz

## 2020-05-25 LAB — TROPONIN I (HIGH SENSITIVITY): Troponin I (High Sensitivity): 5 ng/L (ref <18)

## 2020-05-25 LAB — HIV ANTIBODY (ROUTINE TESTING W REFLEX): HIV Screen 4th Generation wRfx: NONREACTIVE

## 2020-05-25 LAB — MAGNESIUM: Magnesium: 1.8 mg/dL (ref 1.7–2.4)

## 2020-05-25 LAB — SEDIMENTATION RATE: Sed Rate: 43 mm/hr — ABNORMAL HIGH (ref 0–15)

## 2020-05-25 SURGERY — INCISION AND DRAINAGE, ABSCESS
Anesthesia: General | Laterality: Right

## 2020-05-25 MED ORDER — ACETAMINOPHEN 500 MG PO TABS
ORAL_TABLET | ORAL | Status: AC
  Filled 2020-05-25: qty 2

## 2020-05-25 MED ORDER — ROCURONIUM BROMIDE 100 MG/10ML IV SOLN
INTRAVENOUS | Status: DC | PRN
  Administered 2020-05-25: 20 mg via INTRAVENOUS

## 2020-05-25 MED ORDER — MIDAZOLAM HCL 2 MG/2ML IJ SOLN
INTRAMUSCULAR | Status: AC
  Filled 2020-05-25: qty 2

## 2020-05-25 MED ORDER — VANCOMYCIN HCL 1500 MG/300ML IV SOLN
1500.0000 mg | Freq: Two times a day (BID) | INTRAVENOUS | Status: DC
  Administered 2020-05-26 – 2020-05-28 (×5): 1500 mg via INTRAVENOUS
  Filled 2020-05-25 (×6): qty 300

## 2020-05-25 MED ORDER — ONDANSETRON HCL 4 MG/2ML IJ SOLN
INTRAMUSCULAR | Status: DC | PRN
  Administered 2020-05-25: 4 mg via INTRAVENOUS

## 2020-05-25 MED ORDER — ONDANSETRON HCL 4 MG/2ML IJ SOLN
INTRAMUSCULAR | Status: AC
  Filled 2020-05-25: qty 2

## 2020-05-25 MED ORDER — MIDAZOLAM HCL 2 MG/2ML IJ SOLN
INTRAMUSCULAR | Status: DC | PRN
  Administered 2020-05-25: 2 mg via INTRAVENOUS

## 2020-05-25 MED ORDER — KETOROLAC TROMETHAMINE 30 MG/ML IJ SOLN
INTRAMUSCULAR | Status: DC | PRN
  Administered 2020-05-25: 30 mg via INTRAVENOUS

## 2020-05-25 MED ORDER — OXYCODONE HCL 5 MG/5ML PO SOLN: 5.0000 mg | Freq: Once | ORAL | Status: DC | PRN

## 2020-05-25 MED ORDER — OXYCODONE HCL 5 MG PO TABS
ORAL_TABLET | ORAL | Status: AC
  Filled 2020-05-25: qty 1

## 2020-05-25 MED ORDER — FENTANYL CITRATE (PF) 100 MCG/2ML IJ SOLN
INTRAMUSCULAR | Status: DC | PRN
  Administered 2020-05-25 (×2): 100 ug via INTRAVENOUS

## 2020-05-25 MED ORDER — FENTANYL CITRATE (PF) 250 MCG/5ML IJ SOLN
INTRAMUSCULAR | Status: AC
  Filled 2020-05-25: qty 5

## 2020-05-25 MED ORDER — SUCCINYLCHOLINE CHLORIDE 20 MG/ML IJ SOLN
INTRAMUSCULAR | Status: DC | PRN
  Administered 2020-05-25: 100 mg via INTRAVENOUS

## 2020-05-25 MED ORDER — LIDOCAINE HCL (PF) 2 % IJ SOLN
INTRAMUSCULAR | Status: AC
  Filled 2020-05-25: qty 5

## 2020-05-25 MED ORDER — VANCOMYCIN HCL 2000 MG/400ML IV SOLN
2000.0000 mg | Freq: Once | INTRAVENOUS | Status: AC
  Administered 2020-05-25: 2000 mg via INTRAVENOUS
  Filled 2020-05-25: qty 400

## 2020-05-25 MED ORDER — PROPOFOL 10 MG/ML IV BOLUS
INTRAVENOUS | Status: AC
  Filled 2020-05-25: qty 40

## 2020-05-25 MED ORDER — FENTANYL CITRATE (PF) 100 MCG/2ML IJ SOLN
INTRAMUSCULAR | Status: AC
  Administered 2020-05-25: 25 ug via INTRAVENOUS
  Filled 2020-05-25: qty 2

## 2020-05-25 MED ORDER — ONDANSETRON HCL 4 MG/2ML IJ SOLN: 4.0000 mg | Freq: Once | INTRAMUSCULAR | Status: DC | PRN

## 2020-05-25 MED ORDER — FENTANYL CITRATE (PF) 100 MCG/2ML IJ SOLN
25.0000 ug | INTRAMUSCULAR | Status: DC | PRN
  Administered 2020-05-25: 25 ug via INTRAVENOUS

## 2020-05-25 MED ORDER — ACETAMINOPHEN 500 MG PO TABS
1000.0000 mg | ORAL_TABLET | Freq: Once | ORAL | Status: AC
  Administered 2020-05-25: 1000 mg via ORAL

## 2020-05-25 MED ORDER — SUGAMMADEX SODIUM 200 MG/2ML IV SOLN
INTRAVENOUS | Status: DC | PRN
  Administered 2020-05-25: 220 mg via INTRAVENOUS

## 2020-05-25 MED ORDER — FENTANYL CITRATE (PF) 100 MCG/2ML IJ SOLN
INTRAMUSCULAR | Status: AC
  Filled 2020-05-25: qty 2

## 2020-05-25 MED ORDER — SUCCINYLCHOLINE CHLORIDE 200 MG/10ML IV SOSY
PREFILLED_SYRINGE | INTRAVENOUS | Status: AC
  Filled 2020-05-25: qty 20

## 2020-05-25 MED ORDER — KETOROLAC TROMETHAMINE 30 MG/ML IJ SOLN
INTRAMUSCULAR | Status: AC
  Filled 2020-05-25: qty 1

## 2020-05-25 MED ORDER — ROCURONIUM BROMIDE 10 MG/ML (PF) SYRINGE
PREFILLED_SYRINGE | INTRAVENOUS | Status: AC
  Filled 2020-05-25: qty 10

## 2020-05-25 MED ORDER — PROPOFOL 10 MG/ML IV BOLUS
INTRAVENOUS | Status: DC | PRN
  Administered 2020-05-25 (×2): 50 mg via INTRAVENOUS
  Administered 2020-05-25: 200 mg via INTRAVENOUS

## 2020-05-25 MED ORDER — LIDOCAINE HCL (CARDIAC) PF 100 MG/5ML IV SOSY
PREFILLED_SYRINGE | INTRAVENOUS | Status: DC | PRN
  Administered 2020-05-25: 100 mg via INTRAVENOUS

## 2020-05-25 MED ORDER — ENSURE MAX PROTEIN PO LIQD
11.0000 [oz_av] | Freq: Two times a day (BID) | ORAL | Status: DC
  Administered 2020-05-26 – 2020-05-28 (×5): 11 [oz_av] via ORAL
  Filled 2020-05-25: qty 330

## 2020-05-25 MED ORDER — OXYCODONE HCL 5 MG PO TABS: 5.0000 mg | ORAL_TABLET | Freq: Once | ORAL | Status: DC | PRN

## 2020-05-25 MED ORDER — ADULT MULTIVITAMIN W/MINERALS CH
1.0000 | ORAL_TABLET | Freq: Every day | ORAL | Status: DC
  Administered 2020-05-26 – 2020-05-28 (×3): 1 via ORAL
  Filled 2020-05-25 (×3): qty 1

## 2020-05-25 MED ORDER — PROPOFOL 10 MG/ML IV BOLUS
INTRAVENOUS | Status: AC
  Filled 2020-05-25: qty 20

## 2020-05-25 MED ORDER — POTASSIUM CHLORIDE CRYS ER 20 MEQ PO TBCR
40.0000 meq | EXTENDED_RELEASE_TABLET | Freq: Once | ORAL | Status: DC
  Filled 2020-05-25: qty 2

## 2020-05-25 MED ORDER — PIPERACILLIN-TAZOBACTAM 3.375 G IVPB
3.3750 g | Freq: Three times a day (TID) | INTRAVENOUS | Status: DC
  Administered 2020-05-25 – 2020-05-28 (×7): 3.375 g via INTRAVENOUS
  Filled 2020-05-25 (×7): qty 50

## 2020-05-25 MED ORDER — POTASSIUM CHLORIDE CRYS ER 20 MEQ PO TBCR
40.0000 meq | EXTENDED_RELEASE_TABLET | ORAL | Status: AC
  Administered 2020-05-25 (×2): 40 meq via ORAL
  Filled 2020-05-25 (×2): qty 2

## 2020-05-25 MED ORDER — OXYCODONE HCL 5 MG PO TABS
5.0000 mg | ORAL_TABLET | Freq: Once | ORAL | Status: AC
  Administered 2020-05-25: 5 mg via ORAL

## 2020-05-25 SURGICAL SUPPLY — 23 items
ADHESIVE MASTISOL STRL (MISCELLANEOUS) ×6 IMPLANT
BLADE CLIPPER SURG (BLADE) ×3 IMPLANT
BLADE SURG 15 STRL LF DISP TIS (BLADE) ×1 IMPLANT
BLADE SURG 15 STRL SS (BLADE) ×2
BNDG GAUZE 4.5X4.1 6PLY STRL (MISCELLANEOUS) ×3 IMPLANT
BRUSH SCRUB EZ  4% CHG (MISCELLANEOUS) ×2
BRUSH SCRUB EZ 4% CHG (MISCELLANEOUS) ×1 IMPLANT
CANISTER SUCT 3000ML PPV (MISCELLANEOUS) ×3 IMPLANT
COVER WAND RF STERILE (DRAPES) ×3 IMPLANT
DRAPE LAPAROTOMY 77X122 PED (DRAPES) ×3 IMPLANT
ELECT REM PT RETURN 9FT ADLT (ELECTROSURGICAL) ×3
ELECTRODE REM PT RTRN 9FT ADLT (ELECTROSURGICAL) ×1 IMPLANT
GLOVE BIO SURGEON STRL SZ7 (GLOVE) ×3 IMPLANT
GOWN STRL REUS W/ TWL LRG LVL3 (GOWN DISPOSABLE) ×2 IMPLANT
GOWN STRL REUS W/TWL LRG LVL3 (GOWN DISPOSABLE) ×4
NEEDLE HYPO 22GX1.5 SAFETY (NEEDLE) ×3 IMPLANT
NS IRRIG 1000ML POUR BTL (IV SOLUTION) ×3 IMPLANT
PACK BASIN MINOR (MISCELLANEOUS) ×3 IMPLANT
PAD ABD DERMACEA PRESS 5X9 (GAUZE/BANDAGES/DRESSINGS) ×3 IMPLANT
SOL PREP PVP 2OZ (MISCELLANEOUS) ×3
SOLUTION PREP PVP 2OZ (MISCELLANEOUS) ×1 IMPLANT
SPONGE LAP 18X18 RF (DISPOSABLE) ×3 IMPLANT
SWAB CULTURE AMIES ANAERIB BLU (MISCELLANEOUS) ×3 IMPLANT

## 2020-05-25 NOTE — Anesthesia Procedure Notes (Signed)
Procedure Name: Intubation Date/Time: 05/25/2020 6:43 PM Performed by: Waldo Laine, CRNA Pre-anesthesia Checklist: Patient identified, Patient being monitored, Timeout performed, Emergency Drugs available and Suction available Patient Re-evaluated:Patient Re-evaluated prior to induction Oxygen Delivery Method: Circle system utilized Preoxygenation: Pre-oxygenation with 100% oxygen Induction Type: IV induction Ventilation: Mask ventilation without difficulty Laryngoscope Size: 4 and McGraph Grade View: Grade I Tube type: Oral Tube size: 7.0 mm Number of attempts: 1 Airway Equipment and Method: Stylet Placement Confirmation: ETT inserted through vocal cords under direct vision,  positive ETCO2 and breath sounds checked- equal and bilateral Secured at: 21 cm Tube secured with: Tape Dental Injury: Teeth and Oropharynx as per pre-operative assessment

## 2020-05-25 NOTE — Progress Notes (Signed)
Inpatient Diabetes Program Recommendations  AACE/ADA: New Consensus Statement on Inpatient Glycemic Control   Target Ranges:  Prepandial:   less than 140 mg/dL      Peak postprandial:   less than 180 mg/dL (1-2 hours)      Critically ill patients:  140 - 180 mg/dL   Results for Roger Pennington, Roger Pennington (MRN [id number]) as of 05/25/2020 08:06  Ref. Range 05/24/2020 08:33 05/24/2020 15:11 05/24/2020 21:07 05/25/2020 01:01 05/25/2020 04:05 05/25/2020 07:48  Glucose-Capillary Latest Ref Range: 70 - 99 mg/dL 585 (H) 277 (H) 824 (H) 208 (H) 206 (H) 183 (H)   Review of Glycemic Control  Diabetes history: DM2 Outpatient Diabetes medications: Glipizide XL 2.5 mg QAM, Metformin 1000 mg BID Current orders for Inpatient glycemic control: Lantus 20 units daily, Novolog 0-9 units Q4H  Inpatient Diabetes Program Recommendations:   Insulin - Basal: Noted Lantus 20 units was ordered on 05/24/20 to start today. Noted Lantus 20 units was given at 8:03 am today. Would not recommend any changes with Lantus at this time.  Thanks, Orlando Penner, RN, MSN, CDE Diabetes Coordinator Inpatient Diabetes Program [phone] (Team Pager from 8am to 5pm)

## 2020-05-25 NOTE — Care Management (Signed)
MD and diabetes coordinator notified "Spoke with medication management.  They do not have Trajenta.  It is something they can potentially get through the manufacturer, however would take up to 6 weeks to get.  They have Metformin, humulin 70/30, basaglar, and Humalog

## 2020-05-25 NOTE — Progress Notes (Addendum)
Initial Nutrition Assessment  DOCUMENTATION CODES:   Obesity unspecified  INTERVENTION:   Ensure Max protein supplement BID, each supplement provides 150kcal and 30g of protein.  Double protein with meal trays   MVI daily  Diabetes diet education   NUTRITION DIAGNOSIS:   Increased nutrient needs related to wound healing as evidenced by increased estimated needs.  GOAL:   Patient will meet greater than or equal to 90% of their needs  MONITOR:   PO intake, Supplement acceptance, Labs, Weight trends, Skin, I & O's  REASON FOR ASSESSMENT:   Malnutrition Screening Tool    ASSESSMENT:   44 y.o. male with medical history significant for hypertension, diabetes mellitus, depression, anxiety, tobacco abuse, history of suicidal ideation and gastric ulcer who presents with perineal abscess.   Met with pt in room today. Pt reports decreased appetite and oral intake for several months pta. Pt is unsure why his appetite has been decreased; pt reports that sometimes he is hungry and will want to eat and sometimes he may not eat at all. Pt reports constant thirst at home. Per chart, pt has lost 35lbs(13%) over the past 2 months; this is significant. RD suspects weight loss is r/t to pt's uncontrolled DM. RD will add supplements and MVI to help pt meet his estimated needs and support wound healing. Pt ate 100% of his breakfast this morning but is currently NPO for I & D today. RD provided pt with diabetes education today.   Medications reviewed and include: aspirin, insulin, nicotine, KCl, NaCl @100ml /hr, ceftriaxone, metronidazole   Labs reviewed: Na 133(L), K 3.0(L), Mg 1.8 wnl Hgb 10.0(L), Hct 32.3(L), MCH 78.8(L), MCHC 24.4(L) cbgs- 208, 206, 183, 227 x 24 hrs AIC 10.4(H)- 6/7  NUTRITION - FOCUSED PHYSICAL EXAM:    Most Recent Value  Orbital Region  No depletion  Upper Arm Region  No depletion  Thoracic and Lumbar Region  No depletion  Buccal Region  No depletion  Temple Region   No depletion  Clavicle Bone Region  No depletion  Clavicle and Acromion Bone Region  No depletion  Scapular Bone Region  No depletion  Dorsal Hand  No depletion  Patellar Region  No depletion  Anterior Thigh Region  No depletion  Posterior Calf Region  No depletion  Edema (RD Assessment)  None  Hair  Reviewed  Eyes  Reviewed  Mouth  Reviewed  Skin  Reviewed  Nails  Reviewed     Diet Order:   Diet Order            Diet NPO time specified  Diet effective now             EDUCATION NEEDS:   Education needs have been addressed  Skin:  Skin Assessment: Reviewed RN Assessment(perineal abscess.)  Last BM:  6/7  Height:   Ht Readings from Last 1 Encounters:  05/24/20 6' (1.829 m)    Weight:   Wt Readings from Last 1 Encounters:  05/24/20 106.4 kg    Ideal Body Weight:  80.9 kg  BMI:  Body mass index is 31.81 kg/m.  Estimated Nutritional Needs:   Kcal:  2300-2600kcal/day  Protein:  115-130g/day  Fluid:  >2.4L/day  Koleen Distance MS, RD, LDN Please refer to [facility] for RD and/or RD on-call/weekend/after hours pager

## 2020-05-25 NOTE — Anesthesia Preprocedure Evaluation (Addendum)
Anesthesia Evaluation  Patient identified by MRN, date of birth, ID band Patient awake    Reviewed: Allergy & Precautions, H&P , NPO status , Patient's Chart, lab work & pertinent test results  Airway Mallampati: II  TM Distance: >3 FB Neck ROM: full    Dental  (+) Missing, Chipped   Pulmonary neg COPD, Current Smoker,    breath sounds clear to auscultation       Cardiovascular hypertension, (-) angina(-) Past MI and (-) Cardiac Stents (-) dysrhythmias  Rhythm:regular Rate:Normal     Neuro/Psych PSYCHIATRIC DISORDERS Anxiety Depression negative neurological ROS     GI/Hepatic negative GI ROS, Neg liver ROS,   Endo/Other  diabetes  Renal/GU      Musculoskeletal   Abdominal   Peds  Hematology  (+) Blood dyscrasia, anemia ,   Anesthesia Other Findings Past Medical History: No date: Diabetes mellitus without complication (HCC) No date: Hypertension No date: Tobacco abuse  Past Surgical History: 02/09/2016: ESOPHAGOGASTRODUODENOSCOPY (EGD) WITH PROPOFOL; N/A     Comment:  Procedure: ESOPHAGOGASTRODUODENOSCOPY (EGD) WITH               PROPOFOL;  Surgeon: Christena Deem, MD;  Location:               [facility] ENDOSCOPY;  Service: Endoscopy;  Laterality: N/A;  BMI    Body Mass Index: 31.81 kg/m      Reproductive/Obstetrics negative OB ROS                            Anesthesia Physical Anesthesia Plan  ASA: III  Anesthesia Plan: General LMA   Post-op Pain Management:    Induction:   PONV Risk Score and Plan: Dexamethasone, Ondansetron, Midazolam and Treatment may vary due to age or medical condition  Airway Management Planned:   Additional Equipment:   Intra-op Plan:   Post-operative Plan:   Informed Consent: I have reviewed the patients History and Physical, chart, labs and discussed the procedure including the risks, benefits and alternatives for the proposed anesthesia with  the patient or authorized representative who has indicated his/her understanding and acceptance.     Dental Advisory Given  Plan Discussed with: Anesthesiologist, CRNA and Surgeon  Anesthesia Plan Comments:         Anesthesia Quick Evaluation

## 2020-05-25 NOTE — Progress Notes (Signed)
[place] [facility] [place] NAME: Roger Pennington    MR#:  [id number]  DATE OF BIRTH:  1976-03-02  SUBJECTIVE:  CHIEF COMPLAINT:   Chief Complaint  Patient presents with   Generalized Body Aches  Having a lot of pain in the perineal area REVIEW OF SYSTEMS:  Review of Systems  Constitutional: Positive for malaise/fatigue. Negative for diaphoresis, fever and weight loss.  HENT: Negative for ear discharge, ear pain, hearing loss, nosebleeds, sore throat and tinnitus.   Eyes: Negative for blurred vision and pain.  Respiratory: Negative for cough, hemoptysis, shortness of breath and wheezing.   Cardiovascular: Negative for chest pain, palpitations, orthopnea and leg swelling.  Gastrointestinal: Negative for abdominal pain, blood in stool, constipation, diarrhea, heartburn, nausea and vomiting.  Genitourinary: Negative for dysuria, frequency and urgency.  Musculoskeletal: Negative for back pain and myalgias.  Skin: Negative for itching and rash.       Right perineal area abscess  Neurological: Negative for dizziness, tingling, tremors, focal weakness, seizures, weakness and headaches.  Psychiatric/Behavioral: Negative for depression. The patient is not nervous/anxious.    DRUG ALLERGIES:  No Known Allergies VITALS:  Blood pressure 134/81, pulse 87, temperature 98 F (36.7 C), temperature source Oral, resp. rate 20, height 6' (1.829 m), weight 106.4 kg, SpO2 100 %. PHYSICAL EXAMINATION:  Physical Exam HENT:     Head: Normocephalic and atraumatic.  Eyes:     Conjunctiva/sclera: Conjunctivae normal.     Pupils: Pupils are equal, round, and reactive to light.  Neck:     Thyroid: No thyromegaly.     Trachea: No tracheal deviation.  Cardiovascular:     Rate and Rhythm: Normal rate and regular rhythm.     Heart sounds: Normal heart sounds.  Pulmonary:     Effort: Pulmonary effort is normal. No respiratory distress.     Breath sounds: Normal breath sounds. No  wheezing.  Chest:     Chest wall: No tenderness.  Abdominal:     General: Bowel sounds are normal. There is no distension.     Palpations: Abdomen is soft.     Tenderness: There is no abdominal tenderness.  Genitourinary:    Epididymis:     Left: Left epididymis enlarged:         Comments: Swelling, tenderness, erythema and drainage present Musculoskeletal:        General: Normal range of motion.     Cervical back: Normal range of motion and neck supple.  Skin:    General: Skin is warm and dry.     Findings: No rash.  Neurological:     Mental Status: He is alert and oriented to person, place, and time.     Cranial Nerves: No cranial nerve deficit.    LABORATORY PANEL:  Male CBC Recent Labs  Lab 05/25/20 0017  WBC 8.8  HGB 10.0*  HCT 32.3*  PLT 199   ------------------------------------------------------------------------------------------------------------------ Chemistries  Recent Labs  Lab 05/24/20 0955 05/24/20 0955 05/25/20 0017  NA 135   < > 133*  K 4.1   < > 3.0*  CL 99   < > 101  CO2 22   < > 24  GLUCOSE 300*   < > 241*  BUN 16   < > 11  CREATININE 1.12   < > 0.85  CALCIUM 8.8*   < > 8.4*  MG  --   --  1.8  AST 14*  --   --   ALT 18  --   --  ALKPHOS 77  --   --   BILITOT 2.8*  --   --    < > = values in this interval not displayed.   RADIOLOGY:  ECHOCARDIOGRAM COMPLETE  Result Date: 05/25/2020    ECHOCARDIOGRAM REPORT   Patient Name:   [name] Date of Exam: 05/25/2020 Medical Rec #:  [id number]          Height:       72.0 in Accession #:    [id number]         Weight:       234.6 lb Date of Birth:  1976-10-19           BSA:          2.280 m Patient Age:    44 years           BP:           134/90 mmHg Patient Gender: M                  HR:           84 bpm. Exam Location:  [facility] Procedure: 2D Echo, Color Doppler and Cardiac Doppler Indications:     R07.9 Chest Pain  History:         Patient has no prior history of Echocardiogram examinations.                   Risk Factors:Current Smoker, Hypertension and Diabetes.  Sonographer:     Humphrey Rolls RDCS (AE) Referring Phys:  [id number] Brien Few NIU Diagnosing Phys: Arnoldo Hooker MD IMPRESSIONS  1. Left ventricular ejection fraction, by estimation, is 45 to 50%. The left ventricle has mildly decreased function. The left ventricle has no regional wall motion abnormalities. Left ventricular diastolic parameters were normal.  2. Right ventricular systolic function is normal. The right ventricular size is normal.  3. The mitral valve is normal in structure. Trivial mitral valve regurgitation.  4. The aortic valve is normal in structure. Aortic valve regurgitation is not visualized. FINDINGS  Left Ventricle: Left ventricular ejection fraction, by estimation, is 45 to 50%. The left ventricle has mildly decreased function. The left ventricle has no regional wall motion abnormalities. The left ventricular internal cavity size was normal in size. There is no left ventricular hypertrophy. Left ventricular diastolic parameters were normal. Right Ventricle: The right ventricular size is normal. No increase in right ventricular wall thickness. Right ventricular systolic function is normal. Left Atrium: Left atrial size was normal in size. Right Atrium: Right atrial size was normal in size. Pericardium: There is no evidence of pericardial effusion. Mitral Valve: The mitral valve is normal in structure. Trivial mitral valve regurgitation. MV peak gradient, 2.2 mmHg. The mean mitral valve gradient is 1.0 mmHg. Tricuspid Valve: The tricuspid valve is normal in structure. Tricuspid valve regurgitation is trivial. Aortic Valve: The aortic valve is normal in structure. Aortic valve regurgitation is not visualized. Aortic valve mean gradient measures 3.0 mmHg. Aortic valve peak gradient measures 5.7 mmHg. Aortic valve area, by VTI measures 3.65 cm. Pulmonic Valve: The pulmonic valve was normal in structure. Pulmonic valve regurgitation is  trivial. Aorta: The aortic root and ascending aorta are structurally normal, with no evidence of dilitation. IAS/Shunts: No atrial level shunt detected by color flow Doppler.  LEFT VENTRICLE PLAX 2D LVIDd:         5.11 cm  Diastology LVIDs:         2.99 cm  LV e'  lateral:   10.70 cm/s LV PW:         0.90 cm  LV E/e' lateral: 7.1 LV IVS:        0.71 cm  LV e' medial:    7.51 cm/s LVOT diam:     2.30 cm  LV E/e' medial:  10.1 LV SV:         78 LV SV Index:   34 LVOT Area:     4.15 cm  RIGHT VENTRICLE RV Basal diam:  3.66 cm LEFT ATRIUM             Index       RIGHT ATRIUM           Index LA diam:        3.60 cm 1.58 cm/m  RA Area:     14.10 cm LA Vol (A2C):   37.3 ml 16.36 ml/m RA Volume:   35.50 ml  15.57 ml/m LA Vol (A4C):   32.0 ml 14.04 ml/m LA Biplane Vol: 36.6 ml 16.05 ml/m  AORTIC VALVE                   PULMONIC VALVE AV Area (Vmax):    4.08 cm    PV Vmax:       1.23 m/s AV Area (Vmean):   3.88 cm    PV Vmean:      81.300 cm/s AV Area (VTI):     3.65 cm    PV VTI:        0.230 m AV Vmax:           119.00 cm/s PV Peak grad:  6.1 mmHg AV Vmean:          85.700 cm/s PV Mean grad:  3.0 mmHg AV VTI:            0.213 m AV Peak Grad:      5.7 mmHg AV Mean Grad:      3.0 mmHg LVOT Vmax:         117.00 cm/s LVOT Vmean:        80.000 cm/s LVOT VTI:          0.187 m LVOT/AV VTI ratio: 0.88  AORTA Ao Root diam: 3.40 cm MITRAL VALVE MV Area (PHT): 3.65 cm    SHUNTS MV Peak grad:  2.2 mmHg    Systemic VTI:  0.19 m MV Mean grad:  1.0 mmHg    Systemic Diam: 2.30 cm MV Vmax:       0.75 m/s MV Vmean:      51.8 cm/s MV Decel Time: 208 msec MV E velocity: 76.20 cm/s MV A velocity: 62.60 cm/s MV E/A ratio:  1.22 Arnoldo Hooker MD Electronically signed by Arnoldo Hooker MD Signature Date/Time: 05/25/2020/1:53:29 PM    Final    ASSESSMENT AND PLAN:   Sepsis due to perineal abscess and cellulitis:  Present on admission - leukocytosis, fever, tachycardia.  - Lactic acid 2.0.  Currently hemodynamically stable - full  thickness wound opening in the perineum; lateral right side of the scrotum -Appreciate wound care input -Plan for I&D by surgery today  Severe recurrent major depression (HCC): no SI or HI -Prozac  Hypokalemia Replete and recheck  Tobacco use disorder -Nicotine patch  HTN:  -Continue home medications: Amlodipine -hydralazine prn  Diabetes mellitus without complication (HCC): Most recent A1c not on record. Patient is taking Metformin and glipizide at home.  Blood sugar 300 - Lantus 20 U daily -SSI  Chest pain:  Noncardiac, His chest pain is positional. Now resolved - aspirin - UDS + for marijuana - 2d echo normal limit  Lower abdominal pain:  Likely due to to the perineal abscess.  Lipase normal.  CT scan of abdomen/pelvis is not impressive. -Pain control as above   Status is: Inpatient  Remains inpatient appropriate because:Ongoing diagnostic testing needed not appropriate for outpatient work up   Dispo: The patient is from: Home              Anticipated d/c is to: Home              Anticipated d/c date is: 1 day              Patient currently is not medically stable to d/c.       DVT prophylaxis: Heparin Family Communication: Discussed with patient   All the records are reviewed and case discussed with Care Management/Social Worker. Management plans discussed with the patient, nursing and they are in agreement.  CODE STATUS: Full Code  TOTAL TIME TAKING CARE OF THIS PATIENT: 35 minutes.   More than 50% of the time was spent in counseling/coordination of care: YES  POSSIBLE D/C IN 1-2 DAYS, DEPENDING ON CLINICAL CONDITION.   Delfino Lovett M.D on 05/25/2020 at 5:19 PM  [facility]   CC: Primary care physician; Center, [facility]  Note: This dictation was prepared with Nurse, children's dictation along with smaller phrase technology. Any transcriptional errors that result from this process are unintentional.

## 2020-05-25 NOTE — Plan of Care (Signed)
Nutrition Education Note    Lab Results  Component Value Date   HGBA1C 10.4 (H) 05/24/2020    RD provided "Nutrition and Type II Diabetes" handout from the Academy of [employer] and Dietetics. Discussed different food groups and their effects on blood sugar, emphasizing carbohydrate-containing foods. Provided list of carbohydrates and recommended serving sizes of common foods.  Discussed importance of controlled and consistent carbohydrate intake throughout the day. Provided examples of ways to balance meals/snacks and encouraged intake of high-fiber, whole grain complex carbohydrates. Teach back method used.  Expect fair compliance.  Body mass index is 31.81 kg/m. Pt meets criteria for obesity based on current BMI.  RD following this patient   Betsey Holiday MS, RD, LDN Please refer to [facility] for RD and/or RD on-call/weekend/after hours pager

## 2020-05-25 NOTE — Transfer of Care (Signed)
Immediate Anesthesia Transfer of Care Note  Patient: Roger Pennington  Procedure(s) Performed: INCISION AND DRAINAGE ABSCESS (Right )  Patient Location: PACU  Anesthesia Type:General  Level of Consciousness: oriented and patient cooperative  Airway & Oxygen Therapy: Patient Spontanous Breathing and Patient connected to face mask oxygen  Post-op Assessment: Report given to RN and Post -op Vital signs reviewed and stable  Post vital signs: Reviewed and stable  Last Vitals:  Vitals Value Taken Time  BP 130/78 05/25/20 1930  Temp 37.3 C 05/25/20 1930  Pulse 94 05/25/20 1934  Resp 17 05/25/20 1934  SpO2 100 % 05/25/20 1934  Vitals shown include unvalidated device data.  Last Pain:  Vitals:   05/25/20 1930  TempSrc:   PainSc: (P) Asleep      Patients Stated Pain Goal: 2 (05/24/20 0816)  Complications: No apparent anesthesia complications

## 2020-05-25 NOTE — Consult Note (Signed)
WOC Nurse Consult Note: Reason for Consult: perineal abscess?  Wound type: full thickness opening in the perineum; lateral right side of the scrotum Pressure Injury POA: NA Measurement: probed with Qtip 4.5cm  Wound bed: unable to visualize  Drainage (amount, consistency, odor) thick yellow, purulent, with strong odor Periwound:edema; painful; erythema   Dressing procedure/placement/frequency: Plans for surgery to evaluate for I&D. Explained rationale to patient and he agrees with plan. If I&D would recommend 1/4" iodoform packing strip to keep open to wick site until closure.    Discussed POC with patient and bedside nurse.  Re consult if needed, will not follow at this time. Thanks  Dennette Faulconer [employer], RN,CWOCN, CNS, CWON-AP [phone])

## 2020-05-25 NOTE — Anesthesia Postprocedure Evaluation (Signed)
Anesthesia Post Note  Patient: Roger Pennington  Procedure(s) Performed: INCISION AND DRAINAGE ABSCESS (Right )  Patient location during evaluation: PACU Anesthesia Type: General Level of consciousness: awake and alert Pain management: pain level controlled Vital Signs Assessment: post-procedure vital signs reviewed and stable Respiratory status: spontaneous breathing, nonlabored ventilation and respiratory function stable Cardiovascular status: blood pressure returned to baseline and stable Postop Assessment: no apparent nausea or vomiting Anesthetic complications: no     Last Vitals:  Vitals:   05/25/20 2056 05/25/20 2138  BP: 126/68 123/82  Pulse: 84 80  Resp: 16 16  Temp: 36.7 C 36.7 C  SpO2: 98% 98%    Last Pain:  Vitals:   05/25/20 2138  TempSrc: Oral  PainSc:                  Karleen Hampshire

## 2020-05-25 NOTE — OR Nursing (Signed)
Pt K 3 this am , Dr. Priscella Mann aware and given instructions to give PO potassium as scheduled. This RN made Chyrel Masson, RN aware

## 2020-05-25 NOTE — Op Note (Signed)
  05/25/2020  7:15 PM  PATIENT:  Roger Pennington  44 y.o. 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 Incision and drainage of complex  Abscess PERINEUM 2. Excisional  debridement of skin subcutaneous tissue and muscle                               SURGEON:  Surgeon(s) and Role:    * Daryan Cagley F, MD - Primary  EBL: 10CC  ANESTHESIA: GETA  FINDINGS: NECROTIZING INFECTION PERINEUM RIGHT SIDE INVOLVING LATERAL SCROTUM W/O VIOLATING INTERNAL SPERMATIC FASCIA  DICTATION:  Patient was EXplaineD  About  the procedure in detail. Risks, benefits  And possible complications and a consent was obtained. The patient taken to the operating room and placed in the lithotomy position.   Exam revealed infection and abscess cavity extending from the right perineum into the lateral scrotum. I obtained cultures and unroof the cavity with electrocautery. Copious pus drained. Excisional debridement with curette was done until good granulation tissue was found. Irrigation with copious saline done.  Electrocautery used for hemostasis. Kerlix wrap placed as a wet/dry.    Needle and laparotomy counts were correct and there were no immediate complications  Leafy Ro, MD

## 2020-05-25 NOTE — Consult Note (Signed)
Pharmacy Antibiotic Note  Roger Pennington is a 44 y.o. 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  Pharmacy has been consulted for vancomycin and pip/tazo dosing.  Plan: Zosyn 3.375g IV q8h (4 hour infusion).   Will give vancomycin 2000 mg x 1 loading dose followed by 1500 mg q12H. Goal trough is 15-20. Plan to order trough level in the next 2-3 days.   Height: 6' (182.9 cm) Weight: 106.4 kg (234 lb 9.1 oz) IBW/kg (Calculated) : 77.6  Temp (24hrs), Avg:98.4 F (36.9 C), Min:98 F (36.7 C), Max:99.3 F (37.4 C)  Recent Labs  Lab 05/24/20 0955 05/24/20 1204 05/24/20 1409 05/25/20 0017  WBC 11.7*  --   --  8.8  CREATININE 1.12  --   --  0.85  LATICACIDVEN  --  2.0* 1.2  --     Estimated Creatinine Clearance: 138.3 mL/min (by C-G formula based on SCr of 0.85 mg/dL).    No Known Allergies  Antimicrobials this admission: 6/8 vancomycin >>  6/8 pip/tazo >>   Dose adjustments this admission: None  Microbiology results: 6/7 BCx: pending 6/7 Abscess; Wound Cx: pending  Thank you for allowing pharmacy to be a part of this patient's care.  Ronnald Ramp, PharmD, BCPS 05/25/2020 7:35 PM

## 2020-05-25 NOTE — Consult Note (Signed)
[facility] SURGICAL CONSULTATION NOTE (initial) - cpt: 24401   HISTORY OF PRESENT ILLNESS (HPI):  44 y.o. male presented to [facility] ED yesterday for evaluation of generalized body aches. Patient reports that he had not been feeling well over the last 3 days or so. He describes headache, body aches, fatigues, lower abdominal discomfort, emesis, and draining abscess in his right perineum. He first noticed this area on Friday evening and his significant other told him he had a boil there. He tried "boil cream" for this and noticed it popped and started draining foul smelling fluid onmSaturday and Sunday. Since that time he has noticed these vague constitutional symptoms. He also has a history of T2DM and has been seen in the ED for hyperglycemia recently as well, and his sugars at home have been 300-350 at home. No history of similar abscesses. Work up in the ED was concerning for leukocytosis to 11.7k (which is now resolved today), hyperglycemic to 300, lactic acidosis to 2.0 (whihch is resolved with IVF), and CT was concerning for right perineum infection. Wound cultures taken at presentation have grown abundant gram + cocci, and gram + rods. He was admitted to the medicine service and started on Rocephin and Flagyl.   Surgery is consulted by hospitalist physician Dr. Delfino Lovett, MD in this context for evaluation and management of right perineal abscess.  PAST MEDICAL HISTORY (PMH):  Past Medical History:  Diagnosis Date   Diabetes mellitus without complication (HCC)    Hypertension    Tobacco abuse      PAST SURGICAL HISTORY (PSH):  Past Surgical History:  Procedure Laterality Date   ESOPHAGOGASTRODUODENOSCOPY (EGD) WITH PROPOFOL N/A 02/09/2016   Procedure: ESOPHAGOGASTRODUODENOSCOPY (EGD) WITH PROPOFOL;  Surgeon: Christena Deem, MD;  Location: [facility] ENDOSCOPY;  Service: Endoscopy;  Laterality: N/A;     MEDICATIONS:  Prior to Admission medications   Medication Sig Start  Date End Date Taking? Authorizing Provider  amLODipine (NORVASC) 5 MG tablet Take 5 mg by mouth daily.   Yes [provider]  metFORMIN (GLUCOPHAGE) 1000 MG tablet Take 1,000 mg by mouth 2 (two) times daily with a meal.   Yes [provider]  FLUoxetine (PROZAC) 20 MG capsule Take 1 capsule (20 mg total) by mouth daily. 06/07/19   Clapacs, Jackquline Denmark, MD  glipiZIDE (GLUCOTROL XL) 2.5 MG 24 hr tablet Take 1 tablet (2.5 mg total) by mouth daily with breakfast. 05/10/20   Enid Derry, PA-C     ALLERGIES:  No Known Allergies   SOCIAL HISTORY:  Social History   Socioeconomic History   Marital status: Single    Spouse name: Not on file   Number of children: Not on file   Years of education: Not on file   Highest education level: Not on file  Occupational History   Not on file  Tobacco Use   Smoking status: Current Every Day Smoker   Smokeless tobacco: Never Used  Substance and Sexual Activity   Alcohol use: Yes    Alcohol/week: 0.0 standard drinks    Comment: occ   Drug use: No   Sexual activity: Not on file  Other Topics Concern   Not on file  Social History Narrative   Not on file   Social Determinants of Health   Financial Resource Strain:    Difficulty of Paying Living Expenses:   Food Insecurity:    Worried About Radiation protection practitioner of Food in the Last Year:    Barista in  the Last Year:   Transportation Needs:    Freight forwarder (Medical):    Lack of Transportation (Non-Medical):   Physical Activity:    Days of Exercise per Week:    Minutes of Exercise per Session:   Stress:    Feeling of Stress :   Social Connections:    Frequency of Communication with Friends and Family:    Frequency of Social Gatherings with Friends and Family:    Attends Religious Services:    Active Member of Clubs or Organizations:    Attends Engineer, structural:    Marital Status:   Intimate Partner Violence:    Fear of Current  or Ex-Partner:    Emotionally Abused:    Physically Abused:    Sexually Abused:      FAMILY HISTORY:  Family History  Problem Relation Age of Onset   Hypertension Father    Hypertension Brother       REVIEW OF SYSTEMS:  Review of Systems  Constitutional: Positive for chills and malaise/fatigue. Negative for fever.  Respiratory: Negative for cough and shortness of breath.   Cardiovascular: Negative for chest pain and palpitations.  Gastrointestinal: Positive for nausea. Negative for abdominal pain, diarrhea and vomiting.  Genitourinary: Negative for dysuria and urgency.  Skin:       + right Perineal Infection   Neurological: Negative for dizziness and headaches.  All other systems reviewed and are negative.   VITAL SIGNS:  Temp:  [98 F (36.7 C)-99.3 F (37.4 C)] 98.2 F (36.8 C) (06/08 0750) Pulse Rate:  [79-99] 81 (06/08 0750) Resp:  [16-20] 18 (06/08 0750) BP: (123-156)/(82-99) 134/90 (06/08 0750) SpO2:  [96 %-100 %] 99 % (06/08 0750) Weight:  [106.4 kg] 106.4 kg (06/07 2103)     Height: 6' (182.9 cm) Weight: 106.4 kg BMI (Calculated): 31.81   INTAKE/OUTPUT:  06/07 0701 - 06/08 0700 In: 1550.1 [P.O.:240; I.V.:1118; IV Piggyback:192.1] Out: 0   PHYSICAL EXAM:  Physical Exam Vitals and nursing note reviewed.  Constitutional:      General: He is not in acute distress.    Appearance: Normal appearance. He is obese. He is not ill-appearing.  HENT:     Head: Normocephalic and atraumatic.     Mouth/Throat:     Mouth: Mucous membranes are moist.  Eyes:     Conjunctiva/sclera: Conjunctivae normal.     Pupils: Pupils are equal, round, and reactive to light.  Cardiovascular:     Rate and Rhythm: Normal rate and regular rhythm.  Pulmonary:     Effort: Pulmonary effort is normal. No respiratory distress.  Genitourinary:   Musculoskeletal:     Right lower leg: No edema.     Left lower leg: No edema.  Skin:    General: Skin is warm and dry.  Neurological:       General: No focal deficit present.     Mental Status: He is alert and oriented to person, place, and time.  Psychiatric:        Mood and Affect: Mood normal.        Behavior: Behavior normal.      Labs:  CBC Latest Ref Rng & Units 05/25/2020 05/24/2020 05/10/2020  WBC 4.0 - 10.5 K/uL 8.8 11.7(H) 5.3  Hemoglobin 13.0 - 17.0 g/dL 10.0(L) 11.9(L) 12.3(L)  Hematocrit 39.0 - 52.0 % 32.3(L) 38.1(L) 39.5  Platelets 150 - 400 K/uL 199 196 164   CMP Latest Ref Rng & Units 05/25/2020 05/24/2020 05/10/2020  Glucose 70 - 99  mg/dL 241(H) 300(H) 344(H)  BUN 6 - 20 mg/dL 11 16 10   Creatinine 0.61 - 1.24 mg/dL 0.85 1.12 0.72  Sodium 135 - 145 mmol/L 133(L) 135 135  Potassium 3.5 - 5.1 mmol/L 3.0(L) 4.1 3.8  Chloride 98 - 111 mmol/L 101 99 102  CO2 22 - 32 mmol/L 24 22 24   Calcium 8.9 - 10.3 mg/dL 8.4(L) 8.8(L) 9.0  Total Protein 6.5 - 8.1 g/dL - 7.4 -  Total Bilirubin 0.3 - 1.2 mg/dL - 2.8(H) -  Alkaline Phos 38 - 126 U/L - 77 -  AST 15 - 41 U/L - 14(L) -  ALT 0 - 44 U/L - 18 -    Imaging studies:   CT Chest / Abdomen / Pelvis (05/24/2020) personally reviewed which shows subcutaneous air and inflammation just in the right perineum/scrotum, and radiologist report reviewed:  IMPRESSION: 1. Clear lungs. No pneumonia or other infiltrate as suggested on the current chest radiograph. 2. Prominent to mildly enlarged axillary and inguinal lymph nodes, which are nonspecific and may all be reactive. A lymphoproliferative disorder is possible 3. Inflammatory changes and subcutaneous air along the right perineum extending from the soft tissues just above the right scrotum. No evidence of an abscess. 4. No other acute abnormality within the chest, abdomen or pelvis.    Assessment/Plan: (ICD-10's: L74.215) 45 y.o. male with right perineal infection/abscess, complicated by pertinent comorbidities including uncontrolled T2DM.   - NPO  - Continue IV Abx   - Will plan for I&D in the OR this afternoon  with Dr Dahlia Byes pending OR/Anesthesia availability  - All risks, benefits, and alternatives to above procedure(s) were discussed with the patient, all of his questions were answered to his expressed satisfaction, patient expresses he wishes to proceed, and informed consent was obtained.  All of the above findings and recommendations were discussed with the patient, and all of patient's questions were answered to his expressed satisfaction.  Thank you for the opportunity to participate in this patient's care.   -- Edison Simon, PA-C [facility] 05/25/2020, 12:27 PM [phone] M-F: 7am - 4pm

## 2020-05-25 NOTE — Progress Notes (Signed)
*  PRELIMINARY RESULTS* Echocardiogram 2D Echocardiogram has been performed.  Roger Pennington [name] 05/25/2020, 10:51 AM

## 2020-05-26 DIAGNOSIS — E119 Type 2 diabetes mellitus without complications: Secondary | ICD-10-CM

## 2020-05-26 LAB — CBC
HCT: 33.2 % — ABNORMAL LOW (ref 39.0–52.0)
Hemoglobin: 10.1 g/dL — ABNORMAL LOW (ref 13.0–17.0)
MCH: 24.6 pg — ABNORMAL LOW (ref 26.0–34.0)
MCHC: 30.4 g/dL (ref 30.0–36.0)
MCV: 80.8 fL (ref 80.0–100.0)
Platelets: 214 10*3/uL (ref 150–400)
RBC: 4.11 MIL/uL — ABNORMAL LOW (ref 4.22–5.81)
RDW: 12.4 % (ref 11.5–15.5)
WBC: 7.5 10*3/uL (ref 4.0–10.5)
nRBC: 0 % (ref 0.0–0.2)

## 2020-05-26 LAB — AEROBIC CULTURE W GRAM STAIN (SUPERFICIAL SPECIMEN)

## 2020-05-26 LAB — BASIC METABOLIC PANEL
Anion gap: 6 (ref 5–15)
BUN: 7 mg/dL (ref 6–20)
CO2: 26 mmol/L (ref 22–32)
Calcium: 8.3 mg/dL — ABNORMAL LOW (ref 8.9–10.3)
Chloride: 106 mmol/L (ref 98–111)
Creatinine, Ser: 0.87 mg/dL (ref 0.61–1.24)
GFR calc Af Amer: >60 mL/min (ref >60)
GFR calc non Af Amer: >60 mL/min (ref >60)
Glucose, Bld: 198 mg/dL — ABNORMAL HIGH (ref 70–99)
Potassium: 3.5 mmol/L (ref 3.5–5.1)
Sodium: 138 mmol/L (ref 135–145)

## 2020-05-26 LAB — GLUCOSE, CAPILLARY
Glucose-Capillary: 133 mg/dL — ABNORMAL HIGH (ref 70–99)
Glucose-Capillary: 152 mg/dL — ABNORMAL HIGH (ref 70–99)
Glucose-Capillary: 176 mg/dL — ABNORMAL HIGH (ref 70–99)
Glucose-Capillary: 188 mg/dL — ABNORMAL HIGH (ref 70–99)
Glucose-Capillary: 197 mg/dL — ABNORMAL HIGH (ref 70–99)
Glucose-Capillary: 248 mg/dL — ABNORMAL HIGH (ref 70–99)

## 2020-05-26 MED ORDER — INSULIN GLARGINE 100 UNIT/ML ~~LOC~~ SOLN
25.0000 [IU] | Freq: Every day | SUBCUTANEOUS | Status: DC
  Administered 2020-05-27 – 2020-05-28 (×2): 25 [IU] via SUBCUTANEOUS
  Filled 2020-05-26 (×2): qty 0.25

## 2020-05-26 MED ORDER — KETOROLAC TROMETHAMINE 30 MG/ML IJ SOLN
30.0000 mg | Freq: Four times a day (QID) | INTRAMUSCULAR | Status: DC | PRN
  Administered 2020-05-26 – 2020-05-28 (×4): 30 mg via INTRAVENOUS
  Filled 2020-05-26 (×4): qty 1

## 2020-05-26 MED ORDER — OXYCODONE-ACETAMINOPHEN 5-325 MG PO TABS
1.0000 | ORAL_TABLET | ORAL | Status: DC | PRN
  Administered 2020-05-26 – 2020-05-28 (×5): 2 via ORAL
  Filled 2020-05-26 (×5): qty 2

## 2020-05-26 MED ORDER — HYDROMORPHONE HCL 1 MG/ML IJ SOLN
0.5000 mg | INTRAMUSCULAR | Status: DC | PRN
  Administered 2020-05-26 (×2): 1 mg via INTRAVENOUS
  Filled 2020-05-26 (×2): qty 1

## 2020-05-26 NOTE — Progress Notes (Signed)
[facility] SURGICAL PROGRESS NOTE  Hospital Day(s): 1.   Post op day(s): 1 Day Post-Op.   Interval History:  Patient seen and examined no acute events or new complaints overnight.  Patient reports he still has pain in the right perineum at the debridement site No chil;ls, nausea, or emesis No leukocytosis, WBC 7.5K; afebrile BMP reassuring, hyperglycemia 198 o/w unremarkable  Cultures from OR pending Tolerating diet without issues No new complaints  Vital signs in last 24 hours: [min-max] current  Temp:  [97.8 F (36.6 C)-99.1 F (37.3 C)] 97.9 F (36.6 C) (06/09 0423) Pulse Rate:  [75-97] 75 (06/09 0423) Resp:  [16-20] 18 (06/09 0423) BP: (122-136)/(68-92) 124/80 (06/09 0423) SpO2:  [93 %-100 %] 97 % (06/09 0423)     Height: 6' (182.9 cm) Weight: 106.4 kg BMI (Calculated): 31.81   Intake/Output last 2 shifts:  06/08 0701 - 06/09 0700 In: 2195.6 [I.V.:1604.6; IV Piggyback:590.9] Out: 700 [Urine:700]   Physical Exam:  Constitutional: alert, cooperative and no distress  Respiratory: breathing non-labored at rest  Cardiovascular: regular rate and sinus rhythm  Integumentary: Excision site to right perineum lateral to the scrotum with healthy granular tissue in the base, no further necrosis or purulence appreciable   Labs:  CBC Latest Ref Rng & Units 05/26/2020 05/25/2020 05/24/2020  WBC 4.0 - 10.5 K/uL 7.5 8.8 11.7(H)  Hemoglobin 13.0 - 17.0 g/dL 10.1(L) 10.0(L) 11.9(L)  Hematocrit 39.0 - 52.0 % 33.2(L) 32.3(L) 38.1(L)  Platelets 150 - 400 K/uL 214 199 196   CMP Latest Ref Rng & Units 05/26/2020 05/25/2020 05/24/2020  Glucose 70 - 99 mg/dL 160(F) 093(A) 355(D)  BUN 6 - 20 mg/dL 7 11 16   Creatinine 0.61 - 1.24 mg/dL 3.22 0.25  Sodium 135 - 145 mmol/L 138 133(L) 135  Potassium 3.5 - 5.1 mmol/L 3.5 3.0(L) 4.1  Chloride 98 - 111 mmol/L 106 101 99  CO2 22 - 32 mmol/L 26 24 22   Calcium 8.9 - 10.3 mg/dL 8.3(L) 8.4(L) 8.8(L)  Total Protein 6.5 - 8.1 g/dL - - 7.4   Total Bilirubin 0.3 - 1.2 mg/dL - - 2.8(H)  Alkaline Phos 38 - 126 U/L - - 77  AST 15 - 41 U/L - - 14(L)  ALT 0 - 44 U/L - - 18     Imaging studies: No new pertinent imaging studies   Assessment/Plan:  44 y.o. male overall doing well 1 Day Post-Op s/p I&D for necrotizing infection of the right perineum extending the scrotum, complicated by pertinent comorbidities including uncontrolled DM.   - No further debridement at this time  - Continue IV Abx; Switched to Zosyn & Vancomycin; follow up Cx in OR  - Continue dressing changes BID; wet-to-dry packing with Kerlix gauze, cover ABD, mesh underwear   - Multimodal pain control; prn medications for dressing changes    - Further management with primary team; we will follow   All of the above findings and recommendations were discussed with the patient, and the medical team, and all of patient's questions were answered to his expressed satisfaction.  -- , PA-C  [facility] 05/26/2020, 7:31 AM [phone] M-F: 7am - 4pm

## 2020-05-26 NOTE — Progress Notes (Signed)
Inpatient Diabetes Program Recommendations  AACE/ADA: New Consensus Statement on Inpatient Glycemic Control   Target Ranges:  Prepandial:   less than 140 mg/dL      Peak postprandial:   less than 180 mg/dL (1-2 hours)      Critically ill patients:  140 - 180 mg/dL   Results for AARIK, BLANK (MRN [id number]) as of 05/26/2020 09:02  Ref. Range 05/25/2020 07:48 05/25/2020 11:51 05/25/2020 19:33 05/25/2020 23:46 05/26/2020 04:57 05/26/2020 08:02  Glucose-Capillary Latest Ref Range: 70 - 99 mg/dL 893 (H) 734 (H) 287 (H) 270 (H) 188 (H) 152 (H)   Review of Glycemic Control  Diabetes history: DM2 Outpatient Diabetes medications: Glipizide XL 2.5 mg daily, Metformin 1000 mg BID Current orders for Inpatient glycemic control: Lantus 20 units daily, Novolog 0-9 units Q4H  Inpatient Diabetes Program Recommendations:   Insulin - Basal: Please consider increasing Lantus to 25 units daily.  Thanks, Orlando Penner, RN, MSN, CDE Diabetes Coordinator Inpatient Diabetes Program [phone] (Team Pager from 8am to 5pm)

## 2020-05-26 NOTE — Progress Notes (Signed)
Per Attending MD and surgical PA okay to discontinue contact precautions. No MRSA in the wound.

## 2020-05-26 NOTE — TOC Initial Note (Signed)
Transition of Care [facility]) - Initial/Assessment Note    Patient Details  Name: Roger Pennington MRN: [id number] Date of Birth: [birth year]  [facility]) CM/SW Contact:    Chapman Fitch, RN Phone Number: 05/26/2020, 1:06 PM  Clinical Narrative:                  Patient admitted from home with perineal abscess.  Patient states that he recently moved to [facility]  His PCP is at [facility]  Patient states that he has been employed for 2.5 months, and his insurance should take effect at 3 months.  States that he has already received a copy of his insurance card.   Patient obtains his medications at [facility].  Patient states that he is able to afford all of his medications except for the Trajenta.  I have notified the DM coordinator, and MD which medications Medication Management  Have in stock and would be able to assist with.   Patient states that he has a glucometer and testing supplies.  Patient is aware that he will have BID dressing changes, and nursing staff will provide educations  Patient confirms that he has transportation to get to appointments  RNCM following for medication needs  Expected Discharge Plan: Home/Self Care Barriers to Discharge: Inadequate or no insurance, Continued Medical Work up   Patient Goals and CMS Choice Patient states their goals for this hospitalization and ongoing recovery are:: to get home and back to work      Expected Discharge Plan and Services Expected Discharge Plan: Home/Self Care   Discharge Planning Services: CM Consult, Medication Assistance   Living arrangements for the past 2 months: Single Family Home                                      Prior Living Arrangements/Services Living arrangements for the past 2 months: Single Family Home   Patient language and need for interpreter reviewed:: Yes        Need for Family Participation in Patient Care: No (Comment)     Criminal Activity/Legal  Involvement Pertinent to Current Situation/Hospitalization: No - Comment as needed  Activities of Daily Living Home Assistive Devices/Equipment: None ADL Screening (condition at time of admission) Patient's cognitive ability adequate to safely complete daily activities?: Yes Is the patient deaf or have difficulty hearing?: No Does the patient have difficulty seeing, even when wearing glasses/contacts?: No Does the patient have difficulty concentrating, remembering, or making decisions?: No Patient able to express need for assistance with ADLs?: Yes Does the patient have difficulty dressing or bathing?: No Independently performs ADLs?: Yes (appropriate for developmental age) Does the patient have difficulty walking or climbing stairs?: No Weakness of Legs: None Weakness of Arms/Hands: None  Permission Sought/Granted                  Emotional Assessment       Orientation: : Oriented to Self, Oriented to Place, Oriented to  Time, Oriented to Situation   Psych Involvement: No (comment)  Admission diagnosis:  Peritoneal abscess (HCC) [K65.1] Abscess [L02.91] Sepsis without acute organ dysfunction, due to unspecified organism [facility]) [A41.9] Perineal abscess [L02.215] Patient Active Problem List   Diagnosis Date Noted  . Perineal abscess and cellulitis 05/24/2020  . Sepsis (HCC) 05/24/2020  . Diabetes mellitus without complication (HCC) 05/24/2020  . Chest pain 05/24/2020  . Lower abdominal pain 05/24/2020  .  Severe recurrent major depression without psychotic features ([place]) 06/05/2019  . Suicide attempt ([place]) 06/05/2019  . Essential hypertension 06/05/2019  . Social anxiety disorder 06/05/2019  . Intentional acetaminophen overdose ([place]) 06/04/2019  . Depression with suicidal ideation 06/04/2019  . Severe recurrent major depression ([place]) 07/02/2017  . Tobacco use disorder 07/02/2017  . Cannabis use disorder, severe, dependence ([place]) 07/02/2017  . Upper GI bleed 02/08/2016    PCP:  [place], [place]:   [facility] [address] [place] [address] [place]) [place] [address] Phone: [phone] Fax: [phone]  [place], [place] [address] Phone: [phone] Fax: [phone]  [place], [place] - [address] [place] [address] Phone: [phone] Fax: [phone]  [place] - [address] [place] [address] Phone: [phone] Fax: [phone]     Social Determinants of Health (SDOH) Interventions    Readmission Risk Interventions No flowsheet data found.

## 2020-05-26 NOTE — Progress Notes (Signed)
[place] [facility]   PATIENT NAME: Roger Pennington    MR#:  [id number]  DATE OF BIRTH:  07-Dec-1976  SUBJECTIVE:  CHIEF COMPLAINT:   Chief Complaint  Patient presents with   Generalized Body Aches  pain improved in the perineal area s/p I & D REVIEW OF SYSTEMS:  Review of Systems  Constitutional: Positive for malaise/fatigue. Negative for diaphoresis, fever and weight loss.  HENT: Negative for ear discharge, ear pain, hearing loss, nosebleeds, sore throat and tinnitus.   Eyes: Negative for blurred vision and pain.  Respiratory: Negative for cough, hemoptysis, shortness of breath and wheezing.   Cardiovascular: Negative for chest pain, palpitations, orthopnea and leg swelling.  Gastrointestinal: Negative for abdominal pain, blood in stool, constipation, diarrhea, heartburn, nausea and vomiting.  Genitourinary: Negative for dysuria, frequency and urgency.  Musculoskeletal: Negative for back pain and myalgias.  Skin: Negative for itching and rash.       Right perineal area abscess  Neurological: Negative for dizziness, tingling, tremors, focal weakness, seizures, weakness and headaches.  Psychiatric/Behavioral: Negative for depression. The patient is not nervous/anxious.    DRUG ALLERGIES:  No Known Allergies VITALS:  Blood pressure (!) 153/79, pulse 74, temperature 97.7 F (36.5 C), temperature source Oral, resp. rate 18, height 6' (1.829 m), weight 106.4 kg, SpO2 100 %. PHYSICAL EXAMINATION:  Physical Exam HENT:     Head: Normocephalic and atraumatic.  Eyes:     Conjunctiva/sclera: Conjunctivae normal.     Pupils: Pupils are equal, round, and reactive to light.  Neck:     Thyroid: No thyromegaly.     Trachea: No tracheal deviation.  Cardiovascular:     Rate and Rhythm: Normal rate and regular rhythm.     Heart sounds: Normal heart sounds.  Pulmonary:     Effort: Pulmonary effort is normal. No respiratory distress.     Breath sounds: Normal breath sounds.  No wheezing.  Chest:     Chest wall: No tenderness.  Abdominal:     General: Bowel sounds are normal. There is no distension.     Palpations: Abdomen is soft.     Tenderness: There is no abdominal tenderness.  Genitourinary:    Epididymis:     Left: Not enlarged ( ).       Comments: Healthy granular tissue s/p I & D - Dressing in place Musculoskeletal:        General: Normal range of motion.     Cervical back: Normal range of motion and neck supple.  Skin:    General: Skin is warm and dry.     Findings: No rash.  Neurological:     Mental Status: He is alert and oriented to person, place, and time.     Cranial Nerves: No cranial nerve deficit.    LABORATORY PANEL:  Male CBC Recent Labs  Lab 05/26/20 0423  WBC 7.5  HGB 10.1*  HCT 33.2*  PLT 214   ------------------------------------------------------------------------------------------------------------------ Chemistries  Recent Labs  Lab 05/24/20 0955 05/24/20 0955 05/25/20 0017 05/25/20 0017 05/26/20 0423  NA 135   < > 133*   < > 138  K 4.1   < > 3.0*   < > 3.5  CL 99   < > 101   < > 106  CO2 22   < > 24   < > 26  GLUCOSE 300*   < > 241*   < > 198*  BUN 16   < > 11   < >  7  CREATININE 1.12   < > 0.85   < > 0.87  CALCIUM 8.8*   < > 8.4*   < > 8.3*  MG  --   --  1.8  --   --   AST 14*  --   --   --   --   ALT 18  --   --   --   --   ALKPHOS 77  --   --   --   --   BILITOT 2.8*  --   --   --   --    < > = values in this interval not displayed.   RADIOLOGY:  No results found. ASSESSMENT AND PLAN:   Sepsis due to perineal abscess and cellulitis:  Present on admission - leukocytosis, fever, tachycardia.  - Currently hemodynamically stable - full thickness wound opening in the perineum; lateral right side of the scrotum -Appreciate wound care input - s/p I&D by surgery on 6/8 - Surgery started IV Zosyn & Vancomycin - Continue dressing changes BID; wet-to-dry packing with Kerlix gauze, cover ABD, mesh  underwear         Severe recurrent major depression ([place]): no SI or HI -Prozac  Hypokalemia Replete and recheck  Tobacco use disorder -Nicotine patch  HTN:  -Continue home medications: Amlodipine -hydralazine prn  Diabetes mellitus without complication ([place]): Most recent A1c not on record. Patient is taking Metformin and glipizide at home.  Blood sugar 300 - Increase insulin Lantus 25 units daily -SSI  Chest pain:  Noncardiac, His chest pain is positional. Now resolved - aspirin - UDS + for marijuana - 2d echo normal limit  Lower abdominal pain:  Likely due to to the perineal abscess.  Lipase normal.  CT scan of abdomen/pelvis is not impressive. -Pain control as above   Status is: Inpatient  Remains inpatient appropriate because:Ongoing diagnostic testing needed not appropriate for outpatient work up   Dispo: The patient is from: Home              Anticipated d/c is to: Home              Anticipated d/c date is: 2 days              Patient currently is not medically stable to d/c.  He may need repeat surgery/or visit tomorrow or Friday per surgeon       DVT prophylaxis: Heparin Family Communication: Discussed with patient   All the records are reviewed and case discussed with Care Management/Social Worker. Management plans discussed with the patient, nursing and they are in agreement.  CODE STATUS: Full Code  TOTAL TIME TAKING CARE OF THIS PATIENT: 35 minutes.   More than 50% of the time was spent in counseling/coordination of care: YES  POSSIBLE D/C IN 1-2 DAYS, DEPENDING ON CLINICAL CONDITION.   Max Sane M.D on 05/26/2020 at 1:55 PM  [facility]   CC: Primary care physician; [facility], [place]  Note: This dictation was prepared with Diplomatic Services operational officer dictation along with smaller phrase technology. Any transcriptional errors that result from this process are unintentional.

## 2020-05-27 LAB — BASIC METABOLIC PANEL
Anion gap: 4 — ABNORMAL LOW (ref 5–15)
BUN: 6 mg/dL (ref 6–20)
CO2: 29 mmol/L (ref 22–32)
Calcium: 8.4 mg/dL — ABNORMAL LOW (ref 8.9–10.3)
Chloride: 106 mmol/L (ref 98–111)
Creatinine, Ser: 0.82 mg/dL (ref 0.61–1.24)
GFR calc Af Amer: >60 mL/min (ref >60)
GFR calc non Af Amer: >60 mL/min (ref >60)
Glucose, Bld: 130 mg/dL — ABNORMAL HIGH (ref 70–99)
Potassium: 3.8 mmol/L (ref 3.5–5.1)
Sodium: 139 mmol/L (ref 135–145)

## 2020-05-27 LAB — GLUCOSE, CAPILLARY
Glucose-Capillary: 114 mg/dL — ABNORMAL HIGH (ref 70–99)
Glucose-Capillary: 143 mg/dL — ABNORMAL HIGH (ref 70–99)
Glucose-Capillary: 298 mg/dL — ABNORMAL HIGH (ref 70–99)
Glucose-Capillary: 140 mg/dL — ABNORMAL HIGH (ref 70–99)
Glucose-Capillary: 226 mg/dL — ABNORMAL HIGH (ref 70–99)
Glucose-Capillary: 214 mg/dL — ABNORMAL HIGH (ref 70–99)

## 2020-05-27 LAB — CBC
HCT: 30.2 % — ABNORMAL LOW (ref 39.0–52.0)
Hemoglobin: 9.6 g/dL — ABNORMAL LOW (ref 13.0–17.0)
MCH: 24.9 pg — ABNORMAL LOW (ref 26.0–34.0)
MCHC: 31.8 g/dL (ref 30.0–36.0)
MCV: 78.2 fL — ABNORMAL LOW (ref 80.0–100.0)
Platelets: 218 10*3/uL (ref 150–400)
RBC: 3.86 MIL/uL — ABNORMAL LOW (ref 4.22–5.81)
RDW: 12.5 % (ref 11.5–15.5)
WBC: 5.3 10*3/uL (ref 4.0–10.5)
nRBC: 0 % (ref 0.0–0.2)

## 2020-05-27 NOTE — Progress Notes (Signed)
[facility] Day(s): 2.   Post op day(s): 2 Days Post-Op.   Interval History:  Patient seen and examined no acute events or new complaints overnight.  Patient reports he had a rough night after dressing change with second shift No fever, chills No leukocytosis, WBC 5.2K BMP reassuring, hyperglycemia improved He was able tolerate dressing change yesterday Cultures reviewed: GNR & GPCs in clusters No new complaints.   Vital signs in last 24 hours: [min-max] current  Temp:  [97.5 F (36.4 C)-98 F (36.7 C)] 97.7 F (36.5 C) (06/10 0457) Pulse Rate:  [70-77] 70 (06/10 0457) Resp:  [16-20] 16 (06/10 0457) BP: (128-160)/(79-100) 131/91 (06/10 0457) SpO2:  [99 %-100 %] 100 % (06/10 0457)     Height: 6' (182.9 cm) Weight: 106.4 kg BMI (Calculated): 31.81   Intake/Output last 2 shifts:  06/09 0701 - 06/10 0700 In: 1348.9 [P.O.:118; I.V.:963.4; IV Piggyback:267.5] Out: 800 [Urine:800]   Physical Exam:  Constitutional: alert, cooperative and no distress  Respiratory: breathing non-labored at rest  Cardiovascular: regular rate and sinus rhythm  Integumentary: Excision site to right perineum lateral to the scrotum with healthy granular tissue in the base, no further necrosis or purulence appreciable   Labs:  CBC Latest Ref Rng & Units 05/27/2020 05/26/2020 05/25/2020  WBC 4.0 - 10.5 K/uL 5.3 7.5 8.8  Hemoglobin 13.0 - 17.0 g/dL 9.6(L) 10.1(L) 10.0(L)  Hematocrit 39 - 52 % 30.2(L) 33.2(L) 32.3(L)  Platelets 150 - 400 K/uL 218 214 199   CMP Latest Ref Rng & Units 05/27/2020 05/26/2020 05/25/2020  Glucose 70 - 99 mg/dL 130(H) 198(H) 241(H)  BUN 6 - 20 mg/dL 6 7 11   Creatinine 0.61 - 1.24 mg/dL 0.82 0.87 0.85  Sodium 135 - 145 mmol/L 139 138 133(L)  Potassium 3.5 - 5.1 mmol/L 3.8 3.5 3.0(L)  Chloride 98 - 111 mmol/L 106 106 101  CO2 22 - 32 mmol/L 29 26 24   Calcium 8.9 - 10.3 mg/dL 8.4(L) 8.3(L) 8.4(L)  Total Protein 6.5 - 8.1 g/dL - - -   Total Bilirubin 0.3 - 1.2 mg/dL - - -  Alkaline Phos 38 - 126 U/L - - -  AST 15 - 41 U/L - - -  ALT 0 - 44 U/L - - -     Imaging studies: No new pertinent imaging studies   Assessment/Plan:  44 y.o. male overall doing well 2 Days Post-Op s/p I&D for necrotizing infection of the right perineum extending the scrotum, complicated by pertinent comorbidities including uncontrolled DM.   - No further debridement at this time             - Continue IV Abx; Switched to Zosyn & Vancomycin; follow up Cx in OR; narrow when available             - Continue dressing changes BID; wet-to-dry packing with Kerlix gauze, cover ABD, mesh underwear              - Multimodal pain control; prn medications for dressing changes               - Further management with primary team; we will follow    - Discharge Planning: Awaiting culture results to tailor Abx for home, I will place home health orders to try to get him help with dressing changes at home, hopefully home in 24-48 hours  All of the above findings and recommendations were discussed with the patient, and the medical team, and all  of patient's questions were answered to his expressed satisfaction.  -- Lynden Oxford, PA-C [facility] 05/27/2020, 7:39 AM [phone] M-F: 7am - 4pm

## 2020-05-27 NOTE — Progress Notes (Signed)
[place] at [facility]   PATIENT NAME: Roger Pennington    MR#:  [id number]  DATE OF BIRTH:  11-17-1976  SUBJECTIVE:  CHIEF COMPLAINT:   Chief Complaint  Patient presents with  . Generalized Body Aches  Reporting a lot of discomfort and pain around the dressing, mention that dressing change in the second shift yesterday and he was unable to sleep due to pain REVIEW OF SYSTEMS:  Review of Systems  Constitutional: Positive for malaise/fatigue. Negative for diaphoresis, fever and weight loss.  HENT: Negative for ear discharge, ear pain, hearing loss, nosebleeds, sore throat and tinnitus.   Eyes: Negative for blurred vision and pain.  Respiratory: Negative for cough, hemoptysis, shortness of breath and wheezing.   Cardiovascular: Negative for chest pain, palpitations, orthopnea and leg swelling.  Gastrointestinal: Negative for abdominal pain, blood in stool, constipation, diarrhea, heartburn, nausea and vomiting.  Genitourinary: Negative for dysuria, frequency and urgency.  Musculoskeletal: Negative for back pain and myalgias.  Skin: Negative for itching and rash.       Right perineal area pain  Neurological: Negative for dizziness, tingling, tremors, focal weakness, seizures, weakness and headaches.  Psychiatric/Behavioral: Negative for depression. The patient is not nervous/anxious.    DRUG ALLERGIES:  No Known Allergies VITALS:  Blood pressure 132/76, pulse 70, temperature (!) 97.5 F (36.4 C), resp. rate 12, height 6' (1.829 m), weight 106.4 kg, SpO2 100 %. PHYSICAL EXAMINATION:  Physical Exam HENT:     Head: Normocephalic and atraumatic.  Eyes:     Conjunctiva/sclera: Conjunctivae normal.     Pupils: Pupils are equal, round, and reactive to light.  Neck:     Thyroid: No thyromegaly.     Trachea: No tracheal deviation.  Cardiovascular:     Rate and Rhythm: Normal rate and regular rhythm.     Heart sounds: Normal heart sounds.  Pulmonary:     Effort: Pulmonary  effort is normal. No respiratory distress.     Breath sounds: Normal breath sounds. No wheezing.  Chest:     Chest wall: No tenderness.  Abdominal:     General: Bowel sounds are normal. There is no distension.     Palpations: Abdomen is soft.     Tenderness: There is no abdominal tenderness.  Genitourinary:    Epididymis:     Left: Not enlarged ( ).       Comments: Healthy granular tissue s/p I & D - Dressing in place Musculoskeletal:        General: Normal range of motion.     Cervical back: Normal range of motion and neck supple.  Skin:    General: Skin is warm and dry.     Findings: No rash.  Neurological:     Mental Status: He is alert and oriented to person, place, and time.     Cranial Nerves: No cranial nerve deficit.    LABORATORY PANEL:  Male CBC Recent Labs  Lab 05/27/20 0541  WBC 5.3  HGB 9.6*  HCT 30.2*  PLT 218   ------------------------------------------------------------------------------------------------------------------ Chemistries  Recent Labs  Lab 05/24/20 0955 05/24/20 0955 05/25/20 0017 05/26/20 0423 05/27/20 0541  NA 135   < > 133*   < > 139  K 4.1   < > 3.0*   < > 3.8  CL 99   < > 101   < > 106  CO2 22   < > 24   < > 29  GLUCOSE 300*   < > 241*   < >  130*  BUN 16   < > 11   < > 6  CREATININE 1.12   < > 0.85   < > 0.82  CALCIUM 8.8*   < > 8.4*   < > 8.4*  MG  --   --  1.8  --   --   AST 14*  --   --   --   --   ALT 18  --   --   --   --   ALKPHOS 77  --   --   --   --   BILITOT 2.8*  --   --   --   --    < > = values in this interval not displayed.   RADIOLOGY:  No results found. ASSESSMENT AND PLAN:   Sepsis due to perineal abscess and cellulitis:  Present on admission - leukocytosis, fever, tachycardia.  - Currently hemodynamically stable - full thickness wound opening in the perineum; lateral right side of the scrotum - s/p I&D by surgery on 6/8 -Continue IV Zosyn & Vancomycin, can narrow antibiotics per wound culture  sensitivity - Continue dressing changes BID; wet-to-dry packing with Kerlix gauze, cover ABD, mesh underwear  -Discussed with surgery Zach to look at his dressing's to address his concerns about pain and or dressing        Severe recurrent major depression ([place]): no SI or HI -Prozac  Hypokalemia Repleted and resolved  Tobacco use disorder -Nicotine patch  HTN:  -Continue home medications: Amlodipine -hydralazine prn  Diabetes mellitus without complication ([place]): Most recent A1c not on record. Patient is taking Metformin and glipizide at home.  Blood sugar 300 - Increase insulin Lantus 25 units daily -SSI  Chest pain:  Noncardiac, His chest pain is positional. Now resolved - aspirin - UDS + for marijuana - 2d echo normal limit    Status is: Inpatient  Remains inpatient appropriate because:Ongoing diagnostic testing needed not appropriate for outpatient work up   Dispo: The patient is from: Home              Anticipated d/c is to: Home              Anticipated d/c date is: 1 day              Patient currently is not medically stable to d/c.      DVT prophylaxis: Heparin Family Communication: Discussed with patient   All the records are reviewed and case discussed with Care Management/Social Worker. Management plans discussed with the patient, nursing and they are in agreement.  CODE STATUS: Full Code  TOTAL TIME TAKING CARE OF THIS PATIENT: 35 minutes.   More than 50% of the time was spent in counseling/coordination of care: YES  POSSIBLE D/C IN 1 DAYS, DEPENDING ON CLINICAL CONDITION.   Max Sane M.D on 05/27/2020 at 2:09 PM  [facility]   CC: Primary care physician; Center, [place]  Note: This dictation was prepared with Diplomatic Services operational officer dictation along with smaller phrase technology. Any transcriptional errors that result from this process are unintentional.

## 2020-05-27 NOTE — Progress Notes (Signed)
Inpatient Diabetes Program Recommendations  AACE/ADA: New Consensus Statement on Inpatient Glycemic Control (2015)  Target Ranges:  Prepandial:   less than 140 mg/dL      Peak postprandial:   less than 180 mg/dL (1-2 hours)      Critically ill patients:  140 - 180 mg/dL   Lab Results  Component Value Date   GLUCAP 298 (H) 05/27/2020   HGBA1C 10.4 (H) 05/24/2020    Review of Glycemic Control  Diabetes history: DM2 Outpatient Diabetes medications: Glipizide XL 2.5 mg daily, Metformin 1000 mg BID Current orders for Inpatient glycemic control: Lantus 25 units daily, Novolog 0-9 units Q4H  Inpatient Diabetes Program Recommendations:   Novolog 0-9 units TID with meals  Novolog 0-5 QHS Novolog 2 units TID with meals if eats at least 50% of meal and CBG >80 mg/dl  Will continue to follow while inpatient.  Thank you, Dulce Sellar, RN, BSN Diabetes Coordinator Inpatient Diabetes Program [phone] (team pager from 8a-5p)

## 2020-05-28 DIAGNOSIS — L0291 Cutaneous abscess, unspecified: Secondary | ICD-10-CM

## 2020-05-28 LAB — CBC
HCT: 33.7 % — ABNORMAL LOW (ref 39.0–52.0)
Hemoglobin: 10.7 g/dL — ABNORMAL LOW (ref 13.0–17.0)
MCH: 24.7 pg — ABNORMAL LOW (ref 26.0–34.0)
MCHC: 31.8 g/dL (ref 30.0–36.0)
MCV: 77.8 fL — ABNORMAL LOW (ref 80.0–100.0)
Platelets: 266 10*3/uL (ref 150–400)
RBC: 4.33 MIL/uL (ref 4.22–5.81)
RDW: 12.4 % (ref 11.5–15.5)
WBC: 5.4 10*3/uL (ref 4.0–10.5)
nRBC: 0 % (ref 0.0–0.2)

## 2020-05-28 LAB — GLUCOSE, CAPILLARY
Glucose-Capillary: 143 mg/dL — ABNORMAL HIGH (ref 70–99)
Glucose-Capillary: 165 mg/dL — ABNORMAL HIGH (ref 70–99)
Glucose-Capillary: 230 mg/dL — ABNORMAL HIGH (ref 70–99)
Glucose-Capillary: 239 mg/dL — ABNORMAL HIGH (ref 70–99)

## 2020-05-28 LAB — BASIC METABOLIC PANEL
Anion gap: 8 (ref 5–15)
BUN: 8 mg/dL (ref 6–20)
CO2: 26 mmol/L (ref 22–32)
Calcium: 8.5 mg/dL — ABNORMAL LOW (ref 8.9–10.3)
Chloride: 103 mmol/L (ref 98–111)
Creatinine, Ser: 0.82 mg/dL (ref 0.61–1.24)
GFR calc Af Amer: >60 mL/min (ref >60)
GFR calc non Af Amer: >60 mL/min (ref >60)
Glucose, Bld: 181 mg/dL — ABNORMAL HIGH (ref 70–99)
Potassium: 3.8 mmol/L (ref 3.5–5.1)
Sodium: 137 mmol/L (ref 135–145)

## 2020-05-28 LAB — SURGICAL PATHOLOGY

## 2020-05-28 MED ORDER — IBUPROFEN 400 MG PO TABS: 400.0000 mg | ORAL_TABLET | Freq: Four times a day (QID) | ORAL | 0 refills | Status: DC | PRN

## 2020-05-28 MED ORDER — OXYCODONE-ACETAMINOPHEN 5-325 MG PO TABS: 1.0000 | ORAL_TABLET | ORAL | 0 refills | Status: DC | PRN

## 2020-05-28 MED ORDER — LIVING WELL WITH DIABETES BOOK
Freq: Once | Status: AC
  Filled 2020-05-28: qty 1

## 2020-05-28 MED ORDER — AMOXICILLIN-POT CLAVULANATE 875-125 MG PO TABS
1.0000 | ORAL_TABLET | Freq: Two times a day (BID) | ORAL | 0 refills | Status: DC
Start: 2020-05-28 — End: 2020-05-28

## 2020-05-28 MED ORDER — AMOXICILLIN-POT CLAVULANATE 875-125 MG PO TABS
1.0000 | ORAL_TABLET | Freq: Two times a day (BID) | ORAL | 0 refills | Status: AC
Start: 2020-05-28 — End: 2020-06-07

## 2020-05-28 MED ORDER — INSULIN PEN NEEDLE 32G X 4 MM MISC
90.0000 [IU] | Freq: Three times a day (TID) | 10 refills | Status: DC
Start: 2020-05-28 — End: 2023-12-18

## 2020-05-28 MED ORDER — BASAGLAR KWIKPEN 100 UNIT/ML ~~LOC~~ SOPN
25.0000 [IU] | PEN_INJECTOR | Freq: Every day | SUBCUTANEOUS | 0 refills | Status: DC
Start: 2020-05-28 — End: 2022-02-09

## 2020-05-28 NOTE — Progress Notes (Signed)
Met w/ pt at bedside today.  Told me he has been taking his Metformin but has not been taking the Glipizide.  Told me the ED MD gave him a Rx for Glipizide about 2 weeks ago but he never picked it up from the pharmacy.  Cannot afford the Tradjenta that was prescribed several months ago.  Pt told me he is happy to see his CBGs have been much better controlled on insuln in here in the hospital.  Is willing to take insulin as an outpatient.  Discussed w/ pt what Lantus (Basagalr) insulin is, how it works, when to take, etc.  Discussed w/ pt that he will be able to get the long-acting insulin at the medication management clinic for low cost.  Educated patient and spouse on insulin pen use at home.  Reviewed all steps of insulin pen including attachment of needle, 2-unit air shot, dialing up dose, giving injection, rotation of injection sites, removing needle, disposal of sharps, storage of unused insulin, disposal of insulin etc.  Patient able to provide successful return demonstration.  Reviewed troubleshooting with insulin pen.  Also reviewed Signs/Symptoms of Hypoglycemia with patient and how to treat Hypoglycemia at home.    Dr. Manuella Ghazi to give pt Rxs for Basaglar insulin pens, Insulin pen needles at time of d/c.     --Will follow patient during hospitalization--  Wyn Quaker RN, MSN, CDE Diabetes Coordinator Inpatient Glycemic Control Team Team Pager: [phone] (8a-5p)

## 2020-05-28 NOTE — Progress Notes (Addendum)
[facility] Hospital Day(s): 3.   Post op day(s): 3 Days Post-Op.   Interval History:  Patient seen and examined no acute events or new complaints overnight.  Patient reports he is feeling much better, tolerated dressing changes No fever, chills, nausea, or emesis Labs are reassuring, no leukocytosis Cultures are growing streptococcus gallolyticus; susceptibilities pending Tolerated dressing changes, unfortunately can not get home health Mobilizing  Vital signs in last 24 hours: [min-max] current  Temp:  [97.5 F (36.4 C)-98.3 F (36.8 C)] 97.7 F (36.5 C) (06/11 0434) Pulse Rate:  [67-76] 67 (06/11 0434) Resp:  [12-18] 18 (06/11 0434) BP: (113-132)/(76-85) 113/85 (06/11 0434) SpO2:  [99 %-100 %] 100 % (06/11 0434)     Height: 6' (182.9 cm) Weight: 106.4 kg BMI (Calculated): 31.81   Intake/Output last 2 shifts:  06/10 0701 - 06/11 0700 In: 4587.8 [P.O.:480; I.V.:2968.7; IV Piggyback:1139] Out: -    Physical Exam:  Constitutional: alert, cooperative and no distress  Respiratory: breathing non-labored at rest  Cardiovascular: regular rate and sinus rhythm  Integumentary: Excision site to right perineum lateral to the scrotum with healthy granular tissue in the base, no further necrosis or purulence appreciable   Labs:  CBC Latest Ref Rng & Units 05/28/2020 05/27/2020 05/26/2020  WBC 4.0 - 10.5 K/uL 5.4 5.3 7.5  Hemoglobin 13.0 - 17.0 g/dL 10.7(L) 9.6(L) 10.1(L)  Hematocrit 39 - 52 % 33.7(L) 30.2(L) 33.2(L)  Platelets 150 - 400 K/uL 266 218 214   CMP Latest Ref Rng & Units 05/28/2020 05/27/2020 05/26/2020  Glucose 70 - 99 mg/dL 181(H) 130(H) 198(H)  BUN 6 - 20 mg/dL 8 6 7   Creatinine 0.61 - 1.24 mg/dL 0.82 0.82 0.87  Sodium 135 - 145 mmol/L 137 139 138  Potassium 3.5 - 5.1 mmol/L 3.8 3.8 3.5  Chloride 98 - 111 mmol/L 103 106 106  CO2 22 - 32 mmol/L 26 29 26   Calcium 8.9 - 10.3 mg/dL 8.5(L) 8.4(L) 8.3(L)  Total Protein 6.5 - 8.1 g/dL  - - -  Total Bilirubin 0.3 - 1.2 mg/dL - - -  Alkaline Phos 38 - 126 U/L - - -  AST 15 - 41 U/L - - -  ALT 0 - 44 U/L - - -     Imaging studies: No new pertinent imaging studies   Assessment/Plan:  44 y.o. male doing well 3 Days Post-Op s/p I&D for necrotizing infection of the right perineum extending the scrotum, complicated by pertinent comorbidities including uncontrolled DM.   - No further debridement at this time             - Continue IV Abx; Switched to Zosyn & Vancomycin; follow up Cx in OR; transition to PO for home at least 14 days total             - Continue dressing changes BID; wet-to-dry packing with Kerlix gauze, cover ABD, mesh underwear. Will need educated on this for home --> I will do a dressing change this afternoon with family             - Multimodal pain control; prn medications for dressing changes               - Further management with primary team   - Discharge Planning: Stable for discharge home from surgical standpoint, PO ABx for home x14 days total, pain Rx for dressing changes, follow up with general surgery in 1-2 weeks  All of the above findings and  recommendations were discussed with the patient, and the medical team, and all of patient's questions were answered to his expressed satisfaction.  -- Lynden Oxford, PA-C [facility] 05/28/2020, 7:29 AM [phone] M-F: 7am - 4pm  I saw and evaluated the patient.  I agree with the above documentation, exam, and plan, which I have edited where appropriate. Duanne Guess  10:32 AM

## 2020-05-28 NOTE — Discharge Instructions (Signed)
Skin Abscess  A skin abscess is an infected area of your skin that contains pus and other material. An abscess can happen in any part of your body. Some abscesses break open (rupture) on their own. Most continue to get worse unless they are treated. The infection can spread deeper into the body and into your blood, which can make you feel sick. A skin abscess is caused by germs that enter the skin through a cut or scrape. It can also be caused by blocked oil and sweat glands or infected hair follicles. This condition is usually treated by:  Draining the pus.  Taking antibiotic medicines.  Placing a warm, wet washcloth over the abscess. Follow these instructions at home: Medicines   Take over-the-counter and prescription medicines only as told by your doctor.  If you were prescribed an antibiotic medicine, take it as told by your doctor. Do not stop taking the antibiotic even if you start to feel better. Abscess care   If you have an abscess that has not drained, place a warm, clean, wet washcloth over the abscess several times a day. Do this as told by your doctor.  Follow instructions from your doctor about how to take care of your abscess. Make sure you: ? Cover the abscess with a bandage (dressing). ? Change your bandage or gauze as told by your doctor. ? Wash your hands with soap and water before you change the bandage or gauze. If you cannot use soap and water, use hand sanitizer.  Check your abscess every day for signs that the infection is getting worse. Check for: ? More redness, swelling, or pain. ? More fluid or blood. ? Warmth. ? More pus or a bad smell. General instructions  To avoid spreading the infection: ? Do not share personal care items, towels, or hot tubs with others. ? Avoid making skin-to-skin contact with other people.  Keep all follow-up visits as told by your doctor. This is important. Contact a doctor if:  You have more redness, swelling, or pain  around your abscess.  You have more fluid or blood coming from your abscess.  Your abscess feels warm when you touch it.  You have more pus or a bad smell coming from your abscess.  You have a fever.  Your muscles ache.  You have chills.  You feel sick. Get help right away if:  You have very bad (severe) pain.  You see red streaks on your skin spreading away from the abscess. Summary  A skin abscess is an infected area of your skin that contains pus and other material.  The abscess is caused by germs that enter the skin through a cut or scrape. It can also be caused by blocked oil and sweat glands or infected hair follicles.  Follow your doctor's instructions on caring for your abscess, taking medicines, preventing infections, and keeping follow-up visits. This information is not intended to replace advice given to you by your health care provider. Make sure you discuss any questions you have with your health care provider. Document Revised: 07/10/2019 Document Reviewed: 01/17/2018 Elsevier Patient Education  2020 [employer]  Incision and Drainage, Care After This sheet gives you information about how to care for yourself after your procedure. Your health care provider may also give you more specific instructions. If you have problems or questions, contact your health care provider. What can I expect after the procedure? After the procedure, it is common to have:  Pain or discomfort around the  incision site.  Blood, fluid, or pus (drainage) from the incision.  Redness and firm skin around the incision site. Follow these instructions at home: Medicines  Take over-the-counter and prescription medicines only as told by your health care provider.  If you were prescribed an antibiotic medicine, use or take it as told by your health care provider. Do not stop using the antibiotic even if you start to feel better. Wound care Follow instructions from your health care  provider about how to take care of your wound. Make sure you:  Wash your hands with soap and water before and after you change your bandage (dressing). If soap and water are not available, use hand sanitizer.  Change your dressing and packing as told by your health care provider. ? If your dressing is dry or stuck when you try to remove it, moisten or wet the dressing with saline or water so that it can be removed without harming your skin or tissues. ? If your wound is packed, leave it in place until your health care provider tells you to remove it. To remove the packing, moisten or wet the packing with saline or water so that it can be removed without harming your skin or tissues.  Leave stitches (sutures), skin glue, or adhesive strips in place. These skin closures may need to stay in place for 2 weeks or longer. If adhesive strip edges start to loosen and curl up, you may trim the loose edges. Do not remove adhesive strips completely unless your health care provider tells you to do that. Check your wound every day for signs of infection. Check for:  More redness, swelling, or pain.  More fluid or blood.  Warmth.  Pus or a bad smell. If you were sent home with a drain tube in place, follow instructions from your health care provider about:  How to empty it.  How to care for it at home.  General instructions  Rest the affected area.  Do not take baths, swim, or use a hot tub until your health care provider approves. Ask your health care provider if you may take showers. You may only be allowed to take sponge baths.  Return to your normal activities as told by your health care provider. Ask your health care provider what activities are safe for you. Your health care provider may put you on activity or lifting restrictions.  The incision will continue to drain. It is normal to have some clear or slightly bloody drainage. The amount of drainage should lessen each day.  Do not apply  any creams, ointments, or liquids unless you have been told to by your health care provider.  Keep all follow-up visits as told by your health care provider. This is important. Contact a health care provider if:  Your cyst or abscess returns.  You have a fever or chills.  You have more redness, swelling, or pain around your incision.  You have more fluid or blood coming from your incision.  Your incision feels warm to the touch.  You have pus or a bad smell coming from your incision.  You have red streaks above or below the incision site. Get help right away if:  You have severe pain or bleeding.  You cannot eat or drink without vomiting.  You have decreased urine output.  You become short of breath.  You have chest pain.  You cough up blood.  The affected area becomes numb or starts to tingle. These symptoms may  represent a serious problem that is an emergency. Do not wait to see if the symptoms will go away. Get medical help right away. Call your local emergency services (911 in the [place] Do not drive yourself to the hospital. Summary  After this procedure, it is common to have fluid, blood, or pus coming from the surgery site.  Follow all home care instructions. You will be told how to take care of your incision, how to check for infection, and how to take medicines.  If you were prescribed an antibiotic medicine, take it as told by your health care provider. Do not stop taking the antibiotic even if you start to feel better.  Contact a health care provider if you have increased redness, swelling, or pain around your incision. Get help right away if you have chest pain, you vomit, you cough up blood, or you have shortness of breath.  Keep all follow-up visits as told by your health care provider. This is important. This information is not intended to replace advice given to you by your health care provider. Make sure you discuss any questions you have with your health  care provider. Document Revised: 11/04/2018 Document Reviewed: 11/04/2018 Elsevier Patient Education  2020 [employer]   Insulin Injection Instructions, Using Insulin Pens, Adult A subcutaneous injection is a shot of medicine that is injected into the layer of fat and tissue between skin and muscle. People with type 1 diabetes must take insulin because their bodies do not make it. People with type 2 diabetes may need to take insulin. There are many different types of insulin. The type of insulin that you take may determine how many injections you give yourself and when you need to give the injections. Supplies needed:  Soap and water to wash hands.  Your insulin pen.  A new, unused needle.  Alcohol wipes.  A disposal container that is meant for sharp items (sharps container), such as an empty plastic bottle with a cover. How to choose a site for injection  The body absorbs insulin differently, depending on where the insulin is injected (injection site). It is best to inject insulin into the same body area each time (for example, always in the abdomen), but you should use a different spot in that area for each injection. Do not inject the insulin in the same spot each time. There are five main areas that can be used for injecting. These areas include:  Abdomen. This is the preferred area.  Front of thigh.  Upper, outer side of thigh.  Upper, outer side of arm.  Upper, outer part of buttock. How to use an insulin pen   First, follow the steps for Get ready, then continue with the steps for Inject the insulin. Get ready 1. Wash your hands with soap and water. If soap and water are not available, use hand sanitizer. 2. Before you give yourself an insulin injection, be sure to test your blood sugar level (blood glucose level) and write down that number. Follow any instructions from your health care provider about what to do if your blood glucose level is higher or lower than your  normal range. 3. Check the expiration date and the type of insulin that is in the pen. 4. If you are using CLEAR insulin, check to see that it is clear and free of clumps. 5. If you are using CLOUDY insulin, do not shake the pen to get the injection ready. Instead, get it ready in one of these  ways: ? Gently roll the pen between your palms several times. ? Tip the pen up and down several times. 6. Remove the cap from the insulin pen. 7. Use an alcohol wipe to clean the rubber tip of the pen. 8. Remove the protective paper tab from the disposable needle. Do not let the needle touch anything. 9. Screw a new, unused needle onto the pen. 10. Remove the outer plastic needle cover. Do not throw away the outer plastic cover yet. ? If the pen uses a special safety needle, leave the inner needle shield in place. ? If the pen does not use a special safety needle, remove the inner plastic cover from the needle. 11. Follow the manufacturer's instructions to prime the insulin pen with the volume of insulin needed. Hold the pen with the needle pointing up, and push the button on the opposite end of the pen until a drop of insulin appears at the needle tip. If no insulin appears, repeat this step. 12. Turn the button (dial) to the number of units of insulin that you will be injecting. Inject the insulin 1. Use an alcohol wipe to clean the site where you will be injecting the needle. Let the site air-dry. 2. Hold the pen in the palm of your writing hand like a pencil. 3. If directed by your health care provider, use your other hand to pinch and hold about an inch (2.5 cm) of skin at the injection site. Do not directly touch the cleaned part of the skin. 4. Gently but quickly, use your writing hand to put the needle straight into the skin. The needle should be at a 90-degree angle (perpendicular) to the skin. 5. When the needle is completely inserted into the skin, use your thumb or index finger of your writing  hand to push the top button of the pen down all the way to inject the insulin. 6. Let go of the skin that you are pinching. Continue to hold the pen in place with your writing hand. 7. Wait 10 seconds, then pull the needle straight out of the skin. This will allow all of the insulin to go from the pen and needle into your body. 8. Carefully put the larger (outer) plastic cover of the needle back over the needle, then unscrew the capped needle and discard it in a sharps container, such as an empty plastic bottle with a cover. 9. Put the plastic cap back on the insulin pen. How to throw away supplies  Discard all used needles in a puncture-proof sharps disposal container. You can ask your local pharmacy about where you can get this kind of disposal container, or you can use an empty plastic liquid laundry detergent bottle that has a cover.  Follow the disposal regulations for the area where you live. Do not use any needle more than one time.  Throw away empty disposable pens in the regular trash. Questions to ask your health care provider  How often should I be taking insulin?  How often should I check my blood glucose?  What amount of insulin should I be taking at each time?  What are the side effects?  What should I do if my blood glucose is too high?  What should I do if my blood glucose is too low?  What should I do if I forget to take my insulin?  What number should I call if I have questions? Where to find more information  American Diabetes Association (ADA): www.diabetes.org  American Association of Diabetes Educators (AADE) Patient Resources: https://www.diabeteseducator.org Summary  A subcutaneous injection is a shot of medicine that is injected into the layer of fat and tissue between skin and muscle.  Before you give yourself an insulin injection, be sure to test your blood sugar level (blood glucose level) and write down that number.  Check the expiration date and  the type of insulin that is in the pen. The type of insulin that you take may determine how many injections you give yourself and when you need to give the injections.  It is best to inject insulin into the same body area each time (for example, always in the abdomen), but you should use a different spot in that area for each injection. This information is not intended to replace advice given to you by your health care provider. Make sure you discuss any questions you have with your health care provider. Document Revised: 12/24/2017 Document Reviewed: 01/07/2016 Elsevier Patient Education  2020 [employer].

## 2020-05-28 NOTE — TOC Transition Note (Signed)
Transition of Care [facility]) - CM/SW Discharge Note   Patient Details  Name: Roger Pennington MRN: [id number] Date of Birth: 1976/03/19  [facility] CM/SW Contact:  Chapman Fitch, RN Phone Number: 05/28/2020, 2:10 PM   Clinical Narrative:    Patient to discharge home today Patient and significant other have been educated on dressing changes  prescriptions for basaglar, ibuprofen, Augmentin faxed to [facility] .  Patient to pick up after discharge.  RNCM confirmed with [facility]  That they have the medications in stock       Barriers to Discharge: Inadequate or no insurance, Continued Medical Work up   Patient Goals and CMS Choice Patient states their goals for this hospitalization and ongoing recovery are:: to get home and back to work      Discharge Placement                       Discharge Plan and Services   Discharge Planning Services: CM Consult, Medication Assistance                                 Social Determinants of Health (SDOH) Interventions     Readmission Risk Interventions No flowsheet data found.

## 2020-05-28 NOTE — Progress Notes (Signed)
Roger Pennington A and O x4. VSS. Pt tolerating diet well. No complaints of nausea or vomiting. IV removed intact, prescriptions given. Pt voices understanding of discharge instructions with no further questions. Patient discharged via wheelchair with SN  Allergies as of 05/28/2020   No Known Allergies     Medication List    STOP taking these medications   glipiZIDE 2.5 MG 24 hr tablet Commonly known as: GLUCOTROL XL     TAKE these medications   amLODipine 5 MG tablet Commonly known as: NORVASC Take 5 mg by mouth daily.   amoxicillin-clavulanate 875-125 MG tablet Commonly known as: Augmentin Take 1 tablet by mouth every 12 (twelve) hours for 10 days.   Basaglar KwikPen 100 UNIT/ML Inject 0.25 mLs (25 Units total) into the skin daily.   FLUoxetine 20 MG capsule Commonly known as: PROZAC Take 1 capsule (20 mg total) by mouth daily.   ibuprofen 400 MG tablet Commonly known as: ADVIL Take 1 tablet (400 mg total) by mouth every 6 (six) hours as needed.   Insulin Pen Needle 32G X 4 MM Misc 90 Units by Does not apply route 3 (three) times daily.   metFORMIN 1000 MG tablet Commonly known as: GLUCOPHAGE Take 1,000 mg by mouth 2 (two) times daily with a meal.   oxyCODONE-acetaminophen 5-325 MG tablet Commonly known as: PERCOCET/ROXICET Take 1 tablet by mouth every 4 (four) hours as needed for severe pain (30 minutes prior to dressing changes at home).            Discharge Care Instructions  (From admission, onward)         Start     Ordered   05/28/20 0000  Discharge wound care:       Comments: wet-to-dry packing with Kerlix gauze, cover ABD, mesh underwear   05/28/20 1113   05/28/20 0000  Change dressing (specify)       Comments: Dressing change: wet-to-dry packing with Kerlix gauze, cover ABD, mesh underwear every other day   05/28/20 1113          Vitals:   05/28/20 0845 05/28/20 1151  BP: (!) 133/91 132/87  Pulse:  74  Resp:    Temp:  97.9 F (36.6  C)  SpO2:  99%    Roger Pennington

## 2020-05-29 LAB — AEROBIC/ANAEROBIC CULTURE W GRAM STAIN (SURGICAL/DEEP WOUND)

## 2020-05-29 LAB — CULTURE, BLOOD (ROUTINE X 2)
Culture: NO GROWTH
Culture: NO GROWTH
Special Requests: ADEQUATE
Special Requests: ADEQUATE

## 2020-05-30 NOTE — Discharge Summary (Signed)
[facility] NAME: Roger Pennington    MR#:  [id number]  DATE OF BIRTH:  1976-11-13  DATE OF ADMISSION:  05/24/2020   ADMITTING PHYSICIAN: Max Sane, MD  DATE OF DISCHARGE: 05/28/2020  2:29 PM  PRIMARY CARE PHYSICIAN: [facility] [place] DIAGNOSIS:  Peritoneal abscess ([place]) [K65.1] Abscess [L02.91] Sepsis without acute organ dysfunction, due to unspecified organism [facility]) [A41.9] Perineal abscess [L02.215] DISCHARGE DIAGNOSIS:  Principal Problem:   Perineal abscess and cellulitis Active Problems:   Severe recurrent major depression ([place])   Tobacco use disorder   Essential hypertension   Sepsis ([place])   Diabetes mellitus without complication ([place])   Chest pain   Lower abdominal pain   Abscess  SECONDARY DIAGNOSIS:   Past Medical History:  Diagnosis Date  . Diabetes mellitus without complication ([place])   . Hypertension   . Tobacco abuse    HOSPITAL COURSE:  44 y.o. male  admittedfor necrotizing infection of the right perineum extending the scrotum, complicated by uncontrolled DM.  Sepsis due to necrotizing infection of the right perineum and scrotum with cellulitis: Present on admission - leukocytosis, fever, tachycardia.   Necrotizing infection of the right perineum extending to the scrotum with cellulitis - doing well 3 Days Post-Op s/p I&D on 6/8 - No further debridement at this time -Treated with IV antibiotics while in the hospital in being discharged on oral antibiotic to finish total 14 days course - Continue dressing changes BID; wet-to-dry packing with Kerlix gauze, cover ABD, mesh underwear. Will need educated on this for home --> surgeon has gone over dressing change with family on the day of discharge           Severe recurrent major depression ([place]): no SI or HI -Prozac  Hypokalemia Repleted and resolved  Tobacco use disorder -Counseled  HTN:  -Continue  Amlodipine  Diabetes mellitus without complication (HCC):Patient is agreeable to take insulin.  I have given Rxs for Basaglar insulin pens, Insulin pen needles at time of d/c.  Chest pain: Noncardiac,His chest pain is positional.Now resolved - UDS + for marijuana - 2d echo normal limit   DISCHARGE CONDITIONS:  Stable CONSULTS OBTAINED:  Treatment Team:  Jules Husbands, MD DRUG ALLERGIES:  No Known Allergies DISCHARGE MEDICATIONS:   Allergies as of 05/28/2020   No Known Allergies     Medication List    STOP taking these medications   glipiZIDE 2.5 MG 24 hr tablet Commonly known as: GLUCOTROL XL     TAKE these medications   amLODipine 5 MG tablet Commonly known as: NORVASC Take 5 mg by mouth daily.   amoxicillin-clavulanate 875-125 MG tablet Commonly known as: Augmentin Take 1 tablet by mouth every 12 (twelve) hours for 10 days.   Basaglar KwikPen 100 UNIT/ML Inject 0.25 mLs (25 Units total) into the skin daily.   FLUoxetine 20 MG capsule Commonly known as: PROZAC Take 1 capsule (20 mg total) by mouth daily.   ibuprofen 400 MG tablet Commonly known as: ADVIL Take 1 tablet (400 mg total) by mouth every 6 (six) hours as needed.   Insulin Pen Needle 32G X 4 MM Misc 90 Units by Does not apply route 3 (three) times daily.   metFORMIN 1000 MG tablet Commonly known as: GLUCOPHAGE Take 1,000 mg by mouth 2 (two) times daily with a meal.   oxyCODONE-acetaminophen 5-325 MG tablet Commonly known as: PERCOCET/ROXICET Take 1 tablet by  mouth every 4 (four) hours as needed for severe pain (30 minutes prior to dressing changes at home).            Discharge Care Instructions  (From admission, onward)         Start     Ordered   05/28/20 0000  Discharge wound care:       Comments: wet-to-dry packing with Kerlix gauze, cover ABD, mesh underwear   05/28/20 1113   05/28/20 0000  Change dressing (specify)       Comments: Dressing change: wet-to-dry packing with  Kerlix gauze, cover ABD, mesh underwear every other day   05/28/20 1113         DISCHARGE INSTRUCTIONS:  - Continue dressing changes BID; wet-to-dry packing with Kerlix gauze, cover ABD, mesh underwear. Will need educated on this for home --> surgeon has gone over dressing change with family on the day of discharge DIET:  Regular diet DISCHARGE CONDITION:  Good ACTIVITY:  Activity as tolerated OXYGEN:  Home Oxygen: No.  Oxygen Delivery: room air DISCHARGE LOCATION:  home   If you experience worsening of your admission symptoms, develop shortness of breath, life threatening emergency, suicidal or homicidal thoughts you must seek medical attention immediately by calling 911 or calling your MD immediately  if symptoms less severe.  You Must read complete instructions/literature along with all the possible adverse reactions/side effects for all the Medicines you take and that have been prescribed to you. Take any new Medicines after you have completely understood and accpet all the possible adverse reactions/side effects.   Please note  You were cared for by a hospitalist during your hospital stay. If you have any questions about your discharge medications or the care you received while you were in the hospital after you are discharged, you can call the unit and asked to speak with the hospitalist on call if the hospitalist that took care of you is not available. Once you are discharged, your primary care physician will handle any further medical issues. Please note that NO REFILLS for any discharge medications will be authorized once you are discharged, as it is imperative that you return to your primary care physician (or establish a relationship with a primary care physician if you do not have one) for your aftercare needs so that they can reassess your need for medications and monitor your lab values.    On the day of Discharge:  VITAL SIGNS:  Blood pressure 132/87, pulse 74,  temperature 97.9 F (36.6 C), temperature source Oral, resp. rate 18, height 6' (1.829 m), weight 106.4 kg, SpO2 99 %. PHYSICAL EXAMINATION:  GENERAL:  44 y.o.-year-old patient lying in the bed with no acute distress.  EYES: Pupils equal, round, reactive to light and accommodation. No scleral icterus. Extraocular muscles intact.  HEENT: Head atraumatic, normocephalic. Oropharynx and nasopharynx clear.  NECK:  Supple, no jugular venous distention. No thyroid enlargement, no tenderness.  LUNGS: Normal breath sounds bilaterally, no wheezing, rales,rhonchi or crepitation. No use of accessory muscles of respiration.  CARDIOVASCULAR: S1, S2 normal. No murmurs, rubs, or gallops.  ABDOMEN: Soft, non-tender, non-distended. Bowel sounds present. No organomegaly or mass.  EXTREMITIES: No pedal edema, cyanosis, or clubbing.  NEUROLOGIC: Cranial nerves II through XII are intact. Muscle strength 5/5 in all extremities. Sensation intact. Gait not checked.  PSYCHIATRIC: The patient is alert and oriented x 3.  SKIN: No obvious rash, lesion, or ulcer.  DATA REVIEW:   CBC Recent Labs  Lab 05/28/20 0415  WBC 5.4  HGB 10.7*  HCT 33.7*  PLT 266    Chemistries  Recent Labs  Lab 05/24/20 0955 05/24/20 0955 05/25/20 0017 05/26/20 0423 05/28/20 0415  NA 135   < > 133*   < > 137  K 4.1   < > 3.0*   < > 3.8  CL 99   < > 101   < > 103  CO2 22   < > 24   < > 26  GLUCOSE 300*   < > 241*   < > 181*  BUN 16   < > 11   < > 8  CREATININE 1.12   < > 0.85   < > 0.82  CALCIUM 8.8*   < > 8.4*   < > 8.5*  MG  --   --  1.8  --   --   AST 14*  --   --   --   --   ALT 18  --   --   --   --   ALKPHOS 77  --   --   --   --   BILITOT 2.8*  --   --   --   --    < > = values in this interval not displayed.     Outpatient follow-up  Follow-up Information    Donovan Kail, PA-C. Go on 06/11/2020.   Specialty: Physician Assistant Why: 10:30am appointment Contact information: [address]  [address] [address] [phone]        [facility]. Schedule an appointment as soon as possible for a visit on 06/18/2020.   Specialty: General Practice Why: 8:20am appointment Contact information: [address] [place] [address] [phone]                Management plans discussed with the patient, family and they are in agreement.  CODE STATUS: Prior   TOTAL TIME TAKING CARE OF THIS PATIENT: 45 minutes.    Delfino Lovett M.D on 05/30/2020 at 1:59 PM  [facility]   CC: Primary care physician; [facility]   Note: This dictation was prepared with Nurse, children's dictation along with smaller phrase technology. Any transcriptional errors that result from this process are unintentional.

## 2020-06-11 ENCOUNTER — Other Ambulatory Visit: Payer: Self-pay

## 2020-06-11 ENCOUNTER — Encounter: Payer: Self-pay | Admitting: Physician Assistant

## 2020-06-11 ENCOUNTER — Ambulatory Visit (INDEPENDENT_AMBULATORY_CARE_PROVIDER_SITE_OTHER): Payer: Self-pay | Admitting: Physician Assistant

## 2020-06-11 VITALS — BP 147/93 | HR 84 | Temp 97.9°F | Ht 72.0 in | Wt 242.0 lb

## 2020-06-11 DIAGNOSIS — L02215 Cutaneous abscess of perineum: Secondary | ICD-10-CM

## 2020-06-11 DIAGNOSIS — Z09 Encounter for follow-up examination after completed treatment for conditions other than malignant neoplasm: Secondary | ICD-10-CM

## 2020-06-11 NOTE — Patient Instructions (Addendum)
Laqueta Due, PA recommends [place] and [place] to help with irritation in the area. Patient advised to continue with applying dry gauze over the packing area. Patient will follow up in two weeks.  Wound Care, Adult Taking care of your wound properly can help to prevent pain, infection, and scarring. It can also help your wound to heal more quickly. How to care for your wound Wound care      Follow instructions from your health care provider about how to take care of your wound. Make sure you: ? Wash your hands with soap and water before you change the bandage (dressing). If soap and water are not available, use hand sanitizer. ? Change your dressing as told by your health care provider. ? Leave stitches (sutures), skin glue, or adhesive strips in place. These skin closures may need to stay in place for 2 weeks or longer. If adhesive strip edges start to loosen and curl up, you may trim the loose edges. Do not remove adhesive strips completely unless your health care provider tells you to do that.  Check your wound area every day for signs of infection. Check for: ? Redness, swelling, or pain. ? Fluid or blood. ? Warmth. ? Pus or a bad smell.  Ask your health care provider if you should clean the wound with mild soap and water. Doing this may include: ? Using a clean towel to pat the wound dry after cleaning it. Do not rub or scrub the wound. ? Applying a cream or ointment. Do this only as told by your health care provider. ? Covering the incision with a clean dressing.  Ask your health care provider when you can leave the wound uncovered.  Keep the dressing dry until your health care provider says it can be removed. Do not take baths, swim, use a hot tub, or do anything that would put the wound underwater until your health care provider approves. Ask your health care provider if you can take showers. You may only be allowed to take sponge baths. Medicines   If you were prescribed  an antibiotic medicine, cream, or ointment, take or use the antibiotic as told by your health care provider. Do not stop taking or using the antibiotic even if your condition improves.  Take over-the-counter and prescription medicines only as told by your health care provider. If you were prescribed pain medicine, take it 30 or more minutes before you do any wound care or as told by your health care provider. General instructions  Return to your normal activities as told by your health care provider. Ask your health care provider what activities are safe.  Do not scratch or pick at the wound.  Do not use any products that contain nicotine or tobacco, such as cigarettes and e-cigarettes. These may delay wound healing. If you need help quitting, ask your health care provider.  Keep all follow-up visits as told by your health care provider. This is important.  Eat a diet that includes protein, vitamin A, vitamin C, and other nutrient-rich foods to help the wound heal. ? Foods rich in protein include meat, dairy, beans, nuts, and other sources. ? Foods rich in vitamin A include carrots and dark green, leafy vegetables. ? Foods rich in vitamin C include citrus, tomatoes, and other fruits and vegetables. ? Nutrient-rich foods have protein, carbohydrates, fat, vitamins, or minerals. Eat a variety of healthy foods including vegetables, fruits, and whole grains. Contact a health care provider if:  You received  a tetanus shot and you have swelling, severe pain, redness, or bleeding at the injection site.  Your pain is not controlled with medicine.  You have redness, swelling, or pain around the wound.  You have fluid or blood coming from the wound.  Your wound feels warm to the touch.  You have pus or a bad smell coming from the wound.  You have a fever or chills.  You are nauseous or you vomit.  You are dizzy. Get help right away if:  You have a red streak going away from your  wound.  The edges of the wound open up and separate.  Your wound is bleeding, and the bleeding does not stop with gentle pressure.  You have a rash.  You faint.  You have trouble breathing. Summary  Always wash your hands with soap and water before changing your bandage (dressing).  To help with healing, eat foods that are rich in protein, vitamin A, vitamin C, and other nutrients.  Check your wound every day for signs of infection. Contact your health care provider if you suspect that your wound is infected. This information is not intended to replace advice given to you by your health care provider. Make sure you discuss any questions you have with your health care provider. Document Revised: 03/24/2019 Document Reviewed: 06/20/2016 Elsevier Patient Education  2020 [employer].

## 2020-06-11 NOTE — Progress Notes (Signed)
[facility] POST-OP OFFICE VISIT  06/11/2020  HPI: Roger Pennington is a 44 y.o. male 17 days s/p incision and debridement of right perineal abscess with Dr Everlene Farrier.   Overall doing better He is tolerating dressing changes at home better and using less gauze, some serosanguinous drainage No fever, chills No issues with Abx Better glycemic control No other complaints.   Vital signs: BP (!) 147/93   Pulse 84   Temp 97.9 F (36.6 C) (Temporal)   Ht 6' (1.829 m)   Wt 242 lb (109.8 kg)   SpO2 98%   BMI 32.82 kg/m    Physical Exam: Constitutional: Well appearing male, NAD Skin: Excisional debridement wound to the right perineum is healing well, entire wound bed is healthy granulation tissue, no surrounding erythema, the distal portion is mildly indurated, no evidence of recurrence   Assessment/Plan: This is a 44 y.o. male 17 days s/p incision and debridement of right perineal abscess    - I changed his dressing at bedside, tolerating well  - Continue packing  - Reviewed wound care  - Follow up in 2 weeks for re-check  -- Lynden Oxford, PA-C [facility] 06/11/2020, 10:08 AM [phone] M-F: 7am - 4pm

## 2020-06-14 ENCOUNTER — Telehealth: Payer: Self-pay | Admitting: *Deleted

## 2020-06-14 NOTE — Telephone Encounter (Signed)
Patient called and wants a return back to work tomorrow note faxed to [phone]

## 2020-06-15 NOTE — Telephone Encounter (Signed)
Return to work written for return on 06/15/20. This has been faxed to the patient's employer.

## 2020-06-24 ENCOUNTER — Encounter: Payer: Self-pay | Admitting: Physician Assistant

## 2020-06-24 ENCOUNTER — Ambulatory Visit (INDEPENDENT_AMBULATORY_CARE_PROVIDER_SITE_OTHER): Payer: Self-pay | Admitting: Physician Assistant

## 2020-06-24 ENCOUNTER — Other Ambulatory Visit: Payer: Self-pay

## 2020-06-24 VITALS — BP 136/93 | HR 71 | Temp 98.3°F | Resp 12 | Ht 72.0 in | Wt 248.0 lb

## 2020-06-24 DIAGNOSIS — L0291 Cutaneous abscess, unspecified: Secondary | ICD-10-CM

## 2020-06-24 DIAGNOSIS — Z09 Encounter for follow-up examination after completed treatment for conditions other than malignant neoplasm: Secondary | ICD-10-CM

## 2020-06-24 DIAGNOSIS — L02215 Cutaneous abscess of perineum: Secondary | ICD-10-CM

## 2020-06-24 MED ORDER — IBUPROFEN 400 MG PO TABS: 400.0000 mg | ORAL_TABLET | Freq: Four times a day (QID) | ORAL | 0 refills | Status: DC | PRN

## 2020-06-24 NOTE — Progress Notes (Signed)
[facility] POST-OP OFFICE VISIT  06/24/2020  HPI: Roger Pennington is a 44 y.o. male 1 month s/pincision and debridement of right perineal abscess with Dr Everlene Farrier  From his perineal wound perspective he is healing well. He is no longer requiring any packing to the area. No fever, chills, pain  However, he reports that before the holiday weekend he noticed that he was getting more "boils." He reports that these started suddenly. There was one on his buttock that went away but he has a painful one on his left anterior mid-thigh and a smaller less painful one on his right shin. He was seen by his PCP for this on 07/02 and started on Bactrim but he never filled this. He had been trying warm compresses and soaks with minimal improvement. The area on his left thigh has continued to swell and become extremely painful. Again no fevers or chills at home. He believes these are related to his metformin.   Vital signs: BP (!) 136/93   Pulse 71   Temp 98.3 F (36.8 C) (Oral)   Resp 12   Ht 6' (1.829 m)   Wt 248 lb (112.5 kg)   SpO2 96%   BMI 33.63 kg/m    Physical Exam: Constitutional: Well appearing male, NAD Skin: Excisional debridement wound to the right perineum is well healed, there is some fibrinous discharge but the wound bed is nearly healed and granulation tissue is present throughout. No erythema. He has a small 0.5 cm indurated nodule to the right lateral shin, this is mildly tender, no erythema, no fluctuance. He has a second larger 1 x 1 cm indurated and extremely tender nodule to the left anterior thigh, this has a central scab, I am unable to appreciate fluctuance.    PROCEDURE:   Procedure: Incision and Drainage, Left Thigh Abscess  Indication: left thigh Abscess  Preforming Provider: Lynden Oxford, PA-C  Details: I discussed the risks, benefits, and alternatives to the above procedure with the patient and informed consent was obtained. The left thigh was  prepped and draped in standard sterile fashion. 9 ccs of 1% lidocaine with epinephrine was injected intradermally and adequate anesthesia was obtained. Using an 11 blade knife a cruciate incision was made over the area of concern and I was able to express a minimal amount of sero-purulent drainage. A culture of this was obtained. I then used a hemostat to break up a small loculation. The wound was then irrigated with 25 ccs of normal saline and packed with 1/2 inch iodoform gauze and covered with gauze and tape. The patient tolerated this well and all sharps were accounted for and disposed of properly.   Findings: Minimal seropurulent drainage, cultured  Estimate Blood Lose; Minimal   Complications: None    Assessment/Plan: This is a 44 y.o. male 1 month s/pincision and debridement of right perineal abscess   - I did preform I&D of left thigh abscess and details are described above  - I reviewed wound care for this new I&D site including daily packing with 1/2 inch iodoform guaze, and he understands. He will continue with dry gauze in his right perineum.  - He will pick up his Bactrim prescription and complete this as prescribed x10 days   - RTC in 1 week  -- Lynden Oxford, PA-C [facility] 06/24/2020, 12:31 PM [phone] M-F: 7am - 4pm

## 2020-06-24 NOTE — Patient Instructions (Signed)
Continue with the warm compresses. Start the medication today.

## 2020-06-29 LAB — ANAEROBIC AND AEROBIC CULTURE

## 2020-07-02 ENCOUNTER — Other Ambulatory Visit: Payer: Self-pay

## 2020-07-02 ENCOUNTER — Ambulatory Visit (INDEPENDENT_AMBULATORY_CARE_PROVIDER_SITE_OTHER): Payer: Self-pay | Admitting: Physician Assistant

## 2020-07-02 ENCOUNTER — Encounter: Payer: Self-pay | Admitting: Physician Assistant

## 2020-07-02 VITALS — BP 142/94 | HR 77 | Temp 98.2°F | Resp 12 | Ht 72.0 in | Wt 244.0 lb

## 2020-07-02 DIAGNOSIS — Z09 Encounter for follow-up examination after completed treatment for conditions other than malignant neoplasm: Secondary | ICD-10-CM

## 2020-07-02 DIAGNOSIS — L0291 Cutaneous abscess, unspecified: Secondary | ICD-10-CM

## 2020-07-02 MED ORDER — OXYCODONE HCL 5 MG PO TABS: 5.0000 mg | ORAL_TABLET | Freq: Four times a day (QID) | ORAL | 0 refills | Status: DC | PRN

## 2020-07-02 NOTE — Patient Instructions (Signed)
Continue to wash the wounds with soap and water. Continue to place a small amount of packing material into the wound. Please pick up your medication at the pharmacy.

## 2020-07-02 NOTE — Progress Notes (Signed)
[facility] POST-OP OFFICE VISIT  07/02/2020  HPI: Roger Pennington is a 44 y.o. male 8 days s/p incision and drainage of left anterior thigh abscess which grew out staph. He is finishing his course of bactrim. Overall doing well. Pain is his biggest issue with dressing changes. No fever or chills. No other complaints.   Vital signs: BP (!) 142/94   Pulse 77   Temp 98.2 F (36.8 C) (Oral)   Resp 12   Ht 6' (1.829 m)   Wt 244 lb (110.7 kg)   SpO2 98%   BMI 33.09 kg/m    Physical Exam: Constitutional: Well appearing male, NAD Skin: Excisional debridement wound to the right perineum is well healed now. Previous I&D site to the left anterior thigh, 1x1 cm, some fibrinous tissue in the wound bed which I debrided, no purulence, no erythema  Assessment/Plan: This is a 44 y.o. male 8 days s/p incision and drainage of left anterior thigh abscess   - Changed dressing today, continue packing with iodoform gauze  - No need for additional I&D  - Complete ABx  - I will add a few (~10 pills) of oxycodone for breakthrough pain/dressing changes; otherwise continue tylenol and ibuprofen  - rtc in 1 week for re-check   -- Lynden Oxford, PA-C [facility] 07/02/2020, 11:34 AM [phone] M-F: 7am - 4pm

## 2020-07-09 ENCOUNTER — Encounter: Payer: Self-pay | Admitting: Physician Assistant

## 2020-07-09 ENCOUNTER — Other Ambulatory Visit: Payer: Self-pay

## 2020-07-09 ENCOUNTER — Ambulatory Visit (INDEPENDENT_AMBULATORY_CARE_PROVIDER_SITE_OTHER): Payer: Self-pay | Admitting: Physician Assistant

## 2020-07-09 VITALS — BP 175/92 | HR 74 | Temp 98.3°F | Ht 72.0 in | Wt 251.0 lb

## 2020-07-09 DIAGNOSIS — L0291 Cutaneous abscess, unspecified: Secondary | ICD-10-CM

## 2020-07-09 DIAGNOSIS — Z09 Encounter for follow-up examination after completed treatment for conditions other than malignant neoplasm: Secondary | ICD-10-CM

## 2020-07-09 MED ORDER — BACTROBAN NASAL 2 % NA OINT: 1.0000 "application " | TOPICAL_OINTMENT | Freq: Two times a day (BID) | NASAL | 0 refills | Status: DC

## 2020-07-09 MED ORDER — SULFAMETHOXAZOLE-TRIMETHOPRIM 800-160 MG PO TABS
1.0000 | ORAL_TABLET | Freq: Two times a day (BID) | ORAL | 0 refills | Status: AC
Start: 2020-07-09 — End: 2020-07-23

## 2020-07-09 NOTE — Patient Instructions (Addendum)
You will need to restart antibiotics. We also are prescribing an ointment for inside of your nose. You will also wash with Chlorhexidine soap(pick this up over the counter at the pharmacy) once daily for 1 week. Then 2-3 times a week after that.   Follow up here in 2 weeks.   Call us if you develop any fevers or the lower leg wound worsens or needs to be lanced.

## 2020-07-09 NOTE — Progress Notes (Signed)
[facility] POST-OP OFFICE VISIT  07/09/2020  HPI: Roger Pennington is a 44 y.o. male 15 days s/p incision and drainage of left anterior thigh abscess which grew out staph  His left thigh wound is healing well, no erythema, drainage, or pain. However, he has a worsening area over his right lateral calf. This area is very tender and indurated. No drainage. No fever or chills. He does not want I&D of this.    Vital signs: BP (!) 175/92   Pulse 74   Temp 98.3 F (36.8 C)   Ht 6' (1.829 m)   Wt (!) 251 lb (113.9 kg)   SpO2 95%   BMI 34.04 kg/m    Physical Exam: Constitutional: Well appearing male, NAD Skin: His left anterior thigh wound is healing well, wound bed with granulation tissue. He has a significant area of induration and tenderness over the right lateral calf, I am unable to appreciate any fluctuance or crepitus.   Assessment/Plan: This is a 44 y.o. male 15 days s/p incision and drainage of left anterior thigh abscess   - I offered I&D of the right lateral calf, however, he denied and I think that is reasonable as there is no gross fluctuance  - I will restart Bactrim BID x14 days  - Recommend chlorhexidine  washes daily  - Mupirocin in the bilateral nares daily  - rtc in 2 weeks  -- Lynden Oxford, PA-C [facility] 07/09/2020, 10:35 AM [phone] M-F: 7am - 4pm

## 2020-07-12 ENCOUNTER — Emergency Department
Admission: EM | Admit: 2020-07-12 | Discharge: 2020-07-12 | Disposition: A | Discharge status: Home / Self Care | Payer: Self-pay | Attending: Emergency Medicine | Admitting: Emergency Medicine

## 2020-07-12 ENCOUNTER — Other Ambulatory Visit: Payer: Self-pay

## 2020-07-12 ENCOUNTER — Encounter: Payer: Self-pay | Admitting: Emergency Medicine

## 2020-07-12 DIAGNOSIS — E119 Type 2 diabetes mellitus without complications: Secondary | ICD-10-CM | POA: Insufficient documentation

## 2020-07-12 DIAGNOSIS — Z79899 Other long term (current) drug therapy: Secondary | ICD-10-CM | POA: Insufficient documentation

## 2020-07-12 DIAGNOSIS — L02419 Cutaneous abscess of limb, unspecified: Secondary | ICD-10-CM

## 2020-07-12 DIAGNOSIS — Z794 Long term (current) use of insulin: Secondary | ICD-10-CM | POA: Insufficient documentation

## 2020-07-12 DIAGNOSIS — L02415 Cutaneous abscess of right lower limb: Secondary | ICD-10-CM | POA: Insufficient documentation

## 2020-07-12 DIAGNOSIS — Z87891 Personal history of nicotine dependence: Secondary | ICD-10-CM | POA: Insufficient documentation

## 2020-07-12 DIAGNOSIS — I10 Essential (primary) hypertension: Secondary | ICD-10-CM | POA: Insufficient documentation

## 2020-07-12 LAB — COMPREHENSIVE METABOLIC PANEL
ALT: 17 U/L (ref 0–44)
AST: 17 U/L (ref 15–41)
Albumin: 3.9 g/dL (ref 3.5–5.0)
Alkaline Phosphatase: 68 U/L (ref 38–126)
Anion gap: 6 (ref 5–15)
BUN: 13 mg/dL (ref 6–20)
CO2: 26 mmol/L (ref 22–32)
Calcium: 8.7 mg/dL — ABNORMAL LOW (ref 8.9–10.3)
Chloride: 109 mmol/L (ref 98–111)
Creatinine, Ser: 1.2 mg/dL (ref 0.61–1.24)
GFR calc Af Amer: >60 mL/min (ref >60)
GFR calc non Af Amer: >60 mL/min (ref >60)
Glucose, Bld: 113 mg/dL — ABNORMAL HIGH (ref 70–99)
Potassium: 3.8 mmol/L (ref 3.5–5.1)
Sodium: 141 mmol/L (ref 135–145)
Total Bilirubin: 0.6 mg/dL (ref 0.3–1.2)
Total Protein: 7.2 g/dL (ref 6.5–8.1)

## 2020-07-12 LAB — CBC
HCT: 39.9 % (ref 39.0–52.0)
Hemoglobin: 12.2 g/dL — ABNORMAL LOW (ref 13.0–17.0)
MCH: 24.9 pg — ABNORMAL LOW (ref 26.0–34.0)
MCHC: 30.6 g/dL (ref 30.0–36.0)
MCV: 81.4 fL (ref 80.0–100.0)
Platelets: 253 10*3/uL (ref 150–400)
RBC: 4.9 MIL/uL (ref 4.22–5.81)
RDW: 13.3 % (ref 11.5–15.5)
WBC: 6.3 10*3/uL (ref 4.0–10.5)
nRBC: 0 % (ref 0.0–0.2)

## 2020-07-12 MED ORDER — LIDOCAINE-EPINEPHRINE 2 %-1:100000 IJ SOLN
20.0000 mL | Freq: Once | INTRAMUSCULAR | Status: AC
  Administered 2020-07-12: 20 mL
  Filled 2020-07-12: qty 1

## 2020-07-12 NOTE — ED Provider Notes (Signed)
[facility] Emergency Department Provider Note   ____________________________________________   First MD Initiated Contact with Patient 07/12/20 0805     (approximate)  I have reviewed the triage vital signs and the nursing notes.   HISTORY  Chief Complaint Leg Swelling    HPI Roger Pennington is a 44 y.o. male with past medical history of hypertension and diabetes who presents to the ED complaining of leg pain and swelling.  Patient reports that for about the past 2 weeks he has had a tender swollen area to his right lateral calf.  He has been following at the general surgery clinic for abscesses to his perineum and left thigh, was offered I&D of his right calf there 3 days ago, but declined.  He was started on a course of Bactrim 3 days ago but has had increasing pain and swelling to right calf since then.  He had significant pain when he went to bear weight on his right leg this morning and so decided to come to the ED.  He denies any fevers, chills, nausea, vomiting.  Symptoms are similar to prior abscesses.        Past Medical History:  Diagnosis Date  . Diabetes mellitus without complication (HCC)   . Hypertension   . Tobacco abuse     Patient Active Problem List   Diagnosis Date Noted  . Abscess   . Perineal abscess and cellulitis 05/24/2020  . Sepsis (HCC) 05/24/2020  . Diabetes mellitus without complication (HCC) 05/24/2020  . Chest pain 05/24/2020  . Lower abdominal pain 05/24/2020  . Severe recurrent major depression without psychotic features (HCC) 06/05/2019  . Suicide attempt (HCC) 06/05/2019  . Essential hypertension 06/05/2019  . Social anxiety disorder 06/05/2019  . Intentional acetaminophen overdose (HCC) 06/04/2019  . Depression with suicidal ideation 06/04/2019  . Severe recurrent major depression (HCC) 07/02/2017  . Tobacco use disorder 07/02/2017  . Cannabis use disorder, severe, dependence (HCC) 07/02/2017  . Upper GI  bleed 02/08/2016    Past Surgical History:  Procedure Laterality Date  . ESOPHAGOGASTRODUODENOSCOPY (EGD) WITH PROPOFOL N/A 02/09/2016   Procedure: ESOPHAGOGASTRODUODENOSCOPY (EGD) WITH PROPOFOL;  Surgeon: Christena Deem, MD;  Location: [facility] ENDOSCOPY;  Service: Endoscopy;  Laterality: N/A;  . INCISION AND DRAINAGE ABSCESS Right 05/25/2020   Procedure: INCISION AND DRAINAGE ABSCESS;  Surgeon: Leafy Ro, MD;  Location: [facility] ORS;  Service: General;  Laterality: Right;    Prior to Admission medications   Medication Sig Start Date End Date Taking? Authorizing Provider  amLODipine (NORVASC) 5 MG tablet Take 5 mg by mouth daily.    [provider]  FLUoxetine (PROZAC) 20 MG capsule Take 1 capsule (20 mg total) by mouth daily. 06/07/19   Clapacs, Jackquline Denmark, MD  ibuprofen (ADVIL) 400 MG tablet Take 1 tablet (400 mg total) by mouth every 6 (six) hours as needed. 06/24/20   Donovan Kail, PA-C  Insulin Glargine (BASAGLAR KWIKPEN) 100 UNIT/ML Inject 0.25 mLs (25 Units total) into the skin daily. 05/28/20 06/27/20  Delfino Lovett, MD  Insulin Pen Needle 32G X 4 MM MISC 90 Units by Does not apply route 3 (three) times daily. 05/28/20   Delfino Lovett, MD  Lactobacillus (ACIDOPHILUS PROBIOTIC) 10 MG CAPS take 1 tablet by mouth twice a day as needed for GI upset 06/02/20   [provider]  metFORMIN (GLUCOPHAGE) 1000 MG tablet Take 1,000 mg by mouth 2 (two) times daily with a meal.    [provider]  mupirocin nasal ointment (BACTROBAN NASAL) 2 % Place 1 application into the nose 2 (two) times daily. Use one-half of tube in each nostril twice daily for five (5) days. After application, press sides of nose together and gently massage. 07/09/20   Donovan Kail, PA-C  oxyCODONE (OXY IR/ROXICODONE) 5 MG immediate release tablet Take 1 tablet (5 mg total) by mouth every 6 (six) hours as needed for severe pain or breakthrough pain (dressing changes). 07/02/20   Donovan Kail, PA-C    sulfamethoxazole-trimethoprim (BACTRIM DS) 800-160 MG tablet Take 1 tablet by mouth 2 (two) times daily for 14 days. 07/09/20 07/23/20  Donovan Kail, PA-C  TRADJENTA 5 MG TABS tablet Take 5 mg by mouth daily. 05/01/20   [provider]    Allergies Patient has no known allergies.  Family History  Problem Relation Age of Onset  . Hypertension Father   . Hypertension Brother     Social History Social History   Tobacco Use  . Smoking status: Former Smoker    Types: Cigarettes    Quit date: 06/17/2020    Years since quitting: 0.0  . Smokeless tobacco: Never Used  Substance Use Topics  . Alcohol use: Yes    Alcohol/week: 0.0 standard drinks    Comment: occ  . Drug use: No    Review of Systems  Constitutional: No fever/chills Eyes: No visual changes. ENT: No sore throat. Cardiovascular: Denies chest pain. Respiratory: Denies shortness of breath. Gastrointestinal: No abdominal pain.  No nausea, no vomiting.  No diarrhea.  No constipation. Genitourinary: Negative for dysuria. Musculoskeletal: Negative for back pain.  Positive for right leg pain and swelling. Skin: Positive for rash. Neurological: Negative for headaches, focal weakness or numbness.  ____________________________________________   PHYSICAL EXAM:  VITAL SIGNS: ED Triage Vitals  Enc Vitals Group     BP 07/12/20 0505 (!) 154/95     Pulse Rate 07/12/20 0505 74     Resp 07/12/20 0505 18     Temp 07/12/20 0505 98 F (36.7 C)     Temp Source 07/12/20 0505 Oral     SpO2 07/12/20 0505 99 %     Weight 07/12/20 0506 (!) 251 lb (113.9 kg)     Height 07/12/20 0506 6' (1.829 m)     Head Circumference --      Peak Flow --      Pain Score 07/12/20 0506 10     Pain Loc --      Pain Edu? --      Excl. in GC? --     Constitutional: Alert and oriented. Eyes: Conjunctivae are normal. Head: Atraumatic. Nose: No congestion/rhinnorhea. Mouth/Throat: Mucous membranes are moist. Neck: Normal  ROM Cardiovascular: Normal rate, regular rhythm. Grossly normal heart sounds. Respiratory: Normal respiratory effort.  No retractions. Lungs CTAB. Gastrointestinal: Soft and nontender. No distention. Genitourinary: deferred Musculoskeletal: Erythema, edema, and warmth to right lateral calf with central area of fluctuance and surrounding induration.  Area is tender to palpation with small amount of bloody and purulent drainage from the center. Neurologic:  Normal speech and language. No gross focal neurologic deficits are appreciated. Skin:  Skin is warm, dry and intact.  Psychiatric: Mood and affect are normal. Speech and behavior are normal.  ____________________________________________   LABS (all labs ordered are listed, but only abnormal results are displayed)  Labs Reviewed  CBC - Abnormal; Notable for the following components:      Result Value   Hemoglobin 12.2 (*)  MCH 24.9 (*)    All other components within normal limits  COMPREHENSIVE METABOLIC PANEL - Abnormal; Notable for the following components:   Glucose, Bld 113 (*)    Calcium 8.7 (*)    All other components within normal limits     PROCEDURES  Procedure(s) performed (including Critical Care):  Marland KitchenMarland KitchenIncision and Drainage  Date/Time: 07/12/2020 8:36 AM Performed by: Chesley Noon, MD Authorized by: Chesley Noon, MD   Consent:    Consent obtained:  Verbal   Consent given by:  Patient   Risks discussed:  Incomplete drainage, bleeding and pain   Alternatives discussed:  No treatment Location:    Type:  Abscess   Location:  Lower extremity   Lower extremity location:  Leg   Leg location:  R lower leg Anesthesia (see MAR for exact dosages):    Anesthesia method:  Local infiltration   Local anesthetic:  Lidocaine 2% WITH epi Procedure type:    Complexity:  Simple Procedure details:    Needle aspiration: no     Incision types:  Elliptical   Scalpel blade:  11   Wound management:  Probed and  deloculated and irrigated with saline   Drainage:  Purulent   Drainage amount:  Moderate   Wound treatment:  Wound left open   Packing materials:  None Post-procedure details:    Patient tolerance of procedure:  Tolerated well, no immediate complications     ____________________________________________   INITIAL IMPRESSION / ASSESSMENT AND PLAN / ED COURSE       44 year old male presents to the ED with a couple weeks of pain and swelling to his right lateral calf that has now worsened over the weekend.  Area to his calf is consistent with an abscess, he is afebrile and does not appear systemically ill.  Lab work is reassuring and we will plan to I&D abscess to allow for adequate drainage, plan to continue patient on recently prescribed course of Bactrim and have him follow-up closely with general surgery.  Incision and drainage performed of right lower leg abscess with moderate purulent drainage.  Dressing placed and patient is appropriate for discharge home with general surgery follow-up, counseled to continue antibiotics that were previously prescribed and to return to the ED for new worsening symptoms.  Patient agrees with plan.      ____________________________________________   FINAL CLINICAL IMPRESSION(S) / ED DIAGNOSES  Final diagnoses:  Leg abscess     ED Discharge Orders    None       Note:  This document was prepared using Dragon voice recognition software and may include unintentional dictation errors.   Chesley Noon, MD 07/12/20 [phone]

## 2020-07-12 NOTE — ED Notes (Signed)
Non adherent dressing applied to RLE

## 2020-07-12 NOTE — ED Triage Notes (Signed)
Patient states that he has had an abscess on  the back of his right lower leg times three weeks. Patient states that the abscess has become larger. Patient states that he started having swelling to his ankle and leg that started on Saturday.

## 2020-07-22 ENCOUNTER — Encounter: Payer: Self-pay | Admitting: Physician Assistant

## 2020-07-22 ENCOUNTER — Ambulatory Visit (INDEPENDENT_AMBULATORY_CARE_PROVIDER_SITE_OTHER): Payer: Self-pay | Admitting: Physician Assistant

## 2020-07-22 ENCOUNTER — Other Ambulatory Visit: Payer: Self-pay

## 2020-07-22 VITALS — BP 173/99 | HR 79 | Temp 98.1°F | Ht 72.0 in | Wt 250.8 lb

## 2020-07-22 DIAGNOSIS — Z09 Encounter for follow-up examination after completed treatment for conditions other than malignant neoplasm: Secondary | ICD-10-CM

## 2020-07-22 DIAGNOSIS — L0291 Cutaneous abscess, unspecified: Secondary | ICD-10-CM

## 2020-07-22 NOTE — Patient Instructions (Signed)
Roger Pennington suggest patient to use Medical Soap once or twice a week to clean wound.  He discussed with patient that within a couple of weeks his wound should heal, but with time.  Roger Pennington numbed and cleaned the wound at today's visit. Follow-up with our office as needed. Please call and ask to speak with a nurse if you develop questions or concerns.  Wound Care, Adult Taking care of your wound properly can help to prevent pain, infection, and scarring. It can also help your wound to heal more quickly. How to care for your wound Wound care      Follow instructions from your health care provider about how to take care of your wound. Make sure you: ? Wash your hands with soap and water before you change the bandage (dressing). If soap and water are not available, use hand sanitizer. ? Change your dressing as told by your health care provider. ? Leave stitches (sutures), skin glue, or adhesive strips in place. These skin closures may need to stay in place for 2 weeks or longer. If adhesive strip edges start to loosen and curl up, you may trim the loose edges. Do not remove adhesive strips completely unless your health care provider tells you to do that.  Check your wound area every day for signs of infection. Check for: ? Redness, swelling, or pain. ? Fluid or blood. ? Warmth. ? Pus or a bad smell.  Ask your health care provider if you should clean the wound with mild soap and water. Doing this may include: ? Using a clean towel to pat the wound dry after cleaning it. Do not rub or scrub the wound. ? Applying a cream or ointment. Do this only as told by your health care provider. ? Covering the incision with a clean dressing.  Ask your health care provider when you can leave the wound uncovered.  Keep the dressing dry until your health care provider says it can be removed. Do not take baths, swim, use a hot tub, or do anything that would put the wound underwater until your health care provider  approves. Ask your health care provider if you can take showers. You may only be allowed to take sponge baths. Medicines   If you were prescribed an antibiotic medicine, cream, or ointment, take or use the antibiotic as told by your health care provider. Do not stop taking or using the antibiotic even if your condition improves.  Take over-the-counter and prescription medicines only as told by your health care provider. If you were prescribed pain medicine, take it 30 or more minutes before you do any wound care or as told by your health care provider. General instructions  Return to your normal activities as told by your health care provider. Ask your health care provider what activities are safe.  Do not scratch or pick at the wound.  Do not use any products that contain nicotine or tobacco, such as cigarettes and e-cigarettes. These may delay wound healing. If you need help quitting, ask your health care provider.  Keep all follow-up visits as told by your health care provider. This is important.  Eat a diet that includes protein, vitamin A, vitamin C, and other nutrient-rich foods to help the wound heal. ? Foods rich in protein include meat, dairy, beans, nuts, and other sources. ? Foods rich in vitamin A include carrots and dark green, leafy vegetables. ? Foods rich in vitamin C include citrus, tomatoes, and other fruits and vegetables. ?  Nutrient-rich foods have protein, carbohydrates, fat, vitamins, or minerals. Eat a variety of healthy foods including vegetables, fruits, and whole grains. Contact a health care provider if:  You received a tetanus shot and you have swelling, severe pain, redness, or bleeding at the injection site.  Your pain is not controlled with medicine.  You have redness, swelling, or pain around the wound.  You have fluid or blood coming from the wound.  Your wound feels warm to the touch.  You have pus or a bad smell coming from the wound.  You have a  fever or chills.  You are nauseous or you vomit.  You are dizzy. Get help right away if:  You have a red streak going away from your wound.  The edges of the wound open up and separate.  Your wound is bleeding, and the bleeding does not stop with gentle pressure.  You have a rash.  You faint.  You have trouble breathing. Summary  Always wash your hands with soap and water before changing your bandage (dressing).  To help with healing, eat foods that are rich in protein, vitamin A, vitamin C, and other nutrients.  Check your wound every day for signs of infection. Contact your health care provider if you suspect that your wound is infected. This information is not intended to replace advice given to you by your health care provider. Make sure you discuss any questions you have with your health care provider. Document Revised: 03/24/2019 Document Reviewed: 06/20/2016 Elsevier Patient Education  2020 [employer].

## 2020-07-22 NOTE — Progress Notes (Signed)
[facility] POST-OP OFFICE VISIT  07/22/2020  HPI: Roger Pennington is a 44 y.o. male ~30 days s/p incision and drainage of left anterior thigh abscess which grew out staph.   I last saw him on 07/23 for the above. At that time, he had an additional area of concern on his right lateral calf however denied my offer for I&D. I started him on Bactrim x14 days for this as well as Chlorhexidine washes. He ultimately presented to the ED on 07/26 and had I&D of this right calf abscess.   Since then, he has continued to feel better. The pain in his right leg has improved. His I&D site is healing well. He denied any further areas of concern. No fever or chills. He continues to do chlorhexidine showers daily and mupirocin in nares daily. No other complaints.   Vital signs: BP (!) 173/99   Pulse 79   Temp 98.1 F (36.7 C) (Oral)   Ht 6' (1.829 m)   Wt 250 lb 12.8 oz (113.8 kg)   SpO2 98%   BMI 34.01 kg/m    Physical Exam: Constitutional: Well appearing male, NAD Skin: Left anterior thigh I&D site well healed. I&D site to the right lateral calf, healing well, there was about 50% fibrinous tissue in the wound bed which I removed revealing 100% granular tissue, no further erythema or purulence.   Assessment/Plan: This is a 44 y.o. male ~30 days s/p incision and drainage of left anterior thigh abscess which grew out staph and 10 s/p I&D of right lateral    - No further intervention warranted  - continue superficial dressing changes daily until healed  - He can back down on the chlorhexidine washes to once weekly  - No further need for ABx  - At this point I will follow up on as needed basis, he will call with questions/concerns   -- Lynden Oxford, PA-C [facility] 07/22/2020, 10:46 AM [phone] M-F: 7am - 4pm

## 2020-08-12 ENCOUNTER — Telehealth: Payer: Self-pay | Admitting: Pharmacist

## 2020-08-12 NOTE — Telephone Encounter (Signed)
Patient failed to provide requested 2021 financial documentation. No additional medication assistance will be provided by [facility] without the required proof of income documentation. Patient notified by letter Debra Cheek Administrative Assistant [facility] 

## 2020-09-02 ENCOUNTER — Encounter: Payer: Self-pay | Admitting: Emergency Medicine

## 2020-09-02 ENCOUNTER — Emergency Department
Admission: EM | Admit: 2020-09-02 | Discharge: 2020-09-02 | Disposition: A | Discharge status: Home / Self Care | Payer: Self-pay | Attending: Emergency Medicine | Admitting: Emergency Medicine

## 2020-09-02 ENCOUNTER — Other Ambulatory Visit: Payer: Self-pay

## 2020-09-02 DIAGNOSIS — Z794 Long term (current) use of insulin: Secondary | ICD-10-CM | POA: Insufficient documentation

## 2020-09-02 DIAGNOSIS — M79602 Pain in left arm: Secondary | ICD-10-CM | POA: Insufficient documentation

## 2020-09-02 DIAGNOSIS — Z87891 Personal history of nicotine dependence: Secondary | ICD-10-CM | POA: Insufficient documentation

## 2020-09-02 DIAGNOSIS — Z760 Encounter for issue of repeat prescription: Secondary | ICD-10-CM | POA: Insufficient documentation

## 2020-09-02 DIAGNOSIS — E119 Type 2 diabetes mellitus without complications: Secondary | ICD-10-CM | POA: Insufficient documentation

## 2020-09-02 DIAGNOSIS — D509 Iron deficiency anemia, unspecified: Secondary | ICD-10-CM | POA: Insufficient documentation

## 2020-09-02 DIAGNOSIS — Z79899 Other long term (current) drug therapy: Secondary | ICD-10-CM | POA: Insufficient documentation

## 2020-09-02 DIAGNOSIS — I10 Essential (primary) hypertension: Secondary | ICD-10-CM | POA: Insufficient documentation

## 2020-09-02 LAB — CBC
HCT: 38.2 % — ABNORMAL LOW (ref 39.0–52.0)
Hemoglobin: 12.2 g/dL — ABNORMAL LOW (ref 13.0–17.0)
MCH: 25 pg — ABNORMAL LOW (ref 26.0–34.0)
MCHC: 31.9 g/dL (ref 30.0–36.0)
MCV: 78.3 fL — ABNORMAL LOW (ref 80.0–100.0)
Platelets: 243 10*3/uL (ref 150–400)
RBC: 4.88 MIL/uL (ref 4.22–5.81)
RDW: 13.6 % (ref 11.5–15.5)
WBC: 6.9 10*3/uL (ref 4.0–10.5)
nRBC: 0 % (ref 0.0–0.2)

## 2020-09-02 LAB — BASIC METABOLIC PANEL
Anion gap: 7 (ref 5–15)
BUN: 12 mg/dL (ref 6–20)
CO2: 27 mmol/L (ref 22–32)
Calcium: 9.1 mg/dL (ref 8.9–10.3)
Chloride: 106 mmol/L (ref 98–111)
Creatinine, Ser: 1.16 mg/dL (ref 0.61–1.24)
GFR calc Af Amer: >60 mL/min (ref >60)
GFR calc non Af Amer: >60 mL/min (ref >60)
Glucose, Bld: 120 mg/dL — ABNORMAL HIGH (ref 70–99)
Potassium: 3.8 mmol/L (ref 3.5–5.1)
Sodium: 140 mmol/L (ref 135–145)

## 2020-09-02 LAB — TROPONIN I (HIGH SENSITIVITY): Troponin I (High Sensitivity): 4 ng/L (ref <18)

## 2020-09-02 MED ORDER — METFORMIN HCL 500 MG PO TABS
1000.0000 mg | ORAL_TABLET | Freq: Once | ORAL | Status: AC
  Administered 2020-09-02: 1000 mg via ORAL
  Filled 2020-09-02: qty 2

## 2020-09-02 MED ORDER — METFORMIN HCL 1000 MG PO TABS: 1000.0000 mg | ORAL_TABLET | Freq: Two times a day (BID) | ORAL | 1 refills | Status: DC

## 2020-09-02 MED ORDER — AMLODIPINE BESYLATE 5 MG PO TABS
5.0000 mg | ORAL_TABLET | Freq: Once | ORAL | Status: AC
  Administered 2020-09-02: 5 mg via ORAL
  Filled 2020-09-02: qty 1

## 2020-09-02 MED ORDER — ACETAMINOPHEN 500 MG PO TABS
1000.0000 mg | ORAL_TABLET | Freq: Once | ORAL | Status: AC
  Administered 2020-09-02: 1000 mg via ORAL
  Filled 2020-09-02: qty 2

## 2020-09-02 MED ORDER — AMLODIPINE BESYLATE 5 MG PO TABS
5.0000 mg | ORAL_TABLET | Freq: Every day | ORAL | 1 refills | Status: DC
Start: 2020-09-02 — End: 2022-02-08

## 2020-09-02 MED ORDER — KETOROLAC TROMETHAMINE 30 MG/ML IJ SOLN
30.0000 mg | Freq: Once | INTRAMUSCULAR | Status: AC
  Administered 2020-09-02: 30 mg via INTRAMUSCULAR
  Filled 2020-09-02: qty 1

## 2020-09-02 MED ORDER — BASAGLAR KWIKPEN 100 UNIT/ML ~~LOC~~ SOPN
25.0000 [IU] | PEN_INJECTOR | Freq: Every day | SUBCUTANEOUS | 1 refills | Status: DC
Start: 2020-09-02 — End: 2022-02-09

## 2020-09-02 MED ORDER — INSULIN GLARGINE 100 UNIT/ML ~~LOC~~ SOLN
25.0000 [IU] | Freq: Once | SUBCUTANEOUS | Status: AC
  Administered 2020-09-02: 25 [IU] via SUBCUTANEOUS
  Filled 2020-09-02: qty 0.25

## 2020-09-02 NOTE — ED Triage Notes (Signed)
Pt does reports a HA and some intermittent sharp pains that run down his left arm.

## 2020-09-02 NOTE — ED Triage Notes (Signed)
Pt reports needs a refil on his blood pressure medication and insulin. Pt states has been out for a month. Pt reports is seen at [facility] but they can't get him in until Oct 9th and they will not refil his meds.

## 2020-09-02 NOTE — Discharge Instructions (Signed)
There is no evidence of strain or damage to your heart.  Prescriptions for your blood pressure and diabetic medications were sent to the [facility] pharmacy.  Return to the ED with any worsening symptoms of chest pain, especially in combination with fevers, passing out or throwing up.

## 2020-09-02 NOTE — ED Notes (Signed)
See triage note  States he is out of his insulin and b/p meds   Denies any sx's

## 2020-09-02 NOTE — ED Provider Notes (Signed)
[facility] Emergency Department Provider Note ____________________________________________   First MD Initiated Contact with Patient 09/02/20 1403     (approximate)  I have reviewed the triage vital signs and the nursing notes.  HISTORY  Chief Complaint Medication Refill   HPI Roger Pennington is a 44 y.o. malewho presents to the ED for evaluation of chronic medication refill.  Chart review indicates history of HTN, DM on insulin and Metformin.   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  He reports having his blood pressure checked daily prior to starting work.  He reports his blood pressure being elevated at work this morning, and his employer urged him to go to his PCP to be evaluated and pick up refills for his medications.  Patient reports [facility], [facility], was unable to see him until the first week of October, 3 weeks from now.  Patient reports being busy with work and life and not having his home prescription medications for the past 1 month.  He reports feeling fine right now, but reports having" intermittent twinges "to his left arm.  He reports intermittent pain that is sharp in nature, radiating down from his left shoulder to his left hand, lasting a matter of seconds before self resolving.  5/10 intensity.  Patient denies this pain right now.  Patient further reporting a bitemporal headache with associated photophobia for the past 3 days.  He reports this pain resolved at home with Excedrin administration, but returned the next day.    Past Medical History:  Diagnosis Date  . Diabetes mellitus without complication (HCC)   . Hypertension   . Tobacco abuse     Patient Active Problem List   Diagnosis Date Noted  . Abscess   . Perineal abscess and cellulitis 05/24/2020  . Sepsis (HCC) 05/24/2020  . Diabetes mellitus without complication (HCC) 05/24/2020  . Chest pain 05/24/2020  . Lower  abdominal pain 05/24/2020  . Severe recurrent major depression without psychotic features (HCC) 06/05/2019  . Suicide attempt (HCC) 06/05/2019  . Essential hypertension 06/05/2019  . Social anxiety disorder 06/05/2019  . Intentional acetaminophen overdose (HCC) 06/04/2019  . Depression with suicidal ideation 06/04/2019  . Severe recurrent major depression (HCC) 07/02/2017  . Tobacco use disorder 07/02/2017  . Cannabis use disorder, severe, dependence (HCC) 07/02/2017  . Upper GI bleed 02/08/2016    Past Surgical History:  Procedure Laterality Date  . ESOPHAGOGASTRODUODENOSCOPY (EGD) WITH PROPOFOL N/A 02/09/2016   Procedure: ESOPHAGOGASTRODUODENOSCOPY (EGD) WITH PROPOFOL;  Surgeon: Christena Deem, MD;  Location: [facility] ENDOSCOPY;  Service: Endoscopy;  Laterality: N/A;  . INCISION AND DRAINAGE ABSCESS Right 05/25/2020   Procedure: INCISION AND DRAINAGE ABSCESS;  Surgeon: Leafy Ro, MD;  Location: [facility] ORS;  Service: General;  Laterality: Right;    Prior to Admission medications   Medication Sig Start Date End Date Taking? Authorizing Provider  amLODipine (NORVASC) 5 MG tablet Take 5 mg by mouth daily.    [provider]  amLODipine (NORVASC) 5 MG tablet Take 1 tablet (5 mg total) by mouth daily. 09/02/20 11/01/20  Delton Prairie, MD  FLUoxetine (PROZAC) 20 MG capsule Take 1 capsule (20 mg total) by mouth daily. 06/07/19   Clapacs, Jackquline Denmark, MD  ibuprofen (ADVIL) 400 MG tablet Take 1 tablet (400 mg total) by mouth every 6 (six) hours as needed. 06/24/20   Donovan Kail, PA-C  Insulin Glargine [facility] KWIKPEN) 100 UNIT/ML Inject 0.25 mLs (25  Units total) into the skin daily. 05/28/20 06/27/20  Delfino Lovett, MD  Insulin Glargine [facility] KWIKPEN) 100 UNIT/ML Inject 25 Units into the skin daily. 09/02/20   Delton Prairie, MD  Insulin Pen Needle 32G X 4 MM MISC 90 Units by Does not apply route 3 (three) times daily. 05/28/20   Delfino Lovett, MD  Lactobacillus (ACIDOPHILUS PROBIOTIC) 10 MG  CAPS take 1 tablet by mouth twice a day as needed for GI upset 06/02/20   [provider]  metFORMIN (GLUCOPHAGE) 1000 MG tablet Take 1,000 mg by mouth 2 (two) times daily with a meal.    [provider]  metFORMIN (GLUCOPHAGE) 1000 MG tablet Take 1 tablet (1,000 mg total) by mouth 2 (two) times daily with a meal. 09/02/20 11/01/20  Delton Prairie, MD  mupirocin nasal ointment (BACTROBAN NASAL) 2 % Place 1 application into the nose 2 (two) times daily. Use one-half of tube in each nostril twice daily for five (5) days. After application, press sides of nose together and gently massage. 07/09/20   Donovan Kail, PA-C  TRADJENTA 5 MG TABS tablet Take 5 mg by mouth daily. 05/01/20   [provider]    Allergies Patient has no known allergies.  Family History  Problem Relation Age of Onset  . Hypertension Father   . Hypertension Brother     Social History Social History   Tobacco Use  . Smoking status: Former Smoker    Types: Cigarettes    Quit date: 06/17/2020    Years since quitting: 0.2  . Smokeless tobacco: Never Used  Substance Use Topics  . Alcohol use: Yes    Alcohol/week: 0.0 standard drinks    Comment: occ  . Drug use: No    Review of Systems  Constitutional: No fever/chills Eyes: No visual changes. ENT: No sore throat. Cardiovascular: Denies chest pain. Respiratory: Denies shortness of breath. Gastrointestinal: No abdominal pain.  No nausea, no vomiting.  No diarrhea.  No constipation. Genitourinary: Negative for dysuria. Musculoskeletal: Negative for back pain.  Positive for left arm pain. Skin: Negative for rash. Neurological: Negative for headaches, focal weakness or numbness.  ____________________________________________   PHYSICAL EXAM:  VITAL SIGNS: Vitals:   09/02/20 1125 09/02/20 1328  BP: (!) 144/91 (!) 147/102  Pulse: 69 70  Resp: 18 16  Temp: 98.2 F (36.8 C) 98.7 F (37.1 C)  SpO2: 99% 100%      Constitutional:  Alert and oriented. Well appearing and in no acute distress. Eyes: Conjunctivae are normal. PERRL. EOMI. Head: Atraumatic. Nose: No congestion/rhinnorhea. Mouth/Throat: Mucous membranes are moist.  Oropharynx non-erythematous. Neck: No stridor. No cervical spine tenderness to palpation. Cardiovascular: Normal rate, regular rhythm. Grossly normal heart sounds.  Good peripheral circulation. Respiratory: Normal respiratory effort.  No retractions. Lungs CTAB. Gastrointestinal: Soft , nondistended, nontender to palpation. No abdominal bruits. No CVA tenderness. Musculoskeletal: No lower extremity tenderness nor edema.  No joint effusions. No signs of acute trauma. Neurologic:  Normal speech and language. No gross focal neurologic deficits are appreciated. No gait instability noted. Cranial nerves II through XII intact 5/5 strength and sensation in all 4 extremities Independently ambulatory with a normal gait. Skin:  Skin is warm, dry and intact. No rash noted. Psychiatric: Mood and affect are normal. Speech and behavior are normal.  ____________________________________________   LABS (all labs ordered are listed, but only abnormal results are displayed)  Labs Reviewed  BASIC METABOLIC PANEL - Abnormal; Notable for the following components:  Result Value   Glucose, Bld 120 (*)    All other components within normal limits  CBC - Abnormal; Notable for the following components:   Hemoglobin 12.2 (*)    HCT 38.2 (*)    MCV 78.3 (*)    MCH 25.0 (*)    All other components within normal limits  TROPONIN I (HIGH SENSITIVITY)   ____________________________________________  12 Lead EKG Twelve-lead EKG shows sinus rhythm, rate of 67 bpm.  Normal axis, incomplete right bundle and otherwise normal intervals.  No evidence of acute ischemia  ____________________________________________   PROCEDURES and INTERVENTIONS  Procedure(s) performed (including Critical  Care):  Procedures  Medications  insulin glargine (LANTUS) injection 25 Units (has no administration in time range)  metFORMIN (GLUCOPHAGE) tablet 1,000 mg (has no administration in time range)  acetaminophen (TYLENOL) tablet 1,000 mg (1,000 mg Oral Given 09/02/20 1454)  ketorolac (TORADOL) 30 MG/ML injection 30 mg (30 mg Intramuscular Given 09/02/20 1452)  amLODipine (NORVASC) tablet 5 mg (5 mg Oral Given 09/02/20 1454)    ____________________________________________   MDM / ED COURSE  44 year old man presents from home for chronic medication refill, with symptoms of intermittent left arm pain without evidence of ACS, and amenable to outpatient management with PCP follow-up.  Patient with mild persistent hypertension with systolics in the 140s, otherwise normal vital signs on room air.  Exam without acute derangements or pathology.  Patient is well-appearing without distress, neurovascular deficits or signs of trauma.  No evidence of endorgan damage from his mild hypertension.  Blood work is further reassuring, demonstrating microcytic anemia at baseline.  EKG is nonischemic and troponin is negative.  Patient reports a mild migrainous headache at home, resolving with home Excedrin, and similar headache here that resolves after Toradol administration.  He is neurologically intact and has no indications for advanced head imaging.  Provided patient 1 month prescriptions with a refill for his antihypertensives and diabetic medications.  He has an upcoming appointment with his PCP in 3 weeks.  We discussed outpatient management and medication compliance.  We discussed return precautions for the ED.  Patient medically stable for discharge home.  Clinical Course as of Sep 02 1521  Thu Sep 02, 2020  1521 Reassessed.  Patient reports resolution of his headache after Toradol administration.  Denies any chest pain or arm pain at this time.   [DS]    Clinical Course User Index [DS] Delton Prairie, MD      ____________________________________________   FINAL CLINICAL IMPRESSION(S) / ED DIAGNOSES  Final diagnoses:  Medication refill  Left arm pain  Essential hypertension  Microcytic anemia     ED Discharge Orders         Ordered    amLODipine (NORVASC) 5 MG tablet  Daily        09/02/20 1500    Insulin Glargine (BASAGLAR KWIKPEN) 100 UNIT/ML  Daily        09/02/20 1500    metFORMIN (GLUCOPHAGE) 1000 MG tablet  2 times daily with meals        09/02/20 1500           Hazelyn Kallen   Note:  This document was prepared using Conservation officer, historic buildings and may include unintentional dictation errors.   Delton Prairie, MD 09/02/20 1524

## 2021-04-22 ENCOUNTER — Encounter: Payer: Self-pay | Admitting: Emergency Medicine

## 2021-04-22 ENCOUNTER — Other Ambulatory Visit: Payer: Self-pay

## 2021-04-22 ENCOUNTER — Emergency Department
Admission: EM | Admit: 2021-04-22 | Discharge: 2021-04-22 | Disposition: A | Discharge status: Home / Self Care | Payer: BC Managed Care – PPO | Attending: Emergency Medicine | Admitting: Emergency Medicine

## 2021-04-22 DIAGNOSIS — E119 Type 2 diabetes mellitus without complications: Secondary | ICD-10-CM | POA: Insufficient documentation

## 2021-04-22 DIAGNOSIS — L02411 Cutaneous abscess of right axilla: Secondary | ICD-10-CM | POA: Diagnosis not present

## 2021-04-22 DIAGNOSIS — I10 Essential (primary) hypertension: Secondary | ICD-10-CM | POA: Diagnosis not present

## 2021-04-22 DIAGNOSIS — Z79899 Other long term (current) drug therapy: Secondary | ICD-10-CM | POA: Insufficient documentation

## 2021-04-22 DIAGNOSIS — Z7984 Long term (current) use of oral hypoglycemic drugs: Secondary | ICD-10-CM | POA: Diagnosis not present

## 2021-04-22 DIAGNOSIS — Z87891 Personal history of nicotine dependence: Secondary | ICD-10-CM | POA: Diagnosis not present

## 2021-04-22 DIAGNOSIS — Z794 Long term (current) use of insulin: Secondary | ICD-10-CM | POA: Insufficient documentation

## 2021-04-22 DIAGNOSIS — L0291 Cutaneous abscess, unspecified: Secondary | ICD-10-CM

## 2021-04-22 MED ORDER — LIDOCAINE-PRILOCAINE 2.5-2.5 % EX CREA
TOPICAL_CREAM | Freq: Once | CUTANEOUS | Status: AC
  Filled 2021-04-22: qty 5

## 2021-04-22 MED ORDER — LIDOCAINE HCL 1 % IJ SOLN
5.0000 mL | Freq: Once | INTRAMUSCULAR | Status: AC
  Administered 2021-04-22: 5 mL
  Filled 2021-04-22: qty 10

## 2021-04-22 MED ORDER — DOXYCYCLINE HYCLATE 100 MG PO CAPS: 100.0000 mg | ORAL_CAPSULE | Freq: Two times a day (BID) | ORAL | 0 refills | Status: AC

## 2021-04-22 NOTE — ED Provider Notes (Signed)
[facility]-EMERGENCY DEPARTMENT  ____________________________________________  Time seen: Approximately 6:57 PM  I have reviewed the triage vital signs and the nursing notes.   HISTORY  Chief Complaint Abscess   Historian Patient     HPI Roger Pennington is a 45 y.o. male with a history of diabetes and cutaneous abscesses, presents to the emergency department with a 2 cm x 2 cm right axillary abscess that has been present for approximately 1 week.  Patient denies fever and chills.  No other alleviating measures have been attempted.   Past Medical History:  Diagnosis Date  . Diabetes mellitus without complication (HCC)   . Hypertension   . Tobacco abuse      Immunizations up to date:  Yes.     Past Medical History:  Diagnosis Date  . Diabetes mellitus without complication (HCC)   . Hypertension   . Tobacco abuse     Patient Active Problem List   Diagnosis Date Noted  . Abscess   . Perineal abscess and cellulitis 05/24/2020  . Sepsis (HCC) 05/24/2020  . Diabetes mellitus without complication (HCC) 05/24/2020  . Chest pain 05/24/2020  . Lower abdominal pain 05/24/2020  . Severe recurrent major depression without psychotic features (HCC) 06/05/2019  . Suicide attempt (HCC) 06/05/2019  . Essential hypertension 06/05/2019  . Social anxiety disorder 06/05/2019  . Intentional acetaminophen overdose (HCC) 06/04/2019  . Depression with suicidal ideation 06/04/2019  . Severe recurrent major depression (HCC) 07/02/2017  . Tobacco use disorder 07/02/2017  . Cannabis use disorder, severe, dependence (HCC) 07/02/2017  . Upper GI bleed 02/08/2016    Past Surgical History:  Procedure Laterality Date  . ESOPHAGOGASTRODUODENOSCOPY (EGD) WITH PROPOFOL N/A 02/09/2016   Procedure: ESOPHAGOGASTRODUODENOSCOPY (EGD) WITH PROPOFOL;  Surgeon: Christena Deem, MD;  Location: [facility] ENDOSCOPY;  Service: Endoscopy;  Laterality: N/A;  . INCISION AND DRAINAGE ABSCESS Right 05/25/2020    Procedure: INCISION AND DRAINAGE ABSCESS;  Surgeon: Leafy Ro, MD;  Location: [facility] ORS;  Service: General;  Laterality: Right;    Prior to Admission medications   Medication Sig Start Date End Date Taking? Authorizing Provider  doxycycline (VIBRAMYCIN) 100 MG capsule Take 1 capsule (100 mg total) by mouth 2 (two) times daily for 7 days. 04/22/21 04/29/21 Yes Pia Mau M, PA-C  amLODipine (NORVASC) 5 MG tablet Take 5 mg by mouth daily.    [provider]  amLODipine (NORVASC) 5 MG tablet Take 1 tablet (5 mg total) by mouth daily. 09/02/20 11/01/20  Delton Prairie, MD  FLUoxetine (PROZAC) 20 MG capsule Take 1 capsule (20 mg total) by mouth daily. 06/07/19   Clapacs, Jackquline Denmark, MD  ibuprofen (ADVIL) 400 MG tablet Take 1 tablet (400 mg total) by mouth every 6 (six) hours as needed. 06/24/20   Donovan Kail, PA-C  Insulin Glargine (BASAGLAR KWIKPEN) 100 UNIT/ML Inject 0.25 mLs (25 Units total) into the skin daily. 05/28/20 06/27/20  Delfino Lovett, MD  Insulin Glargine [facility] KWIKPEN) 100 UNIT/ML Inject 25 Units into the skin daily. 09/02/20   Delton Prairie, MD  Insulin Pen Needle 32G X 4 MM MISC 90 Units by Does not apply route 3 (three) times daily. 05/28/20   Delfino Lovett, MD  Lactobacillus (ACIDOPHILUS PROBIOTIC) 10 MG CAPS take 1 tablet by mouth twice a day as needed for GI upset 06/02/20   [provider]  metFORMIN (GLUCOPHAGE) 1000 MG tablet Take 1,000 mg by mouth 2 (two) times daily with a meal.    [provider]  metFORMIN (GLUCOPHAGE)  1000 MG tablet Take 1 tablet (1,000 mg total) by mouth 2 (two) times daily with a meal. 09/02/20 11/01/20  Delton Prairie, MD  mupirocin nasal ointment (BACTROBAN NASAL) 2 % Place 1 application into the nose 2 (two) times daily. Use one-half of tube in each nostril twice daily for five (5) days. After application, press sides of nose together and gently massage. 07/09/20   Donovan Kail, PA-C  TRADJENTA 5 MG TABS tablet Take 5 mg by mouth  daily. 05/01/20   [provider]    Allergies Patient has no known allergies.  Family History  Problem Relation Age of Onset  . Hypertension Father   . Hypertension Brother     Social History Social History   Tobacco Use  . Smoking status: Former Smoker    Types: Cigarettes    Quit date: 06/17/2020    Years since quitting: 0.8  . Smokeless tobacco: Never Used  Substance Use Topics  . Alcohol use: Yes    Alcohol/week: 0.0 standard drinks    Comment: occ  . Drug use: No     Review of Systems  Constitutional: No fever/chills Eyes:  No discharge ENT: No upper respiratory complaints. Respiratory: no cough. No SOB/ use of accessory muscles to breath Gastrointestinal:   No nausea, no vomiting.  No diarrhea.  No constipation. Musculoskeletal: Negative for musculoskeletal pain. Skin: Patient has right axillary abscess.     ____________________________________________   PHYSICAL EXAM:  VITAL SIGNS: ED Triage Vitals  Enc Vitals Group     BP 04/22/21 1632 (!) 155/99     Pulse Rate 04/22/21 1632 91     Resp 04/22/21 1632 18     Temp 04/22/21 1632 98.2 F (36.8 C)     Temp src --      SpO2 04/22/21 1632 98 %     Weight 04/22/21 1628 248 lb 0.3 oz (112.5 kg)     Height 04/22/21 1628 6' (1.829 m)     Head Circumference --      Peak Flow --      Pain Score 04/22/21 1628 6     Pain Loc --      Pain Edu? --      Excl. in GC? --      Constitutional: Alert and oriented. Well appearing and in no acute distress. Eyes: Conjunctivae are normal. PERRL. EOMI. Head: Atraumatic. ENT:      Nose: No congestion/rhinnorhea.      Mouth/Throat: Mucous membranes are moist.  Neck: No stridor.  No cervical spine tenderness to palpation. Cardiovascular: Normal rate, regular rhythm. Normal S1 and S2.  Good peripheral circulation. Respiratory: Normal respiratory effort without tachypnea or retractions. Lungs CTAB. Good air entry to the bases with no decreased or absent breath  sounds Gastrointestinal: Bowel sounds x 4 quadrants. Soft and nontender to palpation. No guarding or rigidity. No distention. Musculoskeletal: Full range of motion to all extremities. No obvious deformities noted Neurologic:  Normal for age. No gross focal neurologic deficits are appreciated.  Skin: Patient has a 2 cm x 2 cm right axillary abscess that is fluctuant with approximately 2 cm of surrounding cellulitis. Psychiatric: Mood and affect are normal for age. Speech and behavior are normal.   ____________________________________________   LABS (all labs ordered are listed, but only abnormal results are displayed)  Labs Reviewed - No data to display ____________________________________________  EKG   ____________________________________________  RADIOLOGY   No results found.  ____________________________________________    PROCEDURES  Procedure(s)  performed:     Marland KitchenMarland KitchenIncision and Drainage  Date/Time: 04/22/2021 6:59 PM Performed by: Orvil Feil, PA-C Authorized by: Orvil Feil, PA-C   Consent:    Consent obtained:  Verbal   Consent given by:  Patient   Risks discussed:  Bleeding, infection, incomplete drainage and pain   Alternatives discussed:  Alternative treatment, delayed treatment and observation Universal protocol:    Procedure explained and questions answered to patient or proxy's satisfaction: yes     Patient identity confirmed:  Verbally with patient Location:    Type:  Abscess Anesthesia:    Anesthesia method:  Local infiltration   Local anesthetic:  Lidocaine 1% w/o epi Procedure type:    Complexity:  Complex Procedure details:    Incision types:  Stab incision Post-procedure details:    Procedure completion:  Tolerated well, no immediate complications       Medications  lidocaine (XYLOCAINE) 1 % (with pres) injection 5 mL (5 mLs Infiltration Given by Other 04/22/21 1700)  lidocaine-prilocaine (EMLA) cream ( Topical Given by Other  04/22/21 1700)     ____________________________________________   INITIAL IMPRESSION / ASSESSMENT AND PLAN / ED COURSE  Pertinent labs & imaging results that were available during my care of the patient were reviewed by me and considered in my medical decision making (see chart for details).       Assessment and plan Abscess 45 year old male presents to the emergency department with a right axillary abscess.  Patient underwent incision and drainage without complication.  He was discharged with doxycycline.  Return precautions were given to return with new or worsening symptoms.    ____________________________________________  FINAL CLINICAL IMPRESSION(S) / ED DIAGNOSES  Final diagnoses:  Abscess      NEW MEDICATIONS STARTED DURING THIS VISIT:  ED Discharge Orders         Ordered    doxycycline (VIBRAMYCIN) 100 MG capsule  2 times daily        04/22/21 1830              This chart was dictated using voice recognition software/Dragon. Despite best efforts to proofread, errors can occur which can change the meaning. Any change was purely unintentional.     Orvil Feil, PA-C 04/22/21 1900    Minna Antis, MD 04/25/21 1510

## 2021-04-22 NOTE — Discharge Instructions (Signed)
Take Doxycyline twice daily for the next seven days.  °

## 2021-04-22 NOTE — ED Triage Notes (Signed)
Abscess to right axilla 

## 2021-04-22 NOTE — ED Notes (Signed)
See triage note  Presents with possible abscess under right arm  Noted area couple of days ago

## 2021-04-27 ENCOUNTER — Other Ambulatory Visit: Payer: Self-pay

## 2021-04-27 ENCOUNTER — Emergency Department
Admission: EM | Admit: 2021-04-27 | Discharge: 2021-04-27 | Disposition: A | Discharge status: Home / Self Care | Payer: BC Managed Care – PPO | Attending: Emergency Medicine | Admitting: Emergency Medicine

## 2021-04-27 DIAGNOSIS — Z87891 Personal history of nicotine dependence: Secondary | ICD-10-CM | POA: Insufficient documentation

## 2021-04-27 DIAGNOSIS — L02411 Cutaneous abscess of right axilla: Secondary | ICD-10-CM | POA: Diagnosis present

## 2021-04-27 DIAGNOSIS — Z79899 Other long term (current) drug therapy: Secondary | ICD-10-CM | POA: Insufficient documentation

## 2021-04-27 DIAGNOSIS — Z794 Long term (current) use of insulin: Secondary | ICD-10-CM | POA: Diagnosis not present

## 2021-04-27 DIAGNOSIS — E119 Type 2 diabetes mellitus without complications: Secondary | ICD-10-CM | POA: Diagnosis not present

## 2021-04-27 DIAGNOSIS — Z7984 Long term (current) use of oral hypoglycemic drugs: Secondary | ICD-10-CM | POA: Diagnosis not present

## 2021-04-27 DIAGNOSIS — I10 Essential (primary) hypertension: Secondary | ICD-10-CM | POA: Diagnosis not present

## 2021-04-27 MED ORDER — LIDOCAINE 4 % EX CREA
TOPICAL_CREAM | Freq: Once | CUTANEOUS | Status: AC
  Filled 2021-04-27: qty 5

## 2021-04-27 MED ORDER — KETOROLAC TROMETHAMINE 30 MG/ML IJ SOLN
30.0000 mg | Freq: Once | INTRAMUSCULAR | Status: AC
  Administered 2021-04-27: 30 mg via INTRAMUSCULAR
  Filled 2021-04-27: qty 1

## 2021-04-27 MED ORDER — ACETAMINOPHEN 500 MG PO TABS
1000.0000 mg | ORAL_TABLET | Freq: Once | ORAL | Status: AC
  Administered 2021-04-27: 1000 mg via ORAL
  Filled 2021-04-27: qty 2

## 2021-04-27 MED ORDER — OXYCODONE HCL 5 MG PO TABS
5.0000 mg | ORAL_TABLET | Freq: Once | ORAL | Status: AC
  Administered 2021-04-27: 5 mg via ORAL
  Filled 2021-04-27: qty 1

## 2021-04-27 NOTE — ED Notes (Signed)
See triage note, pt reports seen here a week ago for boils to right under arm, was prescribed meds and had it lanced. States one boil has gotten bigger and more painful, yellow discharge noted.

## 2021-04-27 NOTE — ED Provider Notes (Signed)
[facility] Emergency Department Provider Note ____________________________________________   Event Date/Time   First MD Initiated Contact with Patient 04/27/21 [phone]     (approximate)  I have reviewed the triage vital signs and the nursing notes.  HISTORY  Chief Complaint Abscess   HPI Roger Pennington is a 45 y.o. malewho presents to the ED for evaluation of axillary abscess.  Chart review indicates patient was seen here 5 days ago for the same. Underwent uncomplicated I&D and discharged with doxy.   Patient reports the abscess drained 5 days ago has improved, but "the small one" has gotten significantly larger.  He reports adjacent abscess, similarly to his right axilla, growing in size and discomfort over the past 3-4 days. Denies fever, emesis or trauma.  Denies weakness to the right arm.   Past Medical History:  Diagnosis Date  . Diabetes mellitus without complication (HCC)   . Hypertension   . Tobacco abuse     Patient Active Problem List   Diagnosis Date Noted  . Abscess   . Perineal abscess and cellulitis 05/24/2020  . Sepsis (HCC) 05/24/2020  . Diabetes mellitus without complication (HCC) 05/24/2020  . Chest pain 05/24/2020  . Lower abdominal pain 05/24/2020  . Severe recurrent major depression without psychotic features (HCC) 06/05/2019  . Suicide attempt (HCC) 06/05/2019  . Essential hypertension 06/05/2019  . Social anxiety disorder 06/05/2019  . Intentional acetaminophen overdose (HCC) 06/04/2019  . Depression with suicidal ideation 06/04/2019  . Severe recurrent major depression (HCC) 07/02/2017  . Tobacco use disorder 07/02/2017  . Cannabis use disorder, severe, dependence (HCC) 07/02/2017  . Upper GI bleed 02/08/2016    Past Surgical History:  Procedure Laterality Date  . ESOPHAGOGASTRODUODENOSCOPY (EGD) WITH PROPOFOL N/A 02/09/2016   Procedure: ESOPHAGOGASTRODUODENOSCOPY (EGD) WITH PROPOFOL;  Surgeon: Christena Deem,  MD;  Location: [facility] ENDOSCOPY;  Service: Endoscopy;  Laterality: N/A;  . INCISION AND DRAINAGE ABSCESS Right 05/25/2020   Procedure: INCISION AND DRAINAGE ABSCESS;  Surgeon: Leafy Ro, MD;  Location: [facility] ORS;  Service: General;  Laterality: Right;    Prior to Admission medications   Medication Sig Start Date End Date Taking? Authorizing Provider  amLODipine (NORVASC) 5 MG tablet Take 5 mg by mouth daily.    [provider]  amLODipine (NORVASC) 5 MG tablet Take 1 tablet (5 mg total) by mouth daily. 09/02/20 11/01/20  Delton Prairie, MD  doxycycline (VIBRAMYCIN) 100 MG capsule Take 1 capsule (100 mg total) by mouth 2 (two) times daily for 7 days. 04/22/21 04/29/21  Orvil Feil, PA-C  FLUoxetine (PROZAC) 20 MG capsule Take 1 capsule (20 mg total) by mouth daily. 06/07/19   Clapacs, Jackquline Denmark, MD  ibuprofen (ADVIL) 400 MG tablet Take 1 tablet (400 mg total) by mouth every 6 (six) hours as needed. 06/24/20   Donovan Kail, PA-C  Insulin Glargine (BASAGLAR KWIKPEN) 100 UNIT/ML Inject 0.25 mLs (25 Units total) into the skin daily. 05/28/20 06/27/20  Delfino Lovett, MD  Insulin Glargine [facility] KWIKPEN) 100 UNIT/ML Inject 25 Units into the skin daily. 09/02/20   Delton Prairie, MD  Insulin Pen Needle 32G X 4 MM MISC 90 Units by Does not apply route 3 (three) times daily. 05/28/20   Delfino Lovett, MD  Lactobacillus (ACIDOPHILUS PROBIOTIC) 10 MG CAPS take 1 tablet by mouth twice a day as needed for GI upset 06/02/20   [provider]  metFORMIN (GLUCOPHAGE) 1000 MG tablet Take 1,000 mg by mouth 2 (two) times daily  with a meal.    [provider]  metFORMIN (GLUCOPHAGE) 1000 MG tablet Take 1 tablet (1,000 mg total) by mouth 2 (two) times daily with a meal. 09/02/20 11/01/20  Delton Prairie, MD  mupirocin nasal ointment (BACTROBAN NASAL) 2 % Place 1 application into the nose 2 (two) times daily. Use one-half of tube in each nostril twice daily for five (5) days. After application, press sides of  nose together and gently massage. 07/09/20   Donovan Kail, PA-C  TRADJENTA 5 MG TABS tablet Take 5 mg by mouth daily. 05/01/20   [provider]    Allergies Patient has no known allergies.  Family History  Problem Relation Age of Onset  . Hypertension Father   . Hypertension Brother     Social History Social History   Tobacco Use  . Smoking status: Former Smoker    Types: Cigarettes  . Smokeless tobacco: Never Used  Substance Use Topics  . Alcohol use: Yes    Alcohol/week: 0.0 standard drinks    Comment: occ  . Drug use: Yes    Types: Marijuana    Comment: occ    Review of Systems  Constitutional: No fever/chills Eyes: No visual changes. ENT: No sore throat. Cardiovascular: Denies chest pain. Respiratory: Denies shortness of breath. Gastrointestinal: No abdominal pain.  No nausea, no vomiting.  No diarrhea.  No constipation. Genitourinary: Negative for dysuria. Musculoskeletal: Negative for back pain. Skin: Negative for rash. Neurological: Negative for headaches, focal weakness or numbness.  ____________________________________________   PHYSICAL EXAM:  VITAL SIGNS: Vitals:   04/27/21 1000  BP: (!) 149/104  Pulse: 76  Resp: 18  Temp: 97.9 F (36.6 C)  SpO2: 98%      Constitutional: Alert and oriented. Well appearing and in no acute distress. Eyes: Conjunctivae are normal. PERRL. EOMI. Head: Atraumatic. Nose: No congestion/rhinnorhea. Mouth/Throat: Mucous membranes are moist.  Oropharynx non-erythematous. Neck: No stridor. No cervical spine tenderness to palpation. Cardiovascular: Normal rate, regular rhythm. Grossly normal heart sounds.  Good peripheral circulation. Respiratory: Normal respiratory effort.  No retractions. Lungs CTAB. Gastrointestinal: Soft , nondistended, nontender to palpation. No CVA tenderness. Musculoskeletal: No lower extremity tenderness nor edema.  No joint effusions. No signs of acute trauma. Right axilla has  previously-draining flat area from previously incised abscess. Just inferior to this, about 2 cm, is a 2 x 4 cm area of fluctuance consistent with another an adjacent abscess.  No surrounding induration or erythema. Neurologic:  Normal speech and language. No gross focal neurologic deficits are appreciated. No gait instability noted. Skin:  Skin is warm, dry and intact. No rash noted. Psychiatric: Mood and affect are normal. Speech and behavior are normal.  ____________________________________________   LABS (all labs ordered are listed, but only abnormal results are displayed)  Labs Reviewed - No data to display ____________________________________________  12 Lead EKG   ____________________________________________  RADIOLOGY  ED MD interpretation:    Official radiology report(s): No results found.  ____________________________________________   PROCEDURES and INTERVENTIONS  Procedure(s) performed (including Critical Care):  Marland KitchenMarland KitchenIncision and Drainage  Date/Time: 04/27/2021 4:23 PM Performed by: Delton Prairie, MD Authorized by: Delton Prairie, MD   Consent:    Consent obtained:  Verbal   Consent given by:  Patient   Risks, benefits, and alternatives were discussed: yes     Risks discussed:  Bleeding, pain, incomplete drainage, infection and damage to other organs   Alternatives discussed:  No treatment Location:    Type:  Abscess   Size:  2 x 4 cm   Location:  Upper extremity   Upper extremity location: Right axilla. Pre-procedure details:    Skin preparation:  Antiseptic wash Sedation:    Sedation type:  None Anesthesia:    Anesthesia method:  Topical application   Topical anesthetic:  LET Procedure type:    Complexity:  Complex Procedure details:    Ultrasound guidance: no     Needle aspiration: no     Incision types:  Stab incision   Wound management:  Probed and deloculated   Drainage:  Bloody and purulent   Drainage amount:  Moderate   Wound treatment:   Drain placed   Packing materials:  1/2 in iodoform gauze   Amount 1/2" iodoform:  4 Post-procedure details:    Procedure completion:  Tolerated well, no immediate complications    Medications  lidocaine (LMX) 4 % cream ( Topical Given 04/27/21 1039)  ketorolac (TORADOL) 30 MG/ML injection 30 mg (30 mg Intramuscular Given 04/27/21 1034)  acetaminophen (TYLENOL) tablet 1,000 mg (1,000 mg Oral Given 04/27/21 1033)  oxyCODONE (Oxy IR/ROXICODONE) immediate release tablet 5 mg (5 mg Oral Given 04/27/21 1033)    ____________________________________________   MDM / ED COURSE   45 year old male presents to the ED with recurrent abscess to his right axilla requiring I&D and amenable to outpatient management.  Normal vitals.  Evidence of sepsis, systemic illness or cellulitis.  His previously incised abscess is healing appropriately.  Incised this new abscess with good return of purulent material.  Due to the size of this and location his axilla, placed iodoform packing.  We discussed nonnarcotic multimodal pain control and continuing his doxycycline antibiotic.  We discussed return precautions for the ED.  Clinical Course as of 04/27/21 1622  Wed Apr 27, 2021  1144 I&D complete, well tolerated.  We discussed appropriate management and care of his abscess site.  We discussed continue with antibiotics and we discussed return precautions for the ED.  Answered questions. [DS]    Clinical Course User Index [DS] Delton Prairie, MD    ____________________________________________   FINAL CLINICAL IMPRESSION(S) / ED DIAGNOSES  Final diagnoses:  Abscess of axilla, right     ED Discharge Orders    None       Krystalyn Kubota Katrinka Blazing   Note:  This document was prepared using Dragon voice recognition software and may include unintentional dictation errors.   Delton Prairie, MD 04/27/21 [phone]

## 2021-04-27 NOTE — Discharge Instructions (Signed)
Use Tylenol for pain and fevers.  Up to 1000 mg per dose, up to 4 times per day.  Do not take more than 4000 mg of Tylenol/acetaminophen within 24 hours.. Use naproxen/Aleve for anti-inflammatory pain relief. Use up to 500mg  every 12 hours. Do not take more frequently than this. Do not use other NSAIDs (ibuprofen, Advil) while taking this medication. It is safe to take Tylenol with this.   Continue your doxycycline antibiotic prescription and finish the whole 14 days, even if you are abscesses are looking better.  Return to the ED with any further worsening symptoms despite these measures.

## 2021-04-27 NOTE — ED Notes (Signed)
Pt has been provided with discharge instructions. Pt denies any questions or concerns at this time. Pt verbalizes understanding for follow up care and d/c.  VSS.  Pt left department with all belongings.   Signature pad not working. Pt consented to receiving d/c paperwork and work note.

## 2021-04-27 NOTE — ED Triage Notes (Signed)
Pt comes with c/o of abscess noted to right ax area. Pt states he came last week and had it checked out. Pt states he was prescribed meds to make it bust.  Pt states the top one busted but the other one has grown. Pt states severe pain.

## 2021-06-01 DIAGNOSIS — E119 Type 2 diabetes mellitus without complications: Secondary | ICD-10-CM | POA: Insufficient documentation

## 2021-06-01 HISTORY — DX: Type 2 diabetes mellitus without complications: E11.9

## 2022-02-08 ENCOUNTER — Encounter (HOSPITAL_COMMUNITY): Payer: Self-pay

## 2022-02-08 ENCOUNTER — Emergency Department (HOSPITAL_COMMUNITY): Payer: BC Managed Care – PPO

## 2022-02-08 ENCOUNTER — Observation Stay (HOSPITAL_COMMUNITY): Payer: BC Managed Care – PPO

## 2022-02-08 ENCOUNTER — Other Ambulatory Visit: Payer: Self-pay

## 2022-02-08 ENCOUNTER — Observation Stay (HOSPITAL_BASED_OUTPATIENT_CLINIC_OR_DEPARTMENT_OTHER): Payer: BC Managed Care – PPO

## 2022-02-08 ENCOUNTER — Observation Stay (HOSPITAL_COMMUNITY)
Admission: EM | Admit: 2022-02-08 | Discharge: 2022-02-09 | Disposition: A | Discharge status: Home / Self Care | Payer: BC Managed Care – PPO | Attending: Internal Medicine | Admitting: Internal Medicine

## 2022-02-08 DIAGNOSIS — I6389 Other cerebral infarction: Secondary | ICD-10-CM

## 2022-02-08 DIAGNOSIS — F17210 Nicotine dependence, cigarettes, uncomplicated: Secondary | ICD-10-CM | POA: Insufficient documentation

## 2022-02-08 DIAGNOSIS — Z79899 Other long term (current) drug therapy: Secondary | ICD-10-CM | POA: Diagnosis not present

## 2022-02-08 DIAGNOSIS — R079 Chest pain, unspecified: Secondary | ICD-10-CM | POA: Diagnosis present

## 2022-02-08 DIAGNOSIS — I63033 Cerebral infarction due to thrombosis of bilateral carotid arteries: Secondary | ICD-10-CM

## 2022-02-08 DIAGNOSIS — Z20822 Contact with and (suspected) exposure to covid-19: Secondary | ICD-10-CM | POA: Insufficient documentation

## 2022-02-08 DIAGNOSIS — I10 Essential (primary) hypertension: Secondary | ICD-10-CM | POA: Diagnosis present

## 2022-02-08 DIAGNOSIS — I639 Cerebral infarction, unspecified: Secondary | ICD-10-CM | POA: Diagnosis not present

## 2022-02-08 DIAGNOSIS — R0789 Other chest pain: Secondary | ICD-10-CM | POA: Diagnosis not present

## 2022-02-08 DIAGNOSIS — I5042 Chronic combined systolic (congestive) and diastolic (congestive) heart failure: Secondary | ICD-10-CM | POA: Insufficient documentation

## 2022-02-08 DIAGNOSIS — Z7984 Long term (current) use of oral hypoglycemic drugs: Secondary | ICD-10-CM | POA: Diagnosis not present

## 2022-02-08 DIAGNOSIS — Z794 Long term (current) use of insulin: Secondary | ICD-10-CM | POA: Diagnosis not present

## 2022-02-08 DIAGNOSIS — E1165 Type 2 diabetes mellitus with hyperglycemia: Secondary | ICD-10-CM | POA: Insufficient documentation

## 2022-02-08 DIAGNOSIS — I110 Hypertensive heart disease with heart failure: Secondary | ICD-10-CM | POA: Insufficient documentation

## 2022-02-08 DIAGNOSIS — F332 Major depressive disorder, recurrent severe without psychotic features: Secondary | ICD-10-CM | POA: Diagnosis present

## 2022-02-08 DIAGNOSIS — Z720 Tobacco use: Secondary | ICD-10-CM | POA: Diagnosis not present

## 2022-02-08 DIAGNOSIS — E119 Type 2 diabetes mellitus without complications: Secondary | ICD-10-CM

## 2022-02-08 LAB — GLUCOSE, CAPILLARY
Glucose-Capillary: 157 mg/dL — ABNORMAL HIGH (ref 70–99)
Glucose-Capillary: 166 mg/dL — ABNORMAL HIGH (ref 70–99)

## 2022-02-08 LAB — TROPONIN I (HIGH SENSITIVITY)
Troponin I (High Sensitivity): 5 ng/L (ref <18)
Troponin I (High Sensitivity): 4 ng/L (ref <18)

## 2022-02-08 LAB — ECHOCARDIOGRAM COMPLETE
AR max vel: 3.73 cm2
AV Area VTI: 3.66 cm2
AV Area mean vel: 3.57 cm2
AV Mean grad: 2 mmHg
AV Peak grad: 5 mmHg
Ao pk vel: 1.12 m/s
Area-P 1/2: 2.22 cm2
Height: 72 in
S' Lateral: 3.7 cm
Weight: 4240 oz

## 2022-02-08 LAB — HEPATIC FUNCTION PANEL
ALT: 27 U/L (ref 0–44)
AST: 26 U/L (ref 15–41)
Albumin: 4.1 g/dL (ref 3.5–5.0)
Alkaline Phosphatase: 60 U/L (ref 38–126)
Bilirubin, Direct: <0.1 mg/dL (ref 0.0–0.2)
Total Bilirubin: 0.6 mg/dL (ref 0.3–1.2)
Total Protein: 7.2 g/dL (ref 6.5–8.1)

## 2022-02-08 LAB — BASIC METABOLIC PANEL
Anion gap: 9 (ref 5–15)
BUN: 8 mg/dL (ref 6–20)
CO2: 23 mmol/L (ref 22–32)
Calcium: 8.9 mg/dL (ref 8.9–10.3)
Chloride: 103 mmol/L (ref 98–111)
Creatinine, Ser: 1.04 mg/dL (ref 0.61–1.24)
GFR, Estimated: >60 mL/min (ref >60)
Glucose, Bld: 236 mg/dL — ABNORMAL HIGH (ref 70–99)
Potassium: 4 mmol/L (ref 3.5–5.1)
Sodium: 135 mmol/L (ref 135–145)

## 2022-02-08 LAB — CBC
HCT: 41.5 % (ref 39.0–52.0)
Hemoglobin: 12.9 g/dL — ABNORMAL LOW (ref 13.0–17.0)
MCH: 25.1 pg — ABNORMAL LOW (ref 26.0–34.0)
MCHC: 31.1 g/dL (ref 30.0–36.0)
MCV: 80.9 fL (ref 80.0–100.0)
Platelets: 212 10*3/uL (ref 150–400)
RBC: 5.13 MIL/uL (ref 4.22–5.81)
RDW: 12.2 % (ref 11.5–15.5)
WBC: 5.3 10*3/uL (ref 4.0–10.5)
nRBC: 0 % (ref 0.0–0.2)

## 2022-02-08 LAB — URINALYSIS, ROUTINE W REFLEX MICROSCOPIC
Bilirubin Urine: NEGATIVE
Glucose, UA: 150 mg/dL — AB
Hgb urine dipstick: NEGATIVE
Ketones, ur: NEGATIVE mg/dL
Leukocytes,Ua: NEGATIVE
Nitrite: NEGATIVE
Protein, ur: NEGATIVE mg/dL
Specific Gravity, Urine: 1.024 (ref 1.005–1.030)
pH: 6 (ref 5.0–8.0)

## 2022-02-08 LAB — RESP PANEL BY RT-PCR (FLU A&B, COVID) ARPGX2
Influenza A by PCR: NEGATIVE
Influenza B by PCR: NEGATIVE
SARS Coronavirus 2 by RT PCR: NEGATIVE

## 2022-02-08 LAB — HEMOGLOBIN A1C
Hgb A1c MFr Bld: 7 % — ABNORMAL HIGH (ref 4.8–5.6)
Mean Plasma Glucose: 154 mg/dL

## 2022-02-08 LAB — HIV ANTIBODY (ROUTINE TESTING W REFLEX): HIV Screen 4th Generation wRfx: NONREACTIVE

## 2022-02-08 LAB — SEDIMENTATION RATE: Sed Rate: 2 mm/hr (ref 0–16)

## 2022-02-08 LAB — CBG MONITORING, ED: Glucose-Capillary: 194 mg/dL — ABNORMAL HIGH (ref 70–99)

## 2022-02-08 IMAGING — MR MR CERVICAL SPINE W/O CM
4 of 5 series · 18 of 48 positions shown · non-contrast
Comparison: Concurrently performed brain MRI [DATE].

CLINICAL DATA: Provided history: Cervical radiculopathy, no red
flags. Additional history provided by scanning technologist: Patient
reports headache and chest pain since yesterday, headache for 3
days.

EXAM:
MRI CERVICAL SPINE WITHOUT CONTRAST
TECHNIQUE: Multiplanar, multisequence MR imaging of the cervical spine was
performed. No intravenous contrast was administered.

[Series 3: T2 · sagittal · 3.0mm · 0.43mm/px · 5 of 16 slices shown (1 of 2)]
[im 1/16]
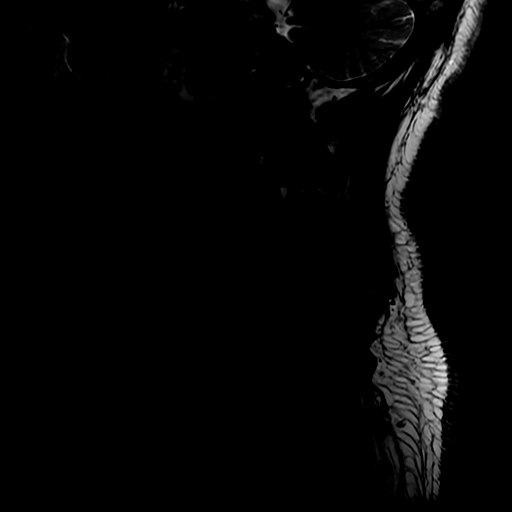
[im 4/16]
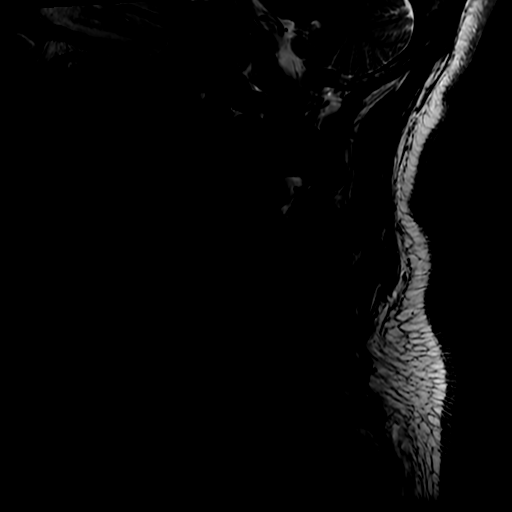
[im 8/16]
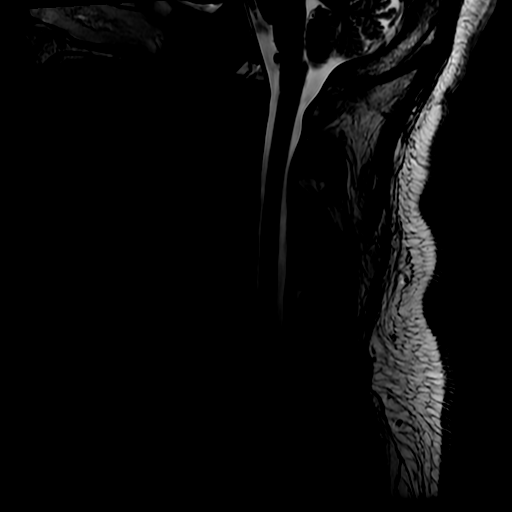
[im 12/16]
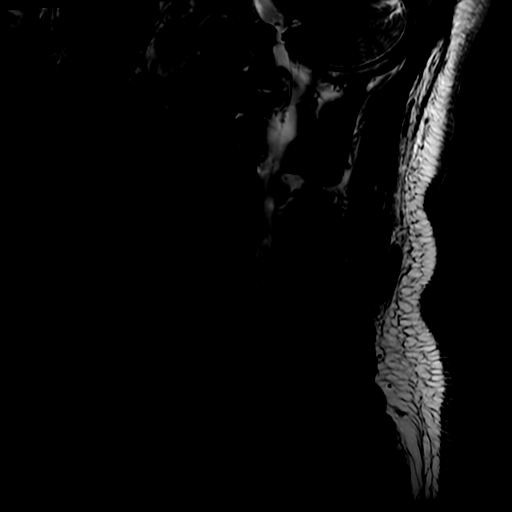
[im 16/16]
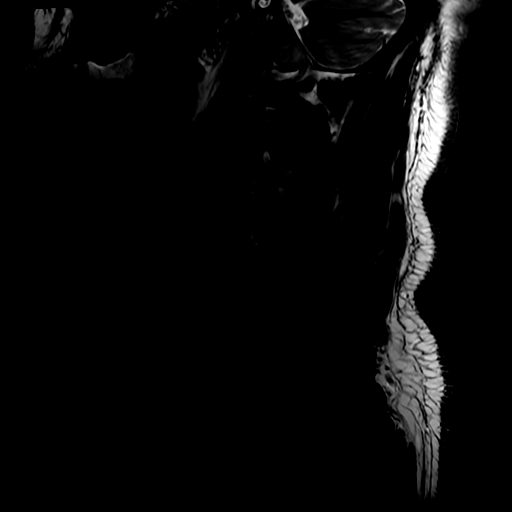

[Series 4: FLAIR · sagittal · 3.0mm · 0.43mm/px · 3 of 16 slices shown]
[im 1/16]
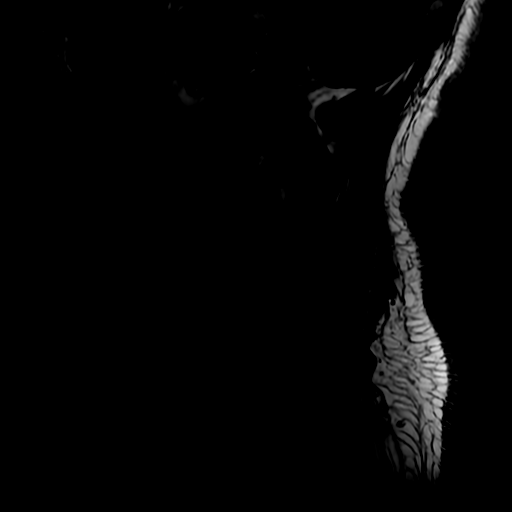
[im 11/16]
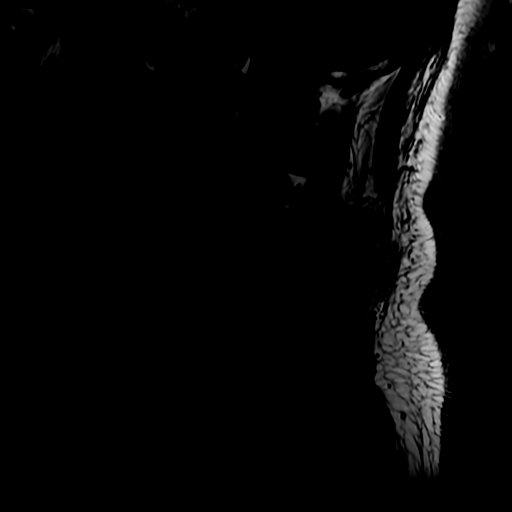
[im 16/16]
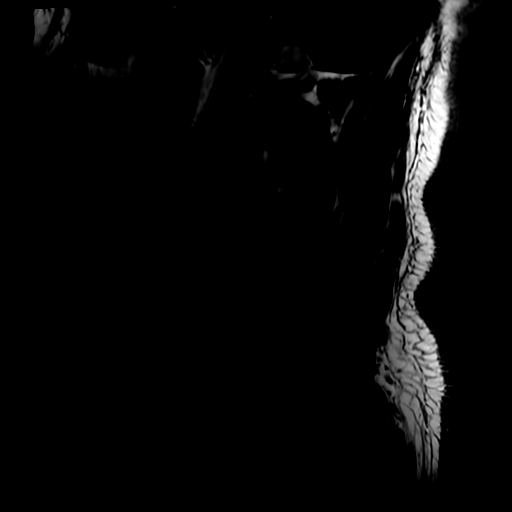

[Series 5: STIR · sagittal · 3.0mm · 0.43mm/px · 3 of 16 slices shown]
[im 1/16]
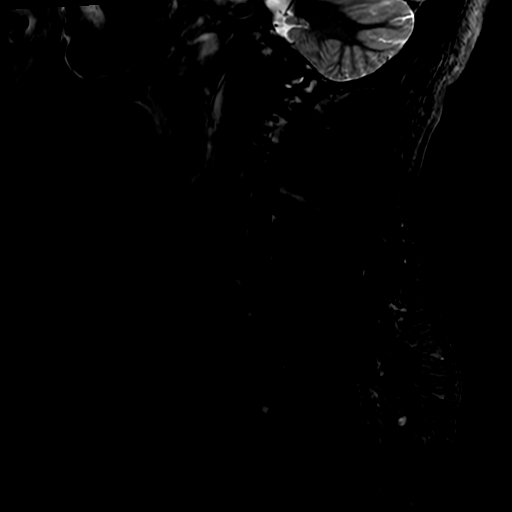
[im 11/16]
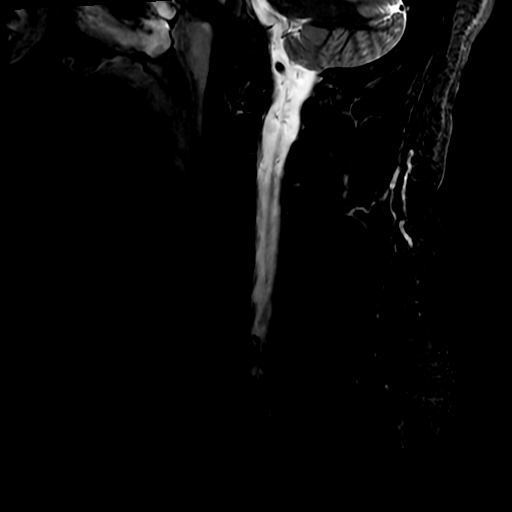
[im 16/16]
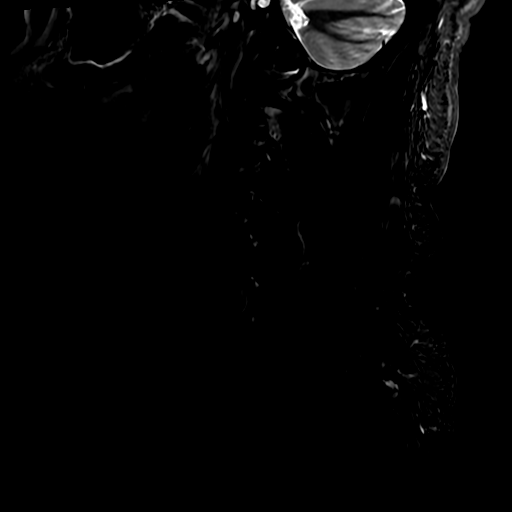

[Series 7: T2 · axial · 3.0mm · 0.35mm/px · z∈[-197,-108]mm · 7 of 33 slices shown (2 of 2)]
[im 1/33]
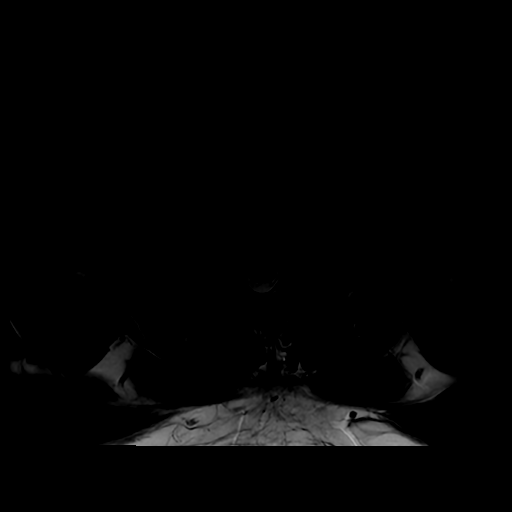
[im 5/33]
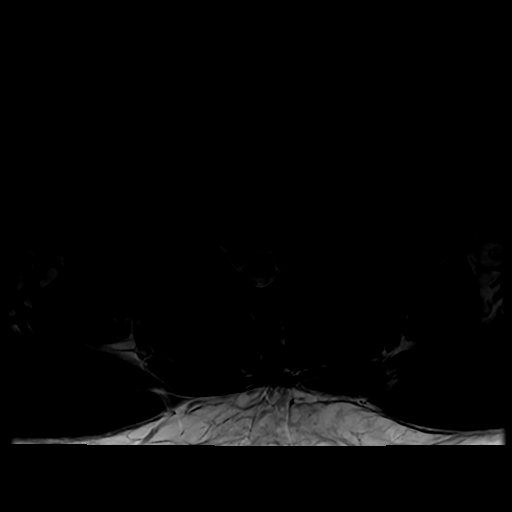
[im 9/33]
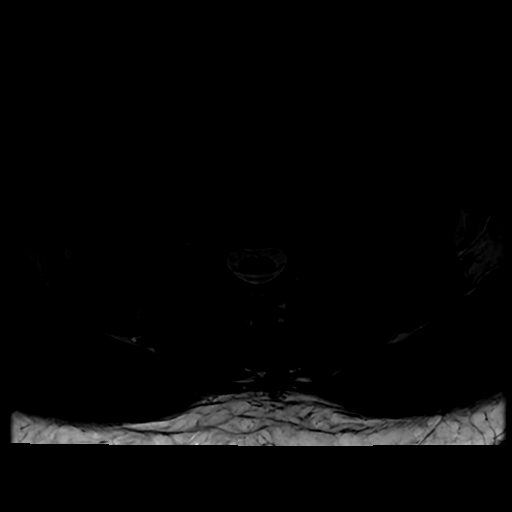
[im 13/33]
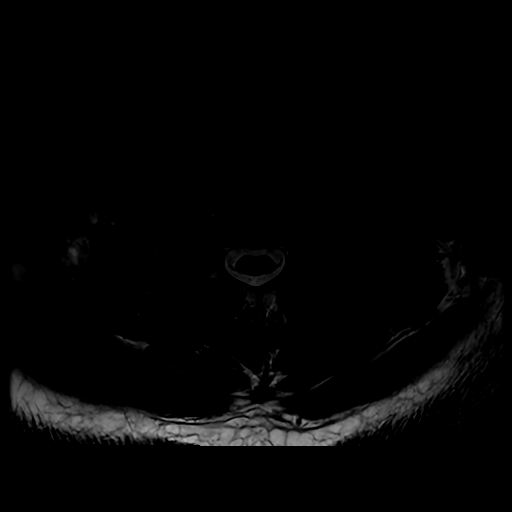
[im 17/33]
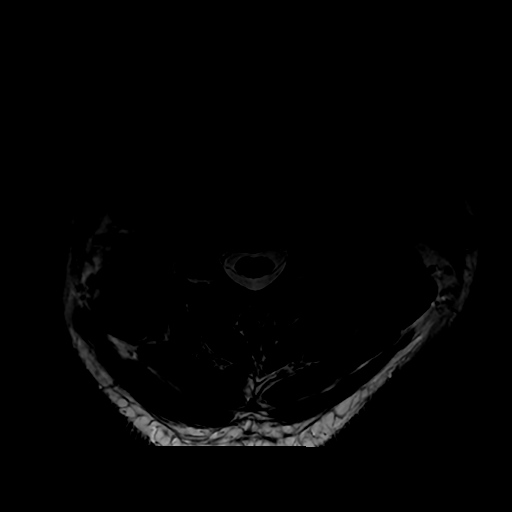
[im 21/33]
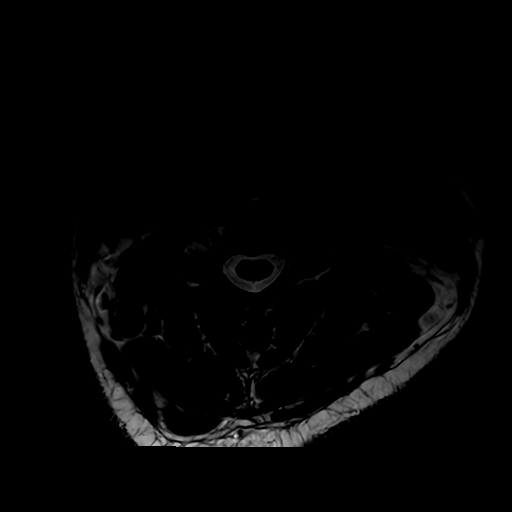
[im 29/33]
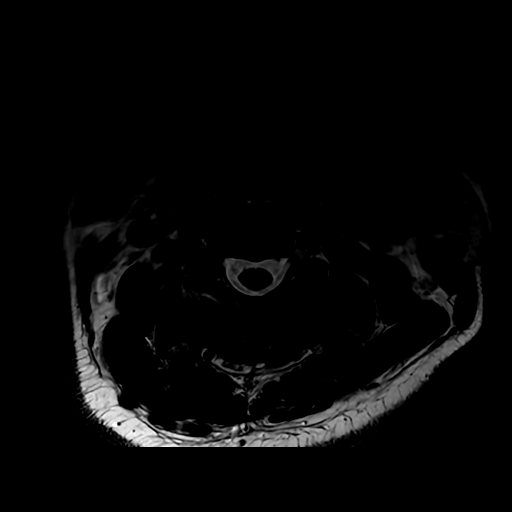

[18 of 48 positions shown; findings below may reference images not displayed]

FINDINGS: Mild intermittent motion degradation.

Alignment: Straightening of the expected cervical lordosis. No
significant spondylolisthesis.

Vertebrae: Vertebral body height is maintained. No significant
marrow edema or focal suspicious osseous lesion. Ventral osteophytes
at C4-C5 and C5-C6.

Cord: No signal abnormality identified within the cervical spinal
cord.

Posterior Fossa, vertebral arteries, paraspinal tissues: Posterior
fossa better assessed on same-day brain MRI. Nonspecific prominence
of the nasopharyngeal/adenoid soft tissues. Flow voids preserved
within the imaged cervical vertebral arteries. Paraspinal soft
tissues unremarkable.

Disc levels:

No more than mild disc degeneration within the cervical spine.

C2-C3: No significant disc herniation or stenosis.

C3-C4: No significant disc herniation or spinal canal stenosis.
Left-sided uncovertebral hypertrophy results in mild to moderate
left neural foraminal narrowing.

C4-C5: No significant disc herniation or spinal canal stenosis.
Left-sided uncovertebral hypertrophy results in mild left neural
foraminal narrowing.

C5-C6: No significant disc herniation or stenosis.

C6-C7: No significant disc herniation or stenosis.

C7-T1: No significant disc herniation or stenosis.
IMPRESSION: Mildly motion degraded exam.

Cervical spondylosis, as outlined. No significant spinal canal
stenosis. Uncovertebral hypertrophy results in mild-to-moderate left
neural foraminal narrowing at C3-C4, and mild left neural foraminal
narrowing at C4-C5.

No more than mild disc degeneration.

Nonspecific straightening of the expected cervical lordosis.

## 2022-02-08 IMAGING — MR MR MRA NECK W/O CM
1 of 3 series · 19 of 48 positions shown · non-contrast
Comparison: No pertinent prior exam.

CLINICAL DATA: Stroke, follow-up

EXAM:
MRA NECK WITHOUT CONTRAST
TECHNIQUE: Angiographic images of the neck were acquired using MRA technique
without intravenous contrast. Carotid stenosis measurements (when
applicable) are obtained utilizing NASCET criteria, using the distal
internal carotid diameter as the denominator.

[Series 4: sag inhance (id) · sagittal · 1.2mm · 0.47mm/px · 19 of 432 slices shown]
[im 1/432]
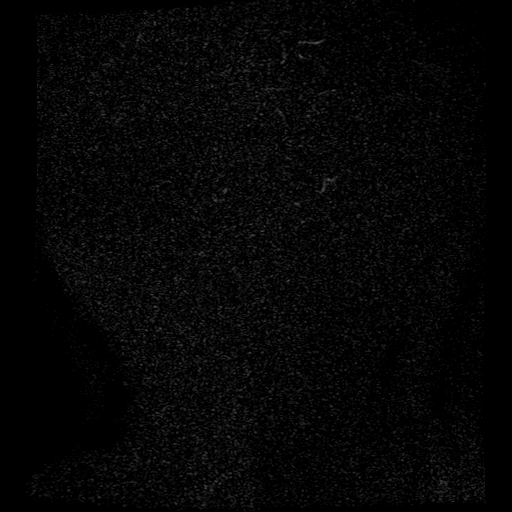
[im 13/432]
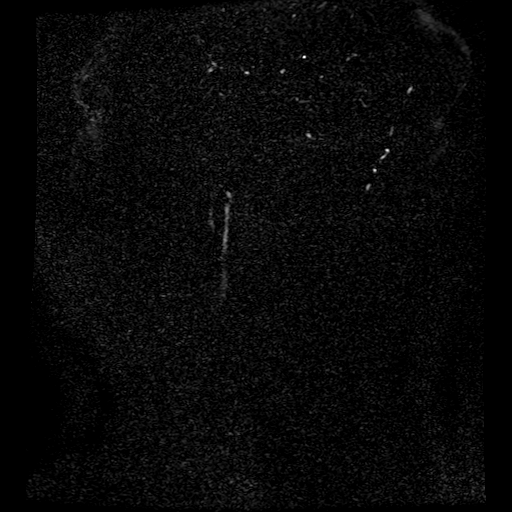
[im 26/432]
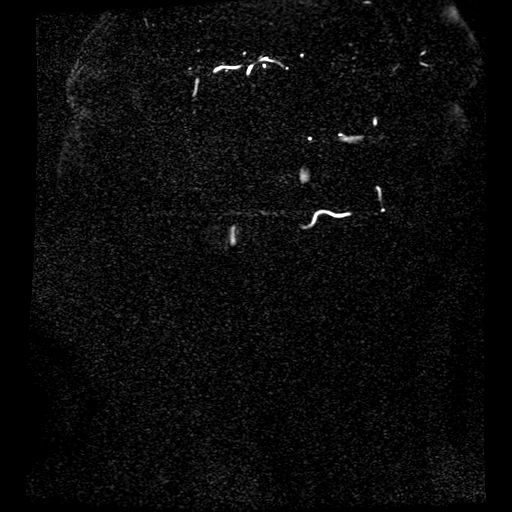
[im 39/432]
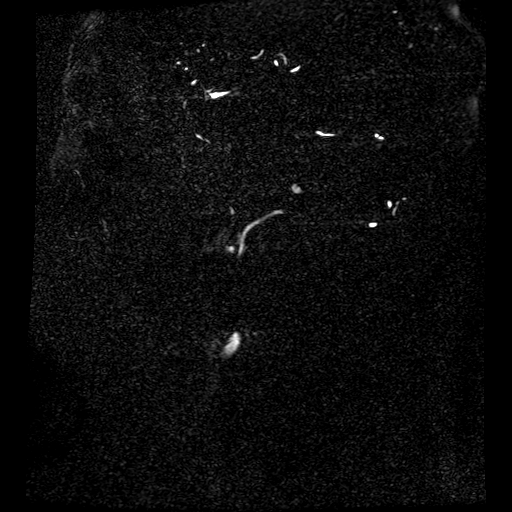
[im 51/432]
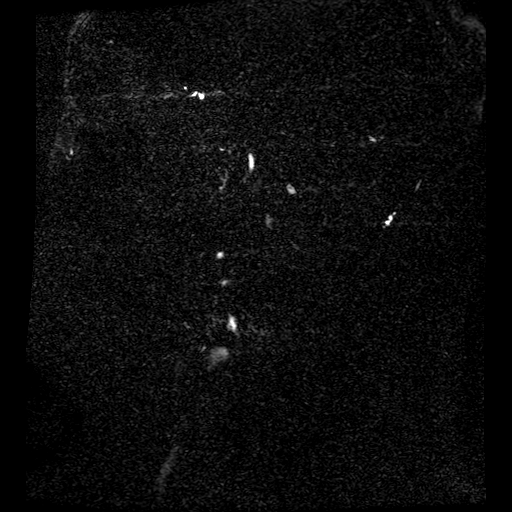
[im 64/432]
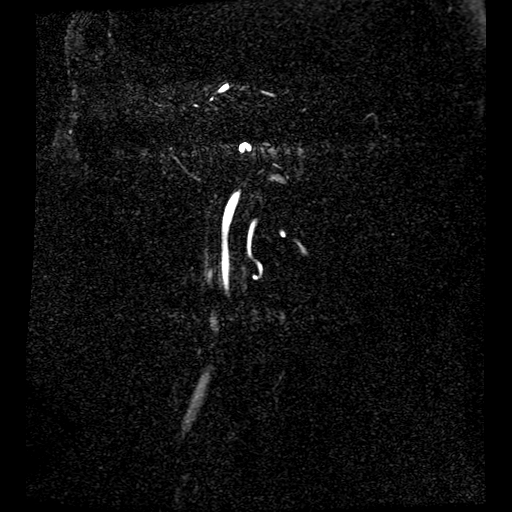
[im 77/432]
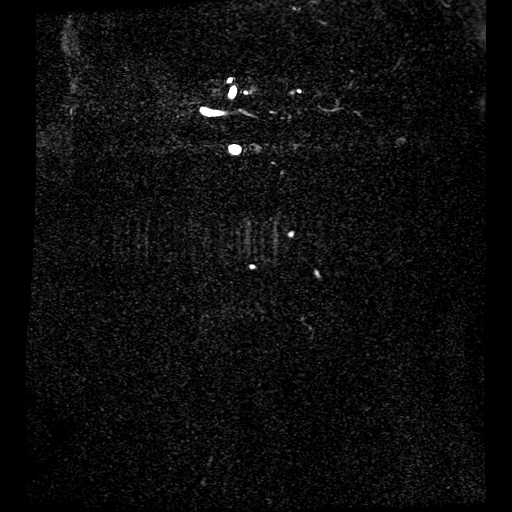
[im 89/432]
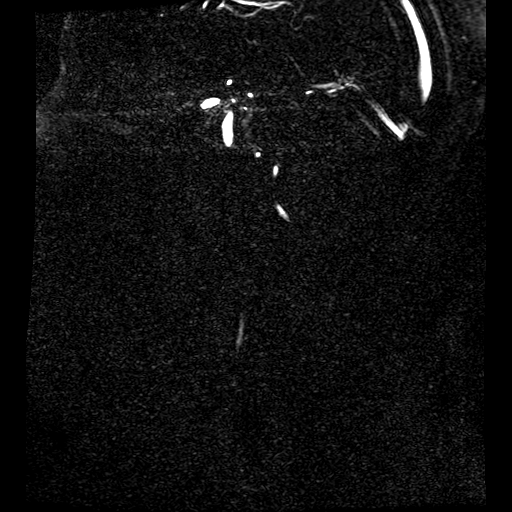
[im 102/432]
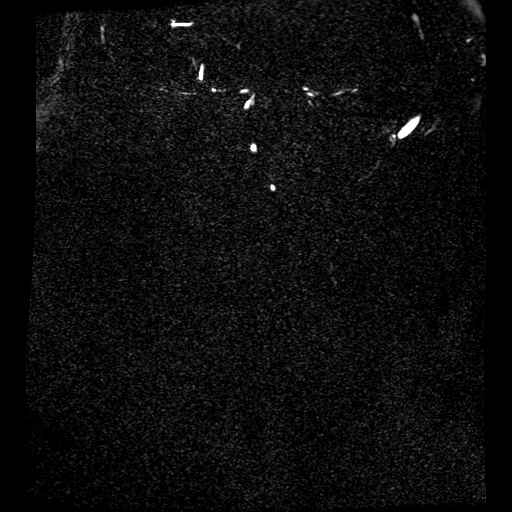
[im 115/432]
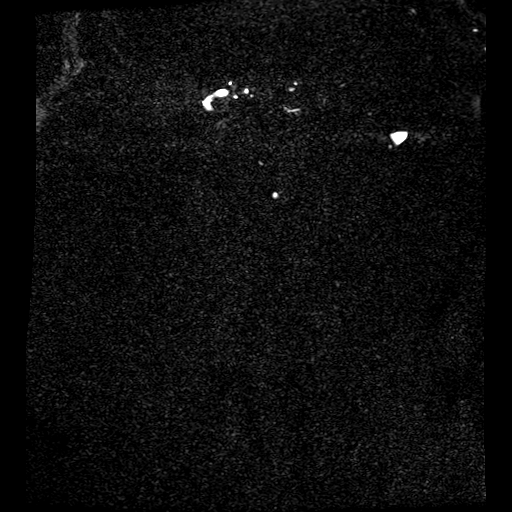
[im 127/432]
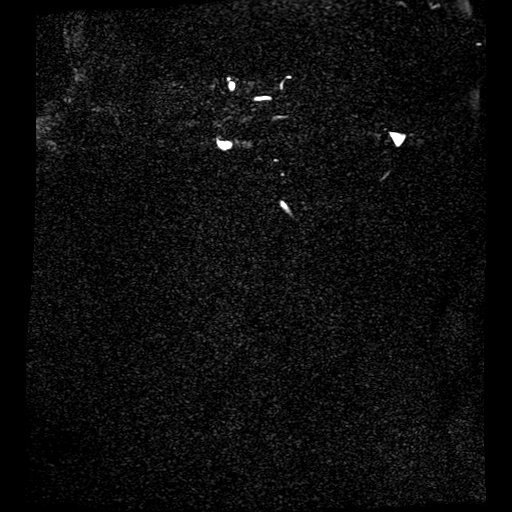
[im 140/432]
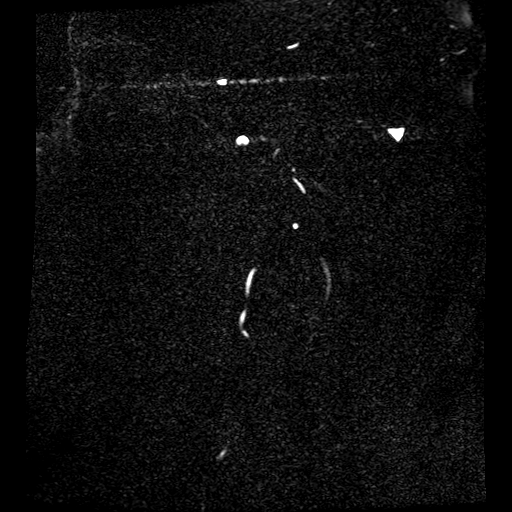
[im 191/432]
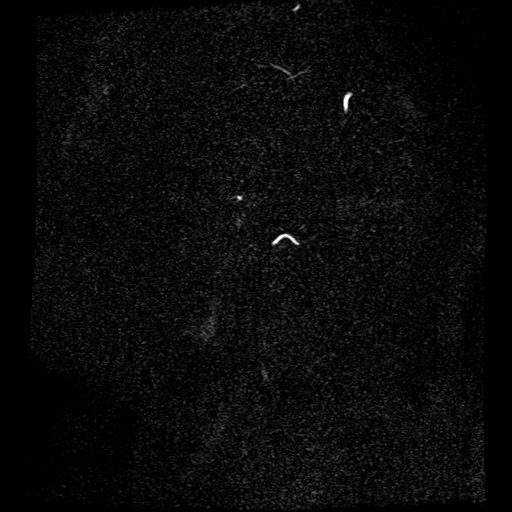
[im 216/432]
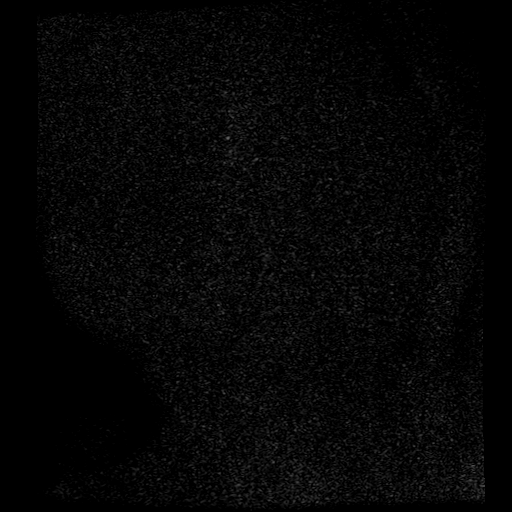
[im 241/432]
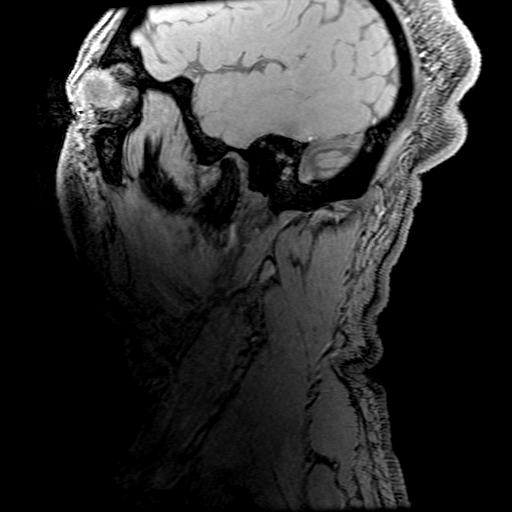
[im 305/432]
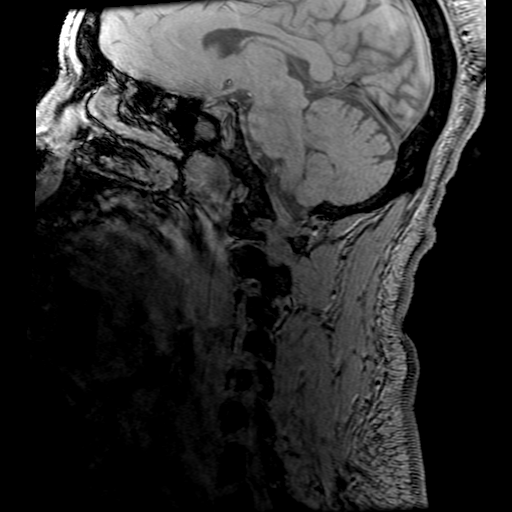
[im 355/432]
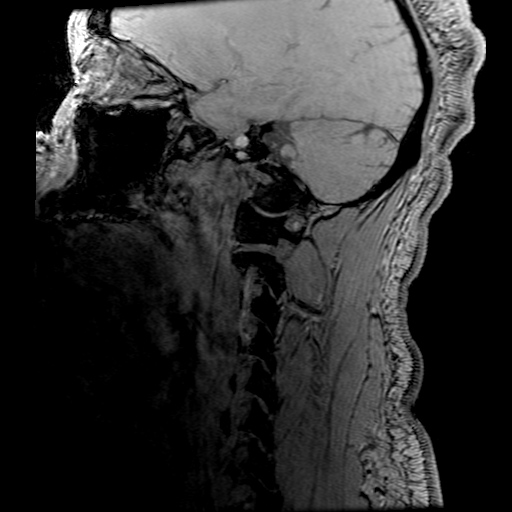
[im 368/432]
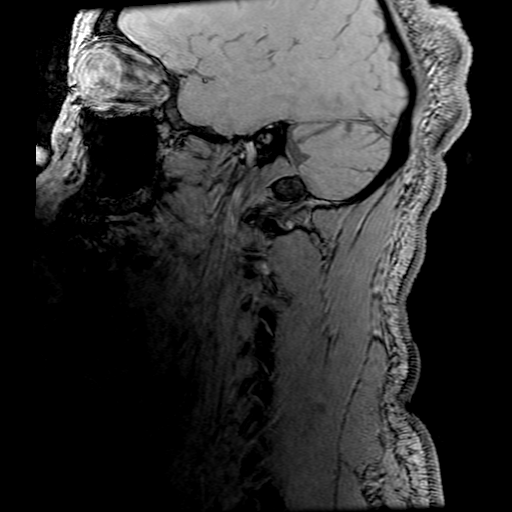
[im 406/432]
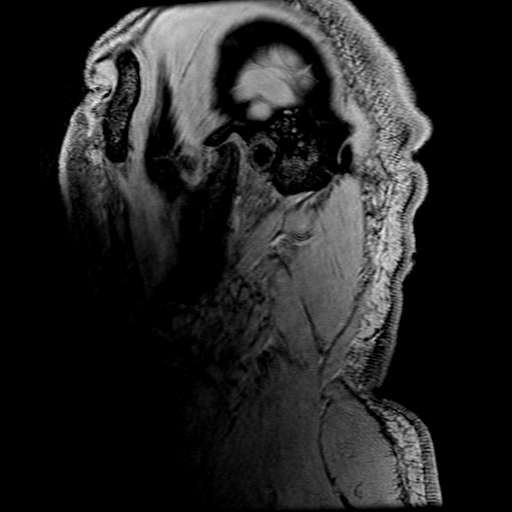

[19 of 48 positions shown; findings below may reference images not displayed]

FINDINGS: Aortic arch: Not included

Right carotid system: Included portions of the common carotids are
patent. Internal and external carotids are patent. No stenosis at
the ICA origins.

Left carotid system: Included portions of the common carotids are
patent. Internal and external carotids are patent. No stenosis at
the ICA origins.

Vertebral arteries: Included portions are patent and codominant. No
stenosis.

Other: None.
IMPRESSION: No large vessel occlusion or hemodynamically significant stenosis.

## 2022-02-08 IMAGING — MR MR HEAD W/O CM
6 of 10 series · 28 of 48 positions shown · non-contrast
Comparison: [DATE]

CLINICAL DATA: Neuro deficit, acute, stroke suspected

EXAM:
MRI HEAD WITHOUT CONTRAST
TECHNIQUE: Multiplanar, multiecho pulse sequences of the brain and surrounding
structures were obtained without intravenous contrast.

[Series 2: DWI · axial · 3.0mm · 0.94mm/px · z∈[-42,+103]mm · 8 of 100 slices shown (1 of 2)]
[im 1/100]
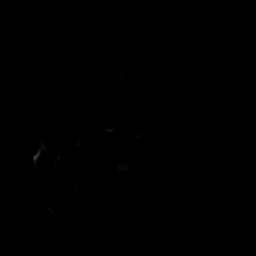
[im 12/100]
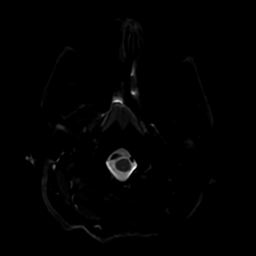
[im 34/100]
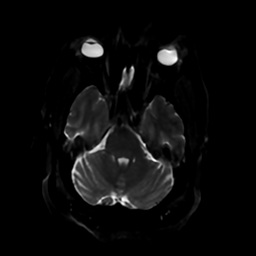
[im 45/100]
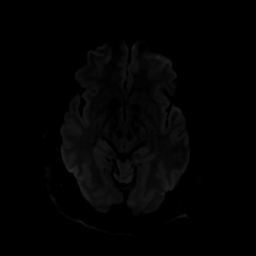
[im 56/100]
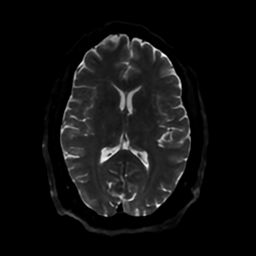
[im 67/100]
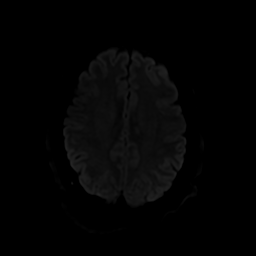
[im 89/100]
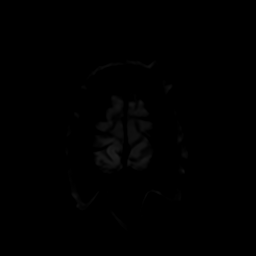
[im 100/100]
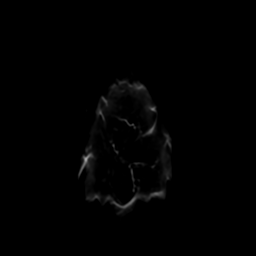

[Series 3: DWI · coronal · 4.0mm · 0.94mm/px · 8 of 74 slices shown (2 of 2)]
[im 1/74]
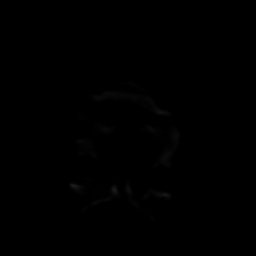
[im 11/74]
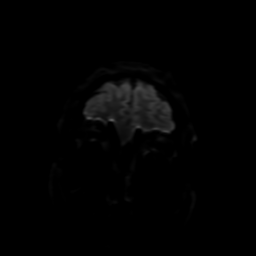
[im 21/74]
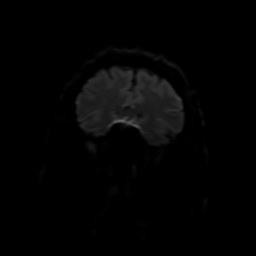
[im 32/74]
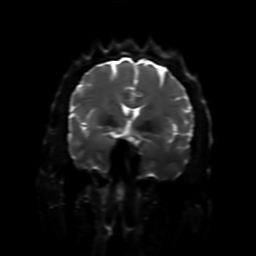
[im 42/74]
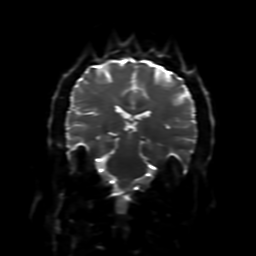
[im 53/74]
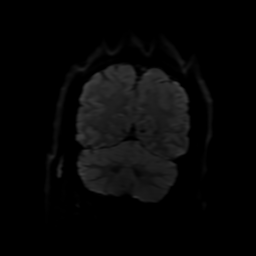
[im 63/74]
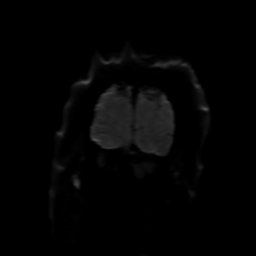
[im 74/74]
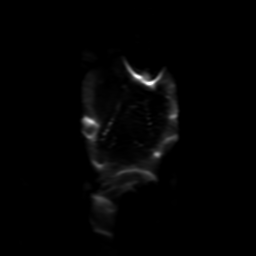

[Series 4: FLAIR · sagittal · 5.0mm · 0.23mm/px · 2 of 23 slices shown (1 of 2)]
[im 1/23]
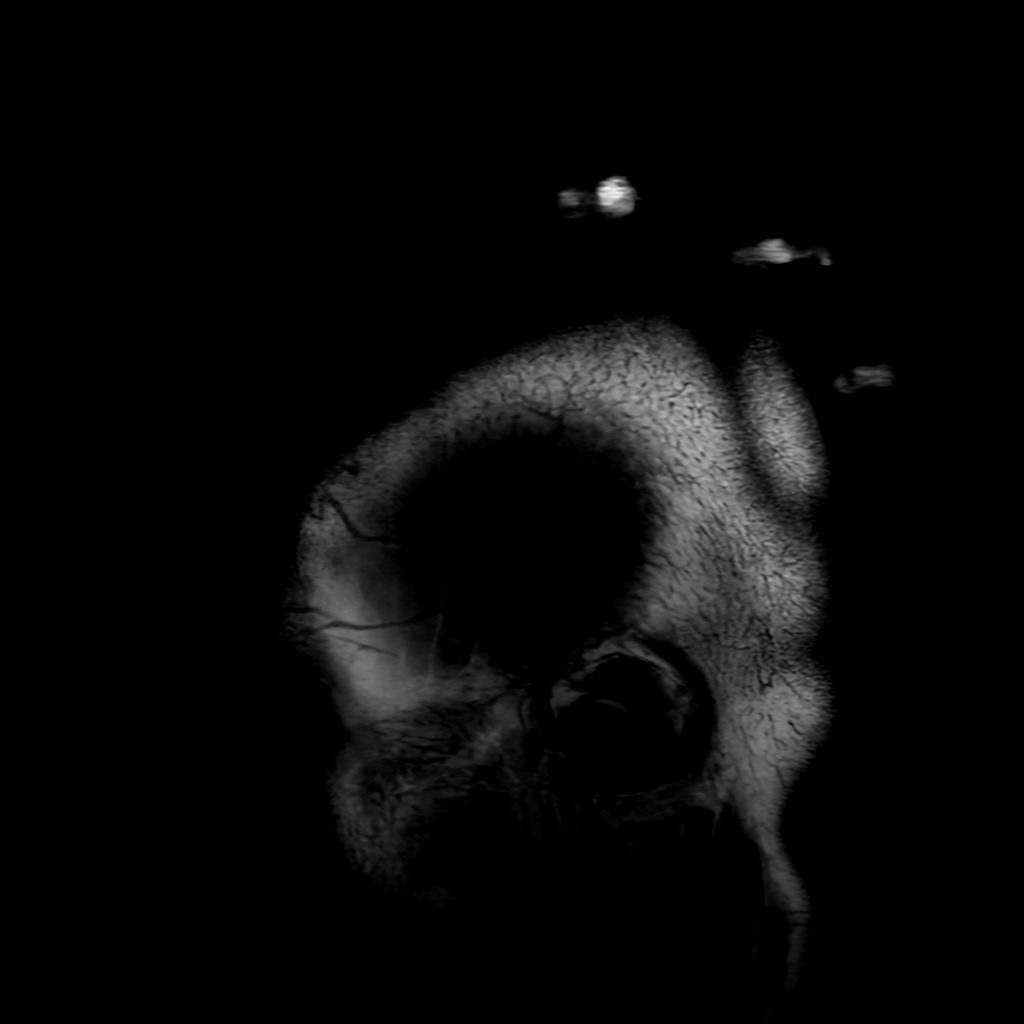
[im 23/23]
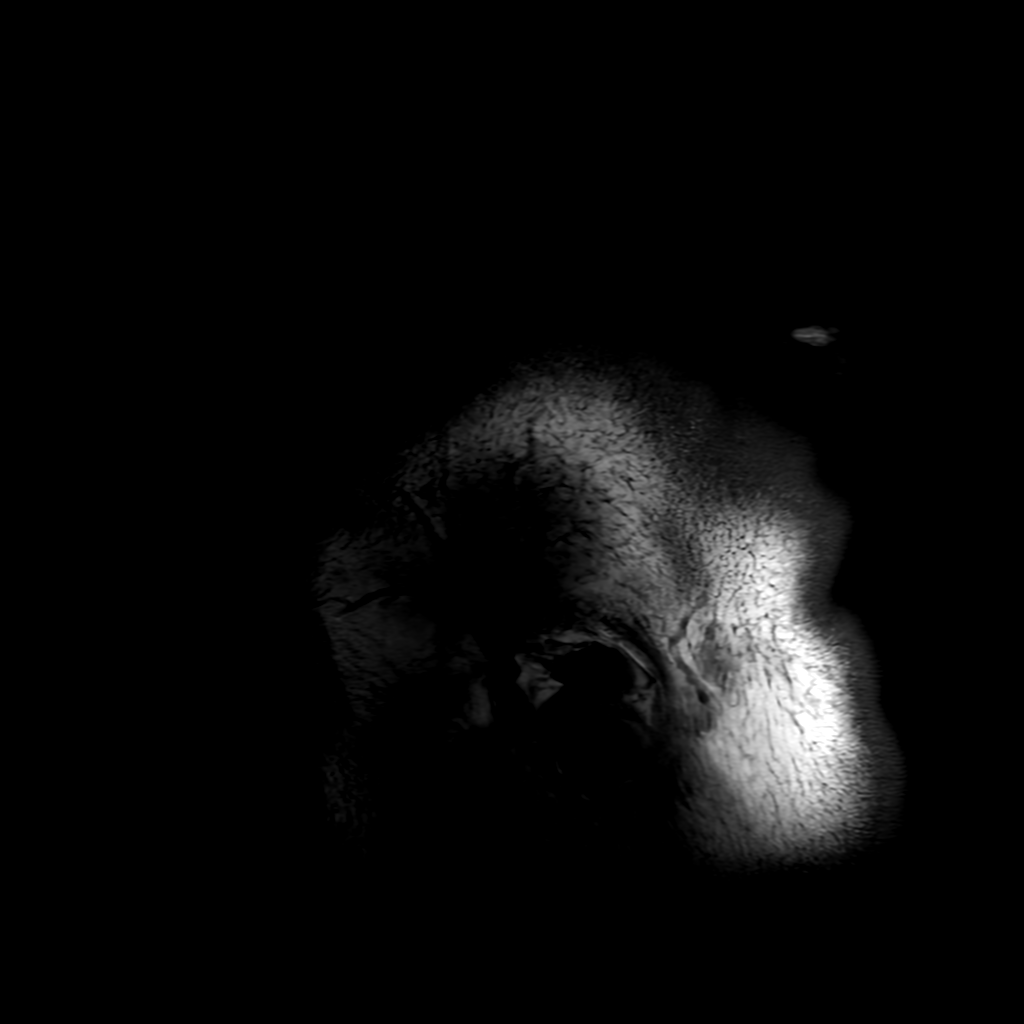

[Series 6: FLAIR · axial · 4.0mm · 0.45mm/px · z∈[-50,+78]mm · 3 of 31 slices shown (2 of 2)]
[im 1/31]
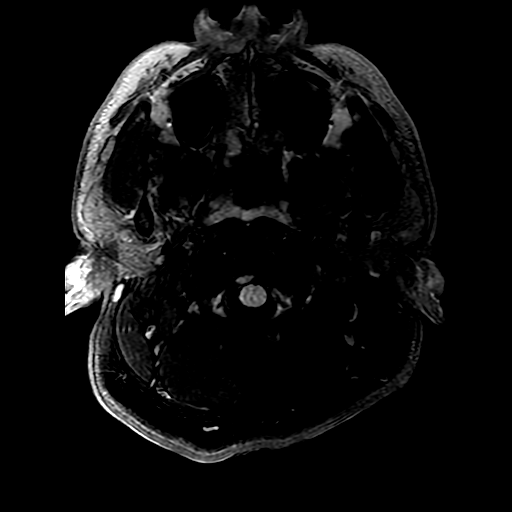
[im 16/31]
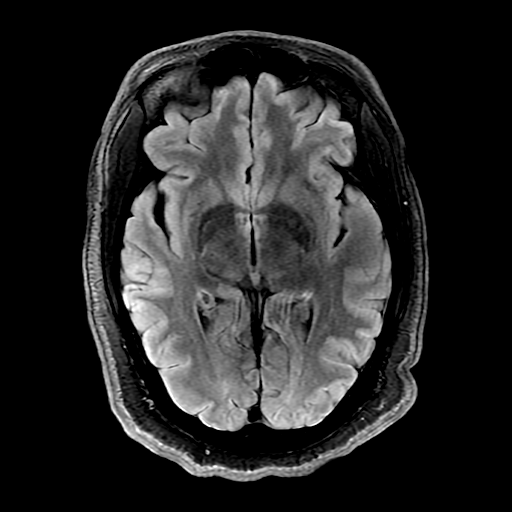
[im 31/31]
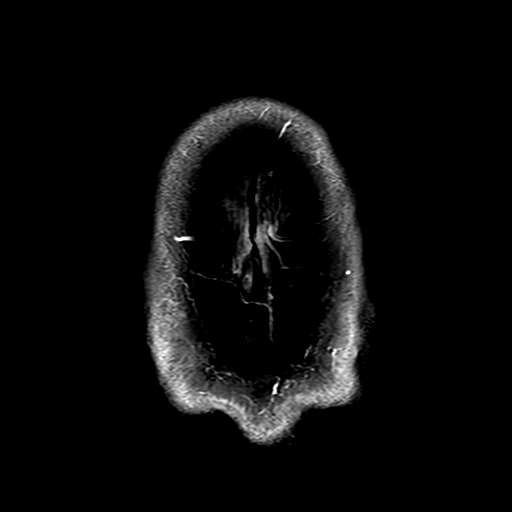

[Series 250: ADC · axial · 3.0mm · 0.94mm/px · z∈[-42,+103]mm · 4 of 48 slices shown (1 of 2)]
[im 1/48]
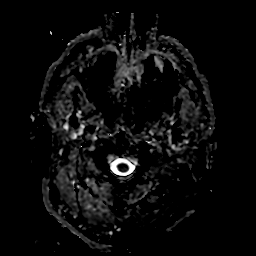
[im 16/48]
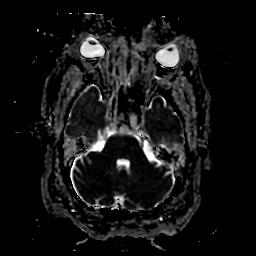
[im 32/48]
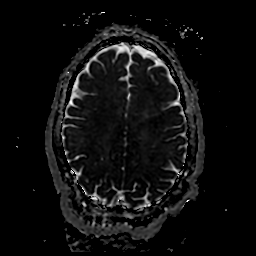
[im 48/48]
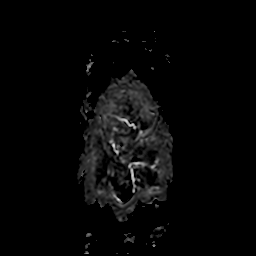

[Series 350: ADC · coronal · 4.0mm · 0.94mm/px · 3 of 37 slices shown (2 of 2)]
[im 1/37]
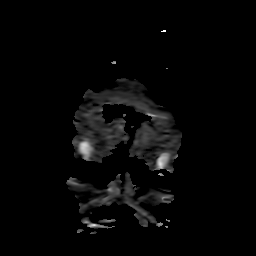
[im 19/37]
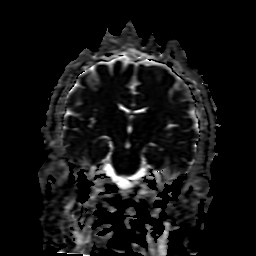
[im 37/37]
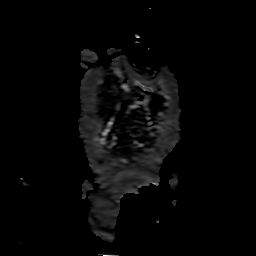

[28 of 48 positions shown; findings below may reference images not displayed]

FINDINGS: Brain: Punctate focus of diffusion hyperintensity is present in the
left parietal lobe likely involving the cortex (series 2, image 40).
No evidence of intracranial hemorrhage. There is no intracranial
mass, mass effect, or edema. There is no hydrocephalus or
extra-axial fluid collection. Ventricles and sulci are normal in
size and configuration. Small chronic cortical/subcortical left
parietal infarct.

Vascular: Major vessel flow voids at the skull base are preserved.

Skull and upper cervical spine: Normal marrow signal is preserved.

Sinuses/Orbits: Minor mucosal thickening.  Orbits are unremarkable.

Other: Sella is unremarkable. Bilateral mastoid effusions.
Prominence of the adenoids.
IMPRESSION: Punctate acute or subacute left parietal cortical infarct. No
hemorrhage or mass.

Small chronic left parietal cortical/subcortical infarct.

Bilateral mastoid effusions.  Prominent adenoids.

## 2022-02-08 IMAGING — CT CT HEAD W/O CM
4 series · 16 of 47 positions shown, 18 images · non-contrast
Comparison: Brain MRI [DATE], CT head [DATE]

CLINICAL DATA: Severe headache



[Series 3: head wo · axial · 0.42mm/px · z∈[-130,-10]mm · 7 of 32 slices shown, 9 images]
[im 4/32  brain]
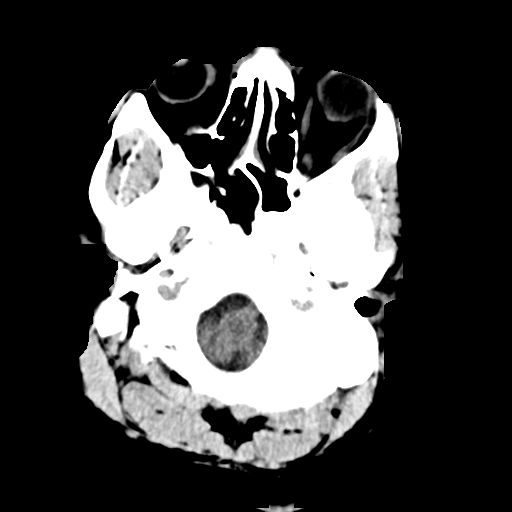
[im 4/32  bone]
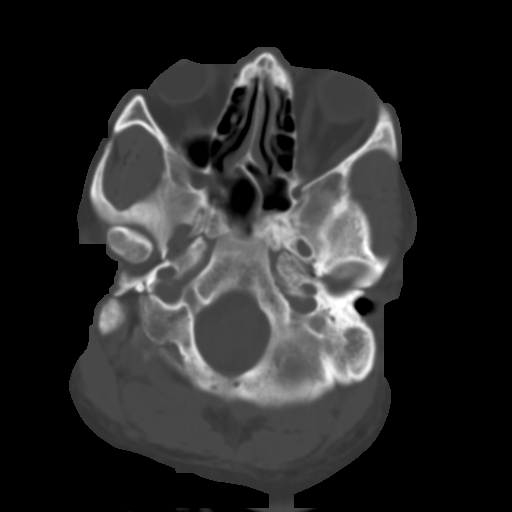
[im 8/32  brain]
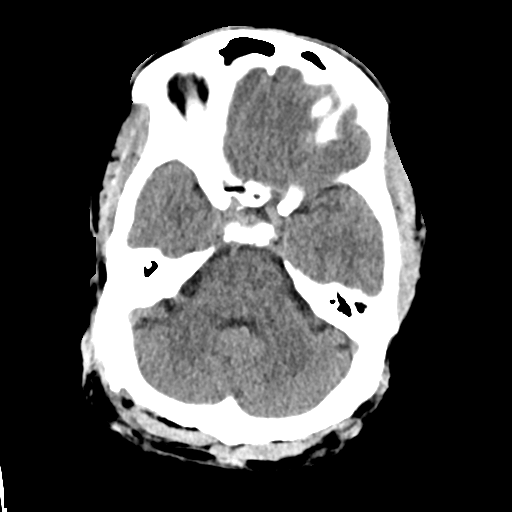
[im 12/32  brain]
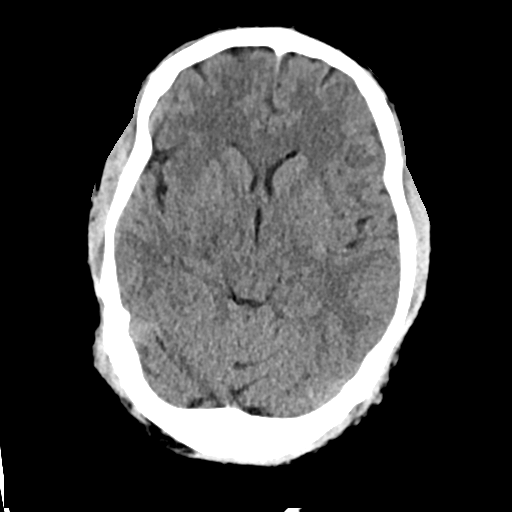
[im 16/32  brain]
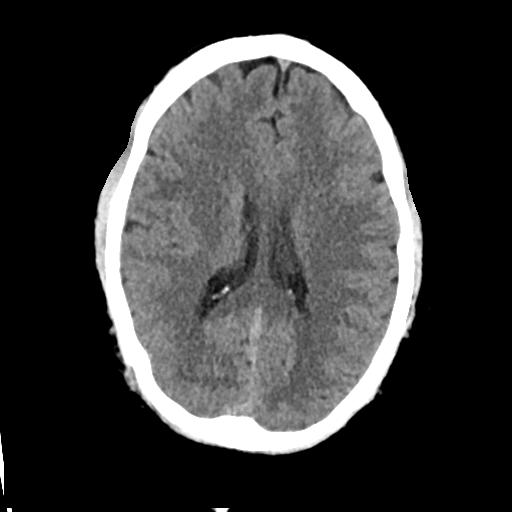
[im 20/32  brain]
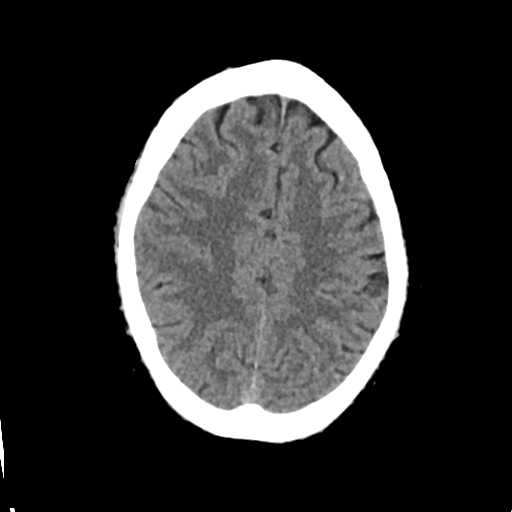
[im 20/32  bone]
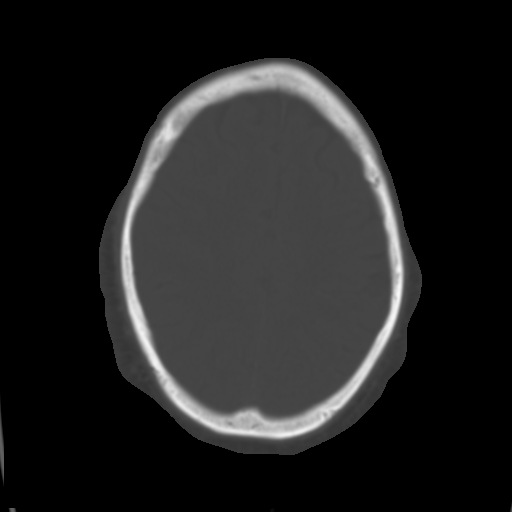
[im 24/32  brain]
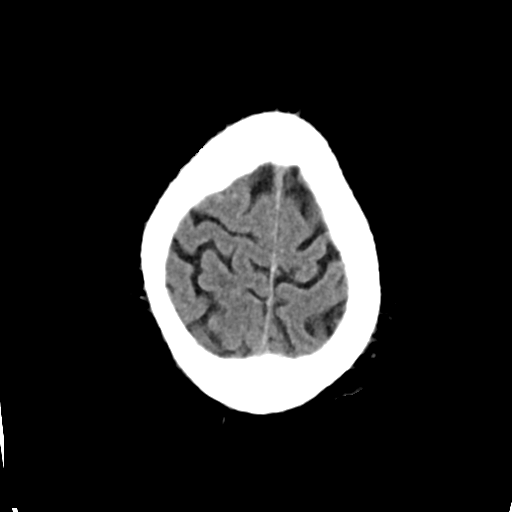
[im 28/32  brain]
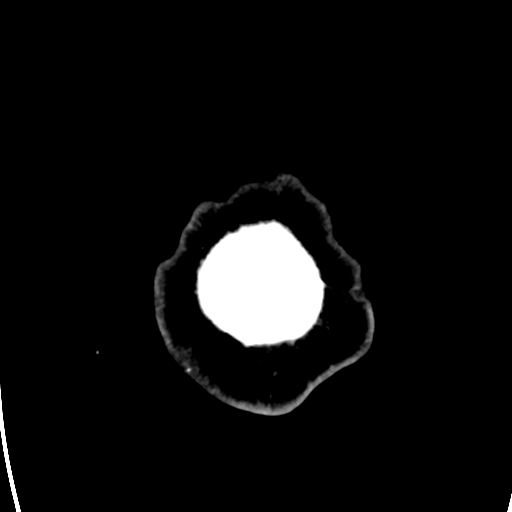

[Series 4: head bone · axial · 0.42mm/px · z∈[-131,-99]mm · 3 of 79 slices shown]
[im 8/79  bone]
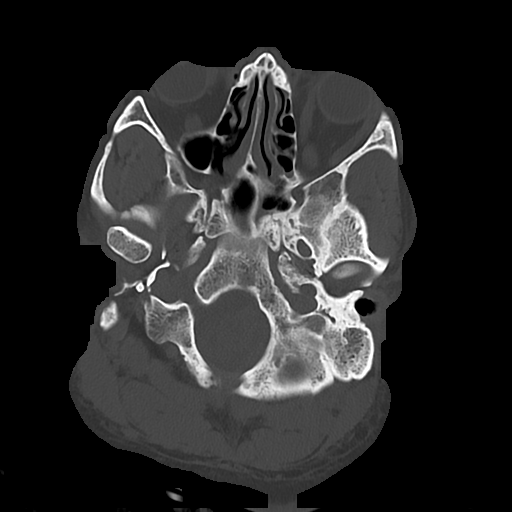
[im 16/79  bone]
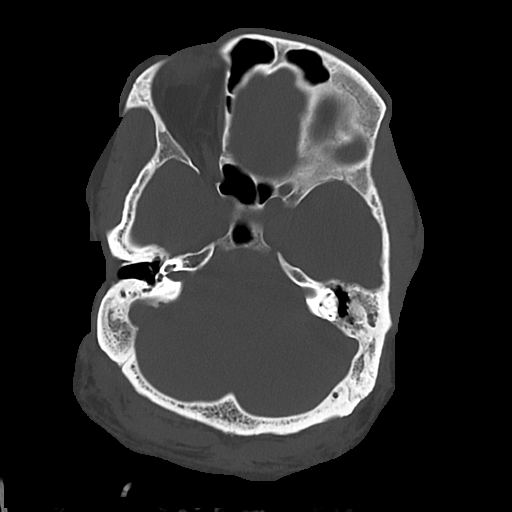
[im 24/79  bone]
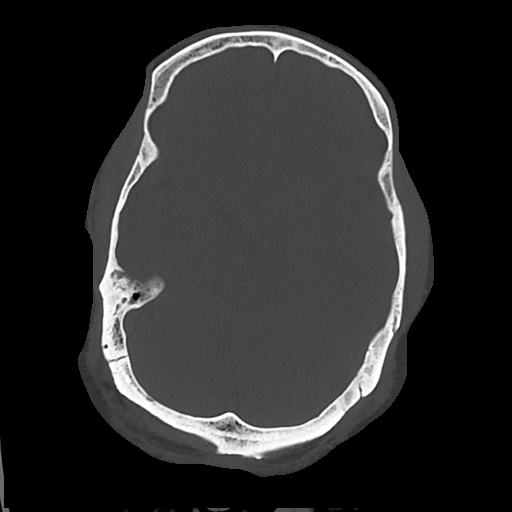

[Series 5: cor soft · coronal · 0.33mm/px · 3 of 73 slices shown]
[im 25/73  brain]
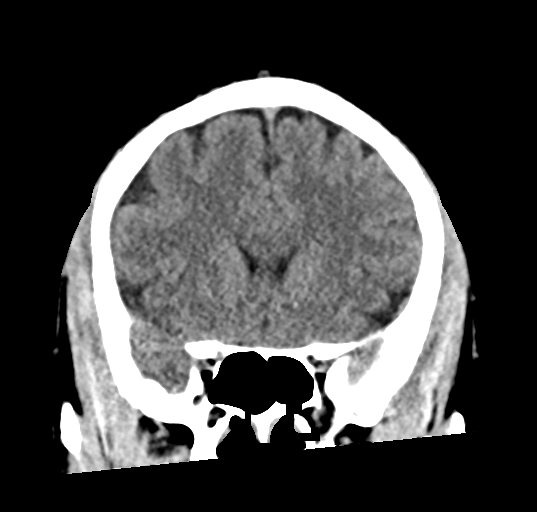
[im 33/73  brain]
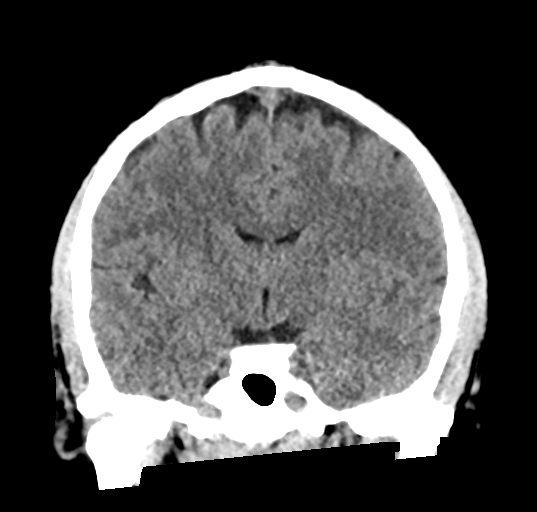
[im 41/73  brain]
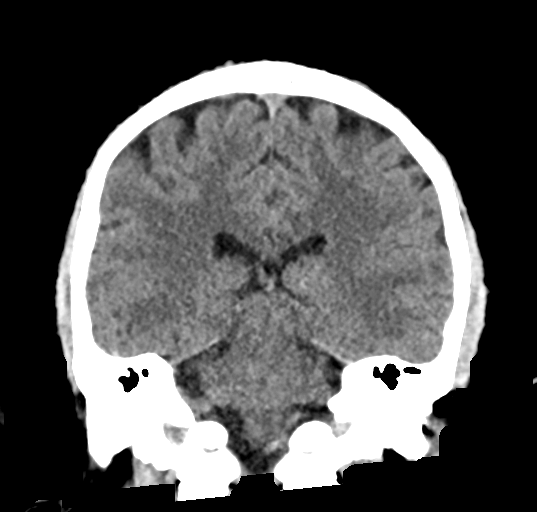

[Series 6: sag soft · sagittal · 0.32mm/px · 3 of 58 slices shown]
[im 20/58  brain]
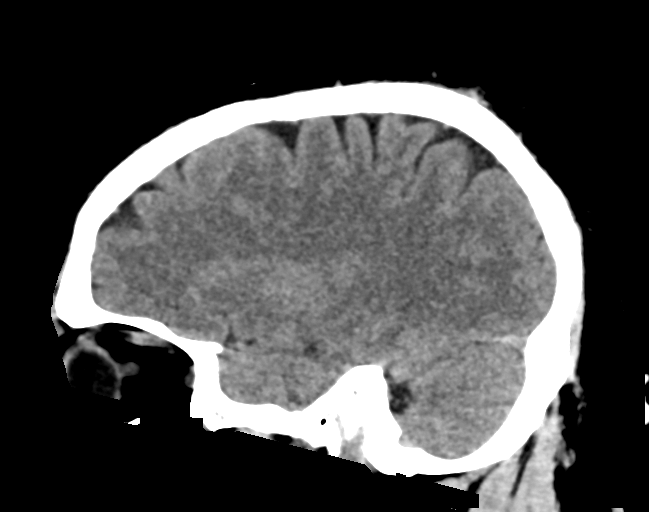
[im 29/58  brain]
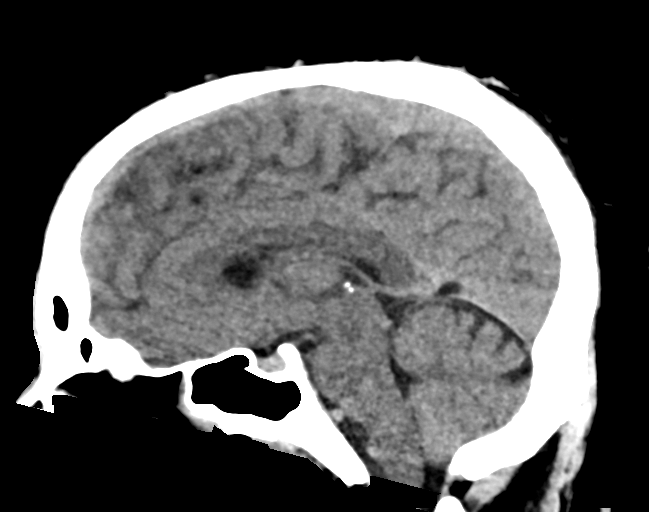
[im 39/58  brain]
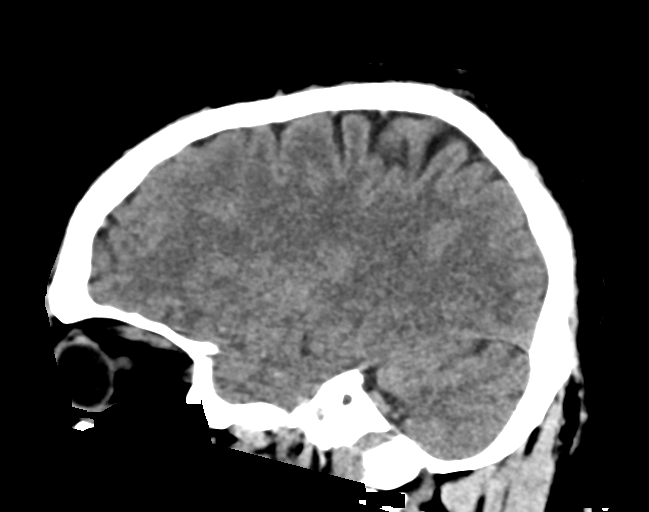

[16 of 47 positions shown; findings below may reference images not displayed]

FINDINGS: Brain: There is no evidence of acute intracranial hemorrhage,
extra-axial fluid collection, or acute infarct.

Parenchymal volume is normal. The ventricles are normal in size.
Gray-white differentiation is preserved. There is no mass lesion.
There is no midline shift.

Vascular: No hyperdense vessel or unexpected calcification.

Skull: Normal. Negative for fracture or focal lesion.

Sinuses/Orbits: There is mild mucosal thickening in the left
sphenoid sinus. The globes and orbits are unremarkable.

Other: There are bilateral mastoid effusions and a left middle ear
effusion, unchanged since [9U].
IMPRESSION: 1. No acute intracranial pathology. Normal noncontrast appearance of
the brain.
2. Unchanged bilateral mastoid and left middle ear effusions.

## 2022-02-08 IMAGING — CR DG CHEST 2V
2 series · 2 of 2 positions shown · non-contrast
Comparison: Chest radiograph [DATE]

CLINICAL DATA: Chest pain, right-sided numbness

EXAM:
CHEST - 2 VIEW

[chest lat]
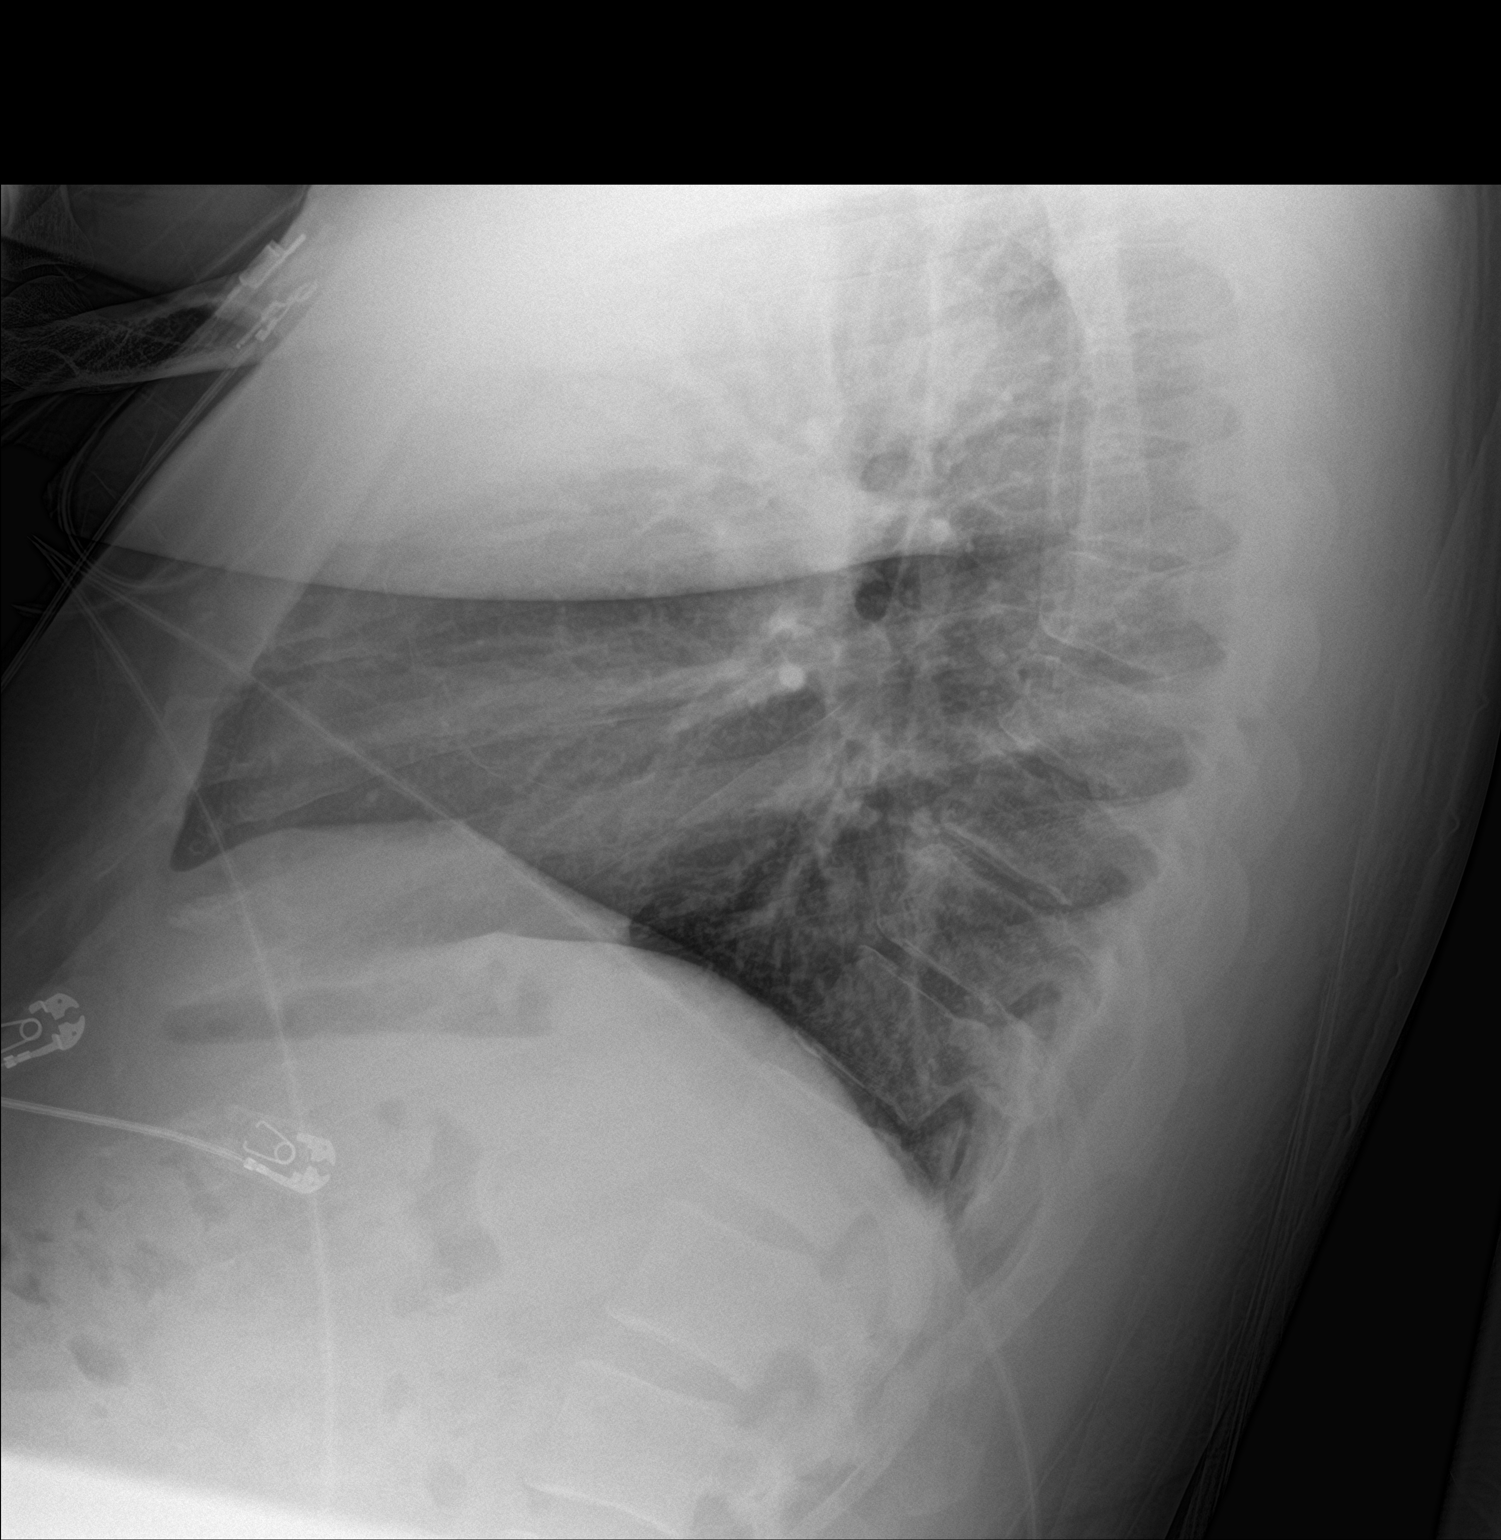

[chest ap]
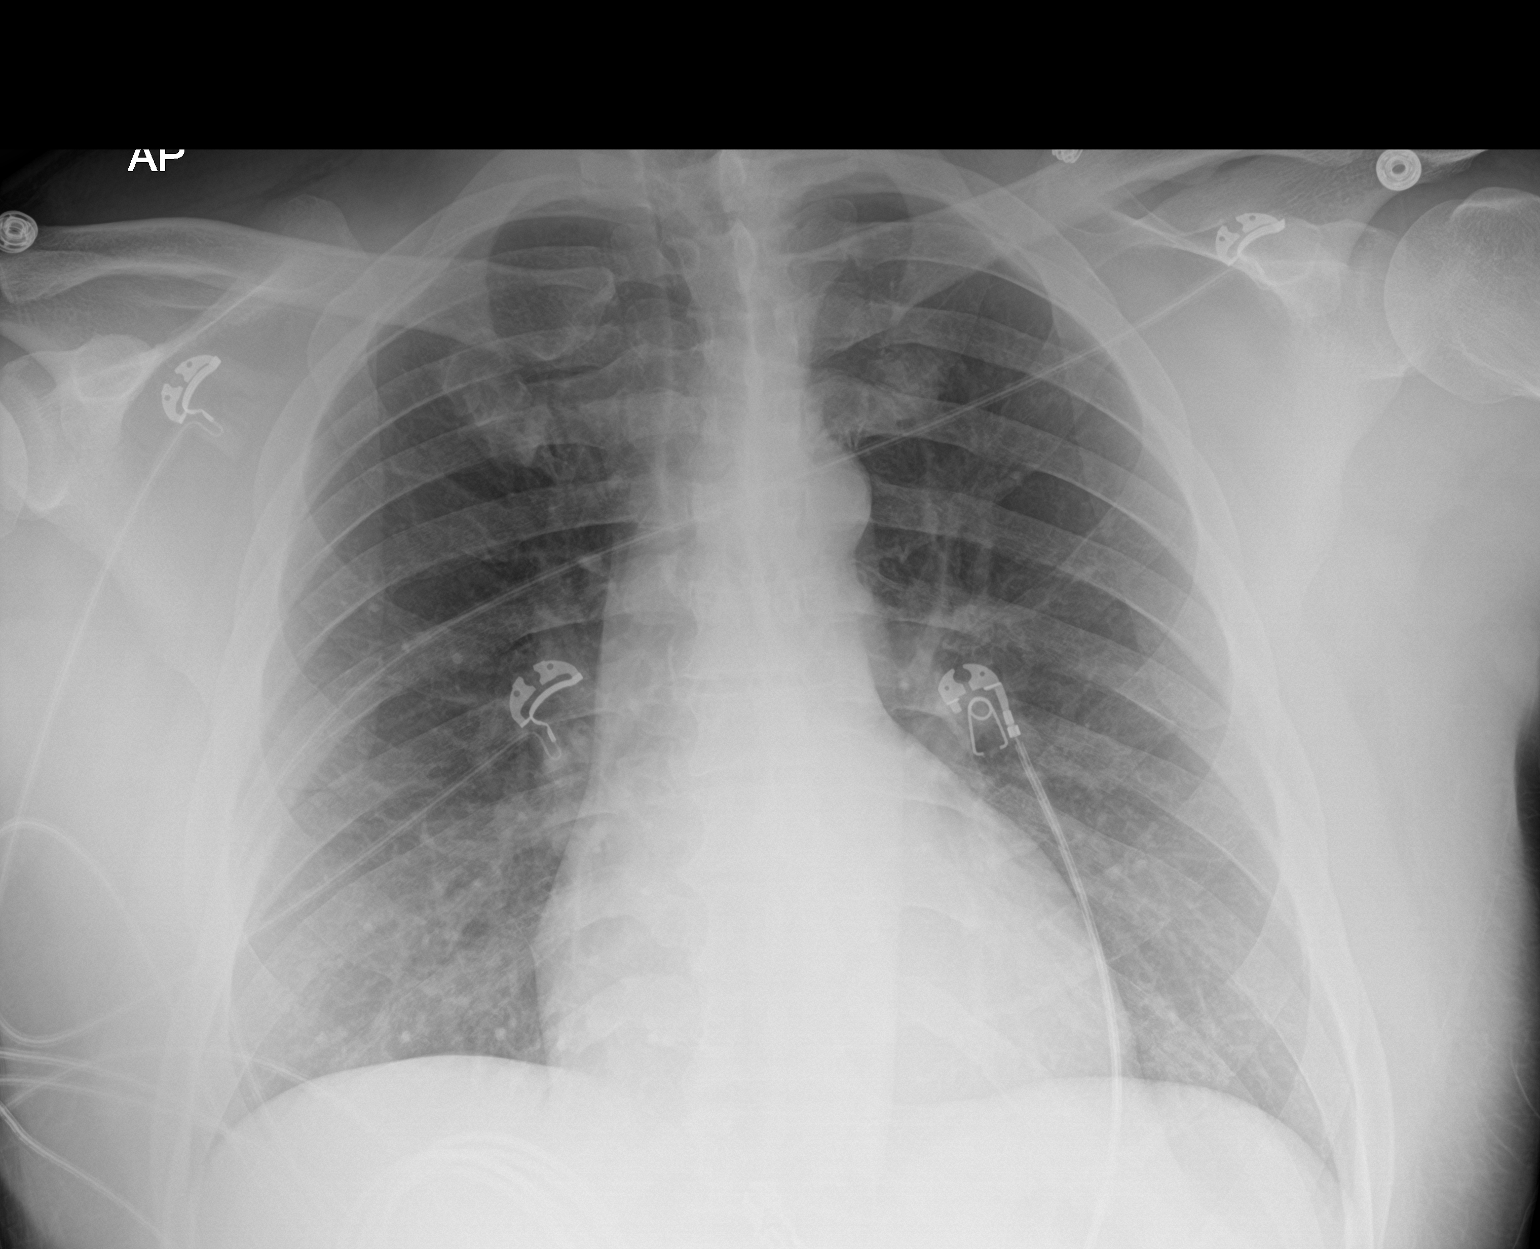

[2 of 2 positions shown; findings below may reference images not displayed]

FINDINGS: The cardiomediastinal silhouette is within normal limits.

There is no focal consolidation or pulmonary edema. There is no
pleural effusion or pneumothorax.

There is no acute osseous abnormality.
IMPRESSION: No radiographic evidence of acute cardiopulmonary process.

## 2022-02-08 IMAGING — MR MR MRA HEAD W/O CM
3 series · 19 of 48 positions shown · non-contrast
Comparison: Brain MRI [DATE].

CLINICAL DATA: Provided history: Stroke, follow-up.

EXAM:
MRA HEAD WITHOUT CONTRAST
TECHNIQUE: Angiographic images of the Circle of Willis were acquired using MRA
technique without intravenous contrast.

[Series 3: ax (id) · axial · 1.0mm · 0.43mm/px · z∈[-72,+7]mm · 15 of 176 slices shown]
[im 1/176]
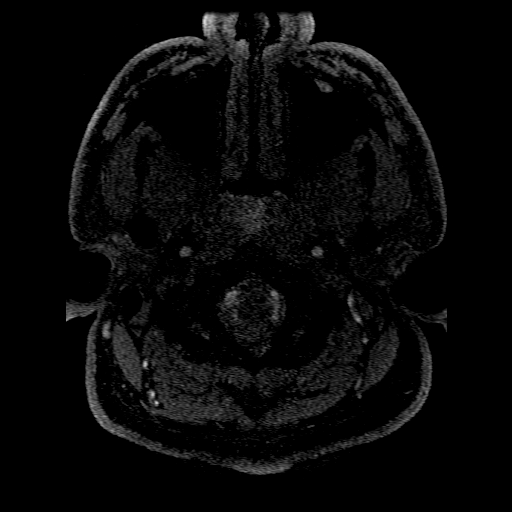
[im 5/176]
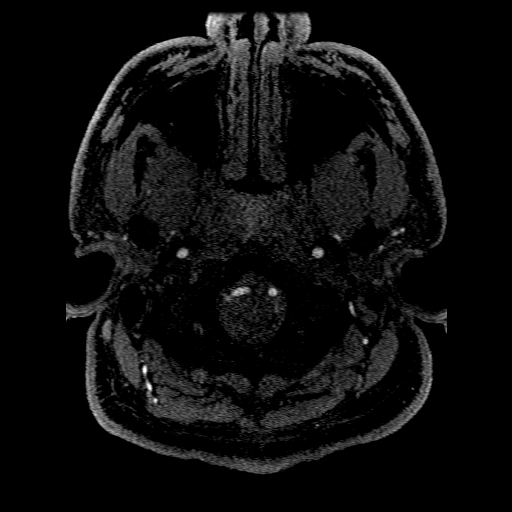
[im 9/176]
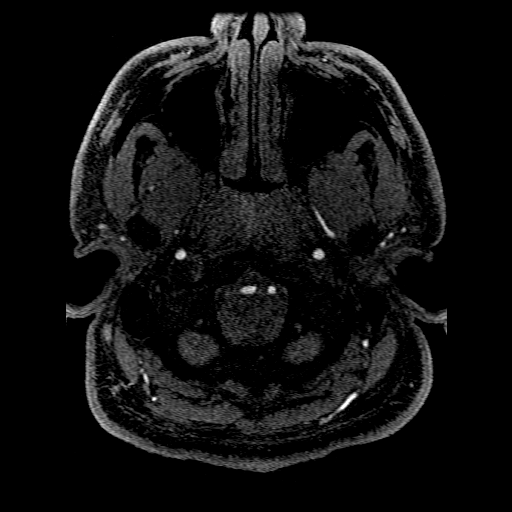
[im 13/176]
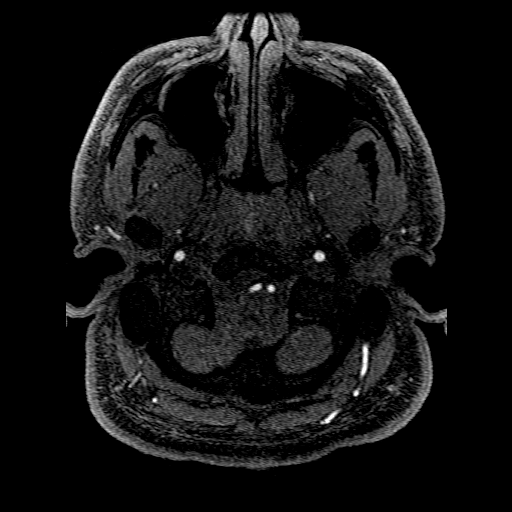
[im 17/176]
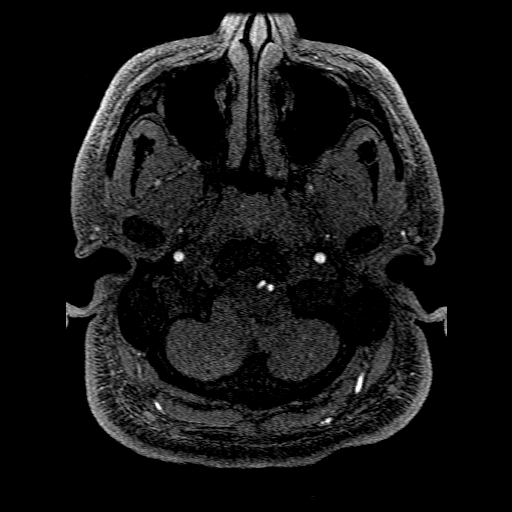
[im 29/176]
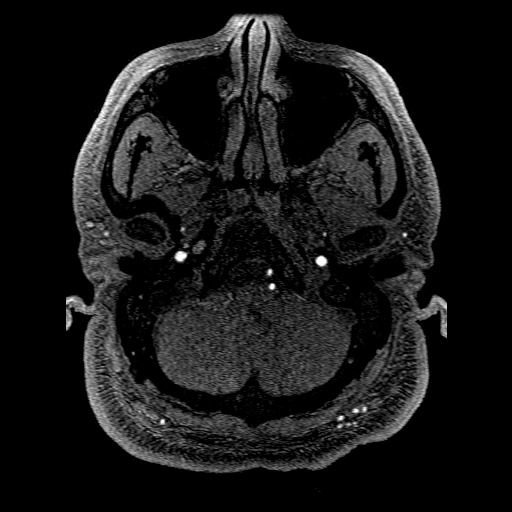
[im 33/176]
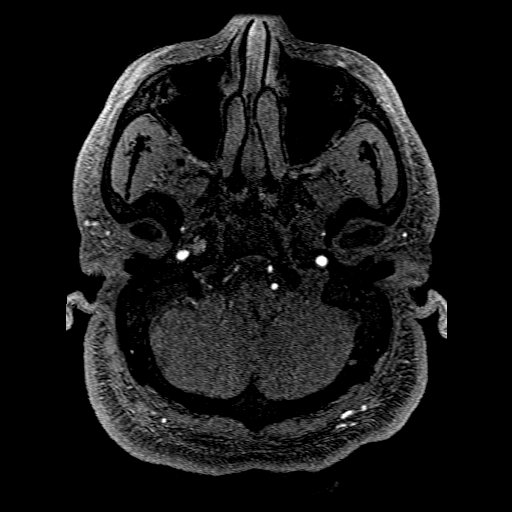
[im 53/176]
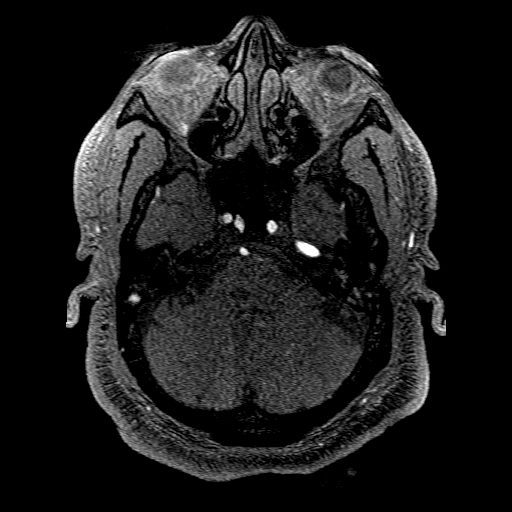
[im 78/176]
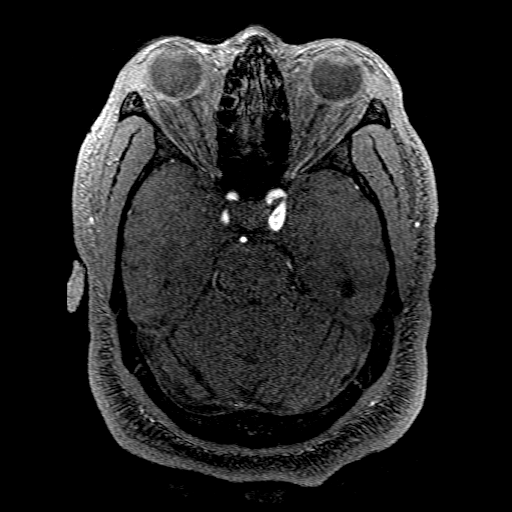
[im 90/176]
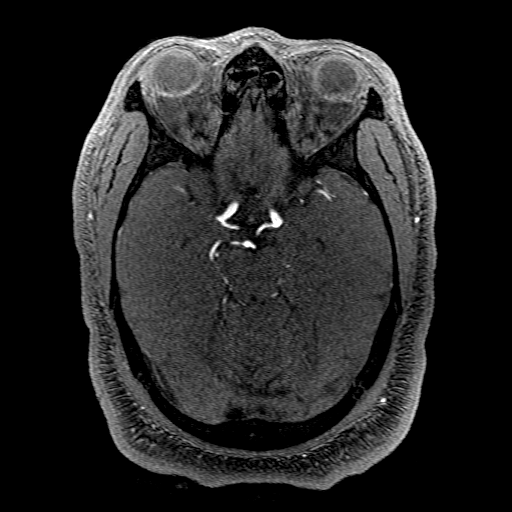
[im 98/176]
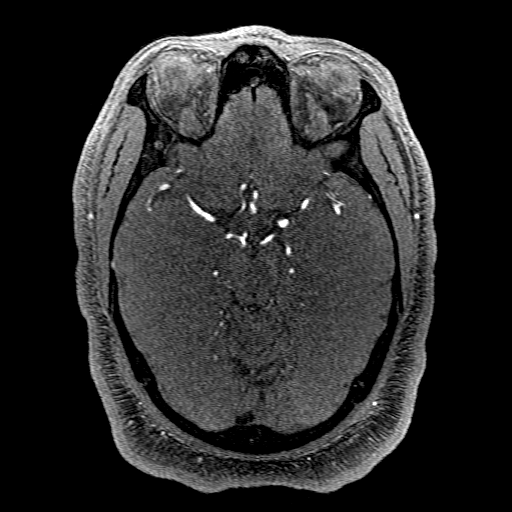
[im 123/176]
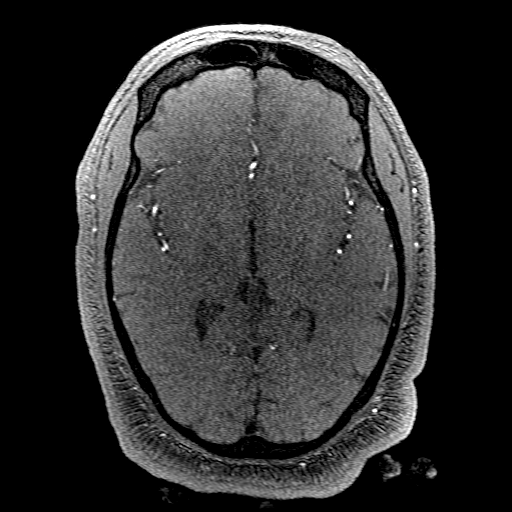
[im 143/176]
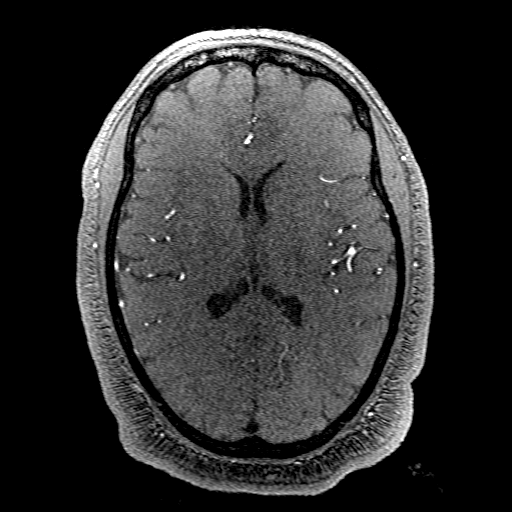
[im 147/176]
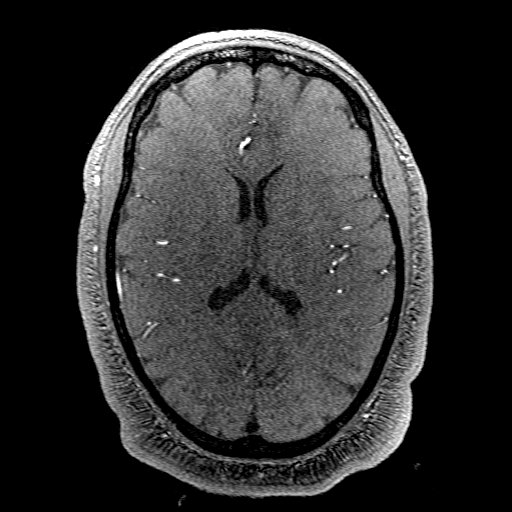
[im 167/176]
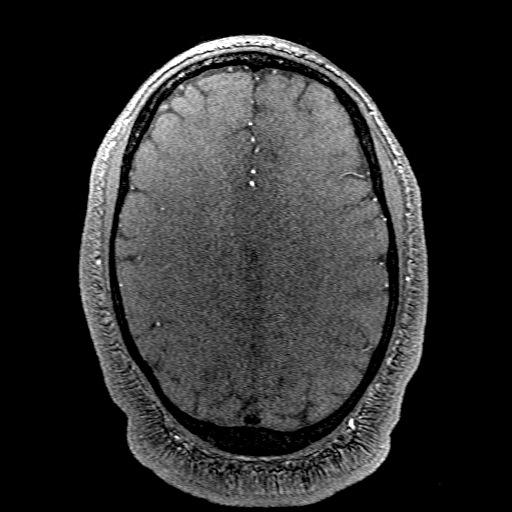

[Series 300: col:ax (id) · axial · 1.0mm · 0.43mm/px · 1 of 1 slices shown]
[im 1/1]
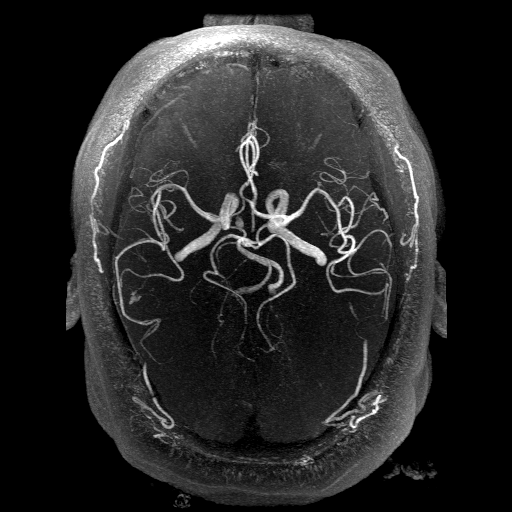

[Series 301: pjn:ax (id) · sagittal · 1.0mm · 0.43mm/px · 3 of 11 slices shown]
[im 1/11]
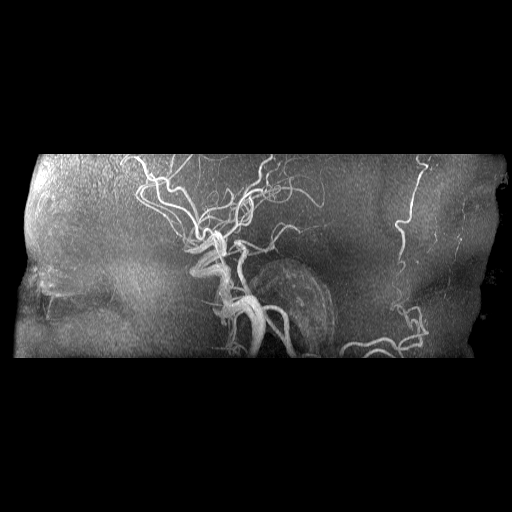
[im 6/11]
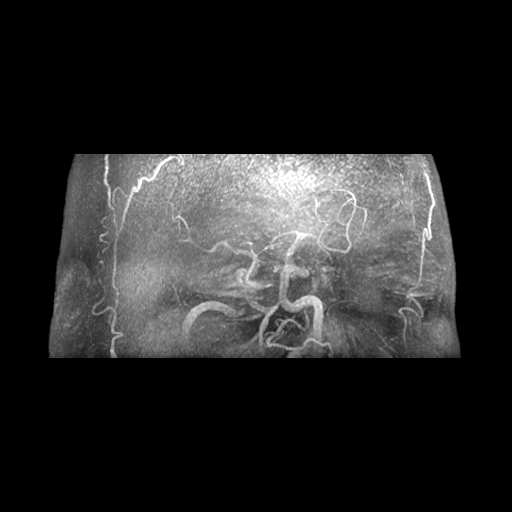
[im 11/11]
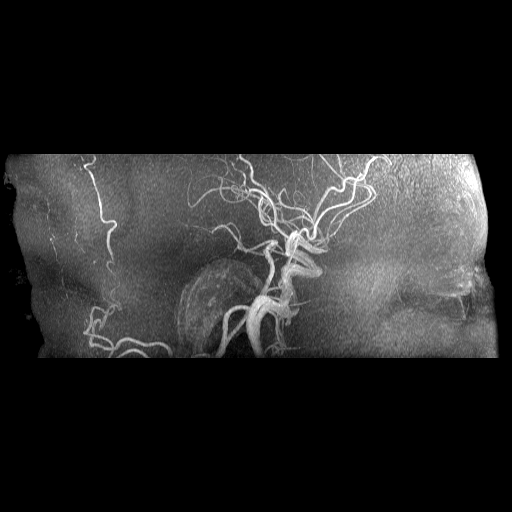

[19 of 48 positions shown; findings below may reference images not displayed]

FINDINGS: Anterior circulation:

The intracranial internal carotid arteries are patent. The M1 middle
cerebral arteries are patent. No M2 proximal branch occlusion or
high-grade proximal stenosis is identified. The anterior cerebral
arteries are patent. No intracranial aneurysm is identified.

Posterior circulation:

The intracranial vertebral arteries are patent. The basilar artery
is patent. The posterior cerebral arteries are patent. Both
posterior cerebral arteries demonstrate apparent distal branch
atherosclerotic irregularity. Posterior communicating arteries are
present bilaterally.

Anatomic variants: None significant.
IMPRESSION: No intracranial large vessel occlusion or proximal high-grade
arterial stenosis.

Both posterior cerebral arteries demonstrate apparent distal branch
atherosclerotic irregularity.

## 2022-02-08 MED ORDER — MUPIROCIN CALCIUM 2 % NA OINT
1.0000 "application " | TOPICAL_OINTMENT | Freq: Two times a day (BID) | NASAL | Status: DC
  Filled 2022-02-08: qty 1

## 2022-02-08 MED ORDER — SENNOSIDES-DOCUSATE SODIUM 8.6-50 MG PO TABS: 1.0000 | ORAL_TABLET | Freq: Every evening | ORAL | Status: DC | PRN

## 2022-02-08 MED ORDER — INSULIN ASPART 100 UNIT/ML IJ SOLN
0.0000 [IU] | Freq: Three times a day (TID) | INTRAMUSCULAR | Status: DC
  Administered 2022-02-08: 4 [IU] via SUBCUTANEOUS
  Administered 2022-02-09: 3 [IU] via SUBCUTANEOUS

## 2022-02-08 MED ORDER — ACETAMINOPHEN 650 MG RE SUPP: 650.0000 mg | RECTAL | Status: DC | PRN

## 2022-02-08 MED ORDER — IBUPROFEN 200 MG PO TABS
400.0000 mg | ORAL_TABLET | Freq: Four times a day (QID) | ORAL | Status: DC | PRN
  Administered 2022-02-09: 400 mg via ORAL
  Filled 2022-02-08: qty 2

## 2022-02-08 MED ORDER — ENOXAPARIN SODIUM 60 MG/0.6ML IJ SOSY
60.0000 mg | PREFILLED_SYRINGE | INTRAMUSCULAR | Status: DC
  Administered 2022-02-08 – 2022-02-09 (×2): 60 mg via SUBCUTANEOUS
  Filled 2022-02-08 (×3): qty 0.6

## 2022-02-08 MED ORDER — ACIDOPHILUS PROBIOTIC 10 MG PO CAPS: 10.0000 mg | ORAL_CAPSULE | Freq: Two times a day (BID) | ORAL | Status: DC | PRN

## 2022-02-08 MED ORDER — INSULIN ASPART 100 UNIT/ML IJ SOLN: 0.0000 [IU] | Freq: Every day | INTRAMUSCULAR | Status: DC

## 2022-02-08 MED ORDER — STROKE: EARLY STAGES OF RECOVERY BOOK
Freq: Once | Status: AC
  Filled 2022-02-08 (×2): qty 1

## 2022-02-08 MED ORDER — ASPIRIN 325 MG PO TABS
325.0000 mg | ORAL_TABLET | Freq: Every day | ORAL | Status: DC
  Administered 2022-02-08: 325 mg via ORAL
  Filled 2022-02-08: qty 1

## 2022-02-08 MED ORDER — MUPIROCIN 2 % EX OINT
TOPICAL_OINTMENT | Freq: Two times a day (BID) | CUTANEOUS | Status: DC
  Filled 2022-02-08 (×2): qty 22

## 2022-02-08 MED ORDER — BASAGLAR KWIKPEN 100 UNIT/ML ~~LOC~~ SOPN: 25.0000 [IU] | PEN_INJECTOR | Freq: Every day | SUBCUTANEOUS | Status: DC

## 2022-02-08 MED ORDER — INSULIN GLARGINE-YFGN 100 UNIT/ML ~~LOC~~ SOLN
25.0000 [IU] | Freq: Every day | SUBCUTANEOUS | Status: DC
  Administered 2022-02-08 – 2022-02-09 (×2): 25 [IU] via SUBCUTANEOUS
  Filled 2022-02-08 (×2): qty 0.25

## 2022-02-08 MED ORDER — ACETAMINOPHEN 325 MG PO TABS
650.0000 mg | ORAL_TABLET | ORAL | Status: DC | PRN
  Administered 2022-02-09: 650 mg via ORAL
  Filled 2022-02-08: qty 2

## 2022-02-08 MED ORDER — HYDRALAZINE HCL 25 MG PO TABS: 25.0000 mg | ORAL_TABLET | Freq: Four times a day (QID) | ORAL | Status: DC | PRN

## 2022-02-08 MED ORDER — ONDANSETRON HCL 4 MG/2ML IJ SOLN
4.0000 mg | Freq: Once | INTRAMUSCULAR | Status: AC
  Administered 2022-02-08: 4 mg via INTRAVENOUS
  Filled 2022-02-08: qty 2

## 2022-02-08 MED ORDER — NICOTINE 21 MG/24HR TD PT24
21.0000 mg | MEDICATED_PATCH | Freq: Every day | TRANSDERMAL | Status: DC
Start: 2022-02-08 — End: 2022-02-10
  Filled 2022-02-08: qty 1

## 2022-02-08 MED ORDER — SODIUM CHLORIDE 0.9 % IV BOLUS
1000.0000 mL | Freq: Once | INTRAVENOUS | Status: AC
  Administered 2022-02-08: 1000 mL via INTRAVENOUS

## 2022-02-08 MED ORDER — MORPHINE SULFATE (PF) 4 MG/ML IV SOLN
4.0000 mg | Freq: Once | INTRAVENOUS | Status: AC
  Administered 2022-02-08: 4 mg via INTRAVENOUS
  Filled 2022-02-08: qty 1

## 2022-02-08 MED ORDER — LINAGLIPTIN 5 MG PO TABS
5.0000 mg | ORAL_TABLET | Freq: Every day | ORAL | Status: DC
  Administered 2022-02-09: 5 mg via ORAL
  Filled 2022-02-08: qty 1

## 2022-02-08 MED ORDER — METFORMIN HCL 500 MG PO TABS
1000.0000 mg | ORAL_TABLET | Freq: Two times a day (BID) | ORAL | Status: DC
Start: 2022-02-08 — End: 2022-02-08

## 2022-02-08 MED ORDER — ACETAMINOPHEN 160 MG/5ML PO SOLN: 650.0000 mg | ORAL | Status: DC | PRN

## 2022-02-08 MED ORDER — FLUOXETINE HCL 20 MG PO CAPS
20.0000 mg | ORAL_CAPSULE | Freq: Every day | ORAL | Status: DC
  Administered 2022-02-09: 20 mg via ORAL
  Filled 2022-02-08: qty 1

## 2022-02-08 MED ORDER — LORAZEPAM 1 MG PO TABS
1.0000 mg | ORAL_TABLET | Freq: Four times a day (QID) | ORAL | Status: AC | PRN
  Administered 2022-02-08: 1 mg via ORAL
  Filled 2022-02-08: qty 1

## 2022-02-08 MED ORDER — LORAZEPAM 2 MG/ML IJ SOLN
1.0000 mg | Freq: Once | INTRAMUSCULAR | Status: AC | PRN
  Administered 2022-02-08: 1 mg via INTRAVENOUS
  Filled 2022-02-08: qty 1

## 2022-02-08 MED ORDER — RISAQUAD PO CAPS: 2.0000 | ORAL_CAPSULE | Freq: Every day | ORAL | Status: DC | PRN

## 2022-02-08 NOTE — Progress Notes (Signed)
Echocardiogram 2D Echocardiogram has been performed.  Arlyss Gandy 02/08/2022, 1:41 PM

## 2022-02-08 NOTE — ED Notes (Signed)
Patient transported to MRI 

## 2022-02-08 NOTE — ED Notes (Signed)
Pt took a Gas-X and muscle relaxant last night but it did not help. Pt has midsternum pain that radiates to the right side of chest. Pt states it feels like constant aching and right hand feels numbness and tingling. The pain is 10/10

## 2022-02-08 NOTE — H&P (Signed)
History and Physical    Roger Pennington UQJ:[id number] DOB: 01-24-1976 DOA: 02/08/2022  PCP: [facility] [place] (Confirm with patient/family/NH records and if not entered, this has to be entered at [facility] point of entry) Patient coming from: Home  I have personally briefly reviewed patient's old medical records in [place]  Chief Complaint: Right arm weak and tingling sensation  HPI: Roger Pennington is a 46 y.o. male with medical history significant of HTN, IDDM, anxiety/depression, cigarette smoker presented with persistent headache, new onset of chest pains and right arm numbness and weakness.   Patient started to have dull like headache about 2-3 days ago, global, denies any feeling of nauseous vomiting blurry vision.  He used OTC ibuprofen with some relief.  This morning, patient woke up with severe pressure-like chest pain, 8-9/10, radiating to right shoulder and his right 5 fingers started to feel tingling and weakness.  He could not work with right arm weakness and went to see onsite nurse who told him his blood pressure running high and sent him to ED.  He told me that he was just diagnosed with diabetes and hypertension since last year has been compliant with all his medications.  His most recent A1c= 7.0, and he takes amlodipine for high blood pressure and was told recently that his blood pressure running high, and he has been doing diet and lifestyle modification.    ED Course: Blood pressure 174/113, no tachycardia no hypoxia.  Brain MRI punctate acute or subacute left parietal cortical infarct, small chronic left parietal cortical/subcortical infarct.  Chest pain subsided after given morphine, blood pressure improved.  Troponin negative x2, EKG no acute ST changes.   Review of Systems: As per HPI otherwise 14 point review of systems negative.    Past Medical History:  Diagnosis Date   Diabetes mellitus without complication ([place])    Hypertension     Tobacco abuse     Past Surgical History:  Procedure Laterality Date   ESOPHAGOGASTRODUODENOSCOPY (EGD) WITH PROPOFOL N/A 02/09/2016   Procedure: ESOPHAGOGASTRODUODENOSCOPY (EGD) WITH PROPOFOL;  Surgeon: Lollie Sails, MD;  Location: [facility];  Service: Endoscopy;  Laterality: N/A;   INCISION AND DRAINAGE ABSCESS Right 05/25/2020   Procedure: INCISION AND DRAINAGE ABSCESS;  Surgeon: Jules Husbands, MD;  Location: [facility] ORS;  Service: General;  Laterality: Right;     reports that he has quit smoking. His smoking use included cigarettes. He has never used smokeless tobacco. He reports current alcohol use. He reports current drug use. Drug: Marijuana.  No Known Allergies  Family History  Problem Relation Age of Onset   Hypertension Father    Hypertension Brother      Prior to Admission medications   Medication Sig Start Date End Date Taking? Authorizing Provider  insulin glargine (LANTUS SOLOSTAR) 100 UNIT/ML Solostar Pen Inject 25 Units into the skin in the morning and at bedtime. 09/02/20  Yes [provider]  amLODipine (NORVASC) 5 MG tablet Take 5 mg by mouth daily.    [provider]  amLODipine (NORVASC) 5 MG tablet Take 1 tablet (5 mg total) by mouth daily. 09/02/20 11/01/20  Vladimir Crofts, MD  clotrimazole (LOTRIMIN) 1 % cream Apply 1 application topically 2 (two) times daily. 11/29/21   [provider]  FLUoxetine (PROZAC) 20 MG capsule Take 1 capsule (20 mg total) by mouth daily. 06/07/19   Clapacs, Madie Reno, MD  ibuprofen (ADVIL) 400 MG tablet Take 1 tablet (400 mg total) by  mouth every 6 (six) hours as needed. 06/24/20   Tylene Fantasia, PA-C  Insulin Glargine (BASAGLAR KWIKPEN) 100 UNIT/ML Inject 0.25 mLs (25 Units total) into the skin daily. 05/28/20 06/27/20  Max Sane, MD  Insulin Glargine [facility] KWIKPEN) 100 UNIT/ML Inject 25 Units into the skin daily. 09/02/20   Vladimir Crofts, MD  Insulin Pen Needle 32G X 4 MM MISC 90 Units by Does not  apply route 3 (three) times daily. 05/28/20   Max Sane, MD  Lactobacillus (ACIDOPHILUS PROBIOTIC) 10 MG CAPS Take 10 mg by mouth 2 (two) times daily as needed (GI upset). 06/02/20   [provider]  metFORMIN (GLUCOPHAGE) 1000 MG tablet Take 1,000 mg by mouth 2 (two) times daily with a meal.    [provider]  metFORMIN (GLUCOPHAGE) 1000 MG tablet Take 1 tablet (1,000 mg total) by mouth 2 (two) times daily with a meal. 09/02/20 11/01/20  Vladimir Crofts, MD  mupirocin nasal ointment (BACTROBAN NASAL) 2 % Place 1 application into the nose 2 (two) times daily. Use one-half of tube in each nostril twice daily for five (5) days. After application, press sides of nose together and gently massage. 07/09/20   Tylene Fantasia, PA-C  TRADJENTA 5 MG TABS tablet Take 5 mg by mouth daily. 05/01/20   [provider]    Physical Exam: Vitals:   02/08/22 0900 02/08/22 0930 02/08/22 1015 02/08/22 1150  BP: (!) 156/95 (!) 154/95 (!) 159/102 (!) 177/99  Pulse: 67 60 (!) 58 65  Resp: (!) 21 17 15 14   Temp:      TempSrc:      SpO2: 97% 98% 98% 99%  Weight:      Height:        Constitutional: NAD, calm, comfortable Vitals:   02/08/22 0900 02/08/22 0930 02/08/22 1015 02/08/22 1150  BP: (!) 156/95 (!) 154/95 (!) 159/102 (!) 177/99  Pulse: 67 60 (!) 58 65  Resp: (!) 21 17 15 14   Temp:      TempSrc:      SpO2: 97% 98% 98% 99%  Weight:      Height:       Eyes: PERRL, lids and conjunctivae normal ENMT: Mucous membranes are moist. Posterior pharynx clear of any exudate or lesions.Normal dentition.  Neck: normal, supple, no masses, no thyromegaly Respiratory: clear to auscultation bilaterally, no wheezing, no crackles. Normal respiratory effort. No accessory muscle use.  Cardiovascular: Regular rate and rhythm, no murmurs / rubs / gallops. No extremity edema. 2+ pedal pulses. No carotid bruits.  Abdomen: no tenderness, no masses palpated. No hepatosplenomegaly. Bowel sounds  positive.  Musculoskeletal: no clubbing / cyanosis. No joint deformity upper and lower extremities. Good ROM, no contractures. Normal muscle tone.  Skin: no rashes, lesions, ulcers. No induration Neurologic: CN 2-12 grossly intact.  Decreased right hand and 5 finger letter sensation Compared to left side, DTR normal. Strength 4/5 in right forearm, right hand and right 5 fingers compared to left side.  Psychiatric: Normal judgment and insight. Alert and oriented x 3. Normal mood.     Labs on Admission: I have personally reviewed following labs and imaging studies  CBC: Recent Labs  Lab 02/08/22 0810  WBC 5.3  HGB 12.9*  HCT 41.5  MCV 80.9  PLT 702   Basic Metabolic Panel: Recent Labs  Lab 02/08/22 0810  NA 135  K 4.0  CL 103  CO2 23  GLUCOSE 236*  BUN 8  CREATININE 1.04  CALCIUM 8.9  GFR: Estimated Creatinine Clearance: 117.5 mL/min (by C-G formula based on SCr of 1.04 mg/dL). Liver Function Tests: Recent Labs  Lab 02/08/22 0810  AST 26  ALT 27  ALKPHOS 60  BILITOT 0.6  PROT 7.2  ALBUMIN 4.1   No results for input(s): LIPASE, AMYLASE in the last 168 hours. No results for input(s): AMMONIA in the last 168 hours. Coagulation Profile: No results for input(s): INR, PROTIME in the last 168 hours. Cardiac Enzymes: No results for input(s): CKTOTAL, CKMB, CKMBINDEX, TROPONINI in the last 168 hours. BNP (last 3 results) No results for input(s): PROBNP in the last 8760 hours. HbA1C: No results for input(s): HGBA1C in the last 72 hours. CBG: Recent Labs  Lab 02/08/22 0846  GLUCAP 194*   Lipid Profile: No results for input(s): CHOL, HDL, LDLCALC, TRIG, CHOLHDL, LDLDIRECT in the last 72 hours. Thyroid Function Tests: No results for input(s): TSH, T4TOTAL, FREET4, T3FREE, THYROIDAB in the last 72 hours. Anemia Panel: No results for input(s): VITAMINB12, FOLATE, FERRITIN, TIBC, IRON, RETICCTPCT in the last 72 hours. Urine analysis:    Component Value Date/Time    COLORURINE YELLOW 02/08/2022 0940   APPEARANCEUR CLEAR 02/08/2022 0940   APPEARANCEUR Clear 04/16/2015 1415   LABSPEC 1.024 02/08/2022 0940   LABSPEC 1.025 04/16/2015 1415   PHURINE 6.0 02/08/2022 0940   GLUCOSEU 150 (A) 02/08/2022 0940   GLUCOSEU Negative 04/16/2015 1415   HGBUR NEGATIVE 02/08/2022 0940   BILIRUBINUR NEGATIVE 02/08/2022 0940   BILIRUBINUR Negative 04/16/2015 1415   KETONESUR NEGATIVE 02/08/2022 0940   PROTEINUR NEGATIVE 02/08/2022 0940   NITRITE NEGATIVE 02/08/2022 0940   LEUKOCYTESUR NEGATIVE 02/08/2022 0940   LEUKOCYTESUR Negative 04/16/2015 1415    Radiological Exams on Admission: DG Chest 2 View  Result Date: 02/08/2022 CLINICAL DATA:  Chest pain, right-sided numbness EXAM: CHEST - 2 VIEW COMPARISON:  Chest radiograph 05/24/2020 FINDINGS: The cardiomediastinal silhouette is within normal limits. There is no focal consolidation or pulmonary edema. There is no pleural effusion or pneumothorax. There is no acute osseous abnormality. IMPRESSION: No radiographic evidence of acute cardiopulmonary process. Electronically Signed   By: Valetta Mole M.D.   On: 02/08/2022 08:35   CT Head Wo Contrast  Result Date: 02/08/2022 CLINICAL DATA:  Severe headache EXAM: CT HEAD WITHOUT CONTRAST TECHNIQUE: Contiguous axial images were obtained from the base of the skull through the vertex without intravenous contrast. RADIATION DOSE REDUCTION: This exam was performed according to the departmental dose-optimization program which includes automated exposure control, adjustment of the mA and/or kV according to patient size and/or use of iterative reconstruction technique. COMPARISON:  Brain MRI 06/01/2021, CT head 06/01/2021 FINDINGS: Brain: There is no evidence of acute intracranial hemorrhage, extra-axial fluid collection, or acute infarct. Parenchymal volume is normal. The ventricles are normal in size. Gray-white differentiation is preserved. There is no mass lesion. There is no midline  shift. Vascular: No hyperdense vessel or unexpected calcification. Skull: Normal. Negative for fracture or focal lesion. Sinuses/Orbits: There is mild mucosal thickening in the left sphenoid sinus. The globes and orbits are unremarkable. Other: There are bilateral mastoid effusions and a left middle ear effusion, unchanged since 2022. IMPRESSION: 1. No acute intracranial pathology. Normal noncontrast appearance of the brain. 2. Unchanged bilateral mastoid and left middle ear effusions. Electronically Signed   By: Valetta Mole M.D.   On: 02/08/2022 09:24   MR BRAIN WO CONTRAST  Result Date: 02/08/2022 CLINICAL DATA:  Neuro deficit, acute, stroke suspected EXAM: MRI HEAD WITHOUT CONTRAST TECHNIQUE: Multiplanar, multiecho pulse  sequences of the brain and surrounding structures were obtained without intravenous contrast. COMPARISON:  06/01/2021 FINDINGS: Brain: Punctate focus of diffusion hyperintensity is present in the left parietal lobe likely involving the cortex (series 2, image 40). No evidence of intracranial hemorrhage. There is no intracranial mass, mass effect, or edema. There is no hydrocephalus or extra-axial fluid collection. Ventricles and sulci are normal in size and configuration. Small chronic cortical/subcortical left parietal infarct. Vascular: Major vessel flow voids at the skull base are preserved. Skull and upper cervical spine: Normal marrow signal is preserved. Sinuses/Orbits: Minor mucosal thickening.  Orbits are unremarkable. Other: Sella is unremarkable. Bilateral mastoid effusions. Prominence of the adenoids. IMPRESSION: Punctate acute or subacute left parietal cortical infarct. No hemorrhage or mass. Small chronic left parietal cortical/subcortical infarct. Bilateral mastoid effusions.  Prominent adenoids. Electronically Signed   By: Macy Mis M.D.   On: 02/08/2022 11:30   MR Cervical Spine Wo Contrast  Result Date: 02/08/2022 CLINICAL DATA:  Provided history: Cervical  radiculopathy, no red flags. Additional history provided by scanning technologist: Patient reports headache and chest pain since yesterday, headache for 3 days. EXAM: MRI CERVICAL SPINE WITHOUT CONTRAST TECHNIQUE: Multiplanar, multisequence MR imaging of the cervical spine was performed. No intravenous contrast was administered. COMPARISON:  Concurrently performed brain MRI 02/08/2022. FINDINGS: Mild intermittent motion degradation. Alignment: Straightening of the expected cervical lordosis. No significant spondylolisthesis. Vertebrae: Vertebral body height is maintained. No significant marrow edema or focal suspicious osseous lesion. Ventral osteophytes at C4-C5 and C5-C6. Cord: No signal abnormality identified within the cervical spinal cord. Posterior Fossa, vertebral arteries, paraspinal tissues: Posterior fossa better assessed on same-day brain MRI. Nonspecific prominence of the nasopharyngeal/adenoid soft tissues. Flow voids preserved within the imaged cervical vertebral arteries. Paraspinal soft tissues unremarkable. Disc levels: No more than mild disc degeneration within the cervical spine. C2-C3: No significant disc herniation or stenosis. C3-C4: No significant disc herniation or spinal canal stenosis. Left-sided uncovertebral hypertrophy results in mild to moderate left neural foraminal narrowing. C4-C5: No significant disc herniation or spinal canal stenosis. Left-sided uncovertebral hypertrophy results in mild left neural foraminal narrowing. C5-C6: No significant disc herniation or stenosis. C6-C7: No significant disc herniation or stenosis. C7-T1: No significant disc herniation or stenosis. IMPRESSION: Mildly motion degraded exam. Cervical spondylosis, as outlined. No significant spinal canal stenosis. Uncovertebral hypertrophy results in mild-to-moderate left neural foraminal narrowing at C3-C4, and mild left neural foraminal narrowing at C4-C5. No more than mild disc degeneration. Nonspecific  straightening of the expected cervical lordosis. Electronically Signed   By: Kellie Simmering D.O.   On: 02/08/2022 11:29    EKG: Independently reviewed. Sinus, incomplete RBBB  Assessment/Plan Principal Problem:   Stroke (cerebrum) ([place])  (please populate well all problems here in Problem List. (For example, if patient is on BP meds at home and you resume or decide to hold them, it is a problem that needs to be her. 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  Appears to also have chronic element probably silent stroke in the past. Will check MRA. -Likely secondary to uncontrolled hypertension -Suspect underlying metabolic syndrome -ASA [id number] -Check A1c and lipid panel -Allow permissive hypertension 200/110, as needed hydralazine -MRA of head and neck -Echo -UDS  Chest pain -Likely secondary to uncontrolled hypertension.  Troponin negative x2, EKG no acute findings, ACS ruled out. -Permissive hypertension, may need additional blood pressure meds to discharge. -Recommend outpatient cardiology follow-up for stress test. -Other  Ddx, pulses equal B/L, low suspicion for disection.  HTN, uncontrolled -As above.  New onset of headache -Global -May very much related to uncontrolled HTN -But will order a ESR and recommend outpatient PCP follow up.  IDDM with hyperglycemia -Continue Lantus 25 units daily -Sliding scale.  Cigarette smoke -Cessation consulted -Nicotine patch   DVT prophylaxis: Lovenox Code Status: Full code Family Communication: None at bedside Disposition Plan: Expect less than 2 midnight hospital stay Consults called: Neurology Admission status: Tele obs   Lequita Halt MD [facility] Pager [phone]  02/08/2022, 12:34 PM

## 2022-02-08 NOTE — ED Notes (Signed)
Per IP RN provided by purple man, she is not the RN for this patient.

## 2022-02-08 NOTE — ED Triage Notes (Signed)
Pt. Stated, ive had a headache and chest pain since yesterday and headache for 3 days.

## 2022-02-08 NOTE — ED Provider Notes (Signed)
[facility] EMERGENCY DEPARTMENT Provider Note   CSN: [id number] Arrival date & time: 02/08/22  0756     History  Chief Complaint  Patient presents with   Chest Pain   Headache    Roger Pennington is a 46 y.o. male.  Pt is a 46 yo aa male with a hx of htn and dm2 and tobacco abuse.  He said he's been compliant with his meds.  He presents to day with cp and h/a.  He did take his meds this am around 0445.  He went to work and then h/a and cp worsened.  So, he went to the work nurse and was sent to the ED.  He said his bp has been running high and his bs has also been elevated.  He denies f/c.  He does have right hand numbness and pain.  He said sx have been going on for 3 days.      Home Medications Prior to Admission medications   Medication Sig Start Date End Date Taking? Authorizing Provider  insulin glargine (LANTUS SOLOSTAR) 100 UNIT/ML Solostar Pen Inject 25 Units into the skin in the morning and at bedtime. 09/02/20  Yes [provider]  amLODipine (NORVASC) 5 MG tablet Take 5 mg by mouth daily.    [provider]  amLODipine (NORVASC) 5 MG tablet Take 1 tablet (5 mg total) by mouth daily. 09/02/20 11/01/20  Vladimir Crofts, MD  clotrimazole (LOTRIMIN) 1 % cream Apply 1 application topically 2 (two) times daily. 11/29/21   [provider]  FLUoxetine (PROZAC) 20 MG capsule Take 1 capsule (20 mg total) by mouth daily. 06/07/19   Clapacs, Madie Reno, MD  ibuprofen (ADVIL) 400 MG tablet Take 1 tablet (400 mg total) by mouth every 6 (six) hours as needed. 06/24/20   Tylene Fantasia, PA-C  Insulin Glargine (BASAGLAR KWIKPEN) 100 UNIT/ML Inject 0.25 mLs (25 Units total) into the skin daily. 05/28/20 06/27/20  Max Sane, MD  Insulin Glargine [facility] KWIKPEN) 100 UNIT/ML Inject 25 Units into the skin daily. 09/02/20   Vladimir Crofts, MD  Insulin Pen Needle 32G X 4 MM MISC 90 Units by Does not apply route 3 (three) times daily. 05/28/20   Max Sane,  MD  Lactobacillus (ACIDOPHILUS PROBIOTIC) 10 MG CAPS Take 10 mg by mouth 2 (two) times daily as needed (GI upset). 06/02/20   [provider]  metFORMIN (GLUCOPHAGE) 1000 MG tablet Take 1,000 mg by mouth 2 (two) times daily with a meal.    [provider]  metFORMIN (GLUCOPHAGE) 1000 MG tablet Take 1 tablet (1,000 mg total) by mouth 2 (two) times daily with a meal. 09/02/20 11/01/20  Vladimir Crofts, MD  mupirocin nasal ointment (BACTROBAN NASAL) 2 % Place 1 application into the nose 2 (two) times daily. Use one-half of tube in each nostril twice daily for five (5) days. After application, press sides of nose together and gently massage. 07/09/20   Tylene Fantasia, PA-C  TRADJENTA 5 MG TABS tablet Take 5 mg by mouth daily. 05/01/20   [provider]      Allergies    Patient has no known allergies.    Review of Systems   Review of Systems  Cardiovascular:  Positive for chest pain.  Neurological:  Positive for numbness and headaches.  All other systems reviewed and are negative.  Physical Exam Updated Vital Signs BP (!) 177/99    Pulse 65    Temp 98.8 F (37.1 C) (  Oral)    Resp 14    Ht 6' (1.829 m)    Wt 120.2 kg    SpO2 99%    BMI 35.94 kg/m  Physical Exam Vitals and nursing note reviewed.  Constitutional:      Appearance: He is well-developed.  HENT:     Head: Normocephalic and atraumatic.  Eyes:     Extraocular Movements: Extraocular movements intact.     Pupils: Pupils are equal, round, and reactive to light.  Cardiovascular:     Rate and Rhythm: Normal rate and regular rhythm.     Heart sounds: Normal heart sounds.  Pulmonary:     Effort: Pulmonary effort is normal.     Breath sounds: Normal breath sounds.  Abdominal:     General: Bowel sounds are normal.     Palpations: Abdomen is soft.  Musculoskeletal:        General: Normal range of motion.     Cervical back: Normal range of motion and neck supple.  Skin:    General: Skin is warm.      Capillary Refill: Capillary refill takes less than 2 seconds.  Neurological:     Mental Status: He is alert and oriented to person, place, and time.     Comments: Right arm weakness  Psychiatric:        Mood and Affect: Mood normal.        Behavior: Behavior normal.    ED Results / Procedures / Treatments   Labs (all labs ordered are listed, but only abnormal results are displayed) Labs Reviewed  BASIC METABOLIC PANEL - Abnormal; Notable for the following components:      Result Value   Glucose, Bld 236 (*)    All other components within normal limits  CBC - Abnormal; Notable for the following components:   Hemoglobin 12.9 (*)    MCH 25.1 (*)    All other components within normal limits  URINALYSIS, ROUTINE W REFLEX MICROSCOPIC - Abnormal; Notable for the following components:   Glucose, UA 150 (*)    All other components within normal limits  CBG MONITORING, ED - Abnormal; Notable for the following components:   Glucose-Capillary 194 (*)    All other components within normal limits  RESP PANEL BY RT-PCR (FLU A&B, COVID) ARPGX2  HEPATIC FUNCTION PANEL  RAPID URINE DRUG SCREEN, HOSP PERFORMED  HEMOGLOBIN A1C  TROPONIN I (HIGH SENSITIVITY)  TROPONIN I (HIGH SENSITIVITY)    EKG EKG Interpretation  Date/Time:  Wednesday February 08 2022 08:04:43 EST Ventricular Rate:  70 PR Interval:  144 QRS Duration: 112 QT Interval:  404 QTC Calculation: 436 R Axis:   33 Text Interpretation: Normal sinus rhythm Incomplete right bundle branch block Borderline ECG When compared with ECG of 02-Sep-2020 13:06, PREVIOUS ECG IS PRESENT No significant change since last tracing Confirmed by Isla Pence [phone]) on 02/08/2022 8:15:48 AM  Radiology DG Chest 2 View  Result Date: 02/08/2022 CLINICAL DATA:  Chest pain, right-sided numbness EXAM: CHEST - 2 VIEW COMPARISON:  Chest radiograph 05/24/2020 FINDINGS: The cardiomediastinal silhouette is within normal limits. There is no focal  consolidation or pulmonary edema. There is no pleural effusion or pneumothorax. There is no acute osseous abnormality. IMPRESSION: No radiographic evidence of acute cardiopulmonary process. Electronically Signed   By: Valetta Mole M.D.   On: 02/08/2022 08:35   CT Head Wo Contrast  Result Date: 02/08/2022 CLINICAL DATA:  Severe headache EXAM: CT HEAD WITHOUT CONTRAST TECHNIQUE: Contiguous axial images were obtained from  the base of the skull through the vertex without intravenous contrast. RADIATION DOSE REDUCTION: This exam was performed according to the departmental dose-optimization program which includes automated exposure control, adjustment of the mA and/or kV according to patient size and/or use of iterative reconstruction technique. COMPARISON:  Brain MRI 06/01/2021, CT head 06/01/2021 FINDINGS: Brain: There is no evidence of acute intracranial hemorrhage, extra-axial fluid collection, or acute infarct. Parenchymal volume is normal. The ventricles are normal in size. Gray-white differentiation is preserved. There is no mass lesion. There is no midline shift. Vascular: No hyperdense vessel or unexpected calcification. Skull: Normal. Negative for fracture or focal lesion. Sinuses/Orbits: There is mild mucosal thickening in the left sphenoid sinus. The globes and orbits are unremarkable. Other: There are bilateral mastoid effusions and a left middle ear effusion, unchanged since 2022. IMPRESSION: 1. No acute intracranial pathology. Normal noncontrast appearance of the brain. 2. Unchanged bilateral mastoid and left middle ear effusions. Electronically Signed   By: Valetta Mole M.D.   On: 02/08/2022 09:24   MR BRAIN WO CONTRAST  Result Date: 02/08/2022 CLINICAL DATA:  Neuro deficit, acute, stroke suspected EXAM: MRI HEAD WITHOUT CONTRAST TECHNIQUE: Multiplanar, multiecho pulse sequences of the brain and surrounding structures were obtained without intravenous contrast. COMPARISON:  06/01/2021 FINDINGS:  Brain: Punctate focus of diffusion hyperintensity is present in the left parietal lobe likely involving the cortex (series 2, image 40). No evidence of intracranial hemorrhage. There is no intracranial mass, mass effect, or edema. There is no hydrocephalus or extra-axial fluid collection. Ventricles and sulci are normal in size and configuration. Small chronic cortical/subcortical left parietal infarct. Vascular: Major vessel flow voids at the skull base are preserved. Skull and upper cervical spine: Normal marrow signal is preserved. Sinuses/Orbits: Minor mucosal thickening.  Orbits are unremarkable. Other: Sella is unremarkable. Bilateral mastoid effusions. Prominence of the adenoids. IMPRESSION: Punctate acute or subacute left parietal cortical infarct. No hemorrhage or mass. Small chronic left parietal cortical/subcortical infarct. Bilateral mastoid effusions.  Prominent adenoids. Electronically Signed   By: Macy Mis M.D.   On: 02/08/2022 11:30   MR Cervical Spine Wo Contrast  Result Date: 02/08/2022 CLINICAL DATA:  Provided history: Cervical radiculopathy, no red flags. Additional history provided by scanning technologist: Patient reports headache and chest pain since yesterday, headache for 3 days. EXAM: MRI CERVICAL SPINE WITHOUT CONTRAST TECHNIQUE: Multiplanar, multisequence MR imaging of the cervical spine was performed. No intravenous contrast was administered. COMPARISON:  Concurrently performed brain MRI 02/08/2022. FINDINGS: Mild intermittent motion degradation. Alignment: Straightening of the expected cervical lordosis. No significant spondylolisthesis. Vertebrae: Vertebral body height is maintained. No significant marrow edema or focal suspicious osseous lesion. Ventral osteophytes at C4-C5 and C5-C6. Cord: No signal abnormality identified within the cervical spinal cord. Posterior Fossa, vertebral arteries, paraspinal tissues: Posterior fossa better assessed on same-day brain MRI.  Nonspecific prominence of the nasopharyngeal/adenoid soft tissues. Flow voids preserved within the imaged cervical vertebral arteries. Paraspinal soft tissues unremarkable. Disc levels: No more than mild disc degeneration within the cervical spine. C2-C3: No significant disc herniation or stenosis. C3-C4: No significant disc herniation or spinal canal stenosis. Left-sided uncovertebral hypertrophy results in mild to moderate left neural foraminal narrowing. C4-C5: No significant disc herniation or spinal canal stenosis. Left-sided uncovertebral hypertrophy results in mild left neural foraminal narrowing. C5-C6: No significant disc herniation or stenosis. C6-C7: No significant disc herniation or stenosis. C7-T1: No significant disc herniation or stenosis. IMPRESSION: Mildly motion degraded exam. Cervical spondylosis, as outlined. No significant spinal canal stenosis. Uncovertebral  hypertrophy results in mild-to-moderate left neural foraminal narrowing at C3-C4, and mild left neural foraminal narrowing at C4-C5. No more than mild disc degeneration. Nonspecific straightening of the expected cervical lordosis. Electronically Signed   By: Kellie Simmering D.O.   On: 02/08/2022 11:29    Procedures Procedures    Medications Ordered in ED Medications  sodium chloride 0.9 % bolus 1,000 mL (1,000 mLs Intravenous New Bag/Given 02/08/22 [id number])  morphine (PF) 4 MG/ML injection 4 mg (4 mg Intravenous Given 02/08/22 0939)  ondansetron (ZOFRAN) injection 4 mg (4 mg Intravenous Given 02/08/22 0938)  LORazepam (ATIVAN) injection 1 mg (1 mg Intravenous Given 02/08/22 1017)    ED Course/ Medical Decision Making/ A&P                           Medical Decision Making Amount and/or Complexity of Data Reviewed Labs: ordered. Radiology: ordered.  Risk Prescription drug management. Decision regarding hospitalization.   This patient presents to the ED for concern of headache and chest pain, this involves an extensive number  of treatment options, and is a complaint that carries with it a high risk of complications and morbidity.  The differential diagnosis includes cardiac, pulmonary, gi, neurologic event   Co morbidities that complicate the patient evaluation  Htn, dm, tob abuse   Additional history obtained:  Additional history obtained from epic chart review  Lab Tests:  I Ordered, and personally interpreted labs.  The pertinent results include:  cbc is nl, bmp with elevated bs, troponin neg   Imaging Studies ordered:  I ordered imaging studies including cxr  I independently visualized and interpreted imaging which showed nl I agree with the radiologist interpretation   Cardiac Monitoring:  The patient was maintained on a cardiac monitor.  I personally viewed and interpreted the cardiac monitored which showed an underlying rhythm of: nsr   Medicines ordered and prescription drug management:  I ordered medication including morphine/zofran  for headache/nausea  Reevaluation of the patient after these medicines showed that the patient improved I have reviewed the patients home medicines and have made adjustments as needed   Test Considered:  2nd troponin.  Heart score of 3   Critical Interventions:  MRI ordered due to weakness to right arm.  MRI does show an acute stroke.   Consultations Obtained:  I requested consultation with the Dr. Melissa Montane (neurology),  and discussed lab and imaging findings as well as pertinent plan - they recommend: admission to medicine for stroke work up.  Pt also d/w Dr. Roosevelt Locks (triad) for admission.   Problem List / ED Course:  HTN:  BP has come down with rest and pain control, but is still slightly elevated.  Will hold off on additional meds as he's had a stroke. CP:  Atypical.  Troponins are neg.  CXR neg.  ? GERD.  Heart score of 3. Right arm weakness:  CVA noted on MRI.  Neurology consulted. Hyperglycemia:  Pt said he's been compliant with meds.  Hgb  A1c ordered.   Reevaluation:  After the interventions noted above, I reevaluated the patient and found that they have :improved   Dispostion:  After consideration of the diagnostic results and the patients response to treatment, I feel that the patent would benefit from admission for stroke work up.    CRITICAL CARE Performed by: Isla Pence   Total critical care time: 30 minutes  Critical care time was exclusive of separately billable procedures and treating  other patients.  Critical care was necessary to treat or prevent imminent or life-threatening deterioration.  Critical care was time spent personally by me on the following activities: development of treatment plan with patient and/or surrogate as well as nursing, discussions with consultants, evaluation of patient's response to treatment, examination of patient, obtaining history from patient or surrogate, ordering and performing treatments and interventions, ordering and review of laboratory studies, ordering and review of radiographic studies, pulse oximetry and re-evaluation of patient's condition.         Final Clinical Impression(s) / ED Diagnoses Final diagnoses:  Atypical chest pain  Cerebrovascular accident (CVA), unspecified mechanism ([place])  Tobacco abuse  Hyperglycemia due to diabetes mellitus [facility])    Rx / DC Orders ED Discharge Orders     None         Isla Pence, MD 02/08/22 1222

## 2022-02-08 NOTE — Consult Note (Signed)
NEURO HOSPITALIST CONSULT NOTE   Requesting physician: Dr. Particia Nearing  Reason for Consult: Acute onset of right sided weakness  History obtained from:  Patient and Chart     HPI:                                                                                                                                          Roger Pennington is an 46 y.o. male with a PMHx of DM, HTN and tobacco abuse who presents to the ED with new onset of right sided weakness and numbness. LKN was 10 PM yesterday. The patient woke up with the symptoms at 4 AM today. No prior history of stroke. Also with intermittent CP with radiation to RUE, as well as a 10/10 headache. He states that his CP started about 1 day ago and that his headache has been ongoing for 3 days.   Past Medical History:  Diagnosis Date   Diabetes mellitus without complication (HCC)    Hypertension    Tobacco abuse     Past Surgical History:  Procedure Laterality Date   ESOPHAGOGASTRODUODENOSCOPY (EGD) WITH PROPOFOL N/A 02/09/2016   Procedure: ESOPHAGOGASTRODUODENOSCOPY (EGD) WITH PROPOFOL;  Surgeon: Christena Deem, MD;  Location: [facility] ENDOSCOPY;  Service: Endoscopy;  Laterality: N/A;   INCISION AND DRAINAGE ABSCESS Right 05/25/2020   Procedure: INCISION AND DRAINAGE ABSCESS;  Surgeon: Leafy Ro, MD;  Location: [facility] ORS;  Service: General;  Laterality: Right;    Family History  Problem Relation Age of Onset   Hypertension Father    Hypertension Brother             Social History:  reports that he has quit smoking. His smoking use included cigarettes. He has never used smokeless tobacco. He reports current alcohol use. He reports current drug use. Drug: Marijuana.  No Known Allergies  HOME MEDICATIONS:                                                                                                                      No current facility-administered medications on file prior to encounter.   Current Outpatient  Medications on File Prior to Encounter  Medication Sig Dispense Refill   insulin glargine (LANTUS SOLOSTAR) 100 UNIT/ML Solostar Pen Inject 25 Units into  the skin in the morning and at bedtime.     amLODipine (NORVASC) 5 MG tablet Take 5 mg by mouth daily.     amLODipine (NORVASC) 5 MG tablet Take 1 tablet (5 mg total) by mouth daily. 30 tablet 1   clotrimazole (LOTRIMIN) 1 % cream Apply 1 application topically 2 (two) times daily.     FLUoxetine (PROZAC) 20 MG capsule Take 1 capsule (20 mg total) by mouth daily. 30 capsule 1   ibuprofen (ADVIL) 400 MG tablet Take 1 tablet (400 mg total) by mouth every 6 (six) hours as needed. 30 tablet 0   Insulin Glargine (BASAGLAR KWIKPEN) 100 UNIT/ML Inject 0.25 mLs (25 Units total) into the skin daily. 7.5 mL 0   Insulin Glargine (BASAGLAR KWIKPEN) 100 UNIT/ML Inject 25 Units into the skin daily. 15 mL 1   Insulin Pen Needle 32G X 4 MM MISC 90 Units by Does not apply route 3 (three) times daily. 90 each 10   Lactobacillus (ACIDOPHILUS PROBIOTIC) 10 MG CAPS Take 10 mg by mouth 2 (two) times daily as needed (GI upset).     metFORMIN (GLUCOPHAGE) 1000 MG tablet Take 1,000 mg by mouth 2 (two) times daily with a meal.     metFORMIN (GLUCOPHAGE) 1000 MG tablet Take 1 tablet (1,000 mg total) by mouth 2 (two) times daily with a meal. 60 tablet 1   mupirocin nasal ointment (BACTROBAN NASAL) 2 % Place 1 application into the nose 2 (two) times daily. Use one-half of tube in each nostril twice daily for five (5) days. After application, press sides of nose together and gently massage. 10 g 0   TRADJENTA 5 MG TABS tablet Take 5 mg by mouth daily.       ROS:                                                                                                                                       Headache is right sided and rated as 10/10. Has intermittent pressure-like CP with radiation to right shoulder and arm. No aphasia. No dysarthria. No new vision changes relative to his  baseline. No facial droop. No diarrhea. Other ROS as per HPI. Denies any other symptoms.   Blood pressure (!) 159/102, pulse (!) 58, temperature 98.8 F (37.1 C), temperature source Oral, resp. rate 15, height 6' (1.829 m), weight 120.2 kg, SpO2 98 %.   General Examination:  Physical Exam  HEENT-  [place]/AT   Lungs- Respirations unlabored Extremities- No edema  Neurological Examination Mental Status: Awake and alert. Oriented x 5. Speech fluent with intact comprehension and naming. No dysarthria.  Cranial Nerves: II: Temporal visual fields intact with no extinction to DSS. PERRL.   III,IV, VI: Subtle left ptosis. EOMI. No nystagmus.  V: Temp sensation equal bilaterally VII: No facial droop.  VIII: Hearing intact to voice IX,X: No hypophonia XI: Subtle lag on the right with shoulder shrug XII: Midline tongue extension Motor: RUE with subtle 5-/5 strength deficit relative to the left LUE 5/5 RLE with subtle 5-/5 strength deficit relative to the left LLE 5/5 Testing of Barre reveals subtle right parietal drift Sensory: Decreased temp sensation to RUE and RLE. No extinction to DSS.  Deep Tendon Reflexes: 1+ and symmetric throughout Plantars: Right: downgoing   Left: downgoing Cerebellar: No ataxia with FNF and H-S bilaterally Gait: Deferred   Lab Results: Basic Metabolic Panel: Recent Labs  Lab 02/08/22 0810  NA 135  K 4.0  CL 103  CO2 23  GLUCOSE 236*  BUN 8  CREATININE 1.04  CALCIUM 8.9    CBC: Recent Labs  Lab 02/08/22 0810  WBC 5.3  HGB 12.9*  HCT 41.5  MCV 80.9  PLT 212    Cardiac Enzymes: No results for input(s): CKTOTAL, CKMB, CKMBINDEX, TROPONINI in the last 168 hours.  Lipid Panel: No results for input(s): CHOL, TRIG, HDL, CHOLHDL, VLDL, LDLCALC in the last 168 hours.  Imaging: DG Chest 2 View  Result Date: 02/08/2022 CLINICAL DATA:  Chest pain,  right-sided numbness EXAM: CHEST - 2 VIEW COMPARISON:  Chest radiograph 05/24/2020 FINDINGS: The cardiomediastinal silhouette is within normal limits. There is no focal consolidation or pulmonary edema. There is no pleural effusion or pneumothorax. There is no acute osseous abnormality. IMPRESSION: No radiographic evidence of acute cardiopulmonary process. Electronically Signed   By: Lesia Hausen M.D.   On: 02/08/2022 08:35   CT Head Wo Contrast  Result Date: 02/08/2022 CLINICAL DATA:  Severe headache EXAM: CT HEAD WITHOUT CONTRAST TECHNIQUE: Contiguous axial images were obtained from the base of the skull through the vertex without intravenous contrast. RADIATION DOSE REDUCTION: This exam was performed according to the departmental dose-optimization program which includes automated exposure control, adjustment of the mA and/or kV according to patient size and/or use of iterative reconstruction technique. COMPARISON:  Brain MRI 06/01/2021, CT head 06/01/2021 FINDINGS: Brain: There is no evidence of acute intracranial hemorrhage, extra-axial fluid collection, or acute infarct. Parenchymal volume is normal. The ventricles are normal in size. Gray-white differentiation is preserved. There is no mass lesion. There is no midline shift. Vascular: No hyperdense vessel or unexpected calcification. Skull: Normal. Negative for fracture or focal lesion. Sinuses/Orbits: There is mild mucosal thickening in the left sphenoid sinus. The globes and orbits are unremarkable. Other: There are bilateral mastoid effusions and a left middle ear effusion, unchanged since 2022. IMPRESSION: 1. No acute intracranial pathology. Normal noncontrast appearance of the brain. 2. Unchanged bilateral mastoid and left middle ear effusions. Electronically Signed   By: Lesia Hausen M.D.   On: 02/08/2022 09:24   MR BRAIN WO CONTRAST  Result Date: 02/08/2022 CLINICAL DATA:  Neuro deficit, acute, stroke suspected EXAM: MRI HEAD WITHOUT CONTRAST  TECHNIQUE: Multiplanar, multiecho pulse sequences of the brain and surrounding structures were obtained without intravenous contrast. COMPARISON:  06/01/2021 FINDINGS: Brain: Punctate focus of diffusion hyperintensity is present in the left parietal lobe likely involving the cortex (  series 2, image 40). No evidence of intracranial hemorrhage. There is no intracranial mass, mass effect, or edema. There is no hydrocephalus or extra-axial fluid collection. Ventricles and sulci are normal in size and configuration. Small chronic cortical/subcortical left parietal infarct. Vascular: Major vessel flow voids at the skull base are preserved. Skull and upper cervical spine: Normal marrow signal is preserved. Sinuses/Orbits: Minor mucosal thickening.  Orbits are unremarkable. Other: Sella is unremarkable. Bilateral mastoid effusions. Prominence of the adenoids. IMPRESSION: Punctate acute or subacute left parietal cortical infarct. No hemorrhage or mass. Small chronic left parietal cortical/subcortical infarct. Bilateral mastoid effusions.  Prominent adenoids. Electronically Signed   By: Guadlupe Spanish M.D.   On: 02/08/2022 11:30   MR Cervical Spine Wo Contrast  Result Date: 02/08/2022 CLINICAL DATA:  Provided history: Cervical radiculopathy, no red flags. Additional history provided by scanning technologist: Patient reports headache and chest pain since yesterday, headache for 3 days. EXAM: MRI CERVICAL SPINE WITHOUT CONTRAST TECHNIQUE: Multiplanar, multisequence MR imaging of the cervical spine was performed. No intravenous contrast was administered. COMPARISON:  Concurrently performed brain MRI 02/08/2022. FINDINGS: Mild intermittent motion degradation. Alignment: Straightening of the expected cervical lordosis. No significant spondylolisthesis. Vertebrae: Vertebral body height is maintained. No significant marrow edema or focal suspicious osseous lesion. Ventral osteophytes at C4-C5 and C5-C6. Cord: No signal  abnormality identified within the cervical spinal cord. Posterior Fossa, vertebral arteries, paraspinal tissues: Posterior fossa better assessed on same-day brain MRI. Nonspecific prominence of the nasopharyngeal/adenoid soft tissues. Flow voids preserved within the imaged cervical vertebral arteries. Paraspinal soft tissues unremarkable. Disc levels: No more than mild disc degeneration within the cervical spine. C2-C3: No significant disc herniation or stenosis. C3-C4: No significant disc herniation or spinal canal stenosis. Left-sided uncovertebral hypertrophy results in mild to moderate left neural foraminal narrowing. C4-C5: No significant disc herniation or spinal canal stenosis. Left-sided uncovertebral hypertrophy results in mild left neural foraminal narrowing. C5-C6: No significant disc herniation or stenosis. C6-C7: No significant disc herniation or stenosis. C7-T1: No significant disc herniation or stenosis. IMPRESSION: Mildly motion degraded exam. Cervical spondylosis, as outlined. No significant spinal canal stenosis. Uncovertebral hypertrophy results in mild-to-moderate left neural foraminal narrowing at C3-C4, and mild left neural foraminal narrowing at C4-C5. No more than mild disc degeneration. Nonspecific straightening of the expected cervical lordosis. Electronically Signed   By: Jackey Loge D.O.   On: 02/08/2022 11:29       Assessment: 46 year old male presenting with new onset of right sided weakness and numbness. LKN was 10 PM yesterday. The patient woke up with the symptoms at 4 AM today. No prior history of stroke. Also with intermittent CP with radiation to RUE, as well as a 10/10 headache.  1. Exam reveals subtle right sided motor deficits as well as decreased right sided sensation.  2. MRI brain: Punctate acute or subacute left parietal cortical infarct. No hemorrhage or mass. Small chronic left parietal cortical/subcortical infarct. 3. MRA head: No intracranial large vessel  occlusion or proximal high-grade arterial stenosis. Both posterior cerebral arteries demonstrate apparent distal branch atherosclerotic irregularity. 4. MRA neck: No large vessel occlusion or hemodynamically significant stenosis. 5. MRI C-spine: No significant spinal canal stenosis. Uncovertebral hypertrophy results in mild-to-moderate left neural foraminal narrowing at C3-C4, and mild left neural foraminal narrowing at C4-C5. 6. Stroke risk factors: DM, HTN and tobacco abuse  Recommendations: 1. HgbA1c, fasting lipid panel 2. TTE 3. PT consult, OT consult, Speech consult 4. Start atorvastatin 40 mg po qd 5. Start ASA  81 mg po qd 6. Risk factor modification 7. Telemetry monitoring 8. Frequent neuro checks 9. NPO until passes stroke swallow screen 10. Permissive HTN    Electronically signed: Dr. Caryl Pina 02/08/2022, 11:48 AM

## 2022-02-08 NOTE — ED Notes (Signed)
EDP at bedside  

## 2022-02-09 ENCOUNTER — Encounter: Payer: Self-pay | Admitting: *Deleted

## 2022-02-09 ENCOUNTER — Other Ambulatory Visit: Payer: Self-pay | Admitting: Cardiology

## 2022-02-09 DIAGNOSIS — I639 Cerebral infarction, unspecified: Secondary | ICD-10-CM

## 2022-02-09 DIAGNOSIS — E119 Type 2 diabetes mellitus without complications: Secondary | ICD-10-CM

## 2022-02-09 DIAGNOSIS — E1165 Type 2 diabetes mellitus with hyperglycemia: Secondary | ICD-10-CM

## 2022-02-09 DIAGNOSIS — I63312 Cerebral infarction due to thrombosis of left middle cerebral artery: Secondary | ICD-10-CM | POA: Diagnosis not present

## 2022-02-09 DIAGNOSIS — I5042 Chronic combined systolic (congestive) and diastolic (congestive) heart failure: Secondary | ICD-10-CM | POA: Diagnosis not present

## 2022-02-09 DIAGNOSIS — I10 Essential (primary) hypertension: Secondary | ICD-10-CM

## 2022-02-09 DIAGNOSIS — I63033 Cerebral infarction due to thrombosis of bilateral carotid arteries: Secondary | ICD-10-CM | POA: Diagnosis not present

## 2022-02-09 LAB — GLUCOSE, CAPILLARY
Glucose-Capillary: 125 mg/dL — ABNORMAL HIGH (ref 70–99)
Glucose-Capillary: 70 mg/dL (ref 70–99)
Glucose-Capillary: 96 mg/dL (ref 70–99)

## 2022-02-09 LAB — LIPID PANEL
Cholesterol: 169 mg/dL (ref 0–200)
HDL: 32 mg/dL — ABNORMAL LOW (ref >40)
LDL Cholesterol: 110 mg/dL — ABNORMAL HIGH (ref 0–99)
Total CHOL/HDL Ratio: 5.3 RATIO
Triglycerides: 135 mg/dL (ref <150)
VLDL: 27 mg/dL (ref 0–40)

## 2022-02-09 LAB — RAPID URINE DRUG SCREEN, HOSP PERFORMED
Amphetamines: NOT DETECTED
Barbiturates: NOT DETECTED
Benzodiazepines: NOT DETECTED
Cocaine: NOT DETECTED
Opiates: NOT DETECTED
Tetrahydrocannabinol: POSITIVE — AB

## 2022-02-09 LAB — ANTITHROMBIN III: AntiThromb III Func: 103 % (ref 75–120)

## 2022-02-09 MED ORDER — CARVEDILOL 3.125 MG PO TABS: 3.1250 mg | ORAL_TABLET | Freq: Two times a day (BID) | ORAL | 11 refills | Status: DC

## 2022-02-09 MED ORDER — TRAMADOL HCL 50 MG PO TABS
50.0000 mg | ORAL_TABLET | Freq: Four times a day (QID) | ORAL | Status: DC | PRN
  Administered 2022-02-09: 50 mg via ORAL
  Filled 2022-02-09: qty 1

## 2022-02-09 MED ORDER — NICOTINE 21 MG/24HR TD PT24: 21.0000 mg | MEDICATED_PATCH | Freq: Every day | TRANSDERMAL | 0 refills | Status: AC

## 2022-02-09 MED ORDER — ATORVASTATIN CALCIUM 40 MG PO TABS
40.0000 mg | ORAL_TABLET | Freq: Every day | ORAL | Status: DC
  Administered 2022-02-09: 40 mg via ORAL
  Filled 2022-02-09: qty 1

## 2022-02-09 MED ORDER — CLOPIDOGREL BISULFATE 75 MG PO TABS: 75.0000 mg | ORAL_TABLET | Freq: Every day | ORAL | 0 refills | Status: DC

## 2022-02-09 MED ORDER — ASPIRIN EC 81 MG PO TBEC
81.0000 mg | DELAYED_RELEASE_TABLET | Freq: Every day | ORAL | Status: DC
  Administered 2022-02-09: 81 mg via ORAL
  Filled 2022-02-09: qty 1

## 2022-02-09 MED ORDER — CLOPIDOGREL BISULFATE 75 MG PO TABS
75.0000 mg | ORAL_TABLET | Freq: Every day | ORAL | Status: DC
  Administered 2022-02-09: 75 mg via ORAL
  Filled 2022-02-09: qty 1

## 2022-02-09 MED ORDER — LISINOPRIL 5 MG PO TABS: 5.0000 mg | ORAL_TABLET | Freq: Every day | ORAL | 11 refills | Status: DC

## 2022-02-09 MED ORDER — FAMOTIDINE 10 MG PO TABS
10.0000 mg | ORAL_TABLET | ORAL | 1 refills | Status: DC | PRN
Start: 2022-02-09 — End: 2024-11-04

## 2022-02-09 MED ORDER — ATORVASTATIN CALCIUM 80 MG PO TABS: 80.0000 mg | ORAL_TABLET | Freq: Every day | ORAL | Status: DC

## 2022-02-09 MED ORDER — OXYCODONE-ACETAMINOPHEN 5-325 MG PO TABS
1.0000 | ORAL_TABLET | ORAL | Status: AC
  Administered 2022-02-09: 1 via ORAL
  Filled 2022-02-09: qty 1

## 2022-02-09 MED ORDER — FUROSEMIDE 10 MG/ML IJ SOLN
10.0000 mg | Freq: Once | INTRAMUSCULAR | Status: AC
  Administered 2022-02-09: 10 mg via INTRAVENOUS
  Filled 2022-02-09: qty 4

## 2022-02-09 MED ORDER — ATORVASTATIN CALCIUM 80 MG PO TABS: 80.0000 mg | ORAL_TABLET | Freq: Every day | ORAL | 1 refills | Status: DC

## 2022-02-09 MED ORDER — ASPIRIN 81 MG PO TBEC: 81.0000 mg | DELAYED_RELEASE_TABLET | Freq: Every day | ORAL | 11 refills | Status: DC

## 2022-02-09 NOTE — TOC Initial Note (Signed)
Transition of Care [facility]) - Initial/Assessment Note    Patient Details  Name: Roger Pennington MRN: [id number] Date of Birth: 1976/06/26  [facility]) CM/SW Contact:    Kermit Balo, RN Phone Number: 02/09/2022, 3:33 PM  Clinical Narrative:                 Patient is from home with his spouse. He denies any issues with transportation or home medications.  Pt with recommendations for outpatient therapy. Pt prefers to attend at [facility]. Orders in epic and information is on the AVS.  TOC following for further d/c needs.   Expected Discharge Plan: OP Rehab Barriers to Discharge: Continued Medical Work up   Patient Goals and CMS Choice     Choice offered to / list presented to : Patient  Expected Discharge Plan and Services Expected Discharge Plan: OP Rehab   Discharge Planning Services: CM Consult   Living arrangements for the past 2 months: Single Family Home                                      Prior Living Arrangements/Services Living arrangements for the past 2 months: Single Family Home Lives with:: Spouse Patient language and need for interpreter reviewed:: Yes Do you feel safe going back to the place where you live?: Yes        Care giver support system in place?: Yes (comment)   Criminal Activity/Legal Involvement Pertinent to Current Situation/Hospitalization: No - Comment as needed  Activities of Daily Living Home Assistive Devices/Equipment: None ADL Screening (condition at time of admission) Patient's cognitive ability adequate to safely complete daily activities?: Yes Is the patient deaf or have difficulty hearing?: No Does the patient have difficulty seeing, even when wearing glasses/contacts?: No Does the patient have difficulty concentrating, remembering, or making decisions?: No Patient able to express need for assistance with ADLs?: Yes Does the patient have difficulty dressing or bathing?: No Independently  performs ADLs?: Yes (appropriate for developmental age) Does the patient have difficulty walking or climbing stairs?: No Weakness of Legs: None Weakness of Arms/Hands: None  Permission Sought/Granted                  Emotional Assessment Appearance:: Appears stated age Attitude/Demeanor/Rapport: Engaged Affect (typically observed): Accepting Orientation: : Oriented to Self, Oriented to Place, Oriented to  Time, Oriented to Situation   Psych Involvement: No (comment)  Admission diagnosis:  Tobacco abuse [Z72.0] Atypical chest pain [R07.89] Stroke (cerebrum) (HCC) [I63.9] Cerebrovascular accident (CVA), unspecified mechanism (HCC) [I63.9] Hyperglycemia due to diabetes mellitus (HCC) [E11.65] Patient Active Problem List   Diagnosis Date Noted   Morbid obesity (HCC) 02/09/2022   Chronic combined systolic (congestive) and diastolic (congestive) heart failure (HCC) 02/09/2022   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   Abscess    Perineal abscess and cellulitis 05/24/2020   Sepsis (HCC) 05/24/2020   Diabetes mellitus without complication (HCC) 05/24/2020   Atypical chest pain 05/24/2020   Lower abdominal pain 05/24/2020   Severe recurrent major depression without psychotic features (HCC) 06/05/2019   Suicide attempt (HCC) 06/05/2019   Essential hypertension 06/05/2019   Social anxiety disorder 06/05/2019   Intentional acetaminophen overdose (HCC) 06/04/2019   Depression with suicidal ideation 06/04/2019   Severe recurrent major depression (HCC) 07/02/2017   Tobacco abuse 07/02/2017   Cannabis use disorder, severe, dependence (HCC) 07/02/2017   Upper GI bleed 02/08/2016  PCP:  [facility]:   [facility] [address]), [place] - [address] [place]) [place] [address] Phone: [phone] Fax: [phone]  [facility] [address] [place]  [address] Phone: [phone] Fax: [phone]  [facility] [address] [place] - [address] [place] [address] Phone: [phone] Fax: [phone]  [facility] [address] ([place]), [place] - [address] [place] ([place]) [place] [address] Phone: [phone] Fax: [phone]  [facility] [place], [place] - [address] [place] [address] Phone: [phone] Fax: [phone]     Social Determinants of Health (SDOH) Interventions    Readmission Risk Interventions No flowsheet data found.

## 2022-02-09 NOTE — Evaluation (Signed)
Physical Therapy Evaluation Patient Details Name: Roger Pennington MRN: [id number] DOB: March 10, 1976 Today's Date: 02/09/2022  History of Present Illness  Pt is a 46 y.o. male who presented to the ED 2/22 with c/o persistent headache, new onset of chest pains, and right arm numbness and weakness. MRI revealed punctate acute or subacute left parietal cortical infarct, small chronic left parietal cortical/subcortical infarct.  PMH:  HTN, IDDM, anxiety/depression, cigarette smoker   Clinical Impression  Pt admitted with above diagnosis. PTA pt lived at home with his wife in one-level house with ramp to enter, independent mobility/ADLs. Pt currently with functional limitations due to the deficits listed below (see PT Problem List). On eval, pt required supervision transfers, supervision ambulation 350' without AD, and min guard assist ascend/descend 5 steps with bilat rails. He scored 21/24 on DGI showing mild balance impairment. Primary c/o is blurry vision R eye and numbness/weakness RUE. Pt will benefit from skilled PT to increase their independence and safety with mobility to allow discharge to the venue listed below.          Recommendations for follow up therapy are one component of a multi-disciplinary discharge planning process, led by the attending physician.  Recommendations may be updated based on patient status, additional functional criteria and insurance authorization.  Follow Up Recommendations Outpatient PT    Assistance Recommended at Discharge PRN  Patient can return home with the following       Equipment Recommendations None recommended by PT  Recommendations for Other Services       Functional Status Assessment Patient has had a recent decline in their functional status and demonstrates the ability to make significant improvements in function in a reasonable and predictable amount of time.     Precautions / Restrictions Precautions Precautions: None      Mobility   Bed Mobility Overal bed mobility: Modified Independent                  Transfers Overall transfer level: Needs assistance Equipment used: None Transfers: Sit to/from Stand Sit to Stand: Supervision           General transfer comment: supervision for safety    Ambulation/Gait Ambulation/Gait assistance: Supervision Gait Distance (Feet): 350 Feet Assistive device: None Gait Pattern/deviations: Wide base of support, Step-through pattern, Decreased stride length Gait velocity: decreased Gait velocity interpretation: >2.62 ft/sec, indicative of community ambulatory   General Gait Details: slow, guarded gait. Pt reports blurry vision R eye affecting his balance. No LOB noted.  Stairs            Wheelchair Mobility    Modified Rankin (Stroke Patients Only) Modified Rankin (Stroke Patients Only) Pre-Morbid Rankin Score: No symptoms Modified Rankin: No significant disability     Balance                                 Standardized Balance Assessment Standardized Balance Assessment : Dynamic Gait Index   Dynamic Gait Index Level Surface: Normal Change in Gait Speed: Normal Gait with Horizontal Head Turns: Mild Impairment Gait with Vertical Head Turns: Normal Gait and Pivot Turn: Normal Step Over Obstacle: Normal Step Around Obstacles: Mild Impairment Steps: Mild Impairment Total Score: 21       Pertinent Vitals/Pain Pain Assessment Pain Assessment: Faces Faces Pain Scale: Hurts a little bit Pain Location: headache/eyes Pain Descriptors / Indicators: Headache Pain Intervention(s): Monitored during session    Home Living Family/patient expects  to be discharged to:: Private residence Living Arrangements: Spouse/significant other Available Help at Discharge: Family;Available 24 hours/day Type of Home: House Home Access: Ramped entrance       Home Layout: One level Home Equipment: Shower seat      Prior Function Prior Level of  Function : Independent/Modified Independent;Driving                     Hand Dominance   Dominant Hand: Right    Extremity/Trunk Assessment   Upper Extremity Assessment Upper Extremity Assessment: RUE deficits/detail RUE Deficits / Details: numbness/tingling in fingers, weakness    Lower Extremity Assessment Lower Extremity Assessment: Overall WFL for tasks assessed    Cervical / Trunk Assessment Cervical / Trunk Assessment: Normal  Communication   Communication: No difficulties  Cognition Arousal/Alertness: Awake/alert Behavior During Therapy: WFL for tasks assessed/performed Overall Cognitive Status: Within Functional Limits for tasks assessed                                          General Comments General comments (skin integrity, edema, etc.): Pt with reports of blurry vision R eye.    Exercises     Assessment/Plan    PT Assessment Patient needs continued PT services  PT Problem List Decreased mobility;Decreased balance;Impaired sensation;Decreased strength       PT Treatment Interventions Therapeutic activities;Gait training;Therapeutic exercise;Patient/family education;Stair training;Balance training;Functional mobility training    PT Goals (Current goals can be found in the Care Plan section)  Acute Rehab PT Goals Patient Stated Goal: home PT Goal Formulation: With patient Time For Goal Achievement: 02/23/22 Potential to Achieve Goals: Good    Frequency Min 4X/week     Co-evaluation               AM-PAC PT "6 Clicks" Mobility  Outcome Measure Help needed turning from your back to your side while in a flat bed without using bedrails?: None Help needed moving from lying on your back to sitting on the side of a flat bed without using bedrails?: None Help needed moving to and from a bed to a chair (including a wheelchair)?: A Little Help needed standing up from a chair using your arms (e.g., wheelchair or bedside  chair)?: A Little Help needed to walk in hospital room?: A Little Help needed climbing 3-5 steps with a railing? : A Little 6 Click Score: 20    End of Session Equipment Utilized During Treatment: Gait belt Activity Tolerance: Patient tolerated treatment well Patient left: in bed;with call bell/phone within reach;with family/visitor present Nurse Communication: Mobility status PT Visit Diagnosis: Unsteadiness on feet (R26.81)    Time: 9518-8416 PT Time Calculation (min) (ACUTE ONLY): 19 min   Charges:   PT Evaluation $PT Eval Moderate Complexity: 1 Mod          Aida Raider, PT  Office # [phone] Pager [phone]   Ilda Foil 02/09/2022, 8:15 AM

## 2022-02-09 NOTE — Evaluation (Signed)
Speech Language Pathology Evaluation Patient Details Name: Roger Pennington MRN: [id number] DOB: 12-10-1976 Today's Date: 02/09/2022 Time: 7824-2353 SLP Time Calculation (min) (ACUTE ONLY): 43 min  Problem List:  Patient Active Problem List   Diagnosis Date Noted   Morbid obesity (HCC) 02/09/2022   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   Abscess    Perineal abscess and cellulitis 05/24/2020   Sepsis (HCC) 05/24/2020   Diabetes mellitus without complication (HCC) 05/24/2020   Atypical chest pain 05/24/2020   Lower abdominal pain 05/24/2020   Severe recurrent major depression without psychotic features (HCC) 06/05/2019   Suicide attempt (HCC) 06/05/2019   Essential hypertension 06/05/2019   Social anxiety disorder 06/05/2019   Intentional acetaminophen overdose (HCC) 06/04/2019   Depression with suicidal ideation 06/04/2019   Severe recurrent major depression (HCC) 07/02/2017   Tobacco abuse 07/02/2017   Cannabis use disorder, severe, dependence (HCC) 07/02/2017   Upper GI bleed 02/08/2016   Past Medical History:  Past Medical History:  Diagnosis Date   Diabetes mellitus without complication (HCC)    Hypertension    Tobacco abuse    Past Surgical History:  Past Surgical History:  Procedure Laterality Date   ESOPHAGOGASTRODUODENOSCOPY (EGD) WITH PROPOFOL N/A 02/09/2016   Procedure: ESOPHAGOGASTRODUODENOSCOPY (EGD) WITH PROPOFOL;  Surgeon: Christena Deem, MD;  Location: [facility] ENDOSCOPY;  Service: Endoscopy;  Laterality: N/A;   INCISION AND DRAINAGE ABSCESS Right 05/25/2020   Procedure: INCISION AND DRAINAGE ABSCESS;  Surgeon: Leafy Ro, MD;  Location: [facility] ORS;  Service: General;  Laterality: Right;   HPI:  Pt is a 46 y.o. male who presented to the ED 2/22 with c/o persistent headache, new onset of chest pains, and right arm numbness and weakness. MRI revealed punctate acute or subacute left parietal cortical infarct, small chronic left parietal cortical/subcortical infarct.   PMH:  HTN, IDDM, anxiety/depression, cigarette smoker   Assessment / Plan / Recommendation Clinical Impression  Pt presents wtih cognitive-linguistic impairments that are considered to be mild per results of SLUMS (22/30). 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 He has difficulty througout testing with retrieval, working memory, and selective attention, which also impacts his performance with more complex problem solving. Pt acknowledges that he does have trouble with his attention at baseline, but that he uses strategies to accommodate it (such as a cubicle at work that eliminates visual distractions). He believes that these difficulties have all been acutely exacerbated by his stroke. Pt also voiced concern about remembering instructions for his new medications, as he sometimes also had trouble remembering how/when to take his medications PTA. SLP intervention included cues for problem solving and anticipatory awareness and plan to implement a pill box upon return home. Pt will benefit from SLP f/u acutely and at OP level upon discharge to maximize cognitive-linguistic skills and safety.    SLP Assessment  SLP Recommendation/Assessment: Patient needs continued Speech Lanaguage Pathology Services SLP Visit Diagnosis: Cognitive communication deficit (R41.841)    Recommendations for follow up therapy are one component of a multi-disciplinary discharge planning process, led by the attending physician.  Recommendations may be updated based on patient status, additional functional criteria and insurance authorization.    Follow Up Recommendations  Outpatient SLP    Assistance Recommended at Discharge  Intermittent Supervision/Assistance  Functional Status Assessment Patient has had a recent decline in their functional status and demonstrates the ability to make significant improvements in function in a  reasonable  and predictable amount of time.  Frequency and Duration min 2x/week         SLP Evaluation Cognition  Overall Cognitive Status: Impaired/Different from baseline Arousal/Alertness: Awake/alert Orientation Level: Oriented X4 Attention: Selective Selective Attention: Impaired Selective Attention Impairment: Verbal complex Memory: Impaired Memory Impairment: Retrieval deficit;Decreased recall of new information;Other (comment) (working memory) Awareness: Appears intact Problem Solving: Impaired Problem Solving Impairment: Verbal complex Safety/Judgment: Appears intact       Comprehension  Auditory Comprehension Overall Auditory Comprehension: Appears within functional limits for tasks assessed    Expression Expression Primary Mode of Expression: Verbal Verbal Expression Overall Verbal Expression: Impaired Initiation: No impairment Level of Generative/Spontaneous Verbalization: Conversation Naming: Impairment Divergent: Other (comment) (produced 10 words in 1 minute) Verbal Errors:  (word finding errors in conversation) Pragmatics: No impairment Non-Verbal Means of Communication: Not applicable Written Expression Dominant Hand: Right   Oral / Motor  Motor Speech Overall Motor Speech: Appears within functional limits for tasks assessed             Mahala Menghini., M.A. CCC-SLP Acute Rehabilitation Services Pager [phone] Office [phone]  02/09/2022, 2:22 PM

## 2022-02-09 NOTE — Evaluation (Signed)
Occupational Therapy Evaluation Patient Details Name: Roger Pennington MRN: [id number] DOB: 1976/01/26 Today's Date: 02/09/2022   History of Present Illness Pt is a 46 y.o. male who presented to the ED 2/22 with c/o persistent headache, new onset of chest pains, and right arm numbness and weakness. MRI revealed punctate acute or subacute left parietal cortical infarct, small chronic left parietal cortical/subcortical infarct.  PMH:  HTN, IDDM, anxiety/depression, cigarette smoker   Clinical Impression   PTA pt lives independently with wife and works at a Programme researcher, broadcasting/film/video. Pt with decreased sensation R hand, making fine motor/coordination and in-hand manipulation skills difficult. Also demonstrated difficulty with calculations/serial subtraction - discussed recommendation for further cognitive assessment by ST. Recommend follow up with OT a the neuro outpt clinic to maximize functional level of independence and facilitate safe return to work. Acute OT to follow.      Recommendations for follow up therapy are one component of a multi-disciplinary discharge planning process, led by the attending physician.  Recommendations may be updated based on patient status, additional functional criteria and insurance authorization.   Follow Up Recommendations  Outpatient OT (neuro outpt)    Assistance Recommended at Discharge Set up Supervision/Assistance  Patient can return home with the following Assist for transportation;Assistance with cooking/housework    Functional Status Assessment  Patient has had a recent decline in their functional status and demonstrates the ability to make significant improvements in function in a reasonable and predictable amount of time.  Equipment Recommendations       Recommendations for Other Services       Precautions / Restrictions Precautions Precautions: None      Mobility Bed Mobility Overal bed mobility: Modified Independent              General bed mobility comments: recommend direct S with medicaiton management; wife does finances    Transfers Overall transfer level: Modified independent                        Balance                                           ADL either performed or assessed with clinical judgement   ADL Overall ADL's : At baseline                                             Vision Baseline Vision/History: 1 Wears glasses (reading) Additional Comments: pt states his R eye is a little "blurry" however t does nto have his glasses; encouraged pt to follow up with his eye doctor if vision does not return to his baseline     Perception Perception Comments: WFL   Praxis Praxis Praxis-Other Comments: WFL    Pertinent Vitals/Pain Pain Assessment Pain Assessment: No/denies pain     Hand Dominance Right   Extremity/Trunk Assessment Upper Extremity Assessment Upper Extremity Assessment: RUE deficits/detail RUE Deficits / Details: numbness/tingling in fingers, weakness RUE Sensation: decreased light touch;decreased proprioception RUE Coordination: decreased fine motor (decreased stereognosis; feels "clumsy")       Cervical / Trunk Assessment Cervical / Trunk Assessment: Normal   Communication Communication Communication: No difficulties   Cognition Arousal/Alertness: Awake/alert Behavior During Therapy: WFL for tasks assessed/performed Overall Cognitive Status:  Impaired/Different from baseline Area of Impairment: Attention, Memory                               General Comments: will further assess; during serial substraciton, made errors on 3/6 trials; Unable to subtract $3.25 from $5. pt/wife state this would normally not be difficult (will further asess)     General Comments       Exercises Exercises: Other exercises Other Exercises Other Exercises: theraputty/coins; in-hand manipulation skills   Shoulder  Instructions      Home Living Family/patient expects to be discharged to:: Private residence Living Arrangements: Spouse/significant other Available Help at Discharge: Family;Available 24 hours/day Type of Home: House Home Access: Ramped entrance     Home Layout: One level     Bathroom Shower/Tub: Chief Strategy Officer: Standard Bathroom Accessibility: Yes   Home Equipment: Shower seat          Prior Functioning/Environment Prior Level of Function : Independent/Modified Independent;Driving;Working/employed (works at a Programme researcher, broadcasting/film/video and helps load wood, works around Scientist, forensic information into a Curator)                        OT Problem List: Decreased cognition;Impaired sensation;Impaired UE functional use      OT Treatment/Interventions: Self-care/ADL training;Therapeutic exercise;Neuromuscular education;Therapeutic activities;Patient/family education;Cognitive remediation/compensation    OT Goals(Current goals can be found in the care plan section) Acute Rehab OT Goals Patient Stated Goal: to get the use of his hand back OT Goal Formulation: With patient Time For Goal Achievement: 02/23/22 Potential to Achieve Goals: Good  OT Frequency: Min 2X/week    Co-evaluation              AM-PAC OT "6 Clicks" Daily Activity     Outcome Measure Help from another person eating meals?: None Help from another person taking care of personal grooming?: None Help from another person toileting, which includes using toliet, bedpan, or urinal?: None Help from another person bathing (including washing, rinsing, drying)?: None Help from another person to put on and taking off regular upper body clothing?: None Help from another person to put on and taking off regular lower body clothing?: None 6 Click Score: 24   End of Session Nurse Communication: Mobility status;Other (comment) (DC needs)  Activity Tolerance: Patient tolerated  treatment well Patient left: in bed;with call bell/phone within reach;with family/visitor present  OT Visit Diagnosis: Muscle weakness (generalized) (M62.81)                Time: 2993-7169 OT Time Calculation (min): 22 min Charges:  OT General Charges $OT Visit: 1 Visit OT Evaluation $OT Eval Low Complexity: 1 Low  Adrien Dietzman, OT/L   Acute OT Clinical Specialist Acute Rehabilitation Services Pager [phone] Office [phone]   [facility] 02/09/2022, 12:42 PM

## 2022-02-09 NOTE — Progress Notes (Signed)
Ordered 30 day event monitor following stroke. Will be read by Dr. Izora Ribas

## 2022-02-09 NOTE — Assessment & Plan Note (Addendum)
Work-up in progress.  Checking MRA, likely secondary to uncontrolled hypertension/small vessel disease.  LDL at 110, target below 70.  A1c at 7.0.  Started on statin.  Seen by PT and OT recommended outpatient PT.  Cleared by speech.  Counseled to quit smoking.  Recommend 30-day event monitor as outpatient to rule out atrial fibrillation, being set up with cardiology.  Patient will be discharged on aspirin 81 mg plus Plavix 75 mg x 3 weeks, then aspirin alone

## 2022-02-09 NOTE — Progress Notes (Addendum)
STROKE TEAM PROGRESS NOTE   SUBJECTIVE (INTERVAL HISTORY) His wife is at the bedside.  Overall his condition is stable. Patient reports he has had an intermittent headache x3 days, typically having these headaches once per month, lasting a few hours at a time.  He reports that he typically gets these headaches with increased blood pressure, sometimes reaching 236/200.  He also reports increased lethargy.  Of the incident prompting this admission, patient was at work when felt chest pain and was noted to have elevated BP and R sided weakness.   This morning he reports that his chest pain is still present, but less.  As well he endorses sharp pain behind his right eye and continued numbness of his right fingertips.  CTh with no acute abnormalities; MRI with punctate acute/subacute left parietal cortical infarct.  OBJECTIVE CBC:  Recent Labs  Lab 02/08/22 0810  WBC 5.3  HGB 12.9*  HCT 41.5  MCV 80.9  PLT 212   Basic Metabolic Panel:  Recent Labs  Lab 02/08/22 0810  NA 135  K 4.0  CL 103  CO2 23  GLUCOSE 236*  BUN 8  CREATININE 1.04  CALCIUM 8.9   Lipid Panel:  Recent Labs  Lab 02/09/22 0554  CHOL 169  TRIG 135  HDL 32*  CHOLHDL 5.3  VLDL 27  LDLCALC 409*   HgbA1c:  Recent Labs  Lab 02/08/22 1257  HGBA1C 7.0*   Urine Drug Screen: No results for input(s): LABOPIA, COCAINSCRNUR, LABBENZ, AMPHETMU, THCU, LABBARB in the last 168 hours.  Alcohol Level No results for input(s): ETH in the last 168 hours.  IMAGING past 24 hours CT Head Wo Contrast  Result Date: 02/08/2022 CLINICAL DATA:  Severe headache EXAM: CT HEAD WITHOUT CONTRAST TECHNIQUE: Contiguous axial images were obtained from the base of the skull through the vertex without intravenous contrast. RADIATION DOSE REDUCTION: This exam was performed according to the departmental dose-optimization program which includes automated exposure control, adjustment of the mA and/or kV according to patient size and/or use of  iterative reconstruction technique. COMPARISON:  Brain MRI 06/01/2021, CT head 06/01/2021 FINDINGS: Brain: There is no evidence of acute intracranial hemorrhage, extra-axial fluid collection, or acute infarct. Parenchymal volume is normal. The ventricles are normal in size. Gray-white differentiation is preserved. There is no mass lesion. There is no midline shift. Vascular: No hyperdense vessel or unexpected calcification. Skull: Normal. Negative for fracture or focal lesion. Sinuses/Orbits: There is mild mucosal thickening in the left sphenoid sinus. The globes and orbits are unremarkable. Other: There are bilateral mastoid effusions and a left middle ear effusion, unchanged since 2022. IMPRESSION: 1. No acute intracranial pathology. Normal noncontrast appearance of the brain. 2. Unchanged bilateral mastoid and left middle ear effusions. Electronically Signed   By: Lesia Hausen M.D.   On: 02/08/2022 09:24   MR ANGIO HEAD WO CONTRAST  Result Date: 02/08/2022 CLINICAL DATA:  Provided history: Stroke, follow-up. EXAM: MRA HEAD WITHOUT CONTRAST TECHNIQUE: Angiographic images of the Circle of Willis were acquired using MRA technique without intravenous contrast. COMPARISON:  Brain MRI 02/08/2022. FINDINGS: Anterior circulation: The intracranial internal carotid arteries are patent. The M1 middle cerebral arteries are patent. No M2 proximal branch occlusion or high-grade proximal stenosis is identified. The anterior cerebral arteries are patent. No intracranial aneurysm is identified. Posterior circulation: The intracranial vertebral arteries are patent. The basilar artery is patent. The posterior cerebral arteries are patent. Both posterior cerebral arteries demonstrate apparent distal branch atherosclerotic irregularity. Posterior communicating arteries are present bilaterally.  Anatomic variants: None significant. IMPRESSION: No intracranial large vessel occlusion or proximal high-grade arterial stenosis. Both  posterior cerebral arteries demonstrate apparent distal branch atherosclerotic irregularity. Electronically Signed   By: Jackey Loge D.O.   On: 02/08/2022 15:13   MR ANGIO NECK WO CONTRAST  Result Date: 02/08/2022 CLINICAL DATA:  Stroke, follow-up EXAM: MRA NECK WITHOUT CONTRAST TECHNIQUE: Angiographic images of the neck were acquired using MRA technique without intravenous contrast. Carotid stenosis measurements (when applicable) are obtained utilizing NASCET criteria, using the distal internal carotid diameter as the denominator. COMPARISON:  No pertinent prior exam. FINDINGS: Aortic arch: Not included Right carotid system: Included portions of the common carotids are patent. Internal and external carotids are patent. No stenosis at the ICA origins. Left carotid system: Included portions of the common carotids are patent. Internal and external carotids are patent. No stenosis at the ICA origins. Vertebral arteries: Included portions are patent and codominant. No stenosis. Other: None. IMPRESSION: No large vessel occlusion or hemodynamically significant stenosis. Electronically Signed   By: Guadlupe Spanish M.D.   On: 02/08/2022 15:19   MR BRAIN WO CONTRAST  Result Date: 02/08/2022 CLINICAL DATA:  Neuro deficit, acute, stroke suspected EXAM: MRI HEAD WITHOUT CONTRAST TECHNIQUE: Multiplanar, multiecho pulse sequences of the brain and surrounding structures were obtained without intravenous contrast. COMPARISON:  06/01/2021 FINDINGS: Brain: Punctate focus of diffusion hyperintensity is present in the left parietal lobe likely involving the cortex (series 2, image 40). No evidence of intracranial hemorrhage. There is no intracranial mass, mass effect, or edema. There is no hydrocephalus or extra-axial fluid collection. Ventricles and sulci are normal in size and configuration. Small chronic cortical/subcortical left parietal infarct. Vascular: Major vessel flow voids at the skull base are preserved. Skull and  upper cervical spine: Normal marrow signal is preserved. Sinuses/Orbits: Minor mucosal thickening.  Orbits are unremarkable. Other: Sella is unremarkable. Bilateral mastoid effusions. Prominence of the adenoids. IMPRESSION: Punctate acute or subacute left parietal cortical infarct. No hemorrhage or mass. Small chronic left parietal cortical/subcortical infarct. Bilateral mastoid effusions.  Prominent adenoids. Electronically Signed   By: Guadlupe Spanish M.D.   On: 02/08/2022 11:30   MR Cervical Spine Wo Contrast  Result Date: 02/08/2022 CLINICAL DATA:  Provided history: Cervical radiculopathy, no red flags. Additional history provided by scanning technologist: Patient reports headache and chest pain since yesterday, headache for 3 days. EXAM: MRI CERVICAL SPINE WITHOUT CONTRAST TECHNIQUE: Multiplanar, multisequence MR imaging of the cervical spine was performed. No intravenous contrast was administered. COMPARISON:  Concurrently performed brain MRI 02/08/2022. FINDINGS: Mild intermittent motion degradation. Alignment: Straightening of the expected cervical lordosis. No significant spondylolisthesis. Vertebrae: Vertebral body height is maintained. No significant marrow edema or focal suspicious osseous lesion. Ventral osteophytes at C4-C5 and C5-C6. Cord: No signal abnormality identified within the cervical spinal cord. Posterior Fossa, vertebral arteries, paraspinal tissues: Posterior fossa better assessed on same-day brain MRI. Nonspecific prominence of the nasopharyngeal/adenoid soft tissues. Flow voids preserved within the imaged cervical vertebral arteries. Paraspinal soft tissues unremarkable. Disc levels: No more than mild disc degeneration within the cervical spine. C2-C3: No significant disc herniation or stenosis. C3-C4: No significant disc herniation or spinal canal stenosis. Left-sided uncovertebral hypertrophy results in mild to moderate left neural foraminal narrowing. C4-C5: No significant disc  herniation or spinal canal stenosis. Left-sided uncovertebral hypertrophy results in mild left neural foraminal narrowing. C5-C6: No significant disc herniation or stenosis. C6-C7: No significant disc herniation or stenosis. C7-T1: No significant disc herniation or stenosis. IMPRESSION: Mildly motion degraded  exam. Cervical spondylosis, as outlined. No significant spinal canal stenosis. Uncovertebral hypertrophy results in mild-to-moderate left neural foraminal narrowing at C3-C4, and mild left neural foraminal narrowing at C4-C5. No more than mild disc degeneration. Nonspecific straightening of the expected cervical lordosis. Electronically Signed   By: Jackey Loge D.O.   On: 02/08/2022 11:29   ECHOCARDIOGRAM COMPLETE  Result Date: 02/08/2022    ECHOCARDIOGRAM REPORT   Patient Name:   GRIGORIY TREICHLER Date of Exam: 02/08/2022 Medical Rec #:  [id number]          Height:       72.0 in Accession #:    [id number]         Weight:       265.0 lb Date of Birth:  July 05, 1976           BSA:          2.401 m Patient Age:    46 years           BP:           177/99 mmHg Patient Gender: M                  HR:           65 bpm. Exam Location:  Inpatient Procedure: 2D Echo Indications:    Stroke  History:        Patient has prior history of Echocardiogram examinations, most                 recent 05/25/2020. Risk Factors:Diabetes and Hypertension.  Sonographer:    Devonne Doughty Referring Phys: [id number] Emeline General IMPRESSIONS  1. Left ventricular ejection fraction, by estimation, is 45 to 50%. The left ventricle has mildly decreased function. The left ventricle demonstrates global hypokinesis. Left ventricular diastolic parameters are consistent with Grade I diastolic dysfunction (impaired relaxation).  2. Right ventricular systolic function is normal. The right ventricular size is mildly enlarged. There is normal pulmonary artery systolic pressure.  3. The mitral valve is normal in structure. Trivial mitral valve regurgitation.  No evidence of mitral stenosis.  4. The aortic valve is normal in structure. Aortic valve regurgitation is not visualized. No aortic stenosis is present.  5. The inferior vena cava is normal in size with greater than 50% respiratory variability, suggesting right atrial pressure of 3 mmHg. Comparison(s): No significant change from prior study. Prior images reviewed side by side. FINDINGS  Left Ventricle: Left ventricular ejection fraction, by estimation, is 45 to 50%. The left ventricle has mildly decreased function. The left ventricle demonstrates global hypokinesis. The left ventricular internal cavity size was normal in size. There is  no left ventricular hypertrophy. Left ventricular diastolic parameters are consistent with Grade I diastolic dysfunction (impaired relaxation). Right Ventricle: The right ventricular size is mildly enlarged. No increase in right ventricular wall thickness. Right ventricular systolic function is normal. There is normal pulmonary artery systolic pressure. The tricuspid regurgitant velocity is 2.11  m/s, and with an assumed right atrial pressure of 3 mmHg, the estimated right ventricular systolic pressure is 20.8 mmHg. Left Atrium: Left atrial size was normal in size. Right Atrium: Right atrial size was normal in size. Pericardium: There is no evidence of pericardial effusion. Mitral Valve: The mitral valve is normal in structure. Trivial mitral valve regurgitation. No evidence of mitral valve stenosis. Tricuspid Valve: The tricuspid valve is normal in structure. Tricuspid valve regurgitation is trivial. No evidence of tricuspid stenosis. Aortic Valve: The aortic valve  is normal in structure. Aortic valve regurgitation is not visualized. No aortic stenosis is present. Aortic valve mean gradient measures 2.0 mmHg. Aortic valve peak gradient measures 5.0 mmHg. Aortic valve area, by VTI measures 3.66 cm. Pulmonic Valve: The pulmonic valve was normal in structure. Pulmonic valve  regurgitation is not visualized. No evidence of pulmonic stenosis. Aorta: The aortic root is normal in size and structure. Venous: The inferior vena cava is normal in size with greater than 50% respiratory variability, suggesting right atrial pressure of 3 mmHg. IAS/Shunts: No atrial level shunt detected by color flow Doppler.  LEFT VENTRICLE PLAX 2D LVIDd:         4.96 cm   Diastology LVIDs:         3.70 cm   LV e' medial:    6.20 cm/s LV PW:         1.02 cm   LV E/e' medial:  7.2 LV IVS:        1.02 cm   LV e' lateral:   6.85 cm/s LVOT diam:     2.20 cm   LV E/e' lateral: 6.6 LV SV:         90 LV SV Index:   37 LVOT Area:     3.80 cm  RIGHT VENTRICLE RV Basal diam:  4.19 cm RV Mid diam:    3.60 cm RV S prime:     9.25 cm/s TAPSE (M-mode): 1.8 cm LEFT ATRIUM             Index        RIGHT ATRIUM           Index LA diam:        3.80 cm 1.58 cm/m   RA Area:     21.80 cm LA Vol (A2C):   52.5 ml 21.86 ml/m  RA Volume:   68.10 ml  28.36 ml/m LA Vol (A4C):   57.8 ml 24.07 ml/m LA Biplane Vol: 55.4 ml 23.07 ml/m  AORTIC VALVE AV Area (Vmax):    3.73 cm AV Area (Vmean):   3.57 cm AV Area (VTI):     3.66 cm AV Vmax:           112.00 cm/s AV Vmean:          73.600 cm/s AV VTI:            0.245 m AV Peak Grad:      5.0 mmHg AV Mean Grad:      2.0 mmHg LVOT Vmax:         110.00 cm/s LVOT Vmean:        69.200 cm/s LVOT VTI:          0.236 m LVOT/AV VTI ratio: 0.96  AORTA Ao Root diam: 3.20 cm Ao Asc diam:  3.50 cm MITRAL VALVE               TRICUSPID VALVE MV Area (PHT): 2.22 cm    TR Peak grad:   17.8 mmHg MV Decel Time: 342 msec    TR Vmax:        211.00 cm/s MV E velocity: 44.90 cm/s MV A velocity: 58.60 cm/s  SHUNTS MV E/A ratio:  0.77        Systemic VTI:  0.24 m                            Systemic Diam: 2.20 cm Roger Schultz MD Electronically signed  by Roger Schultz MD Signature Date/Time: 02/08/2022/2:06:43 PM    Final      PHYSICAL EXAM Temp:  [97.6 F (36.4 C)-98.3 F (36.8 C)] 97.6 F (36.4 C) (02/23  0728) Pulse Rate:  [58-70] 66 (02/23 0728) Resp:  [14-21] 18 (02/23 0728) BP: (144-177)/(91-104) 144/95 (02/23 0728) SpO2:  [97 %-100 %] 100 % (02/23 0728)  General - Well nourished, well developed, young African-American male in no apparent distress.  Cardiovascular - Regular rhythm and rate on telemetry.  Mental Status -  Level of arousal and orientation to time, place, and person were intact. Language including expression, naming, repetition, comprehension was assessed and found intact. Attention span and concentration were normal. Recent and remote memory were intact. Fund of Knowledge was assessed and was intact.  Cranial Nerves II - XII - II - Visual field intact OU. III, IV, VI - Extraocular movements intact. V - Facial sensation intact bilaterally. VII - Facial movement intact bilaterally. VIII - Hearing & vestibular intact bilaterally. X - Palate elevates symmetrically. XI - Shoulder shrug intact bilaterally. XII - Tongue protrusion intact.  Motor Strength - The patients strength was normal in all extremities and pronator drift was absent.  Bulk was normal and fasciculations were absent.   Motor Tone - Muscle tone was assessed at the neck and appendages and was normal.  Sensory - Light touch was assessed and  symmetrical in BUE and BLE.  The only area of asymmetry was in his fingertips, left greater than right.  Coordination - The patient had normal movements in the hands and feet with no ataxia or dysmetria.  Tremor was absent, but patient with finger tapping motion of the right hand (reported he was unsure why he continued to do such a motion).  Gait and Station - deferred.    ASSESSMENT/PLAN Roger Pennington is a 46 y.o. male with history of DM, HTN, and alcohol and tobacco use disorders presenting with new onset R sided weakness and numbness, intermittent chest pain with radiation to RUE, and severe HA.   Stroke: Acute/Subacute Punctate left parietal  cortical infarct likely secondary to small vessel disease given risk factors Code Stroke CT head No acute abnormality.  MRI  Punctate acute or subacute left parietal cortical infarct. No hemorrhage or mass. Small chronic left parietal cortical/subcortical infarct. MRA unremarkable 2D Echo LVEF 45-50%. Recommend 30-day CardioNet monitoring as outpatient to rule out A-fib LDL 110 HgbA1c 7.0 Hypercoagulable work-up pending UDS positive for THC VTE prophylaxis - SQ Lovenox No antithrombotic prior to admission, now on aspirin 81 mg daily and clopidogrel 75 mg daily x 3 weeks, then ASA alone Therapy recommendations:  Outpatient PT; pending OT/SLP evaluation Disposition:  Pending  Hypertension Home meds:  Norvasc 5 mg stable gradually normalize in 5-7 days Long-term BP goal normotensive  Hyperlipidemia Home meds:  N/A LDL 110, goal < 70 Atorvastatin 40 mg  Continue statin at discharge  Diabetes type II Controlled Home meds:  Lantus 25u BID, Basaglar 25u daily, Metformin 1000 mg BID HgbA1c 7.0, goal < 7.0 CBGs SSI and bedtime correction Semglee 25u daily, Tradjenta 5 mg  Tobacco abuse Current smoker, 7 to 9 cigarettes/day  Smoking cessation counseling provided Nicotine patch provided Pt is willing to quit  Other Stroke Risk Factors ETOH use, alcohol level 144, advised to drink no more than 1-2 drink(s) a day Substance abuse - UDS:  THC POSITIVE, Patient advised to stop using due to stroke risk. Obesity, Body mass index is 35.94 kg/m., BMI >/=  30 associated with increased stroke risk, recommend weight loss, diet and exercise as appropriate   Other Active Problems   Hospital day # 0  Lamar Sprinkles, MD Stroke Neurology- Neuro Psych Resident 02/09/2022 8:55 AM  ATTENDING NOTE: I reviewed above note and agree with the assessment and plan. Pt was seen and examined.   46 year old male with history of diabetes, hypertension, smoker presented for right-sided weakness  numbness, chest pain and headache.  CT no acute abnormality.  MRI showed left MCA/ACA punctate infarcts.  MRA head and neck unremarkable.  EF 45 to 50%.  LDL 110, A1c 7.0, UDS positive for THC.  Creatinine 1.04.  Currently, patient still has right fingertip numbness and mild headache at back of right eye.  Patient denies history of migraine.  Although cutting down cigarette smoking, still smoking about 8 cigarettes/day.  On exam, patient neurologically intact except right hand/fingertip numbness.  Etiology for patient stroke likely small vessel disease given risk factors.  However, given cortical location and mild cardiomyopathy, recommend 30-day cardiac event monitoring as outpatient to rule out A-fib.  Continue aspirin 81 and Plavix DAPT for 3 weeks and then aspirin alone, continue Lipitor 40.  Aggressive risk factor modification including smoking.  Smoking cessation education provided.  PT/OT recommend outpatient PT/OT.  For detailed assessment and plan, please refer to above as I have made changes wherever appropriate.   Neurology will sign off. Please call with questions. Pt will follow up with stroke clinic NP at [facility] in about 4 weeks. Thanks for the consult.   Marvel Plan, MD PhD Stroke Neurology 02/09/2022 4:40 PM    To contact Stroke Continuity provider, please refer to [email or web address]. After hours, contact General Neurology

## 2022-02-09 NOTE — Progress Notes (Signed)
Patient ID: Roger Pennington, male   DOB: [birth year], 46 y.o.   MRN: [id number] Patient enrolled for [employer] to ship a 30 day cardiac event monitor to his address on file. Letter with instructions mailed to patient.

## 2022-02-09 NOTE — Assessment & Plan Note (Signed)
Meets criteria for BMI greater than 35+ comorbidities of hypertension and diabetes mellitus

## 2022-02-09 NOTE — TOC CAGE-AID Note (Signed)
Transition of Care [facility]) - CAGE-AID Screening   Patient Details  Name: Roger Pennington MRN: [id number] Date of Birth: [birth year]  Transition of Care [facility]) CM/SW Contact:    Batoul Limes C Tarpley-Carter, [place] Phone Number: 02/09/2022, 8:53 AM   Clinical Narrative: Pt participated in [place].  Pt stated he does use substance (marijuana and cigarettes).  Pt was offered resources, due to usage of substance.    Lashaya Kienitz Tarpley-Carter, MSW, LCSW-A Pronouns:  She/Her/Hers [place] Transitions of Care Clinical Social Worker Direct Number:  [phone] [email or web address]  CAGE-AID Screening:    Have You Ever Felt You Ought to Cut Down on Your Drinking or Drug Use?: Yes Have People Annoyed You By Critizing Your Drinking Or Drug Use?: No Have You Felt Bad Or Guilty About Your Drinking Or Drug Use?: No Have You Ever Had a Drink or Used Drugs First Thing In The Morning to Steady Your Nerves or to Get Rid of a Hangover?: No CAGE-AID Score: 1  Substance Abuse Education Offered: Yes  Substance abuse interventions: Scientist, clinical (histocompatibility and immunogenetics)

## 2022-02-09 NOTE — Assessment & Plan Note (Signed)
Counseled.  Nicotine patch ?

## 2022-02-09 NOTE — Hospital Course (Signed)
46 year old male with past medical history of hypertension, diabetes mellitus, tobacco abuse presented to the emergency room on 2/22 with complaints of chest pain, right arm numbness and weakness and persistent headache for the past several days and was found to have on MRI acute versus subacute left parietal cortical infarct and a small chronic left parietal/subcortical infarct.  Blood pressure noted to be elevated in the 170s.

## 2022-02-10 LAB — HOMOCYSTEINE: Homocysteine: 12.2 umol/L (ref 0.0–14.5)

## 2022-02-10 LAB — BETA-2-GLYCOPROTEIN I ABS, IGG/M/A
Beta-2 Glyco I IgG: 95 GPI IgG units — ABNORMAL HIGH (ref 0–20)
Beta-2-Glycoprotein I IgA: 48 GPI IgA units — ABNORMAL HIGH (ref 0–25)
Beta-2-Glycoprotein I IgM: <9 GPI IgM units (ref 0–32)

## 2022-02-10 LAB — LUPUS ANTICOAGULANT PANEL
DRVVT: 32.2 s (ref 0.0–47.0)
PTT Lupus Anticoagulant: 33 s (ref 0.0–43.5)

## 2022-02-10 LAB — PROTEIN C ACTIVITY: Protein C Activity: 124 % (ref 73–180)

## 2022-02-10 LAB — PROTEIN S, TOTAL: Protein S Ag, Total: 85 % (ref 60–150)

## 2022-02-10 LAB — PROTEIN C, TOTAL: Protein C, Total: 118 % (ref 60–150)

## 2022-02-10 LAB — PROTEIN S ACTIVITY: Protein S Activity: 74 % (ref 63–140)

## 2022-02-11 ENCOUNTER — Encounter: Payer: Self-pay | Admitting: Internal Medicine

## 2022-02-12 LAB — CARDIOLIPIN ANTIBODIES, IGG, IGM, IGA
Anticardiolipin IgA: <9 APL U/mL (ref 0–11)
Anticardiolipin IgG: <9 GPL U/mL (ref 0–14)
Anticardiolipin IgM: <9 MPL U/mL (ref 0–12)

## 2022-02-13 LAB — PROTHROMBIN GENE MUTATION

## 2022-02-13 LAB — FACTOR 5 LEIDEN

## 2022-02-14 NOTE — Assessment & Plan Note (Signed)
Incidentally noted on patient's echocardiogram.  BNP checked and found to be within normal limits.  Euvolemic.  Discharged on low-dose lisinopril and beta-blocker.  Outpatient follow-up with cardiology.

## 2022-02-14 NOTE — Assessment & Plan Note (Signed)
Continue home medications.  A1c at 7.0 indicating good control.

## 2022-02-14 NOTE — Assessment & Plan Note (Signed)
In the past had been noncompliant with medications at times.  Issues that he will no longer be.  Discharged on beta-blocker and ACE inhibitor.

## 2022-02-14 NOTE — Discharge Summary (Signed)
Physician Discharge Summary   Patient: Roger Pennington MRN: [id number] DOB: May 24, 1976  Admit date:     02/08/2022  Discharge date: 02/09/2022  Discharge Physician: Hollice Espy   PCP: [facility]   Recommendations at discharge:   New medication: Lipitor 40 mg p.o. daily New medication: Aspirin 81 mg and Plavix 75 mg p.o. daily x3 weeks, then aspirin only indefinitely New medication: Coreg 3.125 p.o. twice daily New medication: Lisinopril 5 mg p.o. daily Patient given referral for outpatient physical therapy Patient will follow-up with cardiology in the next few months Cardiology follow-up with patient for outpatient event monitor Patient to follow-up with neurology in the next 4 to 6 weeks Patient cleared to return back to work on 02/13/2022  Discharge Diagnoses: Principal Problem:   Stroke (cerebrum) (HCC) Active Problems:   Chronic combined systolic (congestive) and diastolic (congestive) heart failure (HCC)   Diabetes mellitus without complication (HCC)   Essential hypertension   Tobacco abuse   Severe recurrent major depression without psychotic features (HCC)   Morbid obesity (HCC)   Atypical chest pain  Resolved Problems:   * No resolved hospital problems. *   Hospital Course: 46 year old male with past medical history of hypertension, diabetes mellitus, tobacco abuse presented to the emergency room on 2/22 with complaints of chest pain, right arm numbness and weakness and persistent headache for the past several days and was found to have on MRI acute versus subacute left parietal cortical infarct and a small chronic left parietal/subcortical infarct.  Blood pressure noted to be elevated in the 170s.  Assessment and Plan: * Stroke (cerebrum) (HCC)- (present on admission) Work-up in progress.  Checking MRA, likely secondary to uncontrolled hypertension/small vessel disease.  LDL at 110, target below 70.  A1c at 7.0.  Started on statin.   Seen by PT and OT recommended outpatient PT.  Cleared by speech.  Counseled to quit smoking.  Recommend 30-day event monitor as outpatient to rule out atrial fibrillation, being set up with cardiology.  Patient will be discharged on aspirin 81 mg plus Plavix 75 mg x 3 weeks, then aspirin alone  Chronic combined systolic (congestive) and diastolic (congestive) heart failure (HCC)- (present on admission) Incidentally noted on patient's echocardiogram.  BNP checked and found to be within normal limits.  Euvolemic.  Discharged on low-dose lisinopril and beta-blocker.  Outpatient follow-up with cardiology.  Diabetes mellitus without complication (HCC) Continue home medications.  A1c at 7.0 indicating good control.  Essential hypertension- (present on admission) In the past had been noncompliant with medications at times.  Issues that he will no longer be.  Discharged on beta-blocker and ACE inhibitor.  Tobacco abuse- (present on admission) Counseled.  Nicotine patch.  Morbid obesity (HCC)- (present on admission) Meets criteria for BMI greater than 35+ comorbidities of hypertension and diabetes mellitus           Consultants: Neurology Procedures performed: Echocardiogram Disposition: Home Diet recommendation:  Discharge Diet Orders (From admission, onward)     Start     Ordered   02/09/22 0000  Diet - low sodium heart healthy        02/09/22 1537           Cardiac and Carb modified diet  DISCHARGE MEDICATION: Allergies as of 02/09/2022   No Known Allergies      Medication List     STOP taking these medications    Acidophilus Probiotic 10 MG Caps   Bactroban Nasal 2 % Generic drug:  mupirocin nasal ointment   FLUoxetine 20 MG capsule Commonly known as: PROZAC   ibuprofen 400 MG tablet Commonly known as: ADVIL   metFORMIN 1000 MG tablet Commonly known as: Glucophage   naproxen sodium 220 MG tablet Commonly known as: ALEVE       TAKE these medications     acetaminophen 500 MG tablet Commonly known as: TYLENOL Take 500 mg by mouth every 6 (six) hours as needed for moderate pain.   aspirin 81 MG EC tablet Take 1 tablet (81 mg total) by mouth daily. Swallow whole.   atorvastatin 80 MG tablet Commonly known as: LIPITOR Take 1 tablet (80 mg total) by mouth daily.   carvedilol 3.125 MG tablet Commonly known as: Coreg Take 1 tablet (3.125 mg total) by mouth 2 (two) times daily.   clopidogrel 75 MG tablet Commonly known as: PLAVIX Take 1 tablet (75 mg total) by mouth daily.   clotrimazole 1 % cream Commonly known as: LOTRIMIN Apply 1 application topically 2 (two) times daily. Apply to pt's feet   famotidine 10 MG tablet Commonly known as: PEPCID Take 1 tablet (10 mg total) by mouth as needed for heartburn or indigestion.   Insulin Pen Needle 32G X 4 MM Misc 90 Units by Does not apply route 3 (three) times daily.   Lantus SoloStar 100 UNIT/ML Solostar Pen Generic drug: insulin glargine Inject 50 Units into the skin daily. What changed: Another medication with the same name was removed. Continue taking this medication, and follow the directions you see here.   lisinopril 5 MG tablet Commonly known as: ZESTRIL Take 1 tablet (5 mg total) by mouth daily.   nicotine 21 mg/24hr patch Commonly known as: NICODERM CQ - dosed in mg/24 hours Place 1 patch (21 mg total) onto the skin daily.   Tradjenta 5 MG Tabs tablet Generic drug: linagliptin Take 5 mg by mouth daily.        Follow-up Information     [facility] Follow up in 1 week(s).   Specialty: Rehabilitation Contact information: [address] [id number] mc [place] [address] [phone]        Christell Constant, MD. Schedule an appointment as soon as possible for a visit in 1 month(s).   Specialty: Cardiology Contact information: [address] [place]  [address] [phone]         [facility]. Schedule an appointment as soon as possible for a visit in 1 month(s).   Specialty: Neurology Why: stroke clinic Contact information: [address] [place] [address] [phone]                Discharge Exam: Ceasar Mons Weights   02/08/22 0836  Weight: 120.2 kg   General: Alert and oriented x3, no acute distress Cardiovascular: Regular rate and rhythm, S1-S2 Lungs: Clear to auscultation bilaterally  Condition at discharge: good  The results of significant diagnostics from this hospitalization (including imaging, microbiology, ancillary and laboratory) are listed below for reference.   Imaging Studies: DG Chest 2 View  Result Date: 02/08/2022 CLINICAL DATA:  Chest pain, right-sided numbness EXAM: CHEST - 2 VIEW COMPARISON:  Chest radiograph 05/24/2020 FINDINGS: The cardiomediastinal silhouette is within normal limits. There is no focal consolidation or pulmonary edema. There is no pleural effusion or pneumothorax. There is no acute osseous abnormality. IMPRESSION: No radiographic evidence of acute cardiopulmonary process. Electronically Signed   By: Lesia Hausen M.D.   On: 02/08/2022  08:35   CT Head Wo Contrast  Result Date: 02/08/2022 CLINICAL DATA:  Severe headache EXAM: CT HEAD WITHOUT CONTRAST TECHNIQUE: Contiguous axial images were obtained from the base of the skull through the vertex without intravenous contrast. RADIATION DOSE REDUCTION: This exam was performed according to the departmental dose-optimization program which includes automated exposure control, adjustment of the mA and/or kV according to patient size and/or use of iterative reconstruction technique. COMPARISON:  Brain MRI 06/01/2021, CT head 06/01/2021 FINDINGS: Brain: There is no evidence of acute intracranial hemorrhage, extra-axial fluid collection, or acute infarct. Parenchymal volume is normal. The ventricles are  normal in size. Gray-white differentiation is preserved. There is no mass lesion. There is no midline shift. Vascular: No hyperdense vessel or unexpected calcification. Skull: Normal. Negative for fracture or focal lesion. Sinuses/Orbits: There is mild mucosal thickening in the left sphenoid sinus. The globes and orbits are unremarkable. Other: There are bilateral mastoid effusions and a left middle ear effusion, unchanged since 2022. IMPRESSION: 1. No acute intracranial pathology. Normal noncontrast appearance of the brain. 2. Unchanged bilateral mastoid and left middle ear effusions. Electronically Signed   By: Lesia Hausen M.D.   On: 02/08/2022 09:24   MR ANGIO HEAD WO CONTRAST  Result Date: 02/08/2022 CLINICAL DATA:  Provided history: Stroke, follow-up. EXAM: MRA HEAD WITHOUT CONTRAST TECHNIQUE: Angiographic images of the Circle of Willis were acquired using MRA technique without intravenous contrast. COMPARISON:  Brain MRI 02/08/2022. FINDINGS: Anterior circulation: The intracranial internal carotid arteries are patent. The M1 middle cerebral arteries are patent. No M2 proximal branch occlusion or high-grade proximal stenosis is identified. The anterior cerebral arteries are patent. No intracranial aneurysm is identified. Posterior circulation: The intracranial vertebral arteries are patent. The basilar artery is patent. The posterior cerebral arteries are patent. Both posterior cerebral arteries demonstrate apparent distal branch atherosclerotic irregularity. Posterior communicating arteries are present bilaterally. Anatomic variants: None significant. IMPRESSION: No intracranial large vessel occlusion or proximal high-grade arterial stenosis. Both posterior cerebral arteries demonstrate apparent distal branch atherosclerotic irregularity. Electronically Signed   By: Jackey Loge D.O.   On: 02/08/2022 15:13   MR ANGIO NECK WO CONTRAST  Result Date: 02/08/2022 CLINICAL DATA:  Stroke, follow-up EXAM: MRA  NECK WITHOUT CONTRAST TECHNIQUE: Angiographic images of the neck were acquired using MRA technique without intravenous contrast. Carotid stenosis measurements (when applicable) are obtained utilizing NASCET criteria, using the distal internal carotid diameter as the denominator. COMPARISON:  No pertinent prior exam. FINDINGS: Aortic arch: Not included Right carotid system: Included portions of the common carotids are patent. Internal and external carotids are patent. No stenosis at the ICA origins. Left carotid system: Included portions of the common carotids are patent. Internal and external carotids are patent. No stenosis at the ICA origins. Vertebral arteries: Included portions are patent and codominant. No stenosis. Other: None. IMPRESSION: No large vessel occlusion or hemodynamically significant stenosis. Electronically Signed   By: Guadlupe Spanish M.D.   On: 02/08/2022 15:19   MR BRAIN WO CONTRAST  Result Date: 02/08/2022 CLINICAL DATA:  Neuro deficit, acute, stroke suspected EXAM: MRI HEAD WITHOUT CONTRAST TECHNIQUE: Multiplanar, multiecho pulse sequences of the brain and surrounding structures were obtained without intravenous contrast. COMPARISON:  06/01/2021 FINDINGS: Brain: Punctate focus of diffusion hyperintensity is present in the left parietal lobe likely involving the cortex (series 2, image 40). No evidence of intracranial hemorrhage. There is no intracranial mass, mass effect, or edema. There is no hydrocephalus or extra-axial fluid collection. Ventricles and sulci are normal in  size and configuration. Small chronic cortical/subcortical left parietal infarct. Vascular: Major vessel flow voids at the skull base are preserved. Skull and upper cervical spine: Normal marrow signal is preserved. Sinuses/Orbits: Minor mucosal thickening.  Orbits are unremarkable. Other: Sella is unremarkable. Bilateral mastoid effusions. Prominence of the adenoids. IMPRESSION: Punctate acute or subacute left parietal  cortical infarct. No hemorrhage or mass. Small chronic left parietal cortical/subcortical infarct. Bilateral mastoid effusions.  Prominent adenoids. Electronically Signed   By: Guadlupe Spanish M.D.   On: 02/08/2022 11:30   MR Cervical Spine Wo Contrast  Result Date: 02/08/2022 CLINICAL DATA:  Provided history: Cervical radiculopathy, no red flags. Additional history provided by scanning technologist: Patient reports headache and chest pain since yesterday, headache for 3 days. EXAM: MRI CERVICAL SPINE WITHOUT CONTRAST TECHNIQUE: Multiplanar, multisequence MR imaging of the cervical spine was performed. No intravenous contrast was administered. COMPARISON:  Concurrently performed brain MRI 02/08/2022. FINDINGS: Mild intermittent motion degradation. Alignment: Straightening of the expected cervical lordosis. No significant spondylolisthesis. Vertebrae: Vertebral body height is maintained. No significant marrow edema or focal suspicious osseous lesion. Ventral osteophytes at C4-C5 and C5-C6. Cord: No signal abnormality identified within the cervical spinal cord. Posterior Fossa, vertebral arteries, paraspinal tissues: Posterior fossa better assessed on same-day brain MRI. Nonspecific prominence of the nasopharyngeal/adenoid soft tissues. Flow voids preserved within the imaged cervical vertebral arteries. Paraspinal soft tissues unremarkable. Disc levels: No more than mild disc degeneration within the cervical spine. C2-C3: No significant disc herniation or stenosis. C3-C4: No significant disc herniation or spinal canal stenosis. Left-sided uncovertebral hypertrophy results in mild to moderate left neural foraminal narrowing. C4-C5: No significant disc herniation or spinal canal stenosis. Left-sided uncovertebral hypertrophy results in mild left neural foraminal narrowing. C5-C6: No significant disc herniation or stenosis. C6-C7: No significant disc herniation or stenosis. C7-T1: No significant disc herniation or  stenosis. IMPRESSION: Mildly motion degraded exam. Cervical spondylosis, as outlined. No significant spinal canal stenosis. Uncovertebral hypertrophy results in mild-to-moderate left neural foraminal narrowing at C3-C4, and mild left neural foraminal narrowing at C4-C5. No more than mild disc degeneration. Nonspecific straightening of the expected cervical lordosis. Electronically Signed   By: Jackey Loge D.O.   On: 02/08/2022 11:29   ECHOCARDIOGRAM COMPLETE  Result Date: 02/08/2022    ECHOCARDIOGRAM REPORT   Patient Name:   PRENTISS POLIO Date of Exam: 02/08/2022 Medical Rec #:  [id number]          Height:       72.0 in Accession #:    [id number]         Weight:       265.0 lb Date of Birth:  Apr 28, 1976           BSA:          2.401 m Patient Age:    46 years           BP:           177/99 mmHg Patient Gender: M                  HR:           65 bpm. Exam Location:  Inpatient Procedure: 2D Echo Indications:    Stroke  History:        Patient has prior history of Echocardiogram examinations, most                 recent 05/25/2020. Risk Factors:Diabetes and Hypertension.  Sonographer:    Devonne Doughty Referring Phys:  [id number] PING T ZHANG IMPRESSIONS  1. Left ventricular ejection fraction, by estimation, is 45 to 50%. The left ventricle has mildly decreased function. The left ventricle demonstrates global hypokinesis. Left ventricular diastolic parameters are consistent with Grade I diastolic dysfunction (impaired relaxation).  2. Right ventricular systolic function is normal. The right ventricular size is mildly enlarged. There is normal pulmonary artery systolic pressure.  3. The mitral valve is normal in structure. Trivial mitral valve regurgitation. No evidence of mitral stenosis.  4. The aortic valve is normal in structure. Aortic valve regurgitation is not visualized. No aortic stenosis is present.  5. The inferior vena cava is normal in size with greater than 50% respiratory variability, suggesting right  atrial pressure of 3 mmHg. Comparison(s): No significant change from prior study. Prior images reviewed side by side. FINDINGS  Left Ventricle: Left ventricular ejection fraction, by estimation, is 45 to 50%. The left ventricle has mildly decreased function. The left ventricle demonstrates global hypokinesis. The left ventricular internal cavity size was normal in size. There is  no left ventricular hypertrophy. Left ventricular diastolic parameters are consistent with Grade I diastolic dysfunction (impaired relaxation). Right Ventricle: The right ventricular size is mildly enlarged. No increase in right ventricular wall thickness. Right ventricular systolic function is normal. There is normal pulmonary artery systolic pressure. The tricuspid regurgitant velocity is 2.11  m/s, and with an assumed right atrial pressure of 3 mmHg, the estimated right ventricular systolic pressure is 20.8 mmHg. Left Atrium: Left atrial size was normal in size. Right Atrium: Right atrial size was normal in size. Pericardium: There is no evidence of pericardial effusion. Mitral Valve: The mitral valve is normal in structure. Trivial mitral valve regurgitation. No evidence of mitral valve stenosis. Tricuspid Valve: The tricuspid valve is normal in structure. Tricuspid valve regurgitation is trivial. No evidence of tricuspid stenosis. Aortic Valve: The aortic valve is normal in structure. Aortic valve regurgitation is not visualized. No aortic stenosis is present. Aortic valve mean gradient measures 2.0 mmHg. Aortic valve peak gradient measures 5.0 mmHg. Aortic valve area, by VTI measures 3.66 cm. Pulmonic Valve: The pulmonic valve was normal in structure. Pulmonic valve regurgitation is not visualized. No evidence of pulmonic stenosis. Aorta: The aortic root is normal in size and structure. Venous: The inferior vena cava is normal in size with greater than 50% respiratory variability, suggesting right atrial pressure of 3 mmHg.  IAS/Shunts: No atrial level shunt detected by color flow Doppler.  LEFT VENTRICLE PLAX 2D LVIDd:         4.96 cm   Diastology LVIDs:         3.70 cm   LV e' medial:    6.20 cm/s LV PW:         1.02 cm   LV E/e' medial:  7.2 LV IVS:        1.02 cm   LV e' lateral:   6.85 cm/s LVOT diam:     2.20 cm   LV E/e' lateral: 6.6 LV SV:         90 LV SV Index:   37 LVOT Area:     3.80 cm  RIGHT VENTRICLE RV Basal diam:  4.19 cm RV Mid diam:    3.60 cm RV S prime:     9.25 cm/s TAPSE (M-mode): 1.8 cm LEFT ATRIUM             Index        RIGHT ATRIUM  Index LA diam:        3.80 cm 1.58 cm/m   RA Area:     21.80 cm LA Vol (A2C):   52.5 ml 21.86 ml/m  RA Volume:   68.10 ml  28.36 ml/m LA Vol (A4C):   57.8 ml 24.07 ml/m LA Biplane Vol: 55.4 ml 23.07 ml/m  AORTIC VALVE AV Area (Vmax):    3.73 cm AV Area (Vmean):   3.57 cm AV Area (VTI):     3.66 cm AV Vmax:           112.00 cm/s AV Vmean:          73.600 cm/s AV VTI:            0.245 m AV Peak Grad:      5.0 mmHg AV Mean Grad:      2.0 mmHg LVOT Vmax:         110.00 cm/s LVOT Vmean:        69.200 cm/s LVOT VTI:          0.236 m LVOT/AV VTI ratio: 0.96  AORTA Ao Root diam: 3.20 cm Ao Asc diam:  3.50 cm MITRAL VALVE               TRICUSPID VALVE MV Area (PHT): 2.22 cm    TR Peak grad:   17.8 mmHg MV Decel Time: 342 msec    TR Vmax:        211.00 cm/s MV E velocity: 44.90 cm/s MV A velocity: 58.60 cm/s  SHUNTS MV E/A ratio:  0.77        Systemic VTI:  0.24 m                            Systemic Diam: 2.20 cm Donato Schultz MD Electronically signed by Donato Schultz MD Signature Date/Time: 02/08/2022/2:06:43 PM    Final     Microbiology: Results for orders placed or performed during the hospital encounter of 02/08/22  Resp Panel by RT-PCR (Flu A&B, Covid) Nasopharyngeal Swab     Status: None   Collection Time: 02/08/22  3:20 PM   Specimen: Nasopharyngeal Swab; Nasopharyngeal(NP) swabs in vial transport medium  Result Value Ref Range Status   SARS Coronavirus 2 by RT  PCR NEGATIVE NEGATIVE Final    Comment: (NOTE) SARS-CoV-2 target nucleic acids are NOT DETECTED.  The SARS-CoV-2 RNA is generally detectable in upper respiratory specimens during the acute phase of infection. The lowest concentration of SARS-CoV-2 viral copies this assay can detect is 138 copies/mL. A negative result does not preclude SARS-Cov-2 infection and should not be used as the sole basis for treatment or other patient management decisions. A negative result may occur with  improper specimen collection/handling, submission of specimen other than nasopharyngeal swab, presence of viral mutation(s) within the areas targeted by this assay, and inadequate number of viral copies(<138 copies/mL). A negative result must be combined with clinical observations, patient history, and epidemiological information. The expected result is Negative.  Fact Sheet for Patients:  BloggerCourse.com  Fact Sheet for Healthcare Providers:  SeriousBroker.it  This test is no t yet approved or cleared by the [place] FDA and  has been authorized for detection and/or diagnosis of SARS-CoV-2 by FDA under an Emergency Use Authorization (EUA). This EUA will remain  in effect (meaning this test can be used) for the duration of the COVID-19 declaration under Section 564(b)(1) of the Act, 21 U.S.C.section 360bbb-3(b)(1), unless  the authorization is terminated  or revoked sooner.       Influenza A by PCR NEGATIVE NEGATIVE Final   Influenza B by PCR NEGATIVE NEGATIVE Final    Comment: (NOTE) The Xpert Xpress SARS-CoV-2/FLU/RSV plus assay is intended as an aid in the diagnosis of influenza from Nasopharyngeal swab specimens and should not be used as a sole basis for treatment. Nasal washings and aspirates are unacceptable for Xpert Xpress SARS-CoV-2/FLU/RSV testing.  Fact Sheet for Patients: BloggerCourse.com  Fact Sheet for  Healthcare Providers: SeriousBroker.it  This test is not yet approved or cleared by the [place] FDA and has been authorized for detection and/or diagnosis of SARS-CoV-2 by FDA under an Emergency Use Authorization (EUA). This EUA will remain in effect (meaning this test can be used) for the duration of the COVID-19 declaration under Section 564(b)(1) of the Act, 21 U.S.C. section 360bbb-3(b)(1), unless the authorization is terminated or revoked.  Performed at [facility], [address], [place], [place] [address]     Labs: CBC: Recent Labs  Lab 02/08/22 0810  WBC 5.3  HGB 12.9*  HCT 41.5  MCV 80.9  PLT 212   Basic Metabolic Panel: Recent Labs  Lab 02/08/22 0810  NA 135  K 4.0  CL 103  CO2 23  GLUCOSE 236*  BUN 8  CREATININE 1.04  CALCIUM 8.9   Liver Function Tests: Recent Labs  Lab 02/08/22 0810  AST 26  ALT 27  ALKPHOS 60  BILITOT 0.6  PROT 7.2  ALBUMIN 4.1   CBG: Recent Labs  Lab 02/08/22 1725 02/08/22 2111 02/09/22 0633 02/09/22 1224 02/09/22 1611  GLUCAP 157* 166* 125* 70 96    Discharge time spent: greater than 30 minutes.  Signed: Hollice Espy, MD [facility] 02/14/2022

## 2022-02-19 ENCOUNTER — Ambulatory Visit (INDEPENDENT_AMBULATORY_CARE_PROVIDER_SITE_OTHER): Payer: BC Managed Care – PPO

## 2022-02-19 DIAGNOSIS — I4891 Unspecified atrial fibrillation: Secondary | ICD-10-CM | POA: Diagnosis not present

## 2022-02-19 DIAGNOSIS — I639 Cerebral infarction, unspecified: Secondary | ICD-10-CM

## 2022-03-17 ENCOUNTER — Encounter: Payer: Self-pay | Admitting: Adult Health

## 2022-03-17 ENCOUNTER — Inpatient Hospital Stay: Payer: Self-pay | Admitting: Adult Health

## 2022-03-17 NOTE — Progress Notes (Deleted)
? ? ?PATIENT: Roger Pennington ?DOB: 1976/01/28 ? ?REASON FOR VISIT: follow up ?HISTORY FROM: patient ?PRIMARY NEUROLOGIST:  ? ?HISTORY OF PRESENT ILLNESS: ?Today 03/17/22 ? ?HISTORY  ? ?REVIEW OF SYSTEMS: Out of a complete 14 system review of symptoms, the patient complains only of the following symptoms, and all other reviewed systems are negative. ? ?ALLERGIES: ?No Known Allergies ? ?HOME MEDICATIONS: ?Outpatient Medications Prior to Visit  ?Medication Sig Dispense Refill  ? acetaminophen (TYLENOL) 500 MG tablet Take 500 mg by mouth every 6 (six) hours as needed for moderate pain.    ? aspirin EC 81 MG EC tablet Take 1 tablet (81 mg total) by mouth daily. Swallow whole. 30 tablet 11  ? atorvastatin (LIPITOR) 80 MG tablet Take 1 tablet (80 mg total) by mouth daily. 30 tablet 1  ? carvedilol (COREG) 3.125 MG tablet Take 1 tablet (3.125 mg total) by mouth 2 (two) times daily. 60 tablet 11  ? clopidogrel (PLAVIX) 75 MG tablet Take 1 tablet (75 mg total) by mouth daily. 21 tablet 0  ? clotrimazole (LOTRIMIN) 1 % cream Apply 1 application topically 2 (two) times daily. Apply to pt's feet    ? famotidine (PEPCID) 10 MG tablet Take 1 tablet (10 mg total) by mouth as needed for heartburn or indigestion. 30 tablet 1  ? insulin glargine (LANTUS SOLOSTAR) 100 UNIT/ML Solostar Pen Inject 50 Units into the skin daily.    ? Insulin Pen Needle 32G X 4 MM MISC 90 Units by Does not apply route 3 (three) times daily. 90 each 10  ? lisinopril (ZESTRIL) 5 MG tablet Take 1 tablet (5 mg total) by mouth daily. 30 tablet 11  ? nicotine (NICODERM CQ - DOSED IN MG/24 HOURS) 21 mg/24hr patch Place 1 patch (21 mg total) onto the skin daily. 28 patch 0  ? TRADJENTA 5 MG TABS tablet Take 5 mg by mouth daily.    ? ?No facility-administered medications prior to visit.  ? ? ?PAST MEDICAL HISTORY: ?Past Medical History:  ?Diagnosis Date  ? Diabetes mellitus without complication (HCC)   ? Hypertension   ? Tobacco abuse   ? ? ?PAST SURGICAL  HISTORY: ?Past Surgical History:  ?Procedure Laterality Date  ? ESOPHAGOGASTRODUODENOSCOPY (EGD) WITH PROPOFOL N/A 02/09/2016  ? Procedure: ESOPHAGOGASTRODUODENOSCOPY (EGD) WITH PROPOFOL;  Surgeon: Christena Deem, MD;  Location: [facility] ENDOSCOPY;  Service: Endoscopy;  Laterality: N/A;  ? INCISION AND DRAINAGE ABSCESS Right 05/25/2020  ? Procedure: INCISION AND DRAINAGE ABSCESS;  Surgeon: Leafy Ro, MD;  Location: [facility] ORS;  Service: General;  Laterality: Right;  ? ? ?FAMILY HISTORY: ?Family History  ?Problem Relation Age of Onset  ? Hypertension Father   ? Hypertension Brother   ? ? ?SOCIAL HISTORY: ?Social History  ? ?Socioeconomic History  ? Marital status: Single  ?  Spouse name: Not on file  ? Number of children: Not on file  ? Years of education: Not on file  ? Highest education level: Not on file  ?Occupational History  ? Not on file  ?Tobacco Use  ? Smoking status: Former  ?  Types: Cigarettes  ? Smokeless tobacco: Never  ?Substance and Sexual Activity  ? Alcohol use: Yes  ?  Alcohol/week: 0.0 standard drinks  ?  Comment: occ  ? Drug use: Yes  ?  Types: Marijuana  ?  Comment: occ  ? Sexual activity: Not on file  ?Other Topics Concern  ? Not on file  ?Social History Narrative  ? Not on  file  ? ?Social Determinants of Health  ? ?Financial Resource Strain: Not on file  ?Food Insecurity: Not on file  ?Transportation Needs: Not on file  ?Physical Activity: Not on file  ?Stress: Not on file  ?Social Connections: Not on file  ?Intimate Partner Violence: Not on file  ? ? ? ? ?PHYSICAL EXAM ? ?There were no vitals filed for this visit. ?There is no height or weight on file to calculate BMI. ? ?Generalized: Well developed, in no acute distress  ? ?Neurological examination  ?Mentation: Alert oriented to time, place, history taking. Follows all commands speech and language fluent ?Cranial nerve II-XII: Pupils were equal round reactive to light. Extraocular movements were full, visual field were full on confrontational  test. Facial sensation and strength were normal. Uvula tongue midline. Head turning and shoulder shrug  were normal and symmetric. ?Motor: The motor testing reveals 5 over 5 strength of all 4 extremities. Good symmetric motor tone is noted throughout.  ?Sensory: Sensory testing is intact to soft touch on all 4 extremities. No evidence of extinction is noted.  ?Coordination: Cerebellar testing reveals good finger-nose-finger and heel-to-shin bilaterally.  ?Gait and station: Gait is normal. Tandem gait is normal. Romberg is negative. No drift is seen.  ?Reflexes: Deep tendon reflexes are symmetric and normal bilaterally.  ? ?DIAGNOSTIC DATA (LABS, IMAGING, TESTING) ?- I reviewed patient records, labs, notes, testing and imaging myself where available. ? ?Lab Results  ?Component Value Date  ? WBC 5.3 02/08/2022  ? HGB 12.9 (L) 02/08/2022  ? HCT 41.5 02/08/2022  ? MCV 80.9 02/08/2022  ? PLT 212 02/08/2022  ? ?   ?Component Value Date/Time  ? NA 135 02/08/2022 0810  ? NA 139 04/16/2015 1125  ? K 4.0 02/08/2022 0810  ? K 3.7 04/16/2015 1125  ? CL 103 02/08/2022 0810  ? CL 109 04/16/2015 1125  ? CO2 23 02/08/2022 0810  ? CO2 25 04/16/2015 1125  ? GLUCOSE 236 (H) 02/08/2022 0810  ? GLUCOSE 129 (H) 04/16/2015 1125  ? BUN 8 02/08/2022 0810  ? BUN 10 04/16/2015 1125  ? CREATININE 1.04 02/08/2022 0810  ? CREATININE 1.03 04/16/2015 1125  ? CALCIUM 8.9 02/08/2022 0810  ? CALCIUM 8.7 (L) 04/16/2015 1125  ? PROT 7.2 02/08/2022 0810  ? PROT 7.3 04/16/2015 1125  ? ALBUMIN 4.1 02/08/2022 0810  ? ALBUMIN 4.3 04/16/2015 1125  ? AST 26 02/08/2022 0810  ? AST 19 04/16/2015 1125  ? ALT 27 02/08/2022 0810  ? ALT 18 04/16/2015 1125  ? ALKPHOS 60 02/08/2022 0810  ? ALKPHOS 73 04/16/2015 1125  ? BILITOT 0.6 02/08/2022 0810  ? BILITOT 0.7 04/16/2015 1125  ? GFRNONAA >60 02/08/2022 0810  ? GFRNONAA >60 04/16/2015 1125  ? GFRAA >60 09/02/2020 1317  ? GFRAA >60 04/16/2015 1125  ? ?Lab Results  ?Component Value Date  ? CHOL 169 02/09/2022  ? HDL 32  (L) 02/09/2022  ? LDLCALC 110 (H) 02/09/2022  ? TRIG 135 02/09/2022  ? CHOLHDL 5.3 02/09/2022  ? ?Lab Results  ?Component Value Date  ? HGBA1C 7.0 (H) 02/08/2022  ? ?No results found for: VITAMINB12 ?Lab Results  ?Component Value Date  ? TSH 0.792 07/02/2017  ? ? ? ? ?ASSESSMENT AND PLAN ?46 y.o. year old male  has a past medical history of Diabetes mellitus without complication (HCC), Hypertension, and Tobacco abuse. here with: ? ? ? ? ? ? ?Butch Penny, MSN, NP-C 03/17/2022, 8:50 AM ?[facility] ?[address], [address] ?  [place], [place] [address] ?[phone] ? ?

## 2022-03-24 ENCOUNTER — Other Ambulatory Visit: Payer: Self-pay | Admitting: Cardiology

## 2022-03-24 DIAGNOSIS — I4891 Unspecified atrial fibrillation: Secondary | ICD-10-CM

## 2022-03-24 DIAGNOSIS — I639 Cerebral infarction, unspecified: Secondary | ICD-10-CM

## 2022-04-03 NOTE — Progress Notes (Signed)
?Cardiology Office Note:   ? ?Date:  04/04/2022  ? ?ID:  Roger Pennington, DOB [birth year], MRN [id number] ? ?PCP:  [facility] ?  ?[facility] Providers ?Cardiologist:  None    ? ?Referring MD: [facility]  ? ?CC: Follow up stroke ?Consulted for the evaluation of HFmrEF at the behest of [facility] ? ? ?History of Present Illness:   ? ?Roger Pennington is a 46 y.o. 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 ?2023: LVEF 45%, No AF on monitor. ? ?Patient notes that he is feeling great.  He has been having chest pain that radiates to his arm. Patient exertion notable for working  with wood and is able to pull 70 lbs.  There is no pain related lifting.  Sometimes that pain is after lifting.  Sometimes is occurs in the middle of the night.   ? ? ?No shortness of breath, DOE .  No PND or orthopnea.  No weight gain, leg swelling , or abdominal swelling.  No syncope or near syncope . Notes  no palpitations or funny heart beats.    ? ?Notes that he and a history of gastric bleeding ulcers.  He is having a return of blood in his stool.  He is having hematospermia.  This has occurred since DAPT.  He is on DAPT.  He was seen by a doctors NOS last Tuesday: we was told to stop of of his medications that begins with an L.  He is unclear of what this medication is. ? ? ?Past Medical History:  ?Diagnosis Date  ? Diabetes mellitus without complication (HCC)   ? Hypertension   ? Tobacco abuse   ? ? ?Past Surgical History:  ?Procedure Laterality Date  ? ESOPHAGOGASTRODUODENOSCOPY (EGD) WITH PROPOFOL N/A 02/09/2016  ? Procedure: ESOPHAGOGASTRODUODENOSCOPY (EGD) WITH PROPOFOL;  Surgeon: Christena Deem, MD;  Location: [facility] ENDOSCOPY;  Service: Endoscopy;  Laterality: N/A;  ? INCISION AND DRAINAGE ABSCESS Right 05/25/2020  ? Procedure: INCISION AND DRAINAGE  ABSCESS;  Surgeon: Leafy Ro, MD;  Location: [facility] ORS;  Service: General;  Laterality: Right;  ? ? ?Current Medications: ?Current Meds  ?Medication Sig  ? acetaminophen (TYLENOL) 500 MG tablet Take 500 mg by mouth every 6 (six) hours as needed for moderate pain.  ? aspirin EC 81 MG EC tablet Take 1 tablet (81 mg total) by mouth daily. Swallow whole.  ? atorvastatin (LIPITOR) 80 MG tablet Take 1 tablet (80 mg total) by mouth daily.  ? carvedilol (COREG) 3.125 MG tablet Take 1 tablet (3.125 mg total) by mouth 2 (two) times daily.  ? clotrimazole (LOTRIMIN) 1 % cream Apply 1 application topically 2 (two) times daily. Apply to pt's feet  ? famotidine (PEPCID) 10 MG tablet Take 1 tablet (10 mg total) by mouth as needed for heartburn or indigestion.  ? insulin glargine (LANTUS SOLOSTAR) 100 UNIT/ML Solostar Pen Inject 50 Units into the skin daily.  ? Insulin Pen Needle 32G X 4 MM MISC 90 Units by Does not apply route 3 (three) times daily.  ? lisinopril (ZESTRIL) 5 MG tablet Take 1 tablet (5 mg total) by mouth daily.  ? metoprolol tartrate (LOPRESSOR) 100 MG tablet Take 2 hours prior to Cardiac CT  ? nicotine (NICODERM CQ - DOSED IN MG/24 HOURS) 21 mg/24hr patch Place 1 patch (21  mg total) onto the skin daily.  ? TRADJENTA 5 MG TABS tablet Take 5 mg by mouth daily.  ? [DISCONTINUED] clopidogrel (PLAVIX) 75 MG tablet Take 1 tablet (75 mg total) by mouth daily.  ?  ? ?Allergies:   Patient has no known allergies.  ? ?Social History  ? ?Socioeconomic History  ? Marital status: Single  ?  Spouse name: Not on file  ? Number of children: Not on file  ? Years of education: Not on file  ? Highest education level: Not on file  ?Occupational History  ? Not on file  ?Tobacco Use  ? Smoking status: Former  ?  Types: Cigarettes  ? Smokeless tobacco: Never  ?Substance and Sexual Activity  ? Alcohol use: Yes  ?  Alcohol/week: 0.0 standard drinks  ?  Comment: occ  ? Drug use: Yes  ?  Types: Marijuana  ?  Comment: occ  ? Sexual  activity: Not on file  ?Other Topics Concern  ? Not on file  ?Social History Narrative  ? Not on file  ? ?Social Determinants of Health  ? ?Financial Resource Strain: Not on file  ?Food Insecurity: Not on file  ?Transportation Needs: Not on file  ?Physical Activity: Not on file  ?Stress: Not on file  ?Social Connections: Not on file  ?  ? ?Family History: ?The patient's family history includes Hypertension in his brother and father. ? ?ROS:   ?Please see the history of present illness.    ? All other systems reviewed and are negative. ? ?EKGs/Labs/Other Studies Reviewed:   ? ?The following studies were reviewed today: ? ?EKG:  EKG is  ordered today.  The ekg ordered today demonstrates SR rate 72 ?NON Con CT 2021- no CAC or Aortic atherosclerosis. ? ?Recent Labs: ?02/08/2022: ALT 27; BUN 8; Creatinine, Ser 1.04; Hemoglobin 12.9; Platelets 212; Potassium 4.0; Sodium 135  ?Recent Lipid Panel ?   ?Component Value Date/Time  ? CHOL 169 02/09/2022 0554  ? TRIG 135 02/09/2022 0554  ? HDL 32 (L) 02/09/2022 0554  ? CHOLHDL 5.3 02/09/2022 0554  ? VLDL 27 02/09/2022 0554  ? LDLCALC 110 (H) 02/09/2022 0554  ? ?    ? ?Physical Exam:   ? ?VS:  BP (!) 156/102   Pulse 72   Ht 6' (1.829 m)   Wt 260 lb (117.9 kg)   SpO2 97%   BMI 35.26 kg/m?    ? ?Wt Readings from Last 3 Encounters:  ?04/04/22 260 lb (117.9 kg)  ?02/08/22 265 lb (120.2 kg)  ?04/22/21 248 lb 0.3 oz (112.5 kg)  ?  ? ?Gen: no distress, morbid obesity   ?Neck: No JVD, no carotid bruit healed scar right side ?Cardiac: No Rubs or Gallops, no Murmur, RRR +2radial pulses ?Respiratory: Clear to auscultation bilaterally, normal effort, normal  respiratory rate ?GI: Soft, nontender, non-distended  ?MS: No  edema;  moves all extremities ?Integument: Skin feels warm ?Neuro:  At time of evaluation, alert and oriented to person/place/time/situation  ?Psych: Normal affect, patient feels fine ? ? ?ASSESSMENT:   ? ?1. Precordial pain   ?2. Cerebrovascular accident (CVA),  unspecified mechanism (HCC)   ?3. Upper GI bleed   ?4. Tobacco abuse   ?5. Morbid obesity (HCC)   ?6. Diabetes mellitus without complication (HCC)   ? ?PLAN:   ? ? ?Chest Pain syndrome ?DDX included demand ischemia (hx of gastric bleeding ulcers, now on DAPT for stroke, MSK pain (unclear relationship to lifting) or obstructive CAD (he has  a stroke at age 15 and has prior cannabis and tobacco history, and HLD) ?- will stop plavix; CBC today  ?- will get A Cardiac CT and 100 mg metoprolol ?- continue ASA; encouraged smoking and cannabis continued cessation ?- continue high intensity statin ? ?HFmrEF ?HTN with DM ?Morbid Obesity ? Heart Failure mildly reduced Ejection Fraction  ?- NYHA class I, Stage A, euvolemic, etiology unclear ?- I am unclear if someone planned to stop his ACEi; he will call and tell us who he saw; then we will request old records ?- would be reasonable to transition to [facility] from ACEi ?- continue coreg 3.125 mg PO BID for now ?  ?Three months with APP ?Six months with me ? ?   ? ? ? ?Medication Adjustments/Labs and Tests Ordered: ?Current medicines are reviewed at length with the patient today.  Concerns regarding medicines are outlined above.  ?Orders Placed This Encounter  ?Procedures  ? CT CORONARY MORPH W/CTA COR W/SCORE W/CA W/CM &/OR WO/CM  ? Basic metabolic panel  ? CBC  ? EKG 12-Lead  ? ?Meds ordered this encounter  ?Medications  ? metoprolol tartrate (LOPRESSOR) 100 MG tablet  ?  Sig: Take 2 hours prior to Cardiac CT  ?  Dispense:  1 tablet  ?  Refill:  0  ? ? ?Patient Instructions  ?Medication Instructions:  ?Your physician has recommended you make the following change in your medication:  ?STOP: clopidogrel (Plavix) ? ?*If you need a refill on your cardiac medications before your next appointment, please call your pharmacy* ? ? ?Lab Work: ?TODAY: BMP, CBC ?If you have labs (blood work) drawn today and your tests are completely normal, you will receive your results only by: ?MyChart  Message (if you have MyChart) OR ?A paper copy in the mail ?If you have any lab test that is abnormal or we need to change your treatment, we will call you to review the results. ? ? ?Testing/Procedures: ?Your physician has

## 2022-04-04 ENCOUNTER — Encounter: Payer: Self-pay | Admitting: Internal Medicine

## 2022-04-04 ENCOUNTER — Ambulatory Visit (INDEPENDENT_AMBULATORY_CARE_PROVIDER_SITE_OTHER): Payer: BC Managed Care – PPO | Admitting: Internal Medicine

## 2022-04-04 VITALS — BP 156/102 | HR 72 | Ht 72.0 in | Wt 260.0 lb

## 2022-04-04 DIAGNOSIS — E119 Type 2 diabetes mellitus without complications: Secondary | ICD-10-CM

## 2022-04-04 DIAGNOSIS — K922 Gastrointestinal hemorrhage, unspecified: Secondary | ICD-10-CM

## 2022-04-04 DIAGNOSIS — I639 Cerebral infarction, unspecified: Secondary | ICD-10-CM | POA: Diagnosis not present

## 2022-04-04 DIAGNOSIS — R072 Precordial pain: Secondary | ICD-10-CM | POA: Insufficient documentation

## 2022-04-04 DIAGNOSIS — Z720 Tobacco use: Secondary | ICD-10-CM | POA: Diagnosis not present

## 2022-04-04 LAB — CBC
Hematocrit: 38.8 % (ref 37.5–51.0)
Hemoglobin: 12.8 g/dL — ABNORMAL LOW (ref 13.0–17.7)
MCH: 25 pg — ABNORMAL LOW (ref 26.6–33.0)
MCHC: 33 g/dL (ref 31.5–35.7)
MCV: 76 fL — ABNORMAL LOW (ref 79–97)
Platelets: 189 10*3/uL (ref 150–450)
RBC: 5.13 x10E6/uL (ref 4.14–5.80)
RDW: 12.9 % (ref 11.6–15.4)
WBC: 5.5 10*3/uL (ref 3.4–10.8)

## 2022-04-04 LAB — BASIC METABOLIC PANEL
BUN/Creatinine Ratio: 9 (ref 9–20)
BUN: 8 mg/dL (ref 6–24)
CO2: 28 mmol/L (ref 20–29)
Calcium: 9.5 mg/dL (ref 8.7–10.2)
Chloride: 101 mmol/L (ref 96–106)
Creatinine, Ser: 0.92 mg/dL (ref 0.76–1.27)
Glucose: 263 mg/dL — ABNORMAL HIGH (ref 70–99)
Potassium: 4.2 mmol/L (ref 3.5–5.2)
Sodium: 136 mmol/L (ref 134–144)
eGFR: 103 mL/min/{1.73_m2} (ref >59)

## 2022-04-04 MED ORDER — METOPROLOL TARTRATE 100 MG PO TABS: ORAL_TABLET | ORAL | 0 refills | Status: DC

## 2022-04-04 NOTE — Patient Instructions (Addendum)
Medication Instructions:  ?Your physician has recommended you make the following change in your medication:  ?STOP: clopidogrel (Plavix) ? ?*If you need a refill on your cardiac medications before your next appointment, please call your pharmacy* ? ? ?Lab Work: ?TODAY: BMP, CBC ?If you have labs (blood work) drawn today and your tests are completely normal, you will receive your results only by: ?MyChart Message (if you have MyChart) OR ?A paper copy in the mail ?If you have any lab test that is abnormal or we need to change your treatment, we will call you to review the results. ? ? ?Testing/Procedures: ?Your physician has requested that you have cardiac CT. Cardiac computed tomography (CT) is a painless test that uses an x-ray machine to take clear, detailed pictures of your heart. For further information please visit [email or web address] Please follow instruction sheet as given. ?  ? ? ?Follow-Up: ?At [facility], you and your health needs are our priority.  As part of our continuing mission to provide you with exceptional heart care, we have created designated Provider Care Teams.  These Care Teams include your primary Cardiologist (physician) and Advanced Practice Providers (APPs -  Physician Assistants and Nurse Practitioners) who all work together to provide you with the care you need, when you need it. ? ? ?Your next appointment:   ?3 month(s) ? ?The format for your next appointment:   ?In Person ? ?Provider:   ?Ronie Spies, PA-C, Jacolyn Reedy, PA-C, or Eligha Bridegroom, NP     Then, Riley Lam, MD will plan to see you again in 6 month(s).  ? ? ?Other Instructions ? ? ?Your cardiac CT will be scheduled at the below location:  ? ?[facility] ?[address] ?[place], [place] [address] ?[phone] ? ? ?At [facility], please arrive at the [facility] and Children's Entrance (Entrance C2) of [facility] 30 minutes prior to test start time. ?You can use the FREE valet  parking offered at entrance C (encouraged to control the heart rate for the test)  ?Proceed to the [facility] Radiology Department (first floor) to check-in and test prep. ? ?All radiology patients and guests should use entrance C2 at [facility], accessed from [facility], even though the hospital's physical address listed is [address]. ? ? ? ?Please follow these instructions carefully (unless otherwise directed): ? ?Hold all erectile dysfunction medications at least 3 days (72 hrs) prior to test. ? ?On the Night Before the Test: ?Be sure to Drink plenty of water. ?Do not consume any caffeinated/decaffeinated beverages or chocolate 12 hours prior to your test. ?Do not take any antihistamines 12 hours prior to your test. ? ? ?On the Day of the Test: ?Drink plenty of water until 1 hour prior to the test. ?Do not eat any food 4 hours prior to the test. ?You may take your regular medications prior to the test.  ?Take metoprolol (Lopressor) 100 mg two hours prior to test. ? ? ?     ?After the Test: ?Drink plenty of water. ?After receiving IV contrast, you may experience a mild flushed feeling. This is normal. ?On occasion, you may experience a mild rash up to 24 hours after the test. This is not dangerous. If this occurs, you can take Benadryl 25 mg and increase your fluid intake. ?If you experience trouble breathing, this can be serious. If it is severe call 911 IMMEDIATELY. If it is mild, please call our office. ?If you  take any of these medications: Glipizide/Metformin, Avandament, Glucavance, please do not take 48 hours after completing test unless otherwise instructed. ? ?We will call to schedule your test 2-4 weeks out understanding that some insurance companies will need an authorization prior to the service being performed.  ? ?For non-scheduling related questions, please contact the cardiac imaging nurse navigator should you have any questions/concerns: ?Rockwell Alexandria, Cardiac  Imaging Nurse Navigator ?Larey Brick, Cardiac Imaging Nurse Navigator ?[facility] ?Direct Office Dial: [phone]  ? ?For scheduling needs, including cancellations and rescheduling, please call [place], [phone].  ? ?Important Information About Sugar ? ? ? ? ?  ?

## 2022-04-14 ENCOUNTER — Telehealth (HOSPITAL_COMMUNITY): Payer: Self-pay | Admitting: Emergency Medicine

## 2022-04-14 NOTE — Telephone Encounter (Signed)
Reaching out to patient to offer assistance regarding upcoming cardiac imaging study; pt verbalizes understanding of appt date/time, parking situation and where to check in, pre-test NPO status and medications ordered, and verified current allergies; name and call back number provided for further questions should they arise ?Kylen Schliep RN Navigator Cardiac Imaging ?[facility] ?[phone] office ?[phone] cell ? ? ?100mg metoprolol  ?Arrival 730 ?Denies iv issues ?

## 2022-04-17 ENCOUNTER — Telehealth: Payer: Self-pay | Admitting: Internal Medicine

## 2022-04-17 ENCOUNTER — Encounter (HOSPITAL_COMMUNITY): Payer: Self-pay | Admitting: Emergency Medicine

## 2022-04-17 ENCOUNTER — Ambulatory Visit (HOSPITAL_COMMUNITY)
Admission: RE | Admit: 2022-04-17 | Discharge: 2022-04-17 | Disposition: A | Discharge status: Home / Self Care | Payer: BC Managed Care – PPO | Source: Ambulatory Visit | Attending: Internal Medicine | Admitting: Internal Medicine

## 2022-04-17 DIAGNOSIS — R072 Precordial pain: Secondary | ICD-10-CM

## 2022-04-17 IMAGING — CT CT HEART MORP W/ CTA COR W/ SCORE W/ CA W/CM &/OR W/O CM
4 of 7 series · 8 of 20 positions shown, 9 images · IV contrast (APPLIED)
Comparison: None.
COMPARISON: None.

Addendum:
EXAM:
OVER-READ INTERPRETATION  CT CHEST

The following report is an over-read performed by radiologist Dr.
TIGER [REDACTED] on [DATE]. This
over-read does not include interpretation of cardiac or coronary
anatomy or pathology. The coronary calcium score/coronary CTA
interpretation by the cardiologist is attached.
CLINICAL DATA: Chest pain
Cardiac CTA
MEDICATIONS:
Sub lingual nitro. 4mg x 2
TECHNIQUE: The patient was scanned on a Siemens [REDACTED]ice scanner. Gantry
rotation speed was 250 msecs. Collimation was 0.6 mm. A 100 kV
prospective scan was triggered in the ascending thoracic aorta at
35-75% of the R-R interval. Average HR during the scan was 60 bpm.
The 3D data set was interpreted on a dedicated work station using
MPR, MIP and VRT modes. A total of 80cc of contrast was used.

[Series 6: ts syst sharp · axial · 0.39mm/px · z∈[+1302,+1350]mm · 2 of 361 slices shown]
[im 121/361  lung]
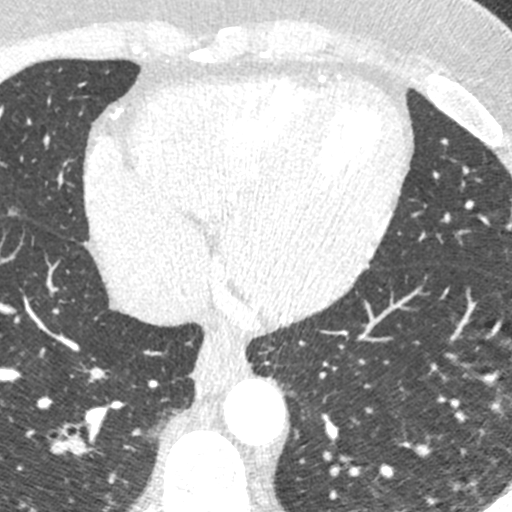
[im 241/361  lung]
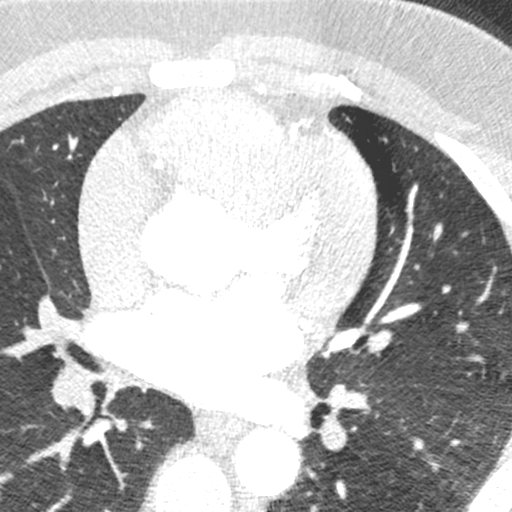

[Series 7: best syst · axial · 0.39mm/px · z∈[+1302,+1350]mm · 2 of 361 slices shown, 3 images]
[im 121/361  vessel]
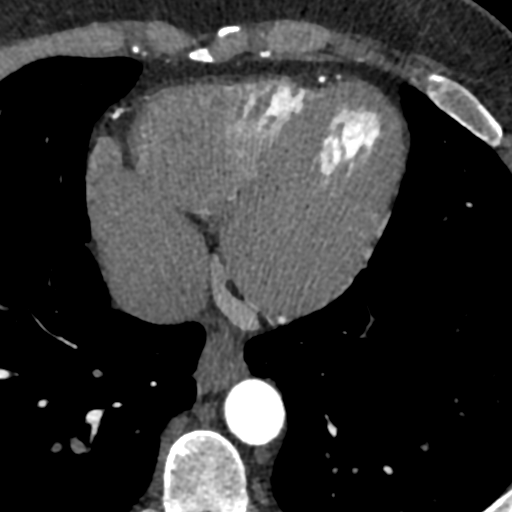
[im 121/361  lung]
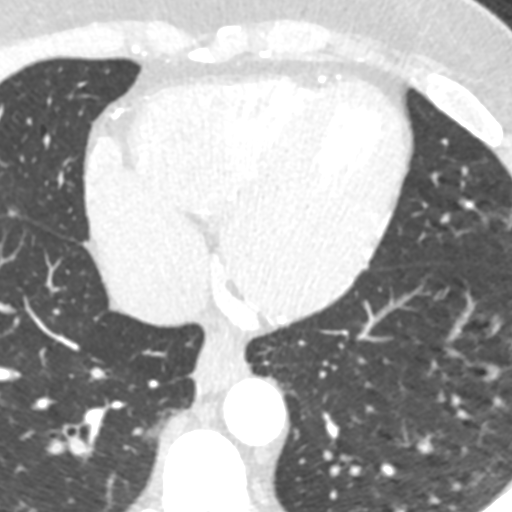
[im 241/361  vessel]
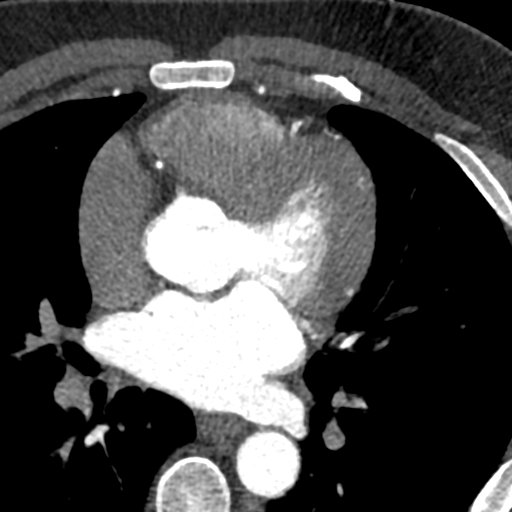

[Series 8: ts diast sharp · axial · 0.39mm/px · z∈[+1302,+1350]mm · 2 of 361 slices shown]
[im 121/361  lung]
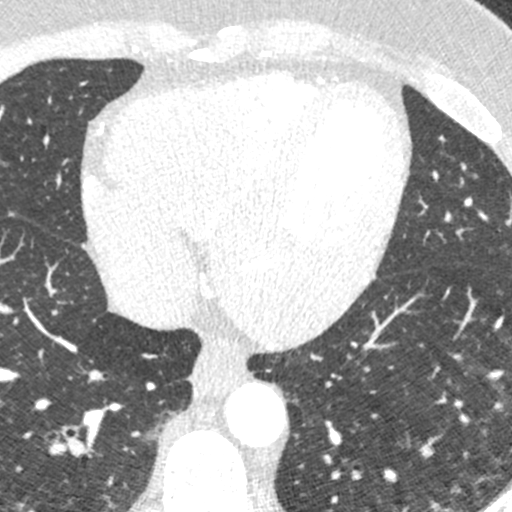
[im 241/361  lung]
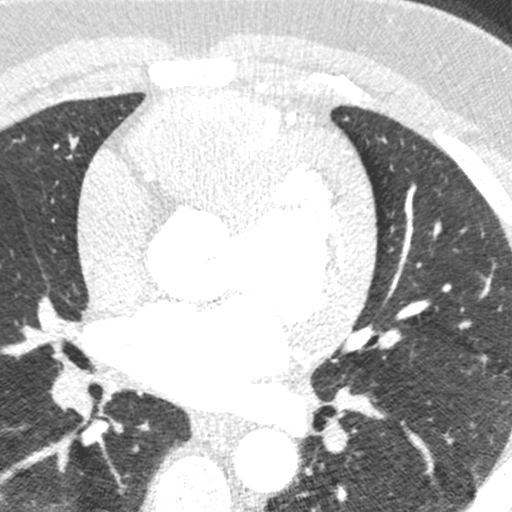

[Series 9: best diast · axial · 0.39mm/px · z∈[+1302,+1350]mm · 2 of 361 slices shown]
[im 121/361  vessel]
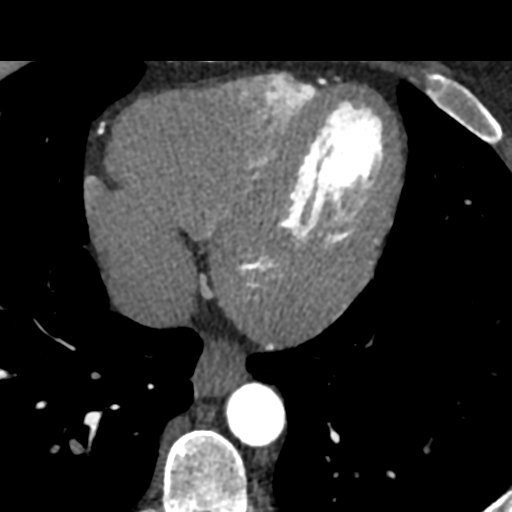
[im 241/361  vessel]
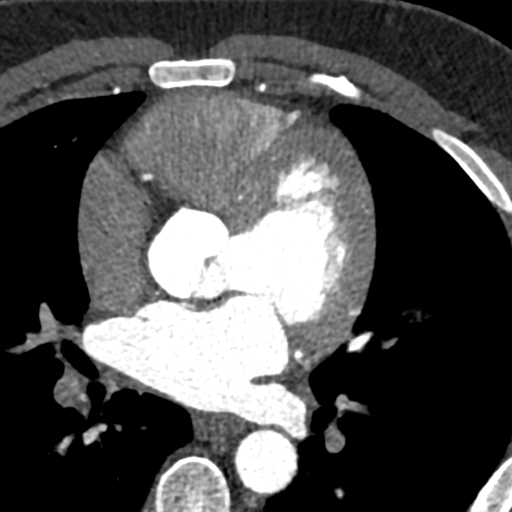

[8 of 20 positions shown; findings below may reference images not displayed]

FINDINGS: Vascular: Normal heart size. No pericardial effusion. Normal caliber
aorta with atherosclerotic disease.

Mediastinum/Nodes: Esophagus is unremarkable. No enlarged in the
chest.

Lungs/Pleura: Lungs are clear. No pleural effusion or pneumothorax.

Upper Abdomen: No acute abnormality.

Musculoskeletal: No chest wall mass or suspicious bone lesions
identified.
IMPRESSION: No acute extracardiac abnormality.
FINDINGS: Non-cardiac: See separate report from [REDACTED].

Pulmonary veins drain normally to the left atrium. No LA appendage
thrombus.

Calcium Score: 0 Agatston units.

Coronary Arteries: Co-dominant with no anomalies

LM: No plaque or stenosis.

LAD system: No plaque or stenosis.

Circumflex system: Relatively large co-dominant vessel. No plaque or
stenosis.

RCA system: Relatively small, co-dominant right system. No plaque or
stenosis.
IMPRESSION: 1. Coronary artery calcium score 0 Agatston units. This suggests low
risk for future cardiac events.

2.  No significant coronary disease noted on CTA.

TIGER

*** End of Addendum ***
EXAM:
OVER-READ INTERPRETATION  CT CHEST

The following report is an over-read performed by radiologist Dr.
TIGER [REDACTED] on [DATE]. This
over-read does not include interpretation of cardiac or coronary
anatomy or pathology. The coronary calcium score/coronary CTA
interpretation by the cardiologist is attached.
FINDINGS: Vascular: Normal heart size. No pericardial effusion. Normal caliber
aorta with atherosclerotic disease.

Mediastinum/Nodes: Esophagus is unremarkable. No enlarged in the
chest.

Lungs/Pleura: Lungs are clear. No pleural effusion or pneumothorax.

Upper Abdomen: No acute abnormality.

Musculoskeletal: No chest wall mass or suspicious bone lesions
identified.
IMPRESSION: No acute extracardiac abnormality.

## 2022-04-17 MED ORDER — IOHEXOL 350 MG/ML SOLN
100.0000 mL | Freq: Once | INTRAVENOUS | Status: AC | PRN
Start: 2022-04-17 — End: 2022-04-17
  Administered 2022-04-17: 100 mL via INTRAVENOUS

## 2022-04-17 MED ORDER — NITROGLYCERIN 0.4 MG SL SUBL
SUBLINGUAL_TABLET | SUBLINGUAL | Status: AC
  Filled 2022-04-17: qty 2

## 2022-04-17 MED ORDER — NITROGLYCERIN 0.4 MG SL SUBL
0.8000 mg | SUBLINGUAL_TABLET | Freq: Once | SUBLINGUAL | Status: AC
  Administered 2022-04-17: 0.8 mg via SUBLINGUAL

## 2022-04-17 NOTE — Telephone Encounter (Signed)
Need a note for work had the CT done this morning.   [phone] [id number] ?

## 2022-04-17 NOTE — Telephone Encounter (Signed)
Letter written by Marchia Bond.   ?

## 2022-06-12 ENCOUNTER — Other Ambulatory Visit: Payer: Self-pay

## 2022-06-12 ENCOUNTER — Emergency Department (HOSPITAL_COMMUNITY)
Admission: EM | Admit: 2022-06-12 | Discharge: 2022-06-12 | Disposition: A | Discharge status: Home / Self Care | Payer: BC Managed Care – PPO | Attending: Emergency Medicine | Admitting: Emergency Medicine

## 2022-06-12 ENCOUNTER — Emergency Department (HOSPITAL_COMMUNITY): Payer: BC Managed Care – PPO

## 2022-06-12 DIAGNOSIS — Z794 Long term (current) use of insulin: Secondary | ICD-10-CM | POA: Diagnosis not present

## 2022-06-12 DIAGNOSIS — I10 Essential (primary) hypertension: Secondary | ICD-10-CM | POA: Insufficient documentation

## 2022-06-12 DIAGNOSIS — Z720 Tobacco use: Secondary | ICD-10-CM | POA: Insufficient documentation

## 2022-06-12 DIAGNOSIS — Z79899 Other long term (current) drug therapy: Secondary | ICD-10-CM | POA: Insufficient documentation

## 2022-06-12 DIAGNOSIS — E119 Type 2 diabetes mellitus without complications: Secondary | ICD-10-CM | POA: Diagnosis not present

## 2022-06-12 DIAGNOSIS — Z7982 Long term (current) use of aspirin: Secondary | ICD-10-CM | POA: Diagnosis not present

## 2022-06-12 DIAGNOSIS — F4010 Social phobia, unspecified: Secondary | ICD-10-CM | POA: Diagnosis not present

## 2022-06-12 DIAGNOSIS — R0789 Other chest pain: Secondary | ICD-10-CM | POA: Insufficient documentation

## 2022-06-12 DIAGNOSIS — Z7984 Long term (current) use of oral hypoglycemic drugs: Secondary | ICD-10-CM | POA: Diagnosis not present

## 2022-06-12 DIAGNOSIS — F12280 Cannabis dependence with cannabis-induced anxiety disorder: Secondary | ICD-10-CM | POA: Insufficient documentation

## 2022-06-12 DIAGNOSIS — D649 Anemia, unspecified: Secondary | ICD-10-CM | POA: Insufficient documentation

## 2022-06-12 DIAGNOSIS — R079 Chest pain, unspecified: Secondary | ICD-10-CM | POA: Diagnosis present

## 2022-06-12 DIAGNOSIS — F1220 Cannabis dependence, uncomplicated: Secondary | ICD-10-CM

## 2022-06-12 LAB — CBC
HCT: 35.4 % — ABNORMAL LOW (ref 39.0–52.0)
Hemoglobin: 10.5 g/dL — ABNORMAL LOW (ref 13.0–17.0)
MCH: 25.8 pg — ABNORMAL LOW (ref 26.0–34.0)
MCHC: 29.7 g/dL — ABNORMAL LOW (ref 30.0–36.0)
MCV: 87 fL (ref 80.0–100.0)
Platelets: 242 10*3/uL (ref 150–400)
RBC: 4.07 MIL/uL — ABNORMAL LOW (ref 4.22–5.81)
RDW: 13.9 % (ref 11.5–15.5)
WBC: 5.3 10*3/uL (ref 4.0–10.5)
nRBC: 0 % (ref 0.0–0.2)

## 2022-06-12 LAB — BASIC METABOLIC PANEL
Anion gap: 7 (ref 5–15)
BUN: 6 mg/dL (ref 6–20)
CO2: 26 mmol/L (ref 22–32)
Calcium: 9 mg/dL (ref 8.9–10.3)
Chloride: 108 mmol/L (ref 98–111)
Creatinine, Ser: 1.07 mg/dL (ref 0.61–1.24)
GFR, Estimated: >60 mL/min (ref >60)
Glucose, Bld: 127 mg/dL — ABNORMAL HIGH (ref 70–99)
Potassium: 3.6 mmol/L (ref 3.5–5.1)
Sodium: 141 mmol/L (ref 135–145)

## 2022-06-12 LAB — TROPONIN I (HIGH SENSITIVITY)
Troponin I (High Sensitivity): 5 ng/L (ref <18)
Troponin I (High Sensitivity): 5 ng/L (ref <18)

## 2022-06-12 MED ORDER — NAPROXEN 375 MG PO TABS: 375.0000 mg | ORAL_TABLET | Freq: Two times a day (BID) | ORAL | 0 refills | Status: AC

## 2022-06-12 NOTE — ED Triage Notes (Signed)
Pt with L sided chest tightness, constant since onset yesterday. Denies sob, n/v, or other associated symptoms. Took a [employer] aspirin PTA with small relief.

## 2022-06-27 NOTE — Progress Notes (Deleted)
Cardiology Office Note    Date:  06/27/2022   ID:  Roger Pennington, DOB 1976-08-22, MRN [id number]   PCP:  [facility]  Cardiologist:  None *** Advanced Practice Provider:  No care team member to display Electrophysiologist:  None   [id number]}   No chief complaint on file.   History of Present Illness:  Roger Pennington is a 46 y.o. 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 2023: LVEF 45%, No AF on monitor.  Patient saw Dr. Izora Ribas 04/14/22 with chest pain. He stopped plavic and checked CBC and Coronary CTA calcium score 0, no CAD.   In ED 06/12/22 for chest pain felt to be M-S  Past Medical History:  Diagnosis Date   Diabetes mellitus without complication (HCC)    Hypertension    Tobacco abuse     Past Surgical History:  Procedure Laterality Date   ESOPHAGOGASTRODUODENOSCOPY (EGD) WITH PROPOFOL N/A 02/09/2016   Procedure: ESOPHAGOGASTRODUODENOSCOPY (EGD) WITH PROPOFOL;  Surgeon: Christena Deem, MD;  Location: [facility];  Service: Endoscopy;  Laterality: N/A;   INCISION AND DRAINAGE ABSCESS Right 05/25/2020   Procedure: INCISION AND DRAINAGE ABSCESS;  Surgeon: Leafy Ro, MD;  Location: [facility] OR[facility];  Service: General;  Laterality: Right;    Current Medications: No outpatient medications have been marked as taking for the 07/11/22 encounter (Appointment) with Dyann Kief, PA-C.     Allergies:   Patient has no known allergies.   Social History   Socioeconomic History   Marital status: Single    Spouse name: Not on file   Number of children: Not on file   Years of education: Not on file   Highest education level: Not on file  Occupational History   Not on file  Tobacco Use   Smoking status: Former    Types: Cigarettes   Smokeless tobacco: Never  Substance and  Sexual Activity   Alcohol use: Yes    Alcohol/week: 0.0 standard drinks of alcohol    Comment: occ   Drug use: Yes    Types: Marijuana    Comment: occ   Sexual activity: Not on file  Other Topics Concern   Not on file  Social History Narrative   Not on file   Social Determinants of Health   Financial Resource Strain: Not on file  Food Insecurity: Not on file  Transportation Needs: Not on file  Physical Activity: Not on file  Stress: Not on file  Social Connections: Not on file     Family History:  The patient's ***family history includes Hypertension in his brother and father.   ROS:   Please see the history of present illness.    ROS All other systems reviewed and are negative.   PHYSICAL EXAM:   VS:  There were no vitals taken for this visit.  Physical Exam  GEN: Well nourished, well developed, in no acute distress  HEENT: normal  Neck: no JVD, carotid bruits, or masses Cardiac:RRR; no murmurs, rubs, or gallops  Respiratory:  clear to auscultation bilaterally, normal work of breathing GI: soft, nontender, nondistended, + BS Ext: without cyanosis, clubbing, or edema, Good distal pulses bilaterally MS: no deformity or atrophy  Skin: warm and dry, no rash Neuro:  Alert and Oriented x 3, Strength and sensation are intact Psych: euthymic mood,  full affect  Wt Readings from Last 3 Encounters:  04/04/22 260 lb (117.9 kg)  02/08/22 265 lb (120.2 kg)  04/22/21 248 lb 0.3 oz (112.5 kg)      Studies/Labs Reviewed:   EKG:  EKG is*** ordered today.  The ekg ordered today demonstrates ***  Recent Labs: 02/08/2022: ALT 27 06/12/2022: BUN 6; Creatinine, Ser 1.07; Hemoglobin 10.5; Platelets 242; Potassium 3.6; Sodium 141   Lipid Panel    Component Value Date/Time   CHOL 169 02/09/2022 0554   TRIG 135 02/09/2022 0554   HDL 32 (L) 02/09/2022 0554   CHOLHDL 5.3 02/09/2022 0554   VLDL 27 02/09/2022 0554   LDLCALC 110 (H) 02/09/2022 0554    Additional studies/ records  that were reviewed today include:  Coronary CTA 04/2022 IMPRESSION: 1. Coronary artery calcium score 0 Agatston units. This suggests low risk for future cardiac events.   2.  No significant coronary disease noted on CTA.   Roger Pennington     Electronically Signed   By: Marca Ancona M.D.   On: 04/17/2022 13:23     Risk Assessment/Calculations:   {Does this patient have ATRIAL FIBRILLATION?[phone]}     ASSESSMENT:    No diagnosis found.   PLAN:  In order of problems listed above:  Chest pain with no CAD on Coronary CTA  NICM EF 45%  HTN  DM  Morbid obesity  Tobacco abuse  Shared Decision Making/Informed Consent   {Are you ordering a CV Procedure (e.g. stress test, cath, DCCV, TEE, etc)?   Press F2        :[id number]}    Medication Adjustments/Labs and Tests Ordered: Current medicines are reviewed at length with the patient today.  Concerns regarding medicines are outlined above.  Medication changes, Labs and Tests ordered today are listed in the Patient Instructions below. There are no Patient Instructions on file for this visit.   Elson Clan, PA-C  06/27/2022 3:18 PM    [facility] [address] [place], [place], [place]  [address] Phone: [phone]; Fax: [phone]

## 2022-07-11 ENCOUNTER — Ambulatory Visit: Payer: BC Managed Care – PPO | Admitting: Physician Assistant

## 2022-07-11 DIAGNOSIS — R079 Chest pain, unspecified: Secondary | ICD-10-CM

## 2022-07-11 DIAGNOSIS — I428 Other cardiomyopathies: Secondary | ICD-10-CM

## 2022-07-11 DIAGNOSIS — I10 Essential (primary) hypertension: Secondary | ICD-10-CM

## 2022-08-09 ENCOUNTER — Encounter: Payer: Self-pay | Admitting: Medical Oncology

## 2022-08-09 ENCOUNTER — Emergency Department: Payer: BC Managed Care – PPO

## 2022-08-09 ENCOUNTER — Emergency Department
Admission: EM | Admit: 2022-08-09 | Discharge: 2022-08-09 | Disposition: A | Discharge status: Home / Self Care | Payer: BC Managed Care – PPO | Attending: Emergency Medicine | Admitting: Emergency Medicine

## 2022-08-09 DIAGNOSIS — R0789 Other chest pain: Secondary | ICD-10-CM | POA: Diagnosis not present

## 2022-08-09 DIAGNOSIS — G43809 Other migraine, not intractable, without status migrainosus: Secondary | ICD-10-CM | POA: Insufficient documentation

## 2022-08-09 DIAGNOSIS — E119 Type 2 diabetes mellitus without complications: Secondary | ICD-10-CM | POA: Diagnosis not present

## 2022-08-09 DIAGNOSIS — R519 Headache, unspecified: Secondary | ICD-10-CM | POA: Diagnosis present

## 2022-08-09 DIAGNOSIS — R202 Paresthesia of skin: Secondary | ICD-10-CM | POA: Diagnosis not present

## 2022-08-09 DIAGNOSIS — I10 Essential (primary) hypertension: Secondary | ICD-10-CM | POA: Diagnosis not present

## 2022-08-09 LAB — BASIC METABOLIC PANEL
Anion gap: 8 (ref 5–15)
BUN: 16 mg/dL (ref 6–20)
CO2: 23 mmol/L (ref 22–32)
Calcium: 9.3 mg/dL (ref 8.9–10.3)
Chloride: 108 mmol/L (ref 98–111)
Creatinine, Ser: 1 mg/dL (ref 0.61–1.24)
GFR, Estimated: >60 mL/min (ref >60)
Glucose, Bld: 158 mg/dL — ABNORMAL HIGH (ref 70–99)
Potassium: 3.9 mmol/L (ref 3.5–5.1)
Sodium: 139 mmol/L (ref 135–145)

## 2022-08-09 LAB — CBC
HCT: 41 % (ref 39.0–52.0)
Hemoglobin: 12.7 g/dL — ABNORMAL LOW (ref 13.0–17.0)
MCH: 24.9 pg — ABNORMAL LOW (ref 26.0–34.0)
MCHC: 31 g/dL (ref 30.0–36.0)
MCV: 80.4 fL (ref 80.0–100.0)
Platelets: 200 10*3/uL (ref 150–400)
RBC: 5.1 MIL/uL (ref 4.22–5.81)
RDW: 12.1 % (ref 11.5–15.5)
WBC: 6.1 10*3/uL (ref 4.0–10.5)
nRBC: 0 % (ref 0.0–0.2)

## 2022-08-09 LAB — TROPONIN I (HIGH SENSITIVITY)
Troponin I (High Sensitivity): 3 ng/L (ref <18)
Troponin I (High Sensitivity): 3 ng/L (ref <18)

## 2022-08-09 MED ORDER — METOCLOPRAMIDE HCL 5 MG/ML IJ SOLN
10.0000 mg | Freq: Once | INTRAMUSCULAR | Status: AC
  Administered 2022-08-09: 10 mg via INTRAMUSCULAR
  Filled 2022-08-09: qty 2

## 2022-08-09 NOTE — ED Triage Notes (Signed)
Pt reports that he woke up with HA this am and feeling tight in his right arm. Pt was at work and they checked his BP, it was elevated so was instructed to come here.

## 2022-08-09 NOTE — ED Notes (Signed)
Pt verbalized understanding of discharge instructions. Opportunity for questions provided.  

## 2022-08-09 NOTE — Discharge Instructions (Signed)
Continue taking your blood pressure medication as prescribed.  Follow up with your primary care within the next week.  Return to the ER for new, worsening or persistent severe headache, arm or leg numbness or weakness, difficulty speaking or walking, vision changes, or any other new or worsening symptoms that concern you.

## 2022-08-09 NOTE — ED Provider Notes (Signed)
[facility] Provider Note    Event Date/Time   First MD Initiated Contact with Patient 08/09/22 1016     (approximate)   History   Headache   HPI  Roger Pennington is a 46 y.o. male with history of hypertension, diabetes, prior stroke who presents with headache and elevated blood pressure.  The patient states that he started having headache a few hours ago mainly in the area of the temples bilaterally.  It is pounding and associated with some photophobia.  He states he had a migraine once which was similar but does not normally get headaches like this.  He denies any nausea or vomiting.  He has no vision changes.  He states that subsequently he started having some pain in his right arm as well as some tingling in the right hand.  He denies any weakness.  He reports mild tightness in his chest but denies any active chest pain or difficulty breathing.  He states when he had prior stroke he had numbness and weakness in the right arm that was significantly worse although did have a headache at that time as well.  He states he took his blood pressure medications this morning.      Physical Exam   Triage Vital Signs: ED Triage Vitals  Enc Vitals Group     BP 08/09/22 0924 (!) 161/100     Pulse Rate 08/09/22 0924 74     Resp 08/09/22 0924 17     Temp 08/09/22 0924 98.3 F (36.8 C)     Temp Source 08/09/22 0924 Oral     SpO2 08/09/22 0924 97 %     Weight 08/09/22 0927 252 lb (114.3 kg)     Height 08/09/22 0927 6' (1.829 m)     Head Circumference --      Peak Flow --      Pain Score 08/09/22 0927 10     Pain Loc --      Pain Edu? --      Excl. in GC? --     Most recent vital signs: Vitals:   08/09/22 1251 08/09/22 1501  BP: (!) 155/100 (!) 145/100  Pulse: 65 71  Resp: 16 16  Temp:  97.7 F (36.5 C)  SpO2: 98% 96%     General: Alert and oriented, comfortable appearing. CV:  Good peripheral perfusion.  Resp:  Normal effort.  Abd:  No  distention.  Other:  EOMI.  PERRLA.  No facial droop.  5/5 motor strength to all extremities.  Paresthesias to right hand with no focal numbness.  No pronator drift.  Normal coordination.   ED Results / Procedures / Treatments   Labs (all labs ordered are listed, but only abnormal results are displayed) Labs Reviewed  BASIC METABOLIC PANEL - Abnormal; Notable for the following components:      Result Value   Glucose, Bld 158 (*)    All other components within normal limits  CBC - Abnormal; Notable for the following components:   Hemoglobin 12.7 (*)    MCH 24.9 (*)    All other components within normal limits  TROPONIN I (HIGH SENSITIVITY)  TROPONIN I (HIGH SENSITIVITY)     EKG  ED ECG REPORT I, Dionne Bucy, the attending physician, personally viewed and interpreted this ECG.  Date: 08/09/2022 EKG Time: 09 27 Rate: 74 Rhythm: normal sinus rhythm QRS Axis: normal Intervals: Incomplete RBBB ST/T Wave abnormalities: normal Narrative Interpretation: no evidence of acute ischemia  RADIOLOGY  Chest x-ray: I independently viewed and interpreted the images; there is no focal consolidation or edema  CT head: I independently viewed and interpreted the images; there is no ICH.  Radiology report indicates no acute findings.  PROCEDURES:  Critical Care performed: No  Procedures   MEDICATIONS ORDERED IN ED: Medications  metoCLOPramide (REGLAN) injection 10 mg (10 mg Intramuscular Given 08/09/22 1110)     IMPRESSION / MDM / ASSESSMENT AND PLAN / ED COURSE  I reviewed the triage vital signs and the nursing notes.  46 year old male with PMH as noted above presents with bilateral temporal headache as well as elevated blood pressure with systolic of 180 prior to arrival and some tingling/paresthesias in the right arm and hand.  I reviewed past medical records.  Per the hospitalist discharge summary from 2/23, the patient was admitted at that time with right arm  numbness and weakness and found to have acute versus subacute left parietal infarct on MRI.  Differential diagnosis includes, but is not limited to, migraine headache, tension headache, symptoms related to hypertension, less likely acute CVA.  I do not suspect intracranial hemorrhage.  The patient has no meningeal signs or fever.  He has no chest pain and my suspicion for ACS is low.  Patient's presentation is most consistent with acute presentation with potential threat to life or bodily function.  Given his elevated stroke risk we will obtain a CT head, as well as basic labs, cardiac enzymes x2, and we will give Reglan for symptomatic treatment.  ----------------------------------------- 2:53 PM on 08/09/2022 -----------------------------------------  CT head was negative for acute findings.  Lab work-up is reassuring.  Troponins are negative x2.  Electrolytes are normal.  On my initial reassessment after the Reglan, the patient reported significant improvement.  He still has some headache and mild tingling but felt much better.  Although my suspicion for acute stroke was low, based on shared decision making with the patient since he still had some symptoms I ordered an MRI.  The pain has been waiting for the MRI for the last couple of hours.  On repeat assessment short time ago, the patient is now completely asymptomatic with no headache or paresthesias.  Repeat neuro exam again reveals no weakness or other focal deficits.  I consulted Dr. Otelia Limes from neurology and discussed the case with him.  We reviewed the patient's prior records and imaging.  Per Dr. Otelia Limes, the apparent stroke diagnosed in February is very small and somewhat questionable on imaging.  He advised that if the patient is asymptomatic at this time he does not require further ED evaluation.  I discussed this with the patient and he agrees.  I canceled the MRI.  The patient is stable for discharge home at this time.  He will  continue his usual blood pressure medications.  I gave him very thorough return precautions and he expressed understanding.     FINAL CLINICAL IMPRESSION(S) / ED DIAGNOSES   Final diagnoses:  Other migraine without status migrainosus, not intractable  Paresthesia of right arm     Rx / DC Orders   ED Discharge Orders     None        Note:  This document was prepared using Dragon voice recognition software and may include unintentional dictation errors.    Dionne Bucy, MD 08/09/22 [phone]

## 2022-10-02 NOTE — Progress Notes (Deleted)
Cardiology Office Note:    Date:  10/02/2022   ID:  Roger Pennington, DOB 08-Aug-1976, MRN [id number]  PCP:  [facility] Providers Cardiologist:  None     Referring MD: [facility]   CC: HF f/u   History of Present Illness:    Roger Pennington is a 46 y.o. 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 2023: LVEF 45%, No AF on monitor.  Patient notes that he is doing ***.   Since last visit notes *** . There are no*** interval hospital/ED visit.    No chest pain or pressure ***.  No SOB/DOE*** and no PND/Orthopnea***.  No weight gain or leg swelling***.  No palpitations or syncope ***.  Ambulatory blood pressure ***.  Past Medical History:  Diagnosis Date   Diabetes mellitus without complication (HCC)    Hypertension    Tobacco abuse     Past Surgical History:  Procedure Laterality Date   ESOPHAGOGASTRODUODENOSCOPY (EGD) WITH PROPOFOL N/A 02/09/2016   Procedure: ESOPHAGOGASTRODUODENOSCOPY (EGD) WITH PROPOFOL;  Surgeon: Christena Deem, MD;  Location: [facility] ENDOSCOPY;  Service: Endoscopy;  Laterality: N/A;   INCISION AND DRAINAGE ABSCESS Right 05/25/2020   Procedure: INCISION AND DRAINAGE ABSCESS;  Surgeon: Leafy Ro, MD;  Location: [facility] ORS;  Service: General;  Laterality: Right;    Current Medications: No outpatient medications have been marked as taking for the 10/04/22 encounter (Appointment) with Christell Constant, MD.     Allergies:   Patient has no known allergies.   Social History   Socioeconomic History   Marital status: Single    Spouse name: Not on file   Number of children: Not on file   Years of education: Not on file   Highest education level: Not on file  Occupational History   Not on file  Tobacco Use   Smoking status: Former    Types: Cigarettes   Smokeless  tobacco: Never  Substance and Sexual Activity   Alcohol use: Yes    Alcohol/week: 0.0 standard drinks of alcohol    Comment: occ   Drug use: Yes    Types: Marijuana    Comment: occ   Sexual activity: Not on file  Other Topics Concern   Not on file  Social History Narrative   Not on file   Social Determinants of Health   Financial Resource Strain: Not on file  Food Insecurity: Not on file  Transportation Needs: Not on file  Physical Activity: Not on file  Stress: Not on file  Social Connections: Not on file     Family History: The patient's family history includes Hypertension in his brother and father.  ROS:   Please see the history of present illness.     All other systems reviewed and are negative.  EKGs/Labs/Other Studies Reviewed:    The following studies were reviewed today:  EKG:     ECHO COMPLETE WO IMAGING ENHANCING AGENT 02/08/2022  Narrative ECHOCARDIOGRAM REPORT    Patient Name:   Roger Pennington Date of Exam: 02/08/2022 Medical Rec #:  [id number]          Height:       72.0 in Accession #:    [id number]         Weight:       265.0 lb Date of Birth:  1976/09/11           BSA:          2.401 m Patient Age:    46 years           BP:           177/99 mmHg Patient Gender: M                  HR:           65 bpm. Exam Location:  Inpatient  Procedure: 2D Echo  Indications:    Stroke  History:        Patient has prior history of Echocardiogram examinations, most recent 05/25/2020. Risk Factors:Diabetes and Hypertension.  Sonographer:    Devonne Doughty Referring Phys: [id number] Emeline General  IMPRESSIONS   1. Left ventricular ejection fraction, by estimation, is 45 to 50%. The left ventricle has mildly decreased function. The left ventricle demonstrates global hypokinesis. Left ventricular diastolic parameters are consistent with Grade I diastolic dysfunction (impaired relaxation). 2. Right ventricular systolic function is normal. The right ventricular  size is mildly enlarged. There is normal pulmonary artery systolic pressure. 3. The mitral valve is normal in structure. Trivial mitral valve regurgitation. No evidence of mitral stenosis. 4. The aortic valve is normal in structure. Aortic valve regurgitation is not visualized. No aortic stenosis is present. 5. The inferior vena cava is normal in size with greater than 50% respiratory variability, suggesting right atrial pressure of 3 mmHg.  Comparison(s): No significant change from prior study. Prior images reviewed side by side.  FINDINGS Left Ventricle: Left ventricular ejection fraction, by estimation, is 45 to 50%. The left ventricle has mildly decreased function. The left ventricle demonstrates global hypokinesis. The left ventricular internal cavity size was normal in size. There is no left ventricular hypertrophy. Left ventricular diastolic parameters are consistent with Grade I diastolic dysfunction (impaired relaxation).  Right Ventricle: The right ventricular size is mildly enlarged. No increase in right ventricular wall thickness. Right ventricular systolic function is normal. There is normal pulmonary artery systolic pressure. The tricuspid regurgitant velocity is 2.11 m/s, and with an assumed right atrial pressure of 3 mmHg, the estimated right ventricular systolic pressure is 20.8 mmHg.  Left Atrium: Left atrial size was normal in size.  Right Atrium: Right atrial size was normal in size.  Pericardium: There is no evidence of pericardial effusion.  Mitral Valve: The mitral valve is normal in structure. Trivial mitral valve regurgitation. No evidence of mitral valve stenosis.  Tricuspid Valve: The tricuspid valve is normal in structure. Tricuspid valve regurgitation is trivial. No evidence of tricuspid stenosis.  Aortic Valve: The aortic valve is normal in structure. Aortic valve regurgitation is not visualized. No aortic stenosis is present. Aortic valve mean gradient measures  2.0 mmHg. Aortic valve peak gradient measures 5.0 mmHg. Aortic valve area, by VTI measures 3.66 cm.  Pulmonic Valve: The pulmonic valve was normal in structure. Pulmonic valve regurgitation is not visualized. No evidence of pulmonic stenosis.  Aorta: The aortic root is normal in size and structure.  Venous: The inferior vena cava is normal in size with greater than 50% respiratory variability, suggesting right atrial pressure of 3 mmHg.  IAS/Shunts: No atrial level shunt detected by color flow Doppler.   LEFT VENTRICLE PLAX 2D LVIDd:         4.96 cm   Diastology LVIDs:         3.70 cm   LV e' medial:  6.20 cm/s LV PW:         1.02 cm   LV E/e' medial:  7.2 LV IVS:        1.02 cm   LV e' lateral:   6.85 cm/s LVOT diam:     2.20 cm   LV E/e' lateral: 6.6 LV SV:         90 LV SV Index:   37 LVOT Area:     3.80 cm   RIGHT VENTRICLE RV Basal diam:  4.19 cm RV Mid diam:    3.60 cm RV S prime:     9.25 cm/s TAPSE (M-mode): 1.8 cm  LEFT ATRIUM             Index        RIGHT ATRIUM           Index LA diam:        3.80 cm 1.58 cm/m   RA Area:     21.80 cm LA Vol (A2C):   52.5 ml 21.86 ml/m  RA Volume:   68.10 ml  28.36 ml/m LA Vol (A4C):   57.8 ml 24.07 ml/m LA Biplane Vol: 55.4 ml 23.07 ml/m AORTIC VALVE AV Area (Vmax):    3.73 cm AV Area (Vmean):   3.57 cm AV Area (VTI):     3.66 cm AV Vmax:           112.00 cm/s AV Vmean:          73.600 cm/s AV VTI:            0.245 m AV Peak Grad:      5.0 mmHg AV Mean Grad:      2.0 mmHg LVOT Vmax:         110.00 cm/s LVOT Vmean:        69.200 cm/s LVOT VTI:          0.236 m LVOT/AV VTI ratio: 0.96  AORTA Ao Root diam: 3.20 cm Ao Asc diam:  3.50 cm  MITRAL VALVE               TRICUSPID VALVE MV Area (PHT): 2.22 cm    TR Peak grad:   17.8 mmHg MV Decel Time: 342 msec    TR Vmax:        211.00 cm/s MV E velocity: 44.90 cm/s MV A velocity: 58.60 cm/s  SHUNTS MV E/A ratio:  0.77        Systemic VTI:  0.24 m Systemic  Diam: 2.20 cm  Donato Schultz MD Electronically signed by Donato Schultz MD Signature Date/Time: 02/08/2022/2:06:43 PM       NON-TELEMETRY MONITORING-INTERPRETATION ONLY 03/24/2022  Narrative  Patient had a minimum heart rate of 60 bpm, maximum heart rate of 153 bpm, and average heart rate of 82 bpm.  Predominant underlying rhythm was sinus rhythm.  Isolated PACs were rare (<1.0%).  Isolated PVCs were rare (<1.0%).  No evidence of atrial fibrillation.  Triggered and diary events associated with sinus rhyhm.  No malignant arrhythmias.   CT CORONARY MORPH W/CTA COR W/SCORE W/CA W/CM &/OR WO/CM 04/17/2022  Addendum 04/17/2022  1:26 PM ADDENDUM REPORT: 04/17/2022 13:23  CLINICAL DATA:  Chest pain  EXAM: Cardiac CTA  MEDICATIONS: Sub lingual nitro. 4mg  x 2  TECHNIQUE: The patient was scanned on a Siemens 192 slice scanner. Gantry rotation speed was 250 msecs. Collimation was 0.6 mm. A 100 kV prospective scan was triggered in the ascending thoracic aorta at 35-75% of the R-R  interval. Average HR during the scan was 60 bpm. The 3D data set was interpreted on a dedicated work station using MPR, MIP and VRT modes. A total of 80cc of contrast was used.  FINDINGS: Non-cardiac: See separate report from [facility].  Pulmonary veins drain normally to the left atrium. No LA appendage thrombus.  Calcium Score: 0 Agatston units.  Coronary Arteries: Co-dominant with no anomalies  LM: No plaque or stenosis.  LAD system: No plaque or stenosis.  Circumflex system: Relatively large co-dominant vessel. No plaque or stenosis.  RCA system: Relatively small, co-dominant right system. No plaque or stenosis.  IMPRESSION: 1. Coronary artery calcium score 0 Agatston units. This suggests low risk for future cardiac events.  2.  No significant coronary disease noted on CTA.  Dalton Mclean   Electronically Signed By: Loralie Champagne M.D. On: 04/17/2022  13:23  Narrative EXAM: OVER-READ INTERPRETATION  CT CHEST  The following report is an over-read performed by radiologist Dr. Yetta Glassman of [facility], [place] on 04/17/2022. This over-read does not include interpretation of cardiac or coronary anatomy or pathology. The coronary calcium score/coronary CTA interpretation by the cardiologist is attached.  COMPARISON:  None.  FINDINGS: Vascular: Normal heart size. No pericardial effusion. Normal caliber aorta with atherosclerotic disease.  Mediastinum/Nodes: Esophagus is unremarkable. No enlarged in the chest.  Lungs/Pleura: Lungs are clear. No pleural effusion or pneumothorax.  Upper Abdomen: No acute abnormality.  Musculoskeletal: No chest wall mass or suspicious bone lesions identified.  IMPRESSION: No acute extracardiac abnormality.  Electronically Signed: By: Yetta Glassman M.D. On: 04/17/2022 09:12      Recent Labs: 02/08/2022: ALT 27 08/09/2022: BUN 16; Creatinine, Ser 1.00; Hemoglobin 12.7; Platelets 200; Potassium 3.9; Sodium 139  Recent Lipid Panel    Component Value Date/Time   CHOL 169 02/09/2022 0554   TRIG 135 02/09/2022 0554   HDL 32 (L) 02/09/2022 0554   CHOLHDL 5.3 02/09/2022 0554   VLDL 27 02/09/2022 0554   LDLCALC 110 (H) 02/09/2022 0554        Physical Exam:    VS:  There were no vitals taken for this visit.    Wt Readings from Last 3 Encounters:  08/09/22 252 lb (114.3 kg)  04/04/22 260 lb (117.9 kg)  02/08/22 265 lb (120.2 kg)    Gen: no distress, morbid obesity   Neck: No JVD, no carotid bruit healed scar right side Cardiac: No Rubs or Gallops, no Murmur, RRR +2radial pulses Respiratory: Clear to auscultation bilaterally, normal effort, normal  respiratory rate GI: Soft, nontender, non-distended  MS: No  edema;  moves all extremities Integument: Skin feels warm Neuro:  At time of evaluation, alert and oriented to person/place/time/situation  Psych: Normal affect, patient  feels fine   ASSESSMENT:    No diagnosis found.  PLAN:    HFmrEF HTN with DM Morbid Obesity  Heart Failure mildly reduced Ejection Fraction  - NYHA class I, Stage A, euvolemic, etiology unclear *** - continue coreg 3.125 mg PO BID for now   smoking and cannabis continued cessation  Six months to one year        Medication Adjustments/Labs and Tests Ordered: Current medicines are reviewed at length with the patient today.  Concerns regarding medicines are outlined above.  No orders of the defined types were placed in this encounter.  No orders of the defined types were placed in this encounter.   There are no Patient Instructions on file for this visit.   Signed, Soraya Paquette  Richardean Chimera, MD  10/02/2022 8:16 AM    [facility]

## 2022-10-04 ENCOUNTER — Ambulatory Visit: Payer: BC Managed Care – PPO | Admitting: Internal Medicine

## 2023-08-27 ENCOUNTER — Other Ambulatory Visit: Payer: Self-pay

## 2023-08-27 ENCOUNTER — Encounter: Payer: Self-pay | Admitting: Emergency Medicine

## 2023-08-27 ENCOUNTER — Emergency Department
Admission: EM | Admit: 2023-08-27 | Discharge: 2023-08-27 | Disposition: A | Discharge status: Home / Self Care | Payer: Self-pay | Attending: Emergency Medicine | Admitting: Emergency Medicine

## 2023-08-27 ENCOUNTER — Emergency Department: Payer: Self-pay

## 2023-08-27 DIAGNOSIS — K648 Other hemorrhoids: Secondary | ICD-10-CM | POA: Insufficient documentation

## 2023-08-27 DIAGNOSIS — K625 Hemorrhage of anus and rectum: Secondary | ICD-10-CM

## 2023-08-27 DIAGNOSIS — R1032 Left lower quadrant pain: Secondary | ICD-10-CM

## 2023-08-27 DIAGNOSIS — I509 Heart failure, unspecified: Secondary | ICD-10-CM | POA: Insufficient documentation

## 2023-08-27 DIAGNOSIS — E119 Type 2 diabetes mellitus without complications: Secondary | ICD-10-CM | POA: Insufficient documentation

## 2023-08-27 DIAGNOSIS — K219 Gastro-esophageal reflux disease without esophagitis: Secondary | ICD-10-CM | POA: Insufficient documentation

## 2023-08-27 LAB — URINALYSIS, ROUTINE W REFLEX MICROSCOPIC
Bilirubin Urine: NEGATIVE
Glucose, UA: NEGATIVE mg/dL
Hgb urine dipstick: NEGATIVE
Ketones, ur: NEGATIVE mg/dL
Leukocytes,Ua: NEGATIVE
Nitrite: NEGATIVE
Protein, ur: NEGATIVE mg/dL
Specific Gravity, Urine: 1.034 — ABNORMAL HIGH (ref 1.005–1.030)
pH: 7 (ref 5.0–8.0)

## 2023-08-27 LAB — COMPREHENSIVE METABOLIC PANEL
ALT: 13 U/L (ref 0–44)
AST: 14 U/L — ABNORMAL LOW (ref 15–41)
Albumin: 4.2 g/dL (ref 3.5–5.0)
Alkaline Phosphatase: 61 U/L (ref 38–126)
Anion gap: 9 (ref 5–15)
BUN: 9 mg/dL (ref 6–20)
CO2: 25 mmol/L (ref 22–32)
Calcium: 8.9 mg/dL (ref 8.9–10.3)
Chloride: 106 mmol/L (ref 98–111)
Creatinine, Ser: 0.95 mg/dL (ref 0.61–1.24)
GFR, Estimated: >60 mL/min (ref >60)
Glucose, Bld: 103 mg/dL — ABNORMAL HIGH (ref 70–99)
Potassium: 3.7 mmol/L (ref 3.5–5.1)
Sodium: 140 mmol/L (ref 135–145)
Total Bilirubin: 0.7 mg/dL (ref 0.3–1.2)
Total Protein: 7.1 g/dL (ref 6.5–8.1)

## 2023-08-27 LAB — CBC
HCT: 39.3 % (ref 39.0–52.0)
Hemoglobin: 12.2 g/dL — ABNORMAL LOW (ref 13.0–17.0)
MCH: 25.5 pg — ABNORMAL LOW (ref 26.0–34.0)
MCHC: 31 g/dL (ref 30.0–36.0)
MCV: 82.2 fL (ref 80.0–100.0)
Platelets: 192 10*3/uL (ref 150–400)
RBC: 4.78 MIL/uL (ref 4.22–5.81)
RDW: 13 % (ref 11.5–15.5)
WBC: 5 10*3/uL (ref 4.0–10.5)
nRBC: 0 % (ref 0.0–0.2)

## 2023-08-27 LAB — LIPASE, BLOOD: Lipase: 29 U/L (ref 11–51)

## 2023-08-27 LAB — CBG MONITORING, ED: Glucose-Capillary: 104 mg/dL — ABNORMAL HIGH (ref 70–99)

## 2023-08-27 MED ORDER — IOHEXOL 300 MG/ML  SOLN
100.0000 mL | Freq: Once | INTRAMUSCULAR | Status: AC | PRN
  Administered 2023-08-27: 100 mL via INTRAVENOUS

## 2023-08-27 MED ORDER — MYLANTA COAT & COOL 1200-270-80 MG/10ML PO SUSP: 20.0000 mL | Freq: Every day | ORAL | 0 refills | Status: AC

## 2023-08-27 MED ORDER — POLYETHYLENE GLYCOL 3350 17 G PO PACK: 17.0000 g | PACK | Freq: Two times a day (BID) | ORAL | 0 refills | Status: DC

## 2023-08-27 MED ORDER — METAMUCIL SMOOTH TEXTURE 58.6 % PO POWD: 1.0000 | Freq: Two times a day (BID) | ORAL | 0 refills | Status: AC

## 2023-08-27 NOTE — ED Notes (Addendum)
See triage note   Presents with some epigastric pain and then he vomited times 2 this am  States he noticed blood at that time  States he felt a little better    Then noticed some blood in stools after he went to work

## 2023-08-27 NOTE — ED Provider Notes (Signed)
[facility] Provider Note    Event Date/Time   First MD Initiated Contact with Patient 08/27/23 1122     (approximate)   History   Rectal Bleeding   HPI  Roger Pennington is a 47 y.o. male   Past medical history of perineal abscess, diabetes, prior stroke, CHF, upper GI bleeding on Prilosec, who presents emergency department with rectal bleeding.  Had a normal bowel movement this morning formed brown stool with surrounding bright red blood.  No pain in the rectum.  Has had hemorrhoids diagnosed in the past.  Had left lower quadrant pain, cramping intermittent for the last couple days as well.  No fever.  No urinary symptoms.  Denies any abscesses or skin changes.  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      Physical Exam   Triage Vital Signs: ED Triage Vitals  Encounter Vitals Group     BP 08/27/23 1110 (!) 168/102     Systolic BP Percentile --      Diastolic BP Percentile --      Pulse Rate 08/27/23 1110 70     Resp 08/27/23 1110 20     Temp 08/27/23 1110 98.1 F (36.7 C)     Temp Source 08/27/23 1110 Oral     SpO2 08/27/23 1110 97 %     Weight 08/27/23 1108 251 lb 15.8 oz (114.3 kg)     Height 08/27/23 1108 6' (1.829 m)     Head Circumference --      Peak Flow --      Pain Score 08/27/23 1108 7     Pain Loc --      Pain Education --      Exclude from Growth Chart --     Most recent vital signs: Vitals:   08/27/23 1110  BP: (!) 168/102  Pulse: 70  Resp: 20  Temp: 98.1 F (36.7 C)  SpO2: 97%    General: Awake, no distress.  CV:  Good peripheral perfusion.  Resp:  Normal effort.  Abd:  No distention.  Other:  Awake alert comfortable pleasant gentleman no acute distress  hypertensive otherwise vital signs normal, afebrile.  Left lower quadrant tenderness to palpation without rigidity or guarding.  Rectal exam shows no active bleeding, no blood or melena.  There is no obvious skin changes or abscesses to the perineal area.   ED Results / Procedures / Treatments   Labs (all labs ordered are listed, but only abnormal results are displayed) Labs Reviewed  COMPREHENSIVE METABOLIC PANEL - Abnormal; Notable for the following components:      Result Value   Glucose, Bld 103 (*)    AST 14 (*)    All other components within normal limits  CBC - Abnormal; Notable for the following components:   Hemoglobin 12.2 (*)    MCH 25.5 (*)    All other components within normal limits  URINALYSIS, ROUTINE W REFLEX MICROSCOPIC - Abnormal; Notable for the following components:   Color, Urine YELLOW (*)    APPearance CLEAR (*)    Specific Gravity, Urine 1.034 (*)    All other components within normal limits  CBG MONITORING, ED - Abnormal; Notable for the following components:   Glucose-Capillary 104 (*)    All other  components within normal limits  LIPASE, BLOOD     I ordered and reviewed the above labs they are notable for his H&H is stable from prior testing.  White blood cell count is normal.    RADIOLOGY I independently reviewed and interpreted CT scan abdomen pelvis & see no obvious obstructive or inflammatory changes I also reviewed radiologist's formal read.   PROCEDURES:  Critical Care performed: No  Procedures   MEDICATIONS ORDERED IN ED: Medications  iohexol (OMNIPAQUE) 300 MG/ML solution 100 mL (100 mLs Intravenous Contrast Given 08/27/23 1321)    IMPRESSION / MDM / ASSESSMENT AND PLAN / ED COURSE  I reviewed the triage vital signs and the nursing notes.                                Patient's presentation is most consistent with acute presentation with potential threat to life or bodily function.  Differential diagnosis includes, but is  not limited to, rectal bleeding, hemorrhoids, intra-abdominal infection, diverticulitis, acute blood loss anemia   The patient is on the cardiac monitor to evaluate for evidence of arrhythmia and/or significant heart rate changes.  MDM:    Intermittent vaginal bleeding, most likely hemorrhoids in the setting of known hemorrhoids with mild bleeding, negative rectal exam and I doubt significant blood loss given normal vital signs and normal H&H.  He does have left lower quadrant tenderness to palpation however, will obtain CT scan of the abdomen pelvis to assess for intra-abdominal infection like diverticulitis.   --  If imaging unremarkable plan will be for discharge and GI follow-up.        FINAL CLINICAL IMPRESSION(S) / ED DIAGNOSES   Final diagnoses:  Gastroesophageal reflux disease, unspecified whether esophagitis present  Rectal bleeding  Internal hemorrhoids  LLQ pain     Rx / DC Orders   ED Discharge Orders          Ordered    polyethylene glycol (MIRALAX) 17 g packet  2 times daily        08/27/23 1255    Cal Carb-Mag Hydrox-Simeth (MYLANTA COAT & COOL) 1200-270-80 MG/10ML SUSP  Daily        08/27/23 1255    Ambulatory referral to Gastroenterology        08/27/23 1333             Note:  This document was prepared using Dragon voice recognition software and may include unintentional dictation errors.    Pilar Jarvis, MD 08/27/23 [phone]

## 2023-08-27 NOTE — Discharge Instructions (Addendum)
Fortunately your testing in the emergency department did not show any emergency conditions like significant blood loss, or infections inside of your abdomen, or blockages that would require surgery.  Take medications as prescribed.  Call your gastroenterologist for follow-up appointment to discuss your hemorrhoids, as well as your acid reflux.  Thank you for choosing [place] for your health care today!  Please see your primary doctor this week for a follow up appointment.   If you have any new, worsening, or unexpected symptoms call your doctor right away or come back to the emergency department for reevaluation.  It was my pleasure to care for you today.   Daneil Dan Modesto Charon, MD

## 2023-08-27 NOTE — Group Note (Deleted)

## 2023-08-27 NOTE — ED Triage Notes (Signed)
Pt here with rectal bleeding that started this morning. Pt states blood was bright red in color with no clots. Pt states this has happened before and had to have his ulcers "sewed up".  Pt states his "insides are hot" and is having NVD.

## 2023-10-03 ENCOUNTER — Telehealth: Payer: Self-pay

## 2023-10-03 NOTE — Telephone Encounter (Signed)
Received patient confirmation of new patient appointment and new patient paper return in the mail. It said no longer at this address and unable to forward.

## 2023-10-17 ENCOUNTER — Encounter: Payer: Self-pay | Admitting: Emergency Medicine

## 2023-10-17 ENCOUNTER — Emergency Department: Payer: Self-pay

## 2023-10-17 ENCOUNTER — Emergency Department
Admission: EM | Admit: 2023-10-17 | Discharge: 2023-10-17 | Disposition: A | Discharge status: Home / Self Care | Payer: Self-pay | Attending: Emergency Medicine | Admitting: Emergency Medicine

## 2023-10-17 ENCOUNTER — Other Ambulatory Visit: Payer: Self-pay

## 2023-10-17 DIAGNOSIS — M778 Other enthesopathies, not elsewhere classified: Secondary | ICD-10-CM

## 2023-10-17 DIAGNOSIS — Y9389 Activity, other specified: Secondary | ICD-10-CM | POA: Insufficient documentation

## 2023-10-17 DIAGNOSIS — M70841 Other soft tissue disorders related to use, overuse and pressure, right hand: Secondary | ICD-10-CM | POA: Insufficient documentation

## 2023-10-17 MED ORDER — OXYCODONE-ACETAMINOPHEN 5-325 MG PO TABS
1.0000 | ORAL_TABLET | Freq: Once | ORAL | Status: AC
  Administered 2023-10-17: 1 via ORAL
  Filled 2023-10-17: qty 1

## 2023-10-17 MED ORDER — NAPROXEN 500 MG PO TABS: 500.0000 mg | ORAL_TABLET | Freq: Two times a day (BID) | ORAL | 0 refills | Status: AC

## 2023-10-17 NOTE — Discharge Instructions (Addendum)
Please pick up your prescription from the pharmacy and take as prescribed.  Do not take Advil with this as they are in the same medication class.  You can also take Tylenol 650 mg every 6 hours help with pain symptoms.  You can apply ice to the affected area which should also help.

## 2023-10-17 NOTE — ED Triage Notes (Signed)
Pt via POV from home. Pt c/o R wrist pain and R hand pain since last Friday, states pain was worse last night. Denies any specific injury. Pt is A&Ox4 and NAD

## 2023-10-17 NOTE — ED Provider Notes (Signed)
[facility] Provider Note    Event Date/Time   First MD Initiated Contact with Patient 10/17/23 1954     (approximate)   History   Wrist Pain   HPI Roger Pennington is a 47 y.o. male presented today for right wrist pain.  Patient states on Friday started developing pain to the volar aspect of his right wrist.  He notices pain when he tries to flex his wrist as well as clench his fingers.  Denies any direct trauma to the area.  Denies fevers or chills.  Denies shortness of breath.  Denies swelling to the area or redness.  Denies numbness.     Physical Exam   Triage Vital Signs: ED Triage Vitals  Encounter Vitals Group     BP 10/17/23 1736 (!) 163/103     Systolic BP Percentile --      Diastolic BP Percentile --      Pulse Rate 10/17/23 1736 71     Resp 10/17/23 1736 20     Temp 10/17/23 1736 98 F (36.7 C)     Temp Source 10/17/23 1736 Oral     SpO2 10/17/23 1736 96 %     Weight 10/17/23 1735 255 lb (115.7 kg)     Height 10/17/23 1735 6' (1.829 m)     Head Circumference --      Peak Flow --      Pain Score 10/17/23 1735 10     Pain Loc --      Pain Education --      Exclude from Growth Chart --     Most recent vital signs: Vitals:   10/17/23 1736  BP: (!) 163/103  Pulse: 71  Resp: 20  Temp: 98 F (36.7 C)  SpO2: 96%   I have reviewed the vital signs. General:  Awake, alert, no acute distress. Head:  Normocephalic, Atraumatic. EENT:  PERRL, EOMI, Oral mucosa pink and moist, Neck is supple. Cardiovascular: Regular rate, 2+ distal pulses. Respiratory:  Normal respiratory effort, symmetrical expansion, no distress.   Extremities:  Moving all four extremities through full ROM without pain.  Tenderness with palpation over the flexor surface tendons of the right distal forearm.  Pain with flexion of the forearm as well as grip strength.  Strength only diminished secondary to pain.  No numbness noted.  No swelling or erythema to the  area. Neuro:  Alert and oriented.  Interacting appropriately.   Skin:  Warm, dry, no rash.   Psych: Appropriate affect.    ED Results / Procedures / Treatments   Labs (all labs ordered are listed, but only abnormal results are displayed) Labs Reviewed - No data to display   EKG    RADIOLOGY Independently interpreted right wrist x-ray with no acute pathology   PROCEDURES:  Critical Care performed: No  Procedures   MEDICATIONS ORDERED IN ED: Medications  oxyCODONE-acetaminophen (PERCOCET/ROXICET) 5-325 MG per tablet 1 tablet (has no administration in time range)     IMPRESSION / MDM / ASSESSMENT AND PLAN / ED COURSE  I reviewed the triage vital signs and the nursing notes.                              Differential diagnosis includes, but is not limited to, flexor tendinitis, soft tissue swelling, less likely traumatic injury.  Patient's presentation is most consistent with acute complicated illness / injury requiring diagnostic workup.  Patient is  a 47 year old male presenting today for right wrist pain over the past 5 days.  Has some tenderness to palpation over the right wrist but no obvious swelling or erythema.  Pain increased with flexion of the wrist as well as attempting to grip.  No obvious strength deficits only limited by pain.  No numbness to the area.  X-ray of right wrist negative for acute osseous abnormalities.  No concern for cellulitis.  Low suspicion for gout given the position of the pain on the volar flexor surface of the distal forearm.  Concern at this time for tendinitis in the area.  Will place patient in a wrist splint for comfort and discharged home with NSAIDs.  Was given follow-up with orthopedics for ongoing management outpatient.     FINAL CLINICAL IMPRESSION(S) / ED DIAGNOSES   Final diagnoses:  Right wrist tendonitis     Rx / DC Orders   ED Discharge Orders          Ordered    AMB referral to orthopedics       Comments:  Outpatient management of right wrist flexor tendinitis   10/17/23 2011    naproxen (NAPROSYN) 500 MG tablet  2 times daily with meals        10/17/23 2011             Note:  This document was prepared using Dragon voice recognition software and may include unintentional dictation errors.   Janith Lima, MD 10/17/23 2012

## 2023-11-01 ENCOUNTER — Ambulatory Visit: Payer: Self-pay | Admitting: Physician Assistant

## 2023-11-07 ENCOUNTER — Other Ambulatory Visit: Payer: Self-pay

## 2023-11-12 ENCOUNTER — Encounter: Payer: Self-pay | Admitting: Physician Assistant

## 2023-11-12 ENCOUNTER — Telehealth: Payer: Self-pay | Admitting: Physician Assistant

## 2023-11-12 ENCOUNTER — Ambulatory Visit (INDEPENDENT_AMBULATORY_CARE_PROVIDER_SITE_OTHER): Payer: Self-pay | Admitting: Physician Assistant

## 2023-11-12 VITALS — BP 155/102 | HR 84 | Temp 98.4°F | Ht 72.0 in | Wt 255.6 lb

## 2023-11-12 DIAGNOSIS — K529 Noninfective gastroenteritis and colitis, unspecified: Secondary | ICD-10-CM

## 2023-11-12 DIAGNOSIS — I10 Essential (primary) hypertension: Secondary | ICD-10-CM

## 2023-11-12 DIAGNOSIS — K219 Gastro-esophageal reflux disease without esophagitis: Secondary | ICD-10-CM

## 2023-11-12 DIAGNOSIS — F1290 Cannabis use, unspecified, uncomplicated: Secondary | ICD-10-CM

## 2023-11-12 DIAGNOSIS — R079 Chest pain, unspecified: Secondary | ICD-10-CM

## 2023-11-12 DIAGNOSIS — R112 Nausea with vomiting, unspecified: Secondary | ICD-10-CM

## 2023-11-12 DIAGNOSIS — Z87891 Personal history of nicotine dependence: Secondary | ICD-10-CM

## 2023-11-12 DIAGNOSIS — K648 Other hemorrhoids: Secondary | ICD-10-CM

## 2023-11-12 DIAGNOSIS — K649 Unspecified hemorrhoids: Secondary | ICD-10-CM

## 2023-11-12 DIAGNOSIS — Z8673 Personal history of transient ischemic attack (TIA), and cerebral infarction without residual deficits: Secondary | ICD-10-CM

## 2023-11-12 DIAGNOSIS — K625 Hemorrhage of anus and rectum: Secondary | ICD-10-CM

## 2023-11-12 MED ORDER — OMEPRAZOLE 40 MG PO CPDR: 40.0000 mg | DELAYED_RELEASE_CAPSULE | Freq: Every day | ORAL | 1 refills | Status: DC

## 2023-11-12 NOTE — Patient Instructions (Signed)
Referral to Cardiology. Someone from their office will call to schedule an appointment.

## 2023-11-12 NOTE — Progress Notes (Signed)
Roger Amy, PA-C [address]  [address]  [place], [place] [address]  Main: [phone]  Fax: [phone]   Gastroenterology Consultation  Referring Provider:     Center, Darcella Gasman* Primary Care Physician:  Center, Phineas Real [facility] Primary Gastroenterologist:  Roger Amy, PA-C  Reason for Consultation:     Chest Pain, GERD, Diarrhea        HPI:   Roger Pennington is a 47 y.o. y/o male referred for consultation & management  by Center, Phineas Real [facility].    He presents for evaluation of multiple GI symptoms.  Primary concern today is chest pain.  He reports tightness in his upper chest mostly on the right mid chest.  He has a lot of burning and acid reflux.  Currently taking pantoprazole 40 Mg once daily which is not controlling his reflux.  He smokes marijuana daily.  Has episodes of chronic nausea and vomiting.  Has chronic diarrhea for over a year.  Occasional episode of rectal bleeding attributed to internal hemorrhoids.  No recent rectal bleeding.  He reports having a MI and CVA in 2023.  Has not seen cardiology in a year.  He Is not taking 81 mg aspirin that was recommended.  No blood thinners.  Blood pressure is elevated.  History of bleeding stomach ulcer in 2017.  Saw GI in [place] last year and had EGD and colonoscopy.  Recently moved to [place] and is requesting local care.  He also request referral to a local cardiologist.  Labs 08/27/2023 showed hemoglobin 12.2, MCV 82.  Baseline hemoglobin has been 12.2 for several years.  CMP and lipase normal.  CT abdomen pelvis with contrast 08/27/2023 (to evaluate rectal bleeding): No acute abnormality.  Colonoscopy 08/2022 at [facility] in [place] (Dr. Duwaine Maxin): Medium internal hemorrhoids, prep was poor in certain segments, no polyps.  Biopsies were negative for microscopic colitis.  EGD 08/2022: Duodenal biopsies negative for celiac.  EGD 01/2016: LA grade C  esophagitis, moderate gastritis, small hiatal hernia, 4 mm nonbleeding cratered gastric ulcer, mild duodenitis.  PMHx including HTN, MI, CVA, GERD, type 2 Diabetes, perineal abscess, depression, hx of drug overdose, cannabis use, CHF.     Past Medical History:  Diagnosis Date   Diabetes mellitus without complication (HCC)    Hypertension    Tobacco abuse    Type 2 diabetes mellitus, with long-term current use of insulin (HCC) 06/01/2021    Past Surgical History:  Procedure Laterality Date   ESOPHAGOGASTRODUODENOSCOPY (EGD) WITH PROPOFOL N/A 02/09/2016   Procedure: ESOPHAGOGASTRODUODENOSCOPY (EGD) WITH PROPOFOL;  Surgeon: Christena Deem, MD;  Location: [facility] ENDOSCOPY;  Service: Endoscopy;  Laterality: N/A;   INCISION AND DRAINAGE ABSCESS Right 05/25/2020   Procedure: INCISION AND DRAINAGE ABSCESS;  Surgeon: Leafy Ro, MD;  Location: [facility] ORS;  Service: General;  Laterality: Right;    Prior to Admission medications   Medication Sig Start Date End Date Taking? Authorizing Provider  acetaminophen (TYLENOL) 500 MG tablet Take 500 mg by mouth every 6 (six) hours as needed for moderate pain.    [provider]  atorvastatin (LIPITOR) 80 MG tablet Take 1 tablet (80 mg total) by mouth daily. 02/10/22   Hollice Espy, MD  carvedilol (COREG) 3.125 MG tablet Take 1 tablet (3.125 mg total) by mouth 2 (two) times daily. 02/09/22 02/09/23  Hollice Espy, MD  clotrimazole (LOTRIMIN) 1 % cream Apply 1 application topically 2 (two) times daily.  Apply to pt's feet 11/29/21   [provider]  famotidine (PEPCID) 10 MG tablet Take 1 tablet (10 mg total) by mouth as needed for heartburn or indigestion. 02/09/22   Hollice Espy, MD  insulin glargine (LANTUS SOLOSTAR) 100 UNIT/ML Solostar Pen Inject 50 Units into the skin daily. 09/02/20   [provider]  Insulin Pen Needle 32G X 4 MM MISC 90 Units by Does not apply route 3 (three) times daily. 05/28/20   Delfino Lovett,  MD  lisinopril (ZESTRIL) 5 MG tablet Take 1 tablet (5 mg total) by mouth daily. 02/09/22 02/09/23  Hollice Espy, MD  losartan (COZAAR) 50 MG tablet Take 50 mg by mouth daily. 08/13/23   [provider]  metoprolol tartrate (LOPRESSOR) 100 MG tablet Take 2 hours prior to Cardiac CT 04/04/22   Chandrasekhar, Lafayette Dragon A, MD  nicotine (NICODERM CQ - DOSED IN MG/24 HOURS) 21 mg/24hr patch Place 1 patch (21 mg total) onto the skin daily. 02/10/22   Hollice Espy, MD  pantoprazole (PROTONIX) 40 MG tablet Take 40 mg by mouth daily. 08/13/23   [provider]  TRADJENTA 5 MG TABS tablet Take 5 mg by mouth daily. 05/01/20   [provider]    Family History  Problem Relation Age of Onset   Hypertension Father    Hypertension Brother      Social History   Tobacco Use   Smoking status: Former    Types: Cigarettes   Smokeless tobacco: Never  Substance Use Topics   Alcohol use: Yes    Alcohol/week: 0.0 standard drinks of alcohol    Comment: occ   Drug use: Yes    Types: Marijuana    Comment: occ    Allergies as of 11/12/2023   (No Known Allergies)    Review of Systems:    All systems reviewed and negative except where noted in HPI.   Physical Exam:  BP (!) 155/102   Pulse 84   Temp 98.4 F (36.9 C)   Ht 6' (1.829 m)   Wt 255 lb 9.6 oz (115.9 kg)   BMI 34.67 kg/m  No LMP for male patient.  General:   Alert,  Well-developed, well-nourished, pleasant and cooperative in NAD Lungs:  Respirations even and unlabored.  Clear throughout to auscultation.   No wheezes, crackles, or rhonchi. No acute distress. Heart:  Regular rate and rhythm; no murmurs, clicks, rubs, or gallops. Abdomen:  Normal bowel sounds.  No bruits.  Soft, and non-distended without masses, hepatosplenomegaly or hernias noted.  No Tenderness.  No guarding or rebound tenderness.    Neurologic:  Alert and oriented x3;  grossly normal neurologically. Psych:  Alert and cooperative. Normal mood  and affect.  Imaging Studies: DG Hand Complete Right  Result Date: 10/17/2023 CLINICAL DATA:  Hand pain. EXAM: RIGHT HAND - COMPLETE 3+ VIEW COMPARISON:  None Available. FINDINGS: There is no evidence of fracture or dislocation. There is no evidence of arthropathy or other focal bone abnormality. Soft tissues are unremarkable. IMPRESSION: Negative. Electronically Signed   By: Darliss Cheney M.D.   On: 10/17/2023 19:38    Assessment and Plan:   Carold Brys is a 47 y.o. y/o male has been referred for:  Chest Pain; History of MI in 2023; Hx CVA; Uncontrolled Hypertension  Refer to local cardiologist.  He will need cardiac evaluation and clearance before scheduling Colonoscopy or EGD.   GERD - Not controlled. Stop pantoprazole. Start Rx omeprazole 40 Mg once  daily every morning. Start OTC Pepcid 20 Mg twice daily as needed.  Take OTC antacid as needed. Recommend Lifestyle Modifications to prevent Acid Reflux.  Rec. Avoid coffee, sodas, peppermint, citrus fruits, and spicey foods.  Avoid eating 2-3 hours before bedtime.   3.  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  4.   Chronic Diarrhea EGD and Colonoscopy done 08/2022 in [facility] showed no evidence of celiac, microscopic colitis, or IBD.  Biopsies negative. Recommend he take OTC Imodium 2 Mg every 4-6 hours as needed, max 8 tablets/day.  5.  Internal Hemorrhoids with episodic rectal bleeding; currently asymptomatic.  I offered hydrocortisone suppositories for treatment, and patient declined.  Last colonoscopy 08/2022 showed suboptimal prep and Internal Hemorrhoids.  He needs a repeat colonoscopy with extra 2-day prep.  Need cardiac evaluation and clearance first, given his chest pain symptoms.  Follow up in 6 weeks to decide about EGD / Colonoscopy.  Roger Amy, PA-C

## 2023-11-12 NOTE — Telephone Encounter (Signed)
Patient and his girl friend called in to double his appointment. I inform him about his appointment today at 3:30 pm.

## 2023-11-26 ENCOUNTER — Ambulatory Visit: Payer: Self-pay | Attending: Nurse Practitioner | Admitting: Nurse Practitioner

## 2023-11-27 ENCOUNTER — Encounter: Payer: Self-pay | Admitting: Nurse Practitioner

## 2023-12-18 ENCOUNTER — Other Ambulatory Visit: Payer: Self-pay

## 2023-12-18 ENCOUNTER — Encounter: Payer: Self-pay | Admitting: Medical Oncology

## 2023-12-18 ENCOUNTER — Emergency Department
Admission: EM | Admit: 2023-12-18 | Discharge: 2023-12-18 | Disposition: A | Discharge status: Home / Self Care | Payer: Self-pay | Attending: Emergency Medicine | Admitting: Emergency Medicine

## 2023-12-18 ENCOUNTER — Emergency Department: Payer: Self-pay

## 2023-12-18 DIAGNOSIS — Z20822 Contact with and (suspected) exposure to covid-19: Secondary | ICD-10-CM | POA: Insufficient documentation

## 2023-12-18 DIAGNOSIS — F17200 Nicotine dependence, unspecified, uncomplicated: Secondary | ICD-10-CM | POA: Insufficient documentation

## 2023-12-18 DIAGNOSIS — K219 Gastro-esophageal reflux disease without esophagitis: Secondary | ICD-10-CM

## 2023-12-18 DIAGNOSIS — E119 Type 2 diabetes mellitus without complications: Secondary | ICD-10-CM | POA: Insufficient documentation

## 2023-12-18 DIAGNOSIS — I10 Essential (primary) hypertension: Secondary | ICD-10-CM | POA: Insufficient documentation

## 2023-12-18 DIAGNOSIS — R6889 Other general symptoms and signs: Secondary | ICD-10-CM | POA: Insufficient documentation

## 2023-12-18 DIAGNOSIS — R1013 Epigastric pain: Secondary | ICD-10-CM | POA: Insufficient documentation

## 2023-12-18 LAB — COMPREHENSIVE METABOLIC PANEL
ALT: 19 U/L (ref 0–44)
AST: 17 U/L (ref 15–41)
Albumin: 4 g/dL (ref 3.5–5.0)
Alkaline Phosphatase: 63 U/L (ref 38–126)
Anion gap: 8 (ref 5–15)
BUN: 9 mg/dL (ref 6–20)
CO2: 24 mmol/L (ref 22–32)
Calcium: 8.9 mg/dL (ref 8.9–10.3)
Chloride: 103 mmol/L (ref 98–111)
Creatinine, Ser: 0.95 mg/dL (ref 0.61–1.24)
GFR, Estimated: >60 mL/min (ref >60)
Glucose, Bld: 153 mg/dL — ABNORMAL HIGH (ref 70–99)
Potassium: 3.7 mmol/L (ref 3.5–5.1)
Sodium: 135 mmol/L (ref 135–145)
Total Bilirubin: 1 mg/dL (ref 0.0–1.2)
Total Protein: 7.4 g/dL (ref 6.5–8.1)

## 2023-12-18 LAB — LIPASE, BLOOD: Lipase: 30 U/L (ref 11–51)

## 2023-12-18 LAB — CBC
HCT: 38.5 % — ABNORMAL LOW (ref 39.0–52.0)
Hemoglobin: 12 g/dL — ABNORMAL LOW (ref 13.0–17.0)
MCH: 25 pg — ABNORMAL LOW (ref 26.0–34.0)
MCHC: 31.2 g/dL (ref 30.0–36.0)
MCV: 80.2 fL (ref 80.0–100.0)
Platelets: 187 10*3/uL (ref 150–400)
RBC: 4.8 MIL/uL (ref 4.22–5.81)
RDW: 12.7 % (ref 11.5–15.5)
WBC: 6.6 10*3/uL (ref 4.0–10.5)
nRBC: 0 % (ref 0.0–0.2)

## 2023-12-18 LAB — RESP PANEL BY RT-PCR (RSV, FLU A&B, COVID)  RVPGX2
Influenza A by PCR: NEGATIVE
Influenza B by PCR: NEGATIVE
Resp Syncytial Virus by PCR: NEGATIVE
SARS Coronavirus 2 by RT PCR: NEGATIVE

## 2023-12-18 LAB — TROPONIN I (HIGH SENSITIVITY): Troponin I (High Sensitivity): 6 ng/L (ref <18)

## 2023-12-18 MED ORDER — OMEPRAZOLE 40 MG PO CPDR: 40.0000 mg | DELAYED_RELEASE_CAPSULE | Freq: Every day | ORAL | 1 refills | Status: DC

## 2023-12-18 MED ORDER — CARVEDILOL 3.125 MG PO TABS: 3.1250 mg | ORAL_TABLET | Freq: Two times a day (BID) | ORAL | 11 refills | Status: DC

## 2023-12-18 MED ORDER — LANTUS SOLOSTAR 100 UNIT/ML ~~LOC~~ SOPN: 50.0000 [IU] | PEN_INJECTOR | Freq: Every day | SUBCUTANEOUS | 1 refills | Status: AC

## 2023-12-18 MED ORDER — ATORVASTATIN CALCIUM 80 MG PO TABS: 80.0000 mg | ORAL_TABLET | Freq: Every day | ORAL | 1 refills | Status: AC

## 2023-12-18 MED ORDER — INSULIN PEN NEEDLE 32G X 4 MM MISC: 90.0000 [IU] | Freq: Three times a day (TID) | 10 refills | Status: AC

## 2023-12-18 MED ORDER — LISINOPRIL 5 MG PO TABS: 5.0000 mg | ORAL_TABLET | Freq: Every day | ORAL | 11 refills | Status: AC

## 2023-12-18 MED ORDER — FAMOTIDINE 20 MG PO TABS
20.0000 mg | ORAL_TABLET | Freq: Once | ORAL | Status: AC
  Administered 2023-12-18: 20 mg via ORAL
  Filled 2023-12-18: qty 1

## 2023-12-18 MED ORDER — KETOROLAC TROMETHAMINE 30 MG/ML IJ SOLN
30.0000 mg | Freq: Once | INTRAMUSCULAR | Status: AC
  Administered 2023-12-18: 30 mg via INTRAMUSCULAR
  Filled 2023-12-18: qty 1

## 2023-12-18 MED ORDER — ONDANSETRON 4 MG PO TBDP
4.0000 mg | ORAL_TABLET | Freq: Once | ORAL | Status: AC
  Administered 2023-12-18: 4 mg via ORAL
  Filled 2023-12-18: qty 1

## 2023-12-18 NOTE — ED Provider Triage Note (Signed)
 Emergency Medicine Provider Triage Evaluation Note  Tracer Gutridge , a 47 y.o. male  was evaluated in triage.  Pt complains of cough, congestion, body aches. No CP/SOB. No abd pain, n/v/d.  Review of Systems  Positive: Cough, congestion, body aches Negative: fever  Physical Exam  There were no vitals taken for this visit. Gen:   Awake, no distress   Resp:  Normal effort  MSK:   Moves extremities without difficulty  Other:    Medical Decision Making  Medically screening exam initiated at 9:02 AM.  Appropriate orders placed.  [name] was informed that the remainder of the evaluation will be completed by another provider, this initial triage assessment does not replace that evaluation, and the importance of remaining in the ED until their evaluation is complete.     Stevenson Windmiller E, PA-C 12/18/23 9096

## 2023-12-18 NOTE — ED Provider Notes (Signed)
 [facility] Provider Note    Event Date/Time   First MD Initiated Contact with Patient 12/18/23 1049     (approximate)   History   Generalized Body Aches and Cough   HPI  Roger Pennington is a 47 y.o. male past history significant for hypertension, hyperlipidemia, diabetes, tobacco use, who presents to the emergency department for not feeling well.  States that he woke up today and started not feeling well with generalized bodyaches and fatigue.  Mild epigastric abdominal pain that radiates up to his chest.  Complaining of some nausea.  Denies any chest pain, shortness of breath.  No vomiting or diarrhea.  Complaining of a sore throat and congestion.  No known sick contacts.  States that he is supposed to be on multiple medications but a lot been taking as prescribed and has been out of multiple medications.     Physical Exam   Triage Vital Signs: ED Triage Vitals  Encounter Vitals Group     BP 12/18/23 0903 (!) 181/118     Systolic BP Percentile --      Diastolic BP Percentile --      Pulse Rate 12/18/23 0903 72     Resp 12/18/23 0903 19     Temp 12/18/23 0903 98.1 F (36.7 C)     Temp Source 12/18/23 0903 Oral     SpO2 12/18/23 0903 97 %     Weight 12/18/23 0904 253 lb 8.5 oz (115 kg)     Height 12/18/23 0904 6' (1.829 m)     Head Circumference --      Peak Flow --      Pain Score 12/18/23 0903 9     Pain Loc --      Pain Education --      Exclude from Growth Chart --     Most recent vital signs: Vitals:   12/18/23 0903  BP: (!) 181/118  Pulse: 72  Resp: 19  Temp: 98.1 F (36.7 C)  SpO2: 97%    Physical Exam Constitutional:      Appearance: He is well-developed.  HENT:     Head: Atraumatic.  Eyes:     Conjunctiva/sclera: Conjunctivae normal.  Cardiovascular:     Rate and Rhythm: Regular rhythm.  Pulmonary:     Effort: No respiratory distress.  Abdominal:     Tenderness: There is abdominal tenderness (Mild epigastric  abdominal tenderness to palpation with no rebound or guarding).  Musculoskeletal:     Cervical back: Normal range of motion.     Right lower leg: No edema.     Left lower leg: No edema.  Skin:    General: Skin is warm.     Capillary Refill: Capillary refill takes less than 2 seconds.  Neurological:     Mental Status: He is alert. Mental status is at baseline.      IMPRESSION / MDM / ASSESSMENT AND PLAN / ED COURSE  I reviewed the triage vital signs and the nursing notes.  Differential diagnosis including viral illness including COVID/influenza, ACS, dehydration, pneumonia, electrolyte abnormality   RADIOLOGY I independently reviewed imaging, my interpretation of imaging: Chest x-ray no focal findings consistent with pneumonia   Labs (all labs ordered are listed, but only abnormal results are displayed) Labs interpreted as -    Labs Reviewed  CBC - Abnormal; Notable for the following components:      Result Value   Hemoglobin 12.0 (*)    HCT 38.5 (*)  MCH 25.0 (*)    All other components within normal limits  COMPREHENSIVE METABOLIC PANEL - Abnormal; Notable for the following components:   Glucose, Bld 153 (*)    All other components within normal limits  RESP PANEL BY RT-PCR (RSV, FLU A&B, COVID)  RVPGX2  LIPASE, BLOOD  TROPONIN I (HIGH SENSITIVITY)    COVID and influenza testing are negative.   Chest x-ray no focal findings consistent with pneumonia.  Creatinine appears to be at his baseline.  No significant electrolyte abnormality.  Troponin is negative have low suspicion for ACS.  Possible gastritis given his recent NSAID use for his headaches.  Will start the patient on PPI.  Given a refill of multiple home medications given his uncontrolled hypertension.  Patient is also supposed to be on insulin  for his diabetes.  Encouraged to follow-up with his primary care physician.  Given return precautions for any ongoing or worsening symptoms.      PROCEDURES:  Critical Care performed: No  Procedures  Patient's presentation is most consistent with acute presentation with potential threat to life or bodily function.   MEDICATIONS ORDERED IN ED: Medications  ketorolac  (TORADOL ) 30 MG/ML injection 30 mg (30 mg Intramuscular Given 12/18/23 1127)  ondansetron  (ZOFRAN -ODT) disintegrating tablet 4 mg (4 mg Oral Given 12/18/23 1127)  famotidine  (PEPCID ) tablet 20 mg (20 mg Oral Given 12/18/23 1127)    FINAL CLINICAL IMPRESSION(S) / ED DIAGNOSES   Final diagnoses:  Flu-like symptoms  Uncontrolled hypertension     Rx / DC Orders   ED Discharge Orders          Ordered    atorvastatin  (LIPITOR) 80 MG tablet  Daily        12/18/23 1225    carvedilol  (COREG ) 3.125 MG tablet  2 times daily,   Status:  Discontinued        12/18/23 1225    insulin  glargine (LANTUS  SOLOSTAR) 100 UNIT/ML Solostar Pen  Daily        12/18/23 1225    Insulin  Pen Needle 32G X 4 MM MISC  3 times daily        12/18/23 1225    lisinopril  (ZESTRIL ) 5 MG tablet  Daily        12/18/23 1225    omeprazole  (PRILOSEC) 40 MG capsule  Daily        12/18/23 1225    carvedilol  (COREG ) 3.125 MG tablet  2 times daily        12/18/23 1225             Note:  This document was prepared using Dragon voice recognition software and may include unintentional dictation errors.   Suzanne Kirsch, MD 12/18/23 1400

## 2023-12-18 NOTE — ED Triage Notes (Signed)
Pt reports he woke up this am with body aches all over and cough, congestion. Pt unsure of fever at home

## 2023-12-24 NOTE — Progress Notes (Deleted)
 Roger Console, PA-C [address]  [address]  [place], [place] [address]  Main: [phone]  Fax: [phone]   Primary Care Physician: No primary care provider on file.  Primary Gastroenterologist:  ***  CC: Follow-up chest pain, GERD, chronic diarrhea  HPI: Roger Pennington is a 48 y.o. male returns for 6-week follow-up of chest pain, GERD, and diarrhea.  Intermittent GI symptoms over a year.  He tried pantoprazole  40 Mg daily which did not control his reflux.  Smokes marijuana daily.  Episodic chronic nausea and vomiting.  Episodes of mild rectal bleeding attributed to hemorrhoids.  History of bleeding stomach ulcer in 2017.  Recently moved to [facility] from [place] and is transferring to our office for local GI care.  I last saw him 11/12/2023.  Blood pressure was moderately elevated 155/102.  I referred him to cardiologist to further evaluate chest pain before we consider scheduling any endoscopy procedures.  He has not yet seen a cardiologist.  12/18/2023 labs: Negative RSV/influenza/COVID.  Normal lipase.  Negative troponin.  Normal white count 6.6.  Stable hemoglobin 12.0.  Elevated glucose 153.  Normal LFTs and BMP.  Labs 08/27/2023 showed hemoglobin 12.2, MCV 82.  Baseline hemoglobin has been 12.2 for several years.  CMP and lipase normal.   CT abdomen pelvis with contrast 08/27/2023 (to evaluate rectal bleeding): No acute abnormality.   Colonoscopy 08/2022 at [facility] in [place] (Dr. Aneta): Medium internal hemorrhoids, prep was poor in certain segments, no polyps.  Biopsies were negative for microscopic colitis.   EGD 08/2022: Duodenal biopsies negative for celiac.   EGD 01/2016: LA grade C esophagitis, moderate gastritis, small hiatal hernia, 4 mm nonbleeding cratered gastric ulcer, mild duodenitis.   PMHx including HTN, MI, CVA, GERD, type 2 Diabetes, perineal abscess, depression, hx of drug overdose, cannabis use, CHF.  He reports  having a MI and CVA in 2023.   Current Outpatient Medications  Medication Sig Dispense Refill   acetaminophen  (TYLENOL ) 500 MG tablet Take 500 mg by mouth every 6 (six) hours as needed for moderate pain.     atorvastatin  (LIPITOR) 80 MG tablet Take 1 tablet (80 mg total) by mouth daily. 30 tablet 1   carvedilol  (COREG ) 3.125 MG tablet Take 1 tablet (3.125 mg total) by mouth 2 (two) times daily. 60 tablet 11   clotrimazole (LOTRIMIN) 1 % cream Apply 1 application topically 2 (two) times daily. Apply to pt's feet     famotidine  (PEPCID ) 10 MG tablet Take 1 tablet (10 mg total) by mouth as needed for heartburn or indigestion. 30 tablet 1   insulin  glargine (LANTUS  SOLOSTAR) 100 UNIT/ML Solostar Pen Inject 50 Units into the skin daily. 15 mL 1   Insulin  Pen Needle 32G X 4 MM MISC 90 Units by Does not apply route 3 (three) times daily. 90 each 10   lisinopril  (ZESTRIL ) 5 MG tablet Take 1 tablet (5 mg total) by mouth daily. 30 tablet 11   losartan (COZAAR) 50 MG tablet Take 50 mg by mouth daily.     metoprolol  tartrate (LOPRESSOR ) 100 MG tablet Take 2 hours prior to Cardiac CT 1 tablet 0   nicotine  (NICODERM CQ  - DOSED IN MG/24 HOURS) 21 mg/24hr patch Place 1 patch (21 mg total) onto the skin daily. 28 patch 0   omeprazole  (PRILOSEC) 40 MG capsule Take 1 capsule (40 mg total) by mouth daily. 90 capsule 1   TRADJENTA  5 MG TABS tablet Take 5  mg by mouth daily.     No current facility-administered medications for this visit.    Allergies as of 12/25/2023   (No Known Allergies)    Past Medical History:  Diagnosis Date   Diabetes mellitus without complication (HCC)    Hypertension    Tobacco abuse    Type 2 diabetes mellitus, with long-term current use of insulin  (HCC) 06/01/2021    Past Surgical History:  Procedure Laterality Date   ESOPHAGOGASTRODUODENOSCOPY (EGD) WITH PROPOFOL  N/A 02/09/2016   Procedure: ESOPHAGOGASTRODUODENOSCOPY (EGD) WITH PROPOFOL ;  Surgeon: Gladis RAYMOND Mariner, MD;   Location: [facility] ENDOSCOPY;  Service: Endoscopy;  Laterality: N/A;   INCISION AND DRAINAGE ABSCESS Right 05/25/2020   Procedure: INCISION AND DRAINAGE ABSCESS;  Surgeon: Jordis Laneta FALCON, MD;  Location: [facility] ORS;  Service: General;  Laterality: Right;    Review of Systems:    All systems reviewed and negative except where noted in HPI.   Physical Examination:   There were no vitals taken for this visit.  General: Well-nourished, well-developed in no acute distress.  Lungs: Clear to auscultation bilaterally. Non-labored. Heart: Regular rate and rhythm, no murmurs rubs or gallops.  Abdomen: Bowel sounds are normal; Abdomen is Soft; No hepatosplenomegaly, masses or hernias;  No Abdominal Tenderness; No guarding or rebound tenderness. Neuro: Alert and oriented x 3.  Grossly intact.  Psych: Alert and cooperative, normal mood and affect.   Imaging Studies: DG Chest 2 View Result Date: 12/18/2023 CLINICAL DATA:  Cough. EXAM: CHEST - 2 VIEW COMPARISON:  Chest radiograph dated August 09, 2022. FINDINGS: The heart size and mediastinal contours are within normal limits. No focal consolidation, pleural effusion, or pneumothorax. No acute osseous abnormality. IMPRESSION: No acute cardiopulmonary findings. Electronically Signed   By: Harrietta Sherry M.D.   On: 12/18/2023 09:54    Assessment and Plan:   Roger Pennington is a 48 y.o. y/o male ***    Roger Console, PA-C  Follow up ***  BP check ***

## 2023-12-25 ENCOUNTER — Ambulatory Visit: Payer: Self-pay | Admitting: Physician Assistant

## 2024-01-11 ENCOUNTER — Ambulatory Visit: Payer: Self-pay | Attending: Medical | Admitting: Medical

## 2024-01-11 NOTE — Progress Notes (Deleted)
Cardiology Office Note:    Date:  01/11/2024   ID:  [name], DOB 07/30/1976, MRN [id number]  PCP:  No primary care provider on file.  [facility] Cardiologist:  None  [facility] Electrophysiologist:  None   Referring MD: Center, Phineas Real [facility]*   Chief Complaint: ***  History of Present Illness:    Eliazar Olivar is a 48 y.o. male with a hx of HTN with DM, morbid obesity, HFmrEF, tobacco abuse, prior cannibis, h/o subacute cortical stroke use who presents for follow-up.   In 01/2022 the patient was found to have sub acute cortical stroke. Echo showed LVEF 45%. No afib was seen on the monitor.  The patient was seen 03/2022 reported chest pain. Cardiac CTA showed coronary calcium score of 0.   Today,    Past Medical History:  Diagnosis Date   Diabetes mellitus without complication (HCC)    Hypertension    Tobacco abuse    Type 2 diabetes mellitus, with long-term current use of insulin (HCC) 06/01/2021    Past Surgical History:  Procedure Laterality Date   ESOPHAGOGASTRODUODENOSCOPY (EGD) WITH PROPOFOL N/A 02/09/2016   Procedure: ESOPHAGOGASTRODUODENOSCOPY (EGD) WITH PROPOFOL;  Surgeon: Christena Deem, MD;  Location: [facility] ENDOSCOPY;  Service: Endoscopy;  Laterality: N/A;   INCISION AND DRAINAGE ABSCESS Right 05/25/2020   Procedure: INCISION AND DRAINAGE ABSCESS;  Surgeon: Leafy Ro, MD;  Location: [facility] ORS;  Service: General;  Laterality: Right;    Current Medications: No outpatient medications have been marked as taking for the 01/11/24 encounter (Appointment) with Fransico Michael, Reagan Behlke H, PA-C.     Allergies:   Patient has no known allergies.   Social History   Socioeconomic History   Marital status: Single    Spouse name: Not on file   Number of children: Not on file   Years of education: Not on file   Highest education level: Not on file  Occupational History   Not on file  Tobacco Use   Smoking status: Former    Types: Cigarettes    Smokeless tobacco: Never  Substance and Sexual Activity   Alcohol use: Yes    Alcohol/week: 0.0 standard drinks of alcohol    Comment: occ   Drug use: Yes    Types: Marijuana    Comment: occ   Sexual activity: Yes  Other Topics Concern   Not on file  Social History Narrative   Not on file   Social Drivers of Health   Financial Resource Strain: Not on file  Food Insecurity: Not on file  Transportation Needs: Not on file  Physical Activity: Not on file  Stress: Not on file  Social Connections: Not on file     Family History: The patient's ***family history includes Hypertension in his brother and father.  ROS:   Please see the history of present illness.    *** All other systems reviewed and are negative.  EKGs/Labs/Other Studies Reviewed:    The following studies were reviewed today: ***  EKG:  EKG is *** ordered today.  The ekg ordered today demonstrates ***  Recent Labs: 12/18/2023: ALT 19; BUN 9; Creatinine, Ser 0.95; Hemoglobin 12.0; Platelets 187; Potassium 3.7; Sodium 135  Recent Lipid Panel    Component Value Date/Time   CHOL 169 02/09/2022 0554   TRIG 135 02/09/2022 0554   HDL 32 (L) 02/09/2022 0554   CHOLHDL 5.3 02/09/2022 0554   VLDL 27 02/09/2022 0554   LDLCALC 110 (H) 02/09/2022 0554  Risk Assessment/Calculations:   {Does this patient have ATRIAL FIBRILLATION?:[phone]}   Physical Exam:    VS:  There were no vitals taken for this visit.    Wt Readings from Last 3 Encounters:  12/18/23 253 lb 8.5 oz (115 kg)  11/12/23 255 lb 9.6 oz (115.9 kg)  10/17/23 255 lb (115.7 kg)     GEN: *** Well nourished, well developed in no acute distress HEENT: Normal NECK: No JVD; No carotid bruits LYMPHATICS: No lymphadenopathy CARDIAC: ***RRR, no murmurs, rubs, gallops RESPIRATORY:  Clear to auscultation without rales, wheezing or rhonchi  ABDOMEN: Soft, non-tender, non-distended MUSCULOSKELETAL:  No edema; No deformity  SKIN: Warm and  dry NEUROLOGIC:  Alert and oriented x 3 PSYCHIATRIC:  Normal affect   ASSESSMENT:    No diagnosis found. PLAN:    In order of problems listed above:  ***  Disposition: Follow up {follow up:15908} with ***   Shared Decision Making/Informed Consent   {Are you ordering a CV Procedure (e.g. stress test, cath, DCCV, TEE, etc)?   Press F2        :[id number]}    Signed, Joud Pettinato Ardelle Lesches  01/11/2024 7:49 AM    [facility]

## 2024-01-25 ENCOUNTER — Other Ambulatory Visit: Payer: Self-pay

## 2024-01-25 ENCOUNTER — Emergency Department
Admission: EM | Admit: 2024-01-25 | Discharge: 2024-01-25 | Discharge status: Against Medical Advice | Payer: Self-pay | Attending: Emergency Medicine | Admitting: Emergency Medicine

## 2024-01-25 DIAGNOSIS — R519 Headache, unspecified: Secondary | ICD-10-CM | POA: Insufficient documentation

## 2024-01-25 DIAGNOSIS — M7918 Myalgia, other site: Secondary | ICD-10-CM | POA: Insufficient documentation

## 2024-01-25 DIAGNOSIS — Z20822 Contact with and (suspected) exposure to covid-19: Secondary | ICD-10-CM | POA: Insufficient documentation

## 2024-01-25 DIAGNOSIS — Z5321 Procedure and treatment not carried out due to patient leaving prior to being seen by health care provider: Secondary | ICD-10-CM | POA: Insufficient documentation

## 2024-01-25 LAB — BASIC METABOLIC PANEL
Anion gap: 10 (ref 5–15)
BUN: 11 mg/dL (ref 6–20)
CO2: 23 mmol/L (ref 22–32)
Calcium: 8.9 mg/dL (ref 8.9–10.3)
Chloride: 103 mmol/L (ref 98–111)
Creatinine, Ser: 0.98 mg/dL (ref 0.61–1.24)
GFR, Estimated: >60 mL/min (ref >60)
Glucose, Bld: 181 mg/dL — ABNORMAL HIGH (ref 70–99)
Potassium: 3.8 mmol/L (ref 3.5–5.1)
Sodium: 136 mmol/L (ref 135–145)

## 2024-01-25 LAB — CBC
HCT: 39 % (ref 39.0–52.0)
Hemoglobin: 12.2 g/dL — ABNORMAL LOW (ref 13.0–17.0)
MCH: 24.7 pg — ABNORMAL LOW (ref 26.0–34.0)
MCHC: 31.3 g/dL (ref 30.0–36.0)
MCV: 79.1 fL — ABNORMAL LOW (ref 80.0–100.0)
Platelets: 205 10*3/uL (ref 150–400)
RBC: 4.93 MIL/uL (ref 4.22–5.81)
RDW: 12.5 % (ref 11.5–15.5)
WBC: 6.2 10*3/uL (ref 4.0–10.5)
nRBC: 0 % (ref 0.0–0.2)

## 2024-01-25 LAB — RESP PANEL BY RT-PCR (RSV, FLU A&B, COVID)  RVPGX2
Influenza A by PCR: NEGATIVE
Influenza B by PCR: NEGATIVE
Resp Syncytial Virus by PCR: NEGATIVE
SARS Coronavirus 2 by RT PCR: NEGATIVE

## 2024-01-25 LAB — CBG MONITORING, ED: Glucose-Capillary: 147 mg/dL — ABNORMAL HIGH (ref 70–99)

## 2024-01-25 MED ORDER — IBUPROFEN 600 MG PO TABS
600.0000 mg | ORAL_TABLET | Freq: Once | ORAL | Status: AC
  Administered 2024-01-25: 600 mg via ORAL
  Filled 2024-01-25: qty 1

## 2024-01-25 MED ORDER — ACETAMINOPHEN 325 MG PO TABS: 650.0000 mg | ORAL_TABLET | Freq: Once | ORAL | Status: DC

## 2024-01-25 NOTE — ED Provider Triage Note (Signed)
 Emergency Medicine Provider Triage Evaluation Note  Roger Pennington , a 48 y.o. male  was evaluated in triage.  Pt complains of flu like sx, headache, no vomiting, unsure of fever, says achey all over.  Review of Systems  Positive:  Negative:   Physical Exam  BP (!) 173/104   Pulse 96   Temp 98.7 F (37.1 C) (Oral)   Resp 20   SpO2 98%  Gen:   Awake, no distress   Resp:  Normal effort  MSK:   Moves extremities without difficulty  Other:    Medical Decision Making  Medically screening exam initiated at 11:33 AM.  Appropriate orders placed.  Roger Pennington was informed that the remainder of the evaluation will be completed by another provider, this initial triage assessment does not replace that evaluation, and the importance of remaining in the ED until their evaluation is complete.     Roger Devere ORN, PA-C 01/25/24 1133

## 2024-01-25 NOTE — ED Triage Notes (Signed)
 Pt to ED via POV from home. Pt reports woke up this morning with head pressure, body aches and N/V.

## 2024-03-24 ENCOUNTER — Other Ambulatory Visit: Payer: Self-pay

## 2024-03-24 ENCOUNTER — Emergency Department: Payer: Self-pay

## 2024-03-24 ENCOUNTER — Emergency Department
Admission: EM | Admit: 2024-03-24 | Discharge: 2024-03-24 | Disposition: A | Discharge status: Home / Self Care | Payer: Self-pay | Attending: Emergency Medicine | Admitting: Emergency Medicine

## 2024-03-24 ENCOUNTER — Encounter: Payer: Self-pay | Admitting: Intensive Care

## 2024-03-24 DIAGNOSIS — M79602 Pain in left arm: Secondary | ICD-10-CM | POA: Insufficient documentation

## 2024-03-24 DIAGNOSIS — E119 Type 2 diabetes mellitus without complications: Secondary | ICD-10-CM | POA: Insufficient documentation

## 2024-03-24 DIAGNOSIS — R200 Anesthesia of skin: Secondary | ICD-10-CM | POA: Insufficient documentation

## 2024-03-24 DIAGNOSIS — I10 Essential (primary) hypertension: Secondary | ICD-10-CM | POA: Diagnosis not present

## 2024-03-24 LAB — COMPREHENSIVE METABOLIC PANEL WITH GFR
ALT: 19 U/L (ref 0–44)
AST: 14 U/L — ABNORMAL LOW (ref 15–41)
Albumin: 4.1 g/dL (ref 3.5–5.0)
Alkaline Phosphatase: 66 U/L (ref 38–126)
Anion gap: 9 (ref 5–15)
BUN: 6 mg/dL (ref 6–20)
CO2: 25 mmol/L (ref 22–32)
Calcium: 8.9 mg/dL (ref 8.9–10.3)
Chloride: 102 mmol/L (ref 98–111)
Creatinine, Ser: 1.05 mg/dL (ref 0.61–1.24)
GFR, Estimated: >60 mL/min (ref >60)
Glucose, Bld: 328 mg/dL — ABNORMAL HIGH (ref 70–99)
Potassium: 3.8 mmol/L (ref 3.5–5.1)
Sodium: 136 mmol/L (ref 135–145)
Total Bilirubin: 0.9 mg/dL (ref 0.0–1.2)
Total Protein: 7.1 g/dL (ref 6.5–8.1)

## 2024-03-24 LAB — DIFFERENTIAL
Abs Immature Granulocytes: 0 10*3/uL (ref 0.00–0.07)
Basophils Absolute: 0 10*3/uL (ref 0.0–0.1)
Basophils Relative: 1 %
Eosinophils Absolute: 0.2 10*3/uL (ref 0.0–0.5)
Eosinophils Relative: 5 %
Immature Granulocytes: 0 %
Lymphocytes Relative: 53 %
Lymphs Abs: 2 10*3/uL (ref 0.7–4.0)
Monocytes Absolute: 0.3 10*3/uL (ref 0.1–1.0)
Monocytes Relative: 7 %
Neutro Abs: 1.3 10*3/uL — ABNORMAL LOW (ref 1.7–7.7)
Neutrophils Relative %: 34 %

## 2024-03-24 LAB — CBC
HCT: 37.8 % — ABNORMAL LOW (ref 39.0–52.0)
Hemoglobin: 11.8 g/dL — ABNORMAL LOW (ref 13.0–17.0)
MCH: 25.2 pg — ABNORMAL LOW (ref 26.0–34.0)
MCHC: 31.2 g/dL (ref 30.0–36.0)
MCV: 80.6 fL (ref 80.0–100.0)
Platelets: 172 10*3/uL (ref 150–400)
RBC: 4.69 MIL/uL (ref 4.22–5.81)
RDW: 12.4 % (ref 11.5–15.5)
WBC: 3.8 10*3/uL — ABNORMAL LOW (ref 4.0–10.5)
nRBC: 0 % (ref 0.0–0.2)

## 2024-03-24 MED ORDER — SODIUM CHLORIDE 0.9 % IV BOLUS
1000.0000 mL | Freq: Once | INTRAVENOUS | Status: AC
  Administered 2024-03-24: 1000 mL via INTRAVENOUS

## 2024-03-24 MED ORDER — LIDOCAINE 5 % EX PTCH: 1.0000 | MEDICATED_PATCH | Freq: Two times a day (BID) | CUTANEOUS | 0 refills | Status: AC

## 2024-03-24 MED ORDER — OXYCODONE-ACETAMINOPHEN 5-325 MG PO TABS: 1.0000 | ORAL_TABLET | ORAL | 0 refills | Status: DC | PRN

## 2024-03-24 MED ORDER — LORAZEPAM 2 MG/ML IJ SOLN
1.0000 mg | INTRAMUSCULAR | Status: DC | PRN
  Administered 2024-03-24: 1 mg via INTRAVENOUS
  Filled 2024-03-24: qty 1

## 2024-03-24 NOTE — ED Triage Notes (Signed)
 Patient reports going to bed at midnight 03/24/24 and waking up around 0530 with left arm numbness/tingling/ little movement and blurry vision  History hypertension and diabetes.  Ambulated into ER with no issues walking.

## 2024-03-24 NOTE — ED Provider Notes (Signed)
 [facility] Provider Note    Event Date/Time   First MD Initiated Contact with Patient 03/24/24 1050     (approximate)   History   Chief Complaint Cerebrovascular Accident   HPI  Bluford Sedler is a 48 y.o. male with past medical history of hypertension and diabetes who presents to the ED complaining of numbness.  Patient reports that he has been dealing with intermittent numbness in his left arm and hand for the past 3 days.  He then woke up this morning with difficulty using his left hand, states he feels like he cannot close the hand and has weakness when bending his arm at the elbow.  He will occasionally have pain shooting down his left arm, does state that he has been dealing with pain in his neck "for a long time."  He states that his vision has been blurry in both eyes since waking up this morning, went to bed last night around midnight last night with no issues and strength or vision at that time.  He denies any problems with his speech, has not had any symptoms affecting his lower extremities or right upper extremity.  He does report similar symptoms in his left hand about a year and a half ago that subsequently resolved.     Physical Exam   Triage Vital Signs: ED Triage Vitals [03/24/24 0905]  Encounter Vitals Group     BP (!) 176/113     Systolic BP Percentile      Diastolic BP Percentile      Pulse Rate 75     Resp 18     Temp 98.4 F (36.9 C)     Temp Source Oral     SpO2 96 %     Weight 247 lb (112 kg)     Height 6' (1.829 m)     Head Circumference      Peak Flow      Pain Score 0     Pain Loc      Pain Education      Exclude from Growth Chart     Most recent vital signs: Vitals:   03/24/24 0905 03/24/24 1319  BP: (!) 176/113 (!) 177/108  Pulse: 75 65  Resp: 18 18  Temp: 98.4 F (36.9 C) 97.6 F (36.4 C)  SpO2: 96% 100%    Constitutional: Alert and oriented. Eyes: Conjunctivae are normal. Head: Atraumatic. Nose:  No congestion/rhinnorhea. Mouth/Throat: Mucous membranes are moist.  Neck: Midline and left-sided paraspinal tenderness to palpation. Cardiovascular: Normal rate, regular rhythm. Grossly normal heart sounds.  2+ radial pulses bilaterally. Respiratory: Normal respiratory effort.  No retractions. Lungs CTAB. Gastrointestinal: Soft and nontender. No distention. Musculoskeletal: No lower extremity tenderness nor edema.  Neurologic:  Normal speech and language.  4 out of 5 strength with grip on the left along with flexion and extension at the wrist, flexion and extension at the elbow.  5 out of 5 strength in right upper extremity and bilateral lower extremities.    ED Results / Procedures / Treatments   Labs (all labs ordered are listed, but only abnormal results are displayed) Labs Reviewed  CBC - Abnormal; Notable for the following components:      Result Value   WBC 3.8 (*)    Hemoglobin 11.8 (*)    HCT 37.8 (*)    MCH 25.2 (*)    All other components within normal limits  DIFFERENTIAL - Abnormal; Notable for the following components:  Neutro Abs 1.3 (*)    All other components within normal limits  COMPREHENSIVE METABOLIC PANEL WITH GFR - Abnormal; Notable for the following components:   Glucose, Bld 328 (*)    AST 14 (*)    All other components within normal limits  APTT  CBG MONITORING, ED     EKG  ED ECG REPORT I, Chesley Noon, the attending physician, personally viewed and interpreted this ECG.   Date: 03/24/2024  EKG Time: 9:13  Rate: 73  Rhythm: normal sinus rhythm  Axis: Normal  Intervals:none  ST&T Change: None  RADIOLOGY CT head reviewed and interpreted by me with no hemorrhage or midline shift.  PROCEDURES:  Critical Care performed: No  Procedures   MEDICATIONS ORDERED IN ED: Medications  sodium chloride 0.9 % bolus 1,000 mL (1,000 mLs Intravenous New Bag/Given 03/24/24 1132)     IMPRESSION / MDM / ASSESSMENT AND PLAN / ED COURSE  I reviewed  the triage vital signs and the nursing notes.                              48 y.o. male with past medical history of hypertension and diabetes who presents to the ED complaining of intermittent numbness and tingling in his left arm over the past 3 days, woke up this morning with weakness in the arm.  Patient's presentation is most consistent with acute presentation with potential threat to life or bodily function.  Differential diagnosis includes, but is not limited to, stroke, cervical myelopathy, peripheral neuropathy, anemia, electrolyte abnormality, AKI.  Patient nontoxic-appearing and in no acute distress, vital signs remarkable for hypertension but otherwise reassuring.  He has diminished strength in his left arm as well as diminished sensation, but also reports some pain with movement at the left elbow, wrist, and hand.  CT head is negative for acute process, we will check MRI brain to assess for stroke, I am also concerned for cervical myelopathy and will check MRI of his cervical spine.  Labs without significant anemia, leukocytosis, electrolyte abnormality, or AKI.  He is hyperglycemic but no evidence of DKA and LFTs are unremarkable.  If this does end up being a stroke, patient presented outside the window for acute intervention given last known well time of midnight last night and no findings concerning for LVO.  MRI brain is negative for stroke or other acute process, MRI of cervical spine without significant spinal stenosis or foraminal stenosis on the left to explain his symptoms.  On reassessment, strength in his left arm seems improved and more limited secondary to pain.  No signs of infection on exam and he has strong peripheral pulses with cap refill less than 2 seconds in the digits of his left hand.  Given unremarkable workup, suspect peripheral neuropathy and patient appropriate for discharge home with outpatient neurology follow-up.  He will be prescribed a small amount of pain  medication, was counseled to follow-up with his PCP as well for uncontrolled hypertension, but no evidence of hypertensive emergency at this time.  He states he has plenty of blood pressure medication available at home, just has not taken it in a couple of days.  He was counseled to return to the ED for new or worsening symptoms, patient agrees with plan.      FINAL CLINICAL IMPRESSION(S) / ED DIAGNOSES   Final diagnoses:  Left arm numbness  Left arm pain  Uncontrolled hypertension     Rx /  DC Orders   ED Discharge Orders          Ordered    oxyCODONE-acetaminophen (PERCOCET) 5-325 MG tablet  Every 4 hours PRN        03/24/24 1353    lidocaine (LIDODERM) 5 %  Every 12 hours        03/24/24 1353             Note:  This document was prepared using Dragon voice recognition software and may include unintentional dictation errors.   Chesley Noon, MD 03/24/24 [phone]

## 2024-03-24 NOTE — ED Notes (Signed)
 Patient transported to MRI

## 2024-04-03 ENCOUNTER — Emergency Department
Admission: EM | Admit: 2024-04-03 | Discharge: 2024-04-04 | Disposition: A | Discharge status: Home / Self Care | Attending: Emergency Medicine | Admitting: Emergency Medicine

## 2024-04-03 ENCOUNTER — Emergency Department

## 2024-04-03 ENCOUNTER — Other Ambulatory Visit: Payer: Self-pay

## 2024-04-03 DIAGNOSIS — R55 Syncope and collapse: Secondary | ICD-10-CM

## 2024-04-03 DIAGNOSIS — E119 Type 2 diabetes mellitus without complications: Secondary | ICD-10-CM | POA: Insufficient documentation

## 2024-04-03 DIAGNOSIS — I10 Essential (primary) hypertension: Secondary | ICD-10-CM | POA: Diagnosis not present

## 2024-04-03 DIAGNOSIS — R4182 Altered mental status, unspecified: Secondary | ICD-10-CM | POA: Insufficient documentation

## 2024-04-03 DIAGNOSIS — R519 Headache, unspecified: Secondary | ICD-10-CM | POA: Insufficient documentation

## 2024-04-03 DIAGNOSIS — R404 Transient alteration of awareness: Secondary | ICD-10-CM

## 2024-04-03 LAB — COMPREHENSIVE METABOLIC PANEL WITH GFR
ALT: 16 U/L (ref 0–44)
AST: 18 U/L (ref 15–41)
Albumin: 4.2 g/dL (ref 3.5–5.0)
Alkaline Phosphatase: 62 U/L (ref 38–126)
Anion gap: 9 (ref 5–15)
BUN: 10 mg/dL (ref 6–20)
CO2: 23 mmol/L (ref 22–32)
Calcium: 8.6 mg/dL — ABNORMAL LOW (ref 8.9–10.3)
Chloride: 104 mmol/L (ref 98–111)
Creatinine, Ser: 0.84 mg/dL (ref 0.61–1.24)
GFR, Estimated: >60 mL/min (ref >60)
Glucose, Bld: 151 mg/dL — ABNORMAL HIGH (ref 70–99)
Potassium: 2.9 mmol/L — ABNORMAL LOW (ref 3.5–5.1)
Sodium: 136 mmol/L (ref 135–145)
Total Bilirubin: 0.9 mg/dL (ref 0.0–1.2)
Total Protein: 7.6 g/dL (ref 6.5–8.1)

## 2024-04-03 LAB — CBC WITH DIFFERENTIAL/PLATELET
Abs Immature Granulocytes: 0.01 10*3/uL (ref 0.00–0.07)
Basophils Absolute: 0 10*3/uL (ref 0.0–0.1)
Basophils Relative: 0 %
Eosinophils Absolute: 0.1 10*3/uL (ref 0.0–0.5)
Eosinophils Relative: 1 %
HCT: 37.3 % — ABNORMAL LOW (ref 39.0–52.0)
Hemoglobin: 11.6 g/dL — ABNORMAL LOW (ref 13.0–17.0)
Immature Granulocytes: 0 %
Lymphocytes Relative: 25 %
Lymphs Abs: 1.9 10*3/uL (ref 0.7–4.0)
MCH: 25.1 pg — ABNORMAL LOW (ref 26.0–34.0)
MCHC: 31.1 g/dL (ref 30.0–36.0)
MCV: 80.6 fL (ref 80.0–100.0)
Monocytes Absolute: 0.4 10*3/uL (ref 0.1–1.0)
Monocytes Relative: 6 %
Neutro Abs: 5.2 10*3/uL (ref 1.7–7.7)
Neutrophils Relative %: 68 %
Platelets: 173 10*3/uL (ref 150–400)
RBC: 4.63 MIL/uL (ref 4.22–5.81)
RDW: 12.3 % (ref 11.5–15.5)
WBC: 7.7 10*3/uL (ref 4.0–10.5)
nRBC: 0 % (ref 0.0–0.2)

## 2024-04-03 LAB — ACETAMINOPHEN LEVEL: Acetaminophen (Tylenol), Serum: <10 ug/mL — ABNORMAL LOW (ref 10–30)

## 2024-04-03 LAB — AMMONIA: Ammonia: 28 umol/L (ref 9–35)

## 2024-04-03 LAB — CBG MONITORING, ED: Glucose-Capillary: 154 mg/dL — ABNORMAL HIGH (ref 70–99)

## 2024-04-03 LAB — TROPONIN I (HIGH SENSITIVITY): Troponin I (High Sensitivity): 9 ng/L (ref <18)

## 2024-04-03 LAB — ETHANOL: Alcohol, Ethyl (B): <10 mg/dL (ref <10)

## 2024-04-03 LAB — SALICYLATE LEVEL: Salicylate Lvl: <7 mg/dL — ABNORMAL LOW (ref 7.0–30.0)

## 2024-04-03 NOTE — ED Provider Notes (Signed)
 [facility] Provider Note    Event Date/Time   First MD Initiated Contact with Patient 04/03/24 2236     (approximate)   History   No chief complaint on file.   HPI  Roger Pennington is a 48 y.o. male with hypertension, diabetes who comes in with concerns for altered mental status.  Patient's wife called for altered mental status patient found down on the ground.  He reportedly had been having a headache earlier in the day.  He states that he took some gel capsules to help with the headache.  According to EMS patient initially unresponsive there was some concern of maybe some low sugar so tried some D10 with no effect.  Did give him Narcan and patient started having nausea vomiting and did wake up.  I reviewed the records were patient was seen on 4/7 with left arm numbness and had MRI that was negative for acute findings  Patient's family is now at bedside who deny that he took any medications.  They stated that they actually gave him gel capsules of Advil but there is no concerns for any opioids.  They state that there is migraines that run in the family and so it could have been a migraine.  He does state that his migraine started around 3 PM today.  Physical Exam   Triage Vital Signs: ED Triage Vitals  Encounter Vitals Group     BP      Systolic BP Percentile      Diastolic BP Percentile      Pulse      Resp      Temp      Temp src      SpO2      Weight      Height      Head Circumference      Peak Flow      Pain Score      Pain Loc      Pain Education      Exclude from Growth Chart     Most recent vital signs: There were no vitals filed for this visit.   General: Awake, no distress.  CV:  Good peripheral perfusion.  Resp:  Normal effort.  Abd:  No distention.  Other:  Patient squeezes with bilateral hands and moves bilateral toes.  Patient seems somewhat sleepy but is able to answer some brief questions.  Abdomen appears soft and  nontender   ED Results / Procedures / Treatments   Labs (all labs ordered are listed, but only abnormal results are displayed) Labs Reviewed  CBC WITH DIFFERENTIAL/PLATELET - Abnormal; Notable for the following components:      Result Value   Hemoglobin 11.6 (*)    HCT 37.3 (*)    MCH 25.1 (*)    All other components within normal limits  CBG MONITORING, ED - Abnormal; Notable for the following components:   Glucose-Capillary 154 (*)    All other components within normal limits  COMPREHENSIVE METABOLIC PANEL WITH GFR  SALICYLATE LEVEL  ACETAMINOPHEN LEVEL  URINE DRUG SCREEN, QUALITATIVE ([facility] ONLY)  ETHANOL  AMMONIA  BLOOD GAS, VENOUS  TROPONIN I (HIGH SENSITIVITY)     EKG  My interpretation of EKG:    RADIOLOGY I have reviewed the CT personally and interpreted and no ICH    PROCEDURES:  Critical Care performed: yes  .Critical Care  Performed by: Concha Se, MD Authorized by: Concha Se, MD   Critical  care provider statement:    Critical care time (minutes):  30   Critical care was necessary to treat or prevent imminent or life-threatening deterioration of the following conditions:  CNS failure or compromise   Critical care was time spent personally by me on the following activities:  Development of treatment plan with patient or surrogate, discussions with consultants, evaluation of patient's response to treatment, examination of patient, ordering and review of laboratory studies, ordering and review of radiographic studies, ordering and performing treatments and interventions, pulse oximetry, re-evaluation of patient's condition and review of old charts .1-3 Lead EKG Interpretation  Performed by: Lubertha Rush, MD Authorized by: Lubertha Rush, MD     Interpretation: normal     ECG rate:  60   ECG rate assessment: normal     Rhythm: sinus rhythm     Ectopy: none     Conduction: normal      MEDICATIONS ORDERED IN ED: Medications - No data to  display   IMPRESSION / MDM / ASSESSMENT AND PLAN / ED COURSE  I reviewed the triage vital signs and the nursing notes.   Patient's presentation is most consistent with severe exacerbation of chronic illness.   Patient comes in with concerns for unresponsiveness.  Patient was more responsive upon my initial evaluation.  He was able to follow some commands although seem very sleepy patient was taken for stat CT imaging to rule intracranial hemorrhage without any obvious evidence.  Blood work ordered evaluate for causes for altered mental status including hypercapnia, ammonia, alcohol, drugs.  11:25 PM Patient waking up more he is stable to follow commands alert and oriented x 2 but still pretty drowsy.  Stated that headache started at 3 PM therefore will get CT angio.  Patient be handed off to oncoming team.  Hold off on headache cocktails given I do not want to over sedate him  The patient is on the cardiac monitor to evaluate for evidence of arrhythmia and/or significant heart rate changes.      FINAL CLINICAL IMPRESSION(S) / ED DIAGNOSES   Final diagnoses:  Acute intractable headache, unspecified headache type     Rx / DC Orders   ED Discharge Orders     None        Note:  This document was prepared using Dragon voice recognition software and may include unintentional dictation errors.   Lubertha Rush, MD 04/03/24 [phone]

## 2024-04-03 NOTE — ED Provider Notes (Signed)
-----------------------------------------   11:35 PM on 04/03/2024 -----------------------------------------  Assuming care from Dr. Peggi Bowels.  In short, Roger Pennington is a 48 y.o. male with a chief complaint of unresponsiveness.  Refer to the original H&P for additional details.  The current plan of care is to follow up on remaining labs and reassess.  Unclear disposition plan at this point; initial presentation was very worrisome but patient seems to be improving.     Clinical Course as of 04/04/24 0408  Thu Apr 03, 2024  2338 Ammonia: 28 [CF]  Fri Apr 04, 2024  0002 Reassuring lab work without any specific abnormality other than a potassium being somewhat low at 2.9.  I ordered repletion both oral and IV but the patient is currently getting his CTA head/neck.  Urine drug screen still pending as well [CF]  0404 CT ANGIO HEAD NECK W WO CM I viewed and interpreted the patient's CTA head/neck and I see no evidence of obvious occlusion.  Radiology confirmed no evidence of LVO or other acute abnormality [CF]  0405 I reassessed the patient given his reassuring workup.  He has been resting Condition but that I cannot and is in no distress.  I explained that there is no obvious sign of an emergent medical condition, but I cannot explain what exactly happened to him.  I suggested that we admit him for further syncope evaluation.  However, he wants to go home and his partner is comfortable with that plan as well.  Of note, I looked him in the eye and asked him directly if he took anything such as other medications or drugs.  He said no and was able to maintain eye contact.  I then ask him if this was an attempt to kill himself and he looks legitimately surprised when I ask him he said definitely not.  I do not feel it is appropriate to keep him against his will or to order a psychiatry consult at this time and I feel he has the capacity to make his own decision regarding discharge.  I gave my usual and  customary return precautions and follow-up recommendations.  [CF]    Clinical Course User Index [CF] Lynnda Sas, MD    ED ECG REPORT I, Lynnda Sas, the attending physician, personally viewed and interpreted this ECG.  Date: 04/04/2024 EKG Time: 00: 29 Rate: 72 Rhythm: normal sinus rhythm QRS Axis: normal Intervals: normal ST/T Wave abnormalities: Non-specific ST segment / T-wave changes, but no clear evidence of acute ischemia. Narrative Interpretation: no definitive evidence of acute ischemia; does not meet STEMI criteria.   Medications  iohexol  (OMNIPAQUE ) 350 MG/ML injection 75 mL (75 mLs Intravenous Contrast Given 04/04/24 0009)  potassium chloride  SA (KLOR-CON  M) CR tablet 40 mEq (40 mEq Oral Given 04/04/24 0058)  potassium chloride  10 mEq in 100 mL IVPB (0 mEq Intravenous Stopped 04/04/24 0241)  acetaminophen  (TYLENOL ) tablet 1,000 mg (1,000 mg Oral Given 04/04/24 0125)     ED Discharge Orders          Ordered    Ambulatory Referral to Primary Care (Establish Care)        04/04/24 0407           Final diagnoses:  Acute intractable headache, unspecified headache type  Syncope and collapse  Transient alteration of awareness     Lynnda Sas, MD 04/04/24 [phone]

## 2024-04-03 NOTE — ED Triage Notes (Signed)
 Pt home and found unresponsive by daughter.  Pt was normal sitting in chair prior girlfriend leaving for work but then found on the ground by daughter 20 minutes later.  Pt is c/o 10/10 headache.

## 2024-04-04 ENCOUNTER — Other Ambulatory Visit: Payer: Self-pay

## 2024-04-04 ENCOUNTER — Encounter: Payer: Self-pay | Admitting: Emergency Medicine

## 2024-04-04 ENCOUNTER — Emergency Department

## 2024-04-04 LAB — URINE DRUG SCREEN, QUALITATIVE (ARMC ONLY)
Amphetamines, Ur Screen: NOT DETECTED
Barbiturates, Ur Screen: NOT DETECTED
Benzodiazepine, Ur Scrn: NOT DETECTED
Cannabinoid 50 Ng, Ur ~~LOC~~: POSITIVE — AB
Cocaine Metabolite,Ur ~~LOC~~: NOT DETECTED
MDMA (Ecstasy)Ur Screen: NOT DETECTED
Methadone Scn, Ur: NOT DETECTED
Opiate, Ur Screen: NOT DETECTED
Phencyclidine (PCP) Ur S: NOT DETECTED
Tricyclic, Ur Screen: NOT DETECTED

## 2024-04-04 LAB — BLOOD GAS, VENOUS
Acid-base deficit: 0.8 mmol/L (ref 0.0–2.0)
Bicarbonate: 25.4 mmol/L (ref 20.0–28.0)
O2 Saturation: 68 %
Patient temperature: 37
pCO2, Ven: 47 mmHg (ref 44–60)
pH, Ven: 7.34 (ref 7.25–7.43)
pO2, Ven: 38 mmHg (ref 32–45)

## 2024-04-04 LAB — CK: Total CK: 306 U/L (ref 49–397)

## 2024-04-04 LAB — MAGNESIUM: Magnesium: 1.8 mg/dL (ref 1.7–2.4)

## 2024-04-04 LAB — TROPONIN I (HIGH SENSITIVITY): Troponin I (High Sensitivity): 8 ng/L (ref <18)

## 2024-04-04 MED ORDER — ACETAMINOPHEN 500 MG PO TABS
1000.0000 mg | ORAL_TABLET | Freq: Once | ORAL | Status: AC
  Administered 2024-04-04: 1000 mg via ORAL
  Filled 2024-04-04: qty 2

## 2024-04-04 MED ORDER — POTASSIUM CHLORIDE CRYS ER 20 MEQ PO TBCR
40.0000 meq | EXTENDED_RELEASE_TABLET | Freq: Once | ORAL | Status: AC
  Administered 2024-04-04: 40 meq via ORAL
  Filled 2024-04-04: qty 2

## 2024-04-04 MED ORDER — POTASSIUM CHLORIDE 10 MEQ/100ML IV SOLN
10.0000 meq | Freq: Once | INTRAVENOUS | Status: AC
  Administered 2024-04-04: 10 meq via INTRAVENOUS
  Filled 2024-04-04: qty 100

## 2024-04-04 MED ORDER — IOHEXOL 350 MG/ML SOLN
75.0000 mL | Freq: Once | INTRAVENOUS | Status: AC | PRN
  Administered 2024-04-04: 75 mL via INTRAVENOUS

## 2024-04-04 NOTE — Discharge Instructions (Signed)
 Your workup in the Emergency Department today was reassuring.  We did not find any specific abnormalities.  Given the severity of your episode, we offered admission to the hospital, but you declined in favor of outpatient follow-up.  We recommend you drink plenty of fluids, take your regular medications and/or any new ones prescribed today, and follow up with the doctor(s) listed in these documents as recommended.  Return to the Emergency Department if you develop new or worsening symptoms that concern you.

## 2024-04-04 NOTE — ED Notes (Signed)
 Pt ambulatory, VSS, accompanied by girlfriend.

## 2024-11-04 ENCOUNTER — Emergency Department

## 2024-11-04 ENCOUNTER — Emergency Department
Admission: EM | Admit: 2024-11-04 | Discharge: 2024-11-04 | Disposition: A | Discharge status: Home / Self Care | Attending: Emergency Medicine | Admitting: Emergency Medicine

## 2024-11-04 ENCOUNTER — Other Ambulatory Visit: Payer: Self-pay

## 2024-11-04 DIAGNOSIS — I10 Essential (primary) hypertension: Secondary | ICD-10-CM

## 2024-11-04 DIAGNOSIS — G43109 Migraine with aura, not intractable, without status migrainosus: Secondary | ICD-10-CM | POA: Diagnosis not present

## 2024-11-04 DIAGNOSIS — E119 Type 2 diabetes mellitus without complications: Secondary | ICD-10-CM | POA: Diagnosis not present

## 2024-11-04 LAB — COMPREHENSIVE METABOLIC PANEL WITH GFR
ALT: 33 U/L (ref 0–44)
AST: 28 U/L (ref 15–41)
Albumin: 4.6 g/dL (ref 3.5–5.0)
Alkaline Phosphatase: 83 U/L (ref 38–126)
Anion gap: 10 (ref 5–15)
BUN: 9 mg/dL (ref 6–20)
CO2: 24 mmol/L (ref 22–32)
Calcium: 9.3 mg/dL (ref 8.9–10.3)
Chloride: 104 mmol/L (ref 98–111)
Creatinine, Ser: 1.08 mg/dL (ref 0.61–1.24)
GFR, Estimated: >60 mL/min (ref >60)
Glucose, Bld: 214 mg/dL — ABNORMAL HIGH (ref 70–99)
Potassium: 3.7 mmol/L (ref 3.5–5.1)
Sodium: 138 mmol/L (ref 135–145)
Total Bilirubin: 0.4 mg/dL (ref 0.0–1.2)
Total Protein: 7.8 g/dL (ref 6.5–8.1)

## 2024-11-04 LAB — CBC
HCT: 41.5 % (ref 39.0–52.0)
Hemoglobin: 12.8 g/dL — ABNORMAL LOW (ref 13.0–17.0)
MCH: 24.5 pg — ABNORMAL LOW (ref 26.0–34.0)
MCHC: 30.8 g/dL (ref 30.0–36.0)
MCV: 79.5 fL — ABNORMAL LOW (ref 80.0–100.0)
Platelets: 212 K/uL (ref 150–400)
RBC: 5.22 MIL/uL (ref 4.22–5.81)
RDW: 12.2 % (ref 11.5–15.5)
WBC: 6.1 K/uL (ref 4.0–10.5)
nRBC: 0 % (ref 0.0–0.2)

## 2024-11-04 LAB — PROTIME-INR
INR: 1 (ref 0.8–1.2)
Prothrombin Time: 13.2 s (ref 11.4–15.2)

## 2024-11-04 LAB — DIFFERENTIAL
Abs Immature Granulocytes: 0.01 K/uL (ref 0.00–0.07)
Basophils Absolute: 0 K/uL (ref 0.0–0.1)
Basophils Relative: 1 %
Eosinophils Absolute: 0.2 K/uL (ref 0.0–0.5)
Eosinophils Relative: 3 %
Immature Granulocytes: 0 %
Lymphocytes Relative: 54 %
Lymphs Abs: 3.3 K/uL (ref 0.7–4.0)
Monocytes Absolute: 0.3 K/uL (ref 0.1–1.0)
Monocytes Relative: 5 %
Neutro Abs: 2.3 K/uL (ref 1.7–7.7)
Neutrophils Relative %: 37 %

## 2024-11-04 LAB — ETHANOL: Alcohol, Ethyl (B): <15 mg/dL (ref <15)

## 2024-11-04 LAB — APTT: aPTT: 28 s (ref 24–36)

## 2024-11-04 MED ORDER — FENTANYL CITRATE (PF) 50 MCG/ML IJ SOSY
50.0000 ug | PREFILLED_SYRINGE | Freq: Once | INTRAMUSCULAR | Status: AC
  Administered 2024-11-04: 50 ug via INTRAVENOUS
  Filled 2024-11-04: qty 1

## 2024-11-04 MED ORDER — IOHEXOL 350 MG/ML SOLN
75.0000 mL | Freq: Once | INTRAVENOUS | Status: AC | PRN
  Administered 2024-11-04: 75 mL via INTRAVENOUS

## 2024-11-04 MED ORDER — DIPHENHYDRAMINE HCL 50 MG/ML IJ SOLN
50.0000 mg | Freq: Once | INTRAMUSCULAR | Status: AC
  Administered 2024-11-04: 50 mg via INTRAVENOUS
  Filled 2024-11-04: qty 1

## 2024-11-04 MED ORDER — OXYCODONE-ACETAMINOPHEN 5-325 MG PO TABS
1.0000 | ORAL_TABLET | ORAL | Status: DC | PRN
  Administered 2024-11-04: 1 via ORAL
  Filled 2024-11-04: qty 1

## 2024-11-04 MED ORDER — SODIUM CHLORIDE 0.9% FLUSH
3.0000 mL | Freq: Once | INTRAVENOUS | Status: AC
  Administered 2024-11-04: 3 mL via INTRAVENOUS

## 2024-11-04 MED ORDER — BUTALBITAL-APAP-CAFFEINE 50-325-40 MG PO TABS: 1.0000 | ORAL_TABLET | Freq: Four times a day (QID) | ORAL | 0 refills | Status: AC | PRN

## 2024-11-04 MED ORDER — PROCHLORPERAZINE EDISYLATE 10 MG/2ML IJ SOLN
10.0000 mg | Freq: Once | INTRAMUSCULAR | Status: AC
  Administered 2024-11-04: 10 mg via INTRAVENOUS
  Filled 2024-11-04: qty 2

## 2024-11-04 MED ORDER — SODIUM CHLORIDE 0.9 % IV BOLUS
1000.0000 mL | Freq: Once | INTRAVENOUS | Status: AC
  Administered 2024-11-04: 1000 mL via INTRAVENOUS

## 2024-11-04 NOTE — ED Triage Notes (Signed)
 Pt arrives from work after going to see the nurse because their right arm is numb and tingling in their fingertips that started after lunch. Pt reports that at work BP was 200/118, in triage it is 174/118 (133). Pt states that they have a HA that started yesterday and the pain is going down their neck. Pt is A&Ox4 and ambulatory during triage.

## 2024-11-04 NOTE — Discharge Instructions (Signed)
 Please take your medication if needed for headache, as written.  Please call the number provided for neurology to arrange a follow-up appointment as soon as possible.  Return to the emergency department for any weakness or numbness of any arm or leg confusion slurred speech or any other symptom personally concerning to yourself.

## 2024-11-04 NOTE — Progress Notes (Signed)
   11/04/24 1600  Spiritual Encounters  Type of Visit Attempt (pt unavailable)  Care provided to: [facility] partners present during encounter Nurse  Reason for visit Code  Spiritual Framework  Presenting Themes Goals in life/care  Community/Connection Family;Friend(s);Significant other  Patient Stress Factors Health changes   Offered care to patient family while patient was being evaluated.

## 2024-11-04 NOTE — ED Notes (Addendum)
 Pt reports right arm numbness with decreased sensation LKW 1200. Reports difficulty walking and severe h/a

## 2024-11-04 NOTE — ED Notes (Signed)
 Code  stroke  called to [facility]  at  3:40pm

## 2024-11-04 NOTE — ED Notes (Signed)
 Pt discharged to home, instructions and medications reviewed.  Pt verbalized understanding, no questions at this time.

## 2024-11-04 NOTE — ED Provider Notes (Signed)
 [facility] Provider Note    Event Date/Time   First MD Initiated Contact with Patient 11/04/24 1604     (approximate)  History   Chief Complaint: Hypertension  HPI  Cliff Damiani is a 48 y.o. male with a past med history of diabetes, hypertension, presents to the emergency department for severe onset of a headache with right arm symptoms.  According to the patient shortly after lunch today he developed a severe headache.  Patient states he has been getting daily headaches for quite some time usually started around 4 PM they have been severe, 1 time causing him to pass out per patient.  Patient states that headache today has been his most severe yet.  Patient does describe a aura sensation prior to these headaches beginning he describes seeing a change in his vision and then he noticed that the headache will be coming.  Patient has never been formally diagnosed with migraine headaches.  Patient found to be hypertensive at work 200/118.  Currently 174/118 in the emergency department.  Patient states he was having a tingling sensation in his right hand which has now largely resolved.  However still has some increased sensation he states to the right face and arm.  Patient has sensitivity to light and noise, somewhat nauseated.  Physical Exam   Triage Vital Signs: ED Triage Vitals  Encounter Vitals Group     BP 11/04/24 1522 (!) 174/118     Girls Systolic BP Percentile --      Girls Diastolic BP Percentile --      Boys Systolic BP Percentile --      Boys Diastolic BP Percentile --      Pulse Rate 11/04/24 1522 81     Resp 11/04/24 1522 18     Temp 11/04/24 1522 98.3 F (36.8 C)     Temp Source 11/04/24 1522 Oral     SpO2 11/04/24 1522 97 %     Weight 11/04/24 1524 255 lb (115.7 kg)     Height 11/04/24 1524 6' (1.829 m)     Head Circumference --      Peak Flow --      Pain Score 11/04/24 1523 10     Pain Loc --      Pain Education --      Exclude from  Growth Chart --     Most recent vital signs: Vitals:   11/04/24 1522  BP: (!) 174/118  Pulse: 81  Resp: 18  Temp: 98.3 F (36.8 C)  SpO2: 97%    General: Awake.  Keeps his eyes closed through most of the exam, sensitivity to light.  Extraocular's appear intact. CV:  Good peripheral perfusion.  Regular rate and rhythm  Resp:  Normal effort.  Equal breath sounds bilaterally.  Abd:  No distention.  Soft, nontender.  No rebound or guarding. Other:  Neuroexam shows some hypersensitivity in the right upper extremity and right face no numbness no weakness, cranial nerves appear intact although somewhat limited eye exam given light sensitivity.   ED Results / Procedures / Treatments   EKG  EKG viewed and interpreted by myself shows a normal sinus rhythm at 76 bpm with a narrow QRS, normal axis, normal intervals, no concerning ST changes.  RADIOLOGY  I reviewed interpreted CT head images.  No obvious bleed seen on my evaluation. Radiology has read the CT scan as negative for acute stroke, aspects is 10  MEDICATIONS ORDERED IN ED: Medications  oxyCODONE -acetaminophen  (  PERCOCET/ROXICET) 5-325 MG per tablet 1 tablet (1 tablet Oral Given 11/04/24 1534)  diphenhydrAMINE  (BENADRYL ) injection 50 mg (has no administration in time range)  fentaNYL  (SUBLIMAZE ) injection 50 mcg (has no administration in time range)  sodium chloride  0.9 % bolus 1,000 mL (has no administration in time range)  sodium chloride  flush (NS) 0.9 % injection 3 mL (3 mLs Intravenous Given 11/04/24 1605)  prochlorperazine (COMPAZINE) injection 10 mg (10 mg Intravenous Given 11/04/24 1605)    IMPRESSION / MDM / ASSESSMENT AND PLAN / ED COURSE  I reviewed the triage vital signs and the nursing notes.  Patient's presentation is most consistent with acute presentation with potential threat to life or bodily function.  Patient presents emergency department with acute onset of severe headache.  Patient states nearly daily  headaches, however given the severity of this headache he was made a code stroke on arrival.  Neurology is seeing in conjunction with myself.  Initial CT scan does not appear to show any significant finding.  After a longer discussion with the patient is symptoms seem very suggestive of migraine with aura, possibly with more of a complicated migraine picture today given his initial right arm symptoms which have largely resolved.  Only finding on exam now as he states some increased sensation to the right arm and right face.  We will check labs we will obtain a CTA of the head and neck as a precaution.  Will treat the patient's headache with Compazine, Benadryl , fluids and a small dose of fentanyl  (avoiding Toradol  until the patient's clinical picture is more clear).  Patient's workup today shows a reassuring CBC, reassuring chemistry.  Ethanol is negative.  CT scan of the head negative for acute abnormality.  CTA negative for aneurysm.  After medications and fluids patient states his headache has now completely resolved.  Patient denies any neurologic symptoms.  We will obtain an MRI, if the MRI is negative I believe the patient could be discharged home with outpatient neurology follow-up.  Patient agreeable to plan.  MRI has resulted negative for acute abnormality.  Given the patient's reassuring workup I believe the patient could be discharged home with neurology follow-up.  Will send home with a prescription for Fioricet .  Patient agreeable to plan.   FINAL CLINICAL IMPRESSION(S) / ED DIAGNOSES   Migraine headache Complicated migraine   Note:  This document was prepared using Dragon voice recognition software and may include unintentional dictation errors.   Dorothyann Drivers, MD 11/04/24 2145

## 2024-11-04 NOTE — ED Notes (Signed)
 Pt reports resolution of headache, now pain free.  Initial NIH scored at 6, current score 1, still has some sensory deficit in R arm.

## 2024-11-04 NOTE — Consult Note (Addendum)
 NEUROLOGY CONSULT NOTE   Date of service: November 04, 2024 Patient Name: Roger Pennington MRN:  [id number] DOB:  02-14-1976 Chief Complaint: Severe left frontal and retroorbital headache with RUE numbness Requesting Provider: Dorothyann Drivers, MD  History of Present Illness  Roger Pennington is a 48 y.o. male with a PMHx of DM, tobacco use and HTN who presents to the ED with acute onset of severe thunderclap headache involving his left forehead and the back of his left eye. The headache is 10/10, nonthrobbing, and worsened by light. Eye movement worsens his eye pain. He is experiencing nausea with the headache. He also has accompanying mild RUE sensory numbness. At the onset of his headache, he saw shimmering wavy lines on the left side of his visual fields. His headache started while he was at work. He went to see the nurse at work due to right arm numbness with tingling of the fingertips of his right hand. The headache started after lunch.  At work his BP was 200/118, in triage it is 174/118, then increased to a SBP > 200 during neurological examination.    He states that he has chronic daily headaches that usually start each day at about 4 PM. His headaches usually occur along the right side of his head. This headache is unusual in that it is left sided and so severe that it feels like the worst headache of my life. It shares other characteristics of his recurrent headaches in that it coincided with shimmering wavy lines in his vision at the start of the pain, is associated with nausea and photophobia, and is unilateral. He has not previously had a formal headache diagnosis outpatient.   LKW: Noon Modified rankin score: 0 IV Thrombolysis/EVT: No: Presentation is most consistent with complicated migraine. A small SAH is also possible.    NIHSS components Score: Comment  1a Level of Conscious 0[x]  1[]  2[]  3[]      1b LOC Questions 0[x]  1[]  2[]       1c LOC Commands 0[x]  1[]  2[]       2  Best Gaze 0[x]  1[]  2[]       3 Visual 0[x]  1[]  2[]  3[]      4 Facial Palsy 0[x]  1[]  2[]  3[]      5a Motor Arm - left 0[x]  1[]  2[]  3[]  4[]  UN[]    5b Motor Arm - Right 0[x]  1[]  2[]  3[]  4[]  UN[]    6a Motor Leg - Left 0[x]  1[]  2[]  3[]  4[]  UN[]    6b Motor Leg - Right 0[x]  1[]  2[]  3[]  4[]  UN[]    7 Limb Ataxia 0[x]  1[]  2[]  UN[]      8 Sensory 0[]  1[x]  2[]  UN[]     Decreased FT to RUE  9 Best Language 0[x]  1[]  2[]  3[]      10 Dysarthria 0[x]  1[]  2[]  UN[]      11 Extinct. and Inattention 0[x]  1[]  2[]       TOTAL:   1      ROS  Comprehensive ROS performed and pertinent positives documented in HPI    Past History   Past Medical History:  Diagnosis Date   Diabetes mellitus without complication (HCC)    Hypertension    Tobacco abuse    Type 2 diabetes mellitus, with long-term current use of insulin  (HCC) 06/01/2021    Past Surgical History:  Procedure Laterality Date   ESOPHAGOGASTRODUODENOSCOPY (EGD) WITH PROPOFOL  N/A 02/09/2016   Procedure: ESOPHAGOGASTRODUODENOSCOPY (EGD) WITH PROPOFOL ;  Surgeon: Gladis RAYMOND Mariner, MD;  Location: [facility] ENDOSCOPY;  Service: Endoscopy;  Laterality: N/A;   INCISION AND DRAINAGE ABSCESS Right 05/25/2020   Procedure: INCISION AND DRAINAGE ABSCESS;  Surgeon: Jordis Laneta FALCON, MD;  Location: [facility] ORS;  Service: General;  Laterality: Right;    Family History: Family History  Problem Relation Age of Onset   Hypertension Father    Hypertension Brother     Social History  reports that he has been smoking cigarettes. He has never used smokeless tobacco. He reports current alcohol use. He reports current drug use. Drug: Marijuana.  No Known Allergies  Medications   Current Facility-Administered Medications:    diphenhydrAMINE  (BENADRYL ) injection 50 mg, 50 mg, Intravenous, Once, Dorothyann Drivers, MD   fentaNYL  (SUBLIMAZE ) injection 50 mcg, 50 mcg, Intravenous, Once, Dorothyann Drivers, MD   oxyCODONE -acetaminophen  (PERCOCET/ROXICET) 5-325 MG per tablet 1 tablet, 1  tablet, Oral, Q30 min PRN, Dorothyann Drivers, MD, 1 tablet at 11/04/24 1534   sodium chloride  0.9 % bolus 1,000 mL, 1,000 mL, Intravenous, Once, Dorothyann Drivers, MD  Current Outpatient Medications:    acetaminophen  (TYLENOL ) 500 MG tablet, Take 500 mg by mouth every 6 (six) hours as needed for moderate pain., Disp: , Rfl:    atorvastatin  (LIPITOR) 80 MG tablet, Take 1 tablet (80 mg total) by mouth daily., Disp: 30 tablet, Rfl: 1   carvedilol  (COREG ) 3.125 MG tablet, Take 1 tablet (3.125 mg total) by mouth 2 (two) times daily., Disp: 60 tablet, Rfl: 11   clotrimazole (LOTRIMIN) 1 % cream, Apply 1 application topically 2 (two) times daily. Apply to pt's feet, Disp: , Rfl:    famotidine  (PEPCID ) 10 MG tablet, Take 1 tablet (10 mg total) by mouth as needed for heartburn or indigestion., Disp: 30 tablet, Rfl: 1   insulin  glargine (LANTUS  SOLOSTAR) 100 UNIT/ML Solostar Pen, Inject 50 Units into the skin daily., Disp: 15 mL, Rfl: 1   Insulin  Pen Needle 32G X 4 MM MISC, 90 Units by Does not apply route 3 (three) times daily., Disp: 90 each, Rfl: 10   lidocaine  (LIDODERM ) 5 %, Place 1 patch onto the skin every 12 (twelve) hours. Remove & Discard patch within 12 hours or as directed by MD, Disp: 10 patch, Rfl: 0   lisinopril  (ZESTRIL ) 5 MG tablet, Take 1 tablet (5 mg total) by mouth daily., Disp: 30 tablet, Rfl: 11   losartan (COZAAR) 50 MG tablet, Take 50 mg by mouth daily., Disp: , Rfl:    metoprolol  tartrate (LOPRESSOR ) 100 MG tablet, Take 2 hours prior to Cardiac CT, Disp: 1 tablet, Rfl: 0   nicotine  (NICODERM CQ  - DOSED IN MG/24 HOURS) 21 mg/24hr patch, Place 1 patch (21 mg total) onto the skin daily., Disp: 28 patch, Rfl: 0   omeprazole  (PRILOSEC) 40 MG capsule, Take 1 capsule (40 mg total) by mouth daily., Disp: 90 capsule, Rfl: 1   oxyCODONE -acetaminophen  (PERCOCET) 5-325 MG tablet, Take 1 tablet by mouth every 4 (four) hours as needed., Disp: 12 tablet, Rfl: 0   TRADJENTA  5 MG TABS tablet, Take  5 mg by mouth daily., Disp: , Rfl:   Vitals   Vitals:   11/04/24 1522 11/04/24 1524  BP: (!) 174/118   Pulse: 81   Resp: 18   Temp: 98.3 F (36.8 C)   TempSrc: Oral   SpO2: 97%   Weight:  115.7 kg  Height:  6' (1.829 m)    Body mass index is 34.58 kg/m.   Physical Exam   Constitutional: Appears well-developed and well-nourished.  Psych: Pained affect  Eyes: No scleral injection.  HENT: No OP obstruction.  Head: Normocephalic.  Respiratory: Effort normal, non-labored breathing.  Skin: WDI.   Neurologic Examination   See NIHSS  Labs/Imaging/Neurodiagnostic studies   CBC:  Recent Labs  Lab 11/16/24 1532  WBC 6.1  NEUTROABS 2.3  HGB 12.8*  HCT 41.5  MCV 79.5*  PLT 212   Basic Metabolic Panel:  Lab Results  Component Value Date   NA 136 04/03/2024   K 2.9 (L) 04/03/2024   CO2 23 04/03/2024   GLUCOSE 151 (H) 04/03/2024   BUN 10 04/03/2024   CREATININE 0.84 04/03/2024   CALCIUM  8.6 (L) 04/03/2024   GFRNONAA >60 04/03/2024   GFRAA >60 09/02/2020   Lipid Panel:  Lab Results  Component Value Date   LDLCALC 110 (H) 02/09/2022   HgbA1c:  Lab Results  Component Value Date   HGBA1C 7.0 (H) 02/08/2022   Urine Drug Screen:     Component Value Date/Time   LABOPIA NONE DETECTED 04/03/2024 2302   LABOPIA NONE DETECTED 02/08/2022 0940   COCAINSCRNUR NONE DETECTED 04/03/2024 2302   LABBENZ NONE DETECTED 04/03/2024 2302   LABBENZ NONE DETECTED 02/08/2022 0940   AMPHETMU NONE DETECTED 04/03/2024 2302   AMPHETMU NONE DETECTED 02/08/2022 0940   THCU POSITIVE (A) 04/03/2024 2302   THCU POSITIVE (A) 02/08/2022 0940   LABBARB NONE DETECTED 04/03/2024 2302   LABBARB NONE DETECTED 02/08/2022 0940    Alcohol Level     Component Value Date/Time   ETH <10 04/03/2024 2302   INR  Lab Results  Component Value Date   INR 1.09 02/08/2016     ASSESSMENT  Roger Pennington is a 48 y.o. male presenting with symptoms and signs that are most consistent with a  complicated migraine.  - Exam with RUE numbness. NIHSS 1. Rapidly improving relative to initial Triage NIHSS of 6.  - CT head: No acute intracranial abnormality. Remote left parietal infarct. ASPECTS is 10. - Impression: Most likely component of the DDx for his presentation is complicated migraine. His severe HTN may be due to the pain, but an alternative explanation would be malignant HTN triggering a severe headache. Lower on the DDx but also possible would be a small SAH not visible on CT. His presentation with severe headache pain is not typical of acute stroke, but given his NIHSS of 6 on presentation with residual right arm numbness on attending exam, will need to rule out stroke with MRI.   RECOMMENDATIONS  - CTA of head and neck - MRI brain to rule out stroke.  - Aggressive BP management - Compazine 10 mg IV x 1 - Discussed assessment and recommendations with EDP  Addendum: - CTA of head and neck: No large vessel occlusion, hemodynamically significant stenosis, or aneurysm in the head or neck. - MRI brain: No acute intracranial abnormality. Remote left parietal cortical infarct. On personal review of the images by Neurology, the left parietal lesion also appears compatible with a remote traumatic cortical contusion.  - Headache is now resolved - Will need outpatient PCP and/or Neurology follow up for his recurrent migrainous headaches - Will need improved BP control outpatient - No strong indication for starting ASA given the MRI finding described above, which is felt more likely to represent an old cortical contusion ______________________________________________________________________    Bonney SHARK, Chirag Krueger, MD [facility]

## 2024-11-04 NOTE — Progress Notes (Signed)
 CODE STROKE- PHARMACY COMMUNICATION   Time CODE STROKE called/page received: 1540  Time response to CODE STROKE was made (in person or via phone): 1545  Time Stroke Kit retrieved from [employer] (only if needed):N/A  Name of Provider/Nurse contacted: [place]zen  Past Medical History:  Diagnosis Date   Diabetes mellitus without complication (HCC)    Hypertension    Tobacco abuse    Type 2 diabetes mellitus, with long-term current use of insulin  (HCC) 06/01/2021   Prior to Admission medications   Medication Sig Start Date End Date Taking? Authorizing Provider  acetaminophen  (TYLENOL ) 500 MG tablet Take 500 mg by mouth every 6 (six) hours as needed for moderate pain.    [provider]  atorvastatin  (LIPITOR) 80 MG tablet Take 1 tablet (80 mg total) by mouth daily. 12/18/23   Suzanne Kirsch, MD  carvedilol  (COREG ) 3.125 MG tablet Take 1 tablet (3.125 mg total) by mouth 2 (two) times daily. 12/18/23 03/17/24  Suzanne Kirsch, MD  clotrimazole (LOTRIMIN) 1 % cream Apply 1 application topically 2 (two) times daily. Apply to pt's feet 11/29/21   [provider]  famotidine  (PEPCID ) 10 MG tablet Take 1 tablet (10 mg total) by mouth as needed for heartburn or indigestion. 02/09/22   Krishnan, Sendil K, MD  insulin  glargine (LANTUS  SOLOSTAR) 100 UNIT/ML Solostar Pen Inject 50 Units into the skin daily. 12/18/23 02/16/24  Suzanne Kirsch, MD  Insulin  Pen Needle 32G X 4 MM MISC 90 Units by Does not apply route 3 (three) times daily. 12/18/23   Suzanne Kirsch, MD  lidocaine  (LIDODERM ) 5 % Place 1 patch onto the skin every 12 (twelve) hours. Remove & Discard patch within 12 hours or as directed by MD 03/24/24 03/24/25  Willo Dunnings, MD  lisinopril  (ZESTRIL ) 5 MG tablet Take 1 tablet (5 mg total) by mouth daily. 12/18/23 12/17/24  Suzanne Kirsch, MD  losartan (COZAAR) 50 MG tablet Take 50 mg by mouth daily. 08/13/23   [provider]  metoprolol  tartrate (LOPRESSOR ) 100 MG tablet Take 2  hours prior to Cardiac CT 04/04/22   Santo Kelly A, MD  nicotine  (NICODERM CQ  - DOSED IN MG/24 HOURS) 21 mg/24hr patch Place 1 patch (21 mg total) onto the skin daily. 02/10/22   Krishnan, Sendil K, MD  omeprazole  (PRILOSEC) 40 MG capsule Take 1 capsule (40 mg total) by mouth daily. 12/18/23 06/15/24  Suzanne Kirsch, MD  oxyCODONE -acetaminophen  (PERCOCET) 5-325 MG tablet Take 1 tablet by mouth every 4 (four) hours as needed. 03/24/24 03/24/25  Willo Dunnings, MD  TRADJENTA  5 MG TABS tablet Take 5 mg by mouth daily. 05/01/20   [provider]    Lum VEAR Mania ,PharmD Clinical Pharmacist  11/04/2024  4:58 PM

## 2025-01-21 ENCOUNTER — Other Ambulatory Visit: Payer: Self-pay

## 2025-01-21 ENCOUNTER — Encounter: Payer: Self-pay | Admitting: Emergency Medicine

## 2025-01-21 ENCOUNTER — Emergency Department
Admission: EM | Admit: 2025-01-21 | Discharge: 2025-01-21 | Discharge status: Against Medical Advice | Attending: Emergency Medicine | Admitting: Emergency Medicine

## 2025-01-21 DIAGNOSIS — L02416 Cutaneous abscess of left lower limb: Secondary | ICD-10-CM | POA: Insufficient documentation

## 2025-01-21 DIAGNOSIS — R7309 Other abnormal glucose: Secondary | ICD-10-CM | POA: Insufficient documentation

## 2025-01-21 DIAGNOSIS — Z5321 Procedure and treatment not carried out due to patient leaving prior to being seen by health care provider: Secondary | ICD-10-CM | POA: Insufficient documentation

## 2025-01-21 LAB — CBC
HCT: 40.8 % (ref 39.0–52.0)
Hemoglobin: 12.9 g/dL — ABNORMAL LOW (ref 13.0–17.0)
MCH: 24.9 pg — ABNORMAL LOW (ref 26.0–34.0)
MCHC: 31.6 g/dL (ref 30.0–36.0)
MCV: 78.6 fL — ABNORMAL LOW (ref 80.0–100.0)
Platelets: 180 10*3/uL (ref 150–400)
RBC: 5.19 MIL/uL (ref 4.22–5.81)
RDW: 12.8 % (ref 11.5–15.5)
WBC: 6.7 10*3/uL (ref 4.0–10.5)
nRBC: 0 % (ref 0.0–0.2)

## 2025-01-21 LAB — COMPREHENSIVE METABOLIC PANEL WITH GFR
ALT: 16 U/L (ref 0–44)
AST: 13 U/L — ABNORMAL LOW (ref 15–41)
Albumin: 4.4 g/dL (ref 3.5–5.0)
Alkaline Phosphatase: 108 U/L (ref 38–126)
Anion gap: 13 (ref 5–15)
BUN: 9 mg/dL (ref 6–20)
CO2: 24 mmol/L (ref 22–32)
Calcium: 9.2 mg/dL (ref 8.9–10.3)
Chloride: 99 mmol/L (ref 98–111)
Creatinine, Ser: 0.99 mg/dL (ref 0.61–1.24)
GFR, Estimated: >60 mL/min
Glucose, Bld: 532 mg/dL (ref 70–99)
Potassium: 4.2 mmol/L (ref 3.5–5.1)
Sodium: 136 mmol/L (ref 135–145)
Total Bilirubin: 0.4 mg/dL (ref 0.0–1.2)
Total Protein: 7.7 g/dL (ref 6.5–8.1)

## 2025-01-21 LAB — CBG MONITORING, ED: Glucose-Capillary: 499 mg/dL — ABNORMAL HIGH (ref 70–99)

## 2025-01-21 NOTE — ED Triage Notes (Addendum)
 Pt with boil to left inner thigh. Has been present about a week.  Getting larger. Not draining.  Warm to touch. No chills or fever.  Very painful.   Pt reports he has been out of his long acting insulin  for 3 days and requests blood sugar check.

## 2025-01-23 ENCOUNTER — Emergency Department
Admission: EM | Admit: 2025-01-23 | Discharge: 2025-01-23 | Disposition: A | Discharge status: Home / Self Care | Source: Home / Self Care

## 2025-01-23 ENCOUNTER — Emergency Department

## 2025-01-23 DIAGNOSIS — L03116 Cellulitis of left lower limb: Secondary | ICD-10-CM

## 2025-01-23 DIAGNOSIS — R739 Hyperglycemia, unspecified: Secondary | ICD-10-CM

## 2025-01-23 LAB — CBC WITH DIFFERENTIAL/PLATELET
Abs Immature Granulocytes: 0.01 10*3/uL (ref 0.00–0.07)
Basophils Absolute: 0 10*3/uL (ref 0.0–0.1)
Basophils Relative: 1 %
Eosinophils Absolute: 0.1 10*3/uL (ref 0.0–0.5)
Eosinophils Relative: 2 %
HCT: 41.4 % (ref 39.0–52.0)
Hemoglobin: 13 g/dL (ref 13.0–17.0)
Immature Granulocytes: 0 %
Lymphocytes Relative: 45 %
Lymphs Abs: 2.5 10*3/uL (ref 0.7–4.0)
MCH: 24.9 pg — ABNORMAL LOW (ref 26.0–34.0)
MCHC: 31.4 g/dL (ref 30.0–36.0)
MCV: 79.2 fL — ABNORMAL LOW (ref 80.0–100.0)
Monocytes Absolute: 0.3 10*3/uL (ref 0.1–1.0)
Monocytes Relative: 6 %
Neutro Abs: 2.6 10*3/uL (ref 1.7–7.7)
Neutrophils Relative %: 46 %
Platelets: 176 10*3/uL (ref 150–400)
RBC: 5.23 MIL/uL (ref 4.22–5.81)
RDW: 12.7 % (ref 11.5–15.5)
WBC: 5.5 10*3/uL (ref 4.0–10.5)
nRBC: 0 % (ref 0.0–0.2)

## 2025-01-23 LAB — BASIC METABOLIC PANEL WITH GFR
Anion gap: 11 (ref 5–15)
BUN: 9 mg/dL (ref 6–20)
CO2: 27 mmol/L (ref 22–32)
Calcium: 9.5 mg/dL (ref 8.9–10.3)
Chloride: 100 mmol/L (ref 98–111)
Creatinine, Ser: 1.02 mg/dL (ref 0.61–1.24)
GFR, Estimated: >60 mL/min
Glucose, Bld: 449 mg/dL — ABNORMAL HIGH (ref 70–99)
Potassium: 4.2 mmol/L (ref 3.5–5.1)
Sodium: 137 mmol/L (ref 135–145)

## 2025-01-23 LAB — CBG MONITORING, ED: Glucose-Capillary: 227 mg/dL — ABNORMAL HIGH (ref 70–99)

## 2025-01-23 MED ORDER — SODIUM CHLORIDE 0.9 % IV BOLUS
1000.0000 mL | Freq: Once | INTRAVENOUS | Status: AC
  Administered 2025-01-23: 1000 mL via INTRAVENOUS

## 2025-01-23 MED ORDER — CEPHALEXIN 500 MG PO CAPS: 500.0000 mg | ORAL_CAPSULE | Freq: Four times a day (QID) | ORAL | 0 refills | Status: AC

## 2025-01-23 MED ORDER — IOHEXOL 300 MG/ML  SOLN
100.0000 mL | Freq: Once | INTRAMUSCULAR | Status: AC | PRN
  Administered 2025-01-23: 100 mL via INTRAVENOUS

## 2025-01-23 MED ORDER — INSULIN ASPART 100 UNIT/ML IJ SOLN
10.0000 [IU] | Freq: Once | INTRAMUSCULAR | Status: AC
  Administered 2025-01-23: 10 [IU] via SUBCUTANEOUS
  Filled 2025-01-23: qty 10

## 2025-01-23 MED ORDER — DOXYCYCLINE MONOHYDRATE 100 MG PO TABS: 100.0000 mg | ORAL_TABLET | Freq: Two times a day (BID) | ORAL | 0 refills | Status: AC

## 2025-01-23 MED ORDER — SODIUM CHLORIDE 0.9 % IV SOLN
2.0000 g | INTRAVENOUS | Status: DC
  Administered 2025-01-23: 2 g via INTRAVENOUS
  Filled 2025-01-23: qty 20

## 2025-01-23 NOTE — ED Provider Notes (Signed)
 "  [facility] Provider Note    Event Date/Time   First MD Initiated Contact with Patient 01/23/25 0915     (approximate)   History   Abscess   HPI  Roger Pennington is a 49 y.o. male with a past medical history of heart failure, diabetes, hypertension who presents today for evaluation of boil to his left inner thigh.  Patient reports that this has been there for 1 week.  He reports that it came to ahead yesterday and ruptured overnight.  It is no longer actively draining.  No fevers.  He denies any involvement of his perineum or scrotum.  No fevers or chills.  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   Abscess    Perineal abscess and cellulitis 05/24/2020   Sepsis (HCC) 05/24/2020   Diabetes mellitus without complication (HCC) 05/24/2020   Atypical chest pain 05/24/2020   Lower abdominal pain 05/24/2020   Severe recurrent major depression without psychotic features (HCC) 06/05/2019   Suicide attempt (HCC) 06/05/2019   Essential hypertension 06/05/2019   Social anxiety disorder 06/05/2019   Intentional acetaminophen  overdose (HCC) 06/04/2019   Depression with suicidal ideation 06/04/2019   Severe recurrent major depression (HCC) 07/02/2017   Tobacco abuse 07/02/2017   Cannabis use disorder, severe, dependence (HCC) 07/02/2017   Upper GI bleed 02/08/2016          Physical Exam   Triage Vital Signs: ED Triage Vitals [01/23/25 0845]  Encounter Vitals Group     BP (!) 163/105     Girls Systolic BP Percentile      Girls Diastolic BP Percentile      Boys Systolic BP Percentile      Boys Diastolic BP Percentile      Pulse Rate 87     Resp 18     Temp 97.7 F (36.5 C)     Temp src      SpO2 97 %     Weight      Height      Head  Circumference      Peak Flow      Pain Score 10     Pain Loc      Pain Education      Exclude from Growth Chart     Most recent vital signs: Vitals:   01/23/25 1442 01/23/25 1443  BP: (!) 157/106 (!) 157/106  Pulse: 72 72  Resp:  18  Temp:    SpO2: 100%     Physical Exam Vitals and nursing note reviewed.  Constitutional:      General: Awake and alert. No acute distress.    Appearance: Normal appearance. The patient is normal weight.  HENT:     Head: Normocephalic and atraumatic.     Mouth: Mucous membranes are moist.  Eyes:     General: PERRL. Normal EOMs        Right eye: No discharge.        Left eye: No discharge.     Conjunctiva/sclera: Conjunctivae normal.  Cardiovascular:     Rate and Rhythm: Normal rate and regular rhythm.     Pulses: Normal pulses.  Pulmonary:     Effort: Pulmonary effort is normal. No respiratory distress.     Breath sounds: Normal breath sounds.  Abdominal:  Abdomen is soft. There is no abdominal tenderness. No rebound or guarding. No distention. Musculoskeletal:        General: No swelling. Normal range of motion.     Cervical back: Normal range of motion and neck supple.  Left upper thigh with 1 x 1 cm opening without active drainage.  Indurated tissue surrounding the ulceration approximately 3 x 3 cm, though no lymphangitis.  Mild erythema extending more distally, approximately 12 cm from the ulceration.  Nontender in this location, only in the 3 cm area of induration.  No crepitus.  Normal perineum and scrotum. Skin:    General: Skin is warm and dry.     Capillary Refill: Capillary refill takes less than 2 seconds.     Findings: No rash.  Neurological:     Mental Status: The patient is awake and alert.      ED Results / Procedures / Treatments   Labs (all labs ordered are listed, but only abnormal results are displayed) Labs Reviewed  BASIC METABOLIC PANEL WITH GFR - Abnormal; Notable for the following components:      Result  Value   Glucose, Bld 449 (*)    All other components within normal limits  CBC WITH DIFFERENTIAL/PLATELET - Abnormal; Notable for the following components:   MCV 79.2 (*)    MCH 24.9 (*)    All other components within normal limits  CBG MONITORING, ED - Abnormal; Notable for the following components:   Glucose-Capillary 227 (*)    All other components within normal limits     EKG     RADIOLOGY I independently reviewed and interpreted imaging and agree with radiologists findings.     PROCEDURES:  Critical Care performed:   Procedures   MEDICATIONS ORDERED IN ED: Medications  cefTRIAXone  (ROCEPHIN ) 2 g in sodium chloride  0.9 % 100 mL IVPB (0 g Intravenous Stopped 01/23/25 1038)  sodium chloride  0.9 % bolus 1,000 mL (0 mLs Intravenous Stopped 01/23/25 1331)  iohexol  (OMNIPAQUE ) 300 MG/ML solution 100 mL (100 mLs Intravenous Contrast Given 01/23/25 1152)  insulin  aspart (novoLOG ) injection 10 Units (10 Units Subcutaneous Given 01/23/25 1330)     IMPRESSION / MDM / ASSESSMENT AND PLAN / ED COURSE  I reviewed the triage vital signs and the nursing notes.   Differential diagnosis includes, but is not limited to, abscess, cellulitis, folliculitis, carbuncle.  Patient is awake and alert, hemodynamically stable and afebrile.  He is nontoxic in appearance.  Patient does have a open area to the inner aspect of the upper leg with mild surrounding erythema, though no fluctuance.  There is no crepitus.  There is no pain out of proportion, hemodynamic instability, or crepitus to suggest deep space infection.  There is no involvement of the perineum or scrotum.  Given patient's history of uncontrolled diabetes, further workup is indicated.  IV was established and labs were obtained.  CT with contrast also obtained to evaluate the extent of his abscess.  He was given a dose of IV Rocephin .  Labs are overall reassuring, no significant leukocytosis.  However patient does have hyperglycemia to  449.  Normal bicarb, no increased anion gap, not consistent with DKA.  He was treated with IV fluids and 10 of subcutaneous insulin .  We discussed the importance of glucose management as this will make his infection worse and cause other infections as well as other significant morbidities.  Patient agrees to follow-up with his outpatient provider this week regarding this.  In the meantime, we discussed  the options of admission for IV antibiotics versus oral antibiotic therapy.  Patient would like to try the oral antibiotic therapy.  I feel this is reasonable given his lack of hemodynamic instability, no involvement of his perineum, no fevers, no leukocytosis.  He does not meet sepsis or SIRS criteria at this time.  However, we did discuss very strict return precautions given that he is at high risk for worsening infection.  Patient understands and agrees with plan.  He was discharged in stable condition.  Ambulatory steady gait.  Patient's presentation is most consistent with acute presentation with potential threat to life or bodily function.   FINAL CLINICAL IMPRESSION(S) / ED DIAGNOSES   Final diagnoses:  Cellulitis of left lower extremity  Hyperglycemia     Rx / DC Orders   ED Discharge Orders          Ordered    doxycycline  (ADOXA) 100 MG tablet  2 times daily        01/23/25 1440    cephALEXin  (KEFLEX ) 500 MG capsule  4 times daily        01/23/25 1440             Note:  This document was prepared using Dragon voice recognition software and may include unintentional dictation errors.   Sanyah Molnar E, PA-C 01/23/25 1501    Mulvihill, Caitlin M, MD 01/23/25 1521  "

## 2025-01-23 NOTE — ED Triage Notes (Signed)
 PT complains of boil on inside of left thigh x 1 week. Getting bigger and starting to drain. Pt came the other day for same but not able to stay and wait.

## 2025-01-23 NOTE — Discharge Instructions (Signed)
 Please take the antibiotics as prescribed.  Please return to the emergency department for any new, worsening, or changing symptoms or other concerns including worsening redness, development of fever or chills, worsening pain, or any other concerns.  Please make sure that your blood sugar is under control as this will make your infection worse.
